# Patient Record
Sex: Female | Born: 1955 | Hispanic: No | Smoking: Former smoker
Health system: Southern US, Community
[De-identification: ages and names within clinical notes are randomized; demographics above are authoritative.]

## PROBLEM LIST (undated history)

## (undated) DIAGNOSIS — F32A Depression, unspecified: Secondary | ICD-10-CM

## (undated) DIAGNOSIS — R7303 Prediabetes: Secondary | ICD-10-CM

## (undated) DIAGNOSIS — E079 Disorder of thyroid, unspecified: Secondary | ICD-10-CM

## (undated) DIAGNOSIS — C50919 Malignant neoplasm of unspecified site of unspecified female breast: Secondary | ICD-10-CM

## (undated) DIAGNOSIS — F329 Major depressive disorder, single episode, unspecified: Secondary | ICD-10-CM

## (undated) DIAGNOSIS — K219 Gastro-esophageal reflux disease without esophagitis: Secondary | ICD-10-CM

## (undated) DIAGNOSIS — F419 Anxiety disorder, unspecified: Secondary | ICD-10-CM

## (undated) DIAGNOSIS — G709 Myoneural disorder, unspecified: Secondary | ICD-10-CM

## (undated) DIAGNOSIS — T7840XA Allergy, unspecified, initial encounter: Secondary | ICD-10-CM

## (undated) DIAGNOSIS — E7800 Pure hypercholesterolemia, unspecified: Secondary | ICD-10-CM

## (undated) HISTORY — DX: Myoneural disorder, unspecified: G70.9

## (undated) HISTORY — DX: Gastro-esophageal reflux disease without esophagitis: K21.9

## (undated) HISTORY — DX: Pure hypercholesterolemia, unspecified: E78.00

## (undated) HISTORY — DX: Allergy, unspecified, initial encounter: T78.40XA

## (undated) HISTORY — PX: LAPAROSCOPIC HYSTERECTOMY: SHX1926

## (undated) HISTORY — PX: ABDOMINAL HYSTERECTOMY: SHX81

## (undated) HISTORY — DX: Depression, unspecified: F32.A

## (undated) HISTORY — PX: HERNIA REPAIR: SHX51

## (undated) HISTORY — DX: Disorder of thyroid, unspecified: E07.9

## (undated) HISTORY — DX: Anxiety disorder, unspecified: F41.9

---
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
## 2016-10-07 LAB — HM MAMMOGRAPHY

## 2017-03-10 HISTORY — PX: LAPAROSCOPIC HYSTERECTOMY: SHX1926

## 2017-03-10 HISTORY — PX: ABDOMINAL HYSTERECTOMY: SHX81

## 2017-04-26 LAB — HM MAMMOGRAPHY

## 2017-06-08 HISTORY — PX: COLONOSCOPY: SHX174

## 2017-06-08 LAB — HM COLONOSCOPY

## 2017-10-20 ENCOUNTER — Encounter: Payer: Self-pay | Admitting: Family Medicine

## 2017-10-20 ENCOUNTER — Ambulatory Visit: Payer: BLUE CROSS/BLUE SHIELD | Admitting: Family Medicine

## 2017-10-20 VITALS — BP 110/64 | HR 74 | Temp 98.4°F | Resp 16 | Ht 65.0 in | Wt 152.0 lb

## 2017-10-20 DIAGNOSIS — Z8639 Personal history of other endocrine, nutritional and metabolic disease: Secondary | ICD-10-CM

## 2017-10-20 DIAGNOSIS — Z7689 Persons encountering health services in other specified circumstances: Secondary | ICD-10-CM | POA: Diagnosis not present

## 2017-10-20 DIAGNOSIS — R1031 Right lower quadrant pain: Secondary | ICD-10-CM | POA: Diagnosis not present

## 2017-10-20 LAB — POCT URINALYSIS DIP (PROADVANTAGE DEVICE)
Bilirubin, UA: NEGATIVE
Blood, UA: NEGATIVE
Glucose, UA: NEGATIVE mg/dL
Ketones, POC UA: NEGATIVE mg/dL
Leukocytes, UA: NEGATIVE
Nitrite, UA: NEGATIVE
Protein Ur, POC: NEGATIVE mg/dL
Specific Gravity, Urine: 1.03
Urobilinogen, Ur: NEGATIVE
pH, UA: 6 (ref 5.0–8.0)

## 2017-10-20 NOTE — Progress Notes (Signed)
Subjective:    Patient ID: Brittany SpillerStaci Ellers, female    DOB: June 20, 1955, 62 y.o.   MRN: [id number]  HPI Chief Complaint  Patient presents with  . right side    pelvic pain and radiates to the back. pain is on right jaw as well. been going on 3 weeks   She is new to the practice and here to establish care. Previous medical care: moved here 3 days ago from [place]. [facility] in [place], [place].   Had an OB/GYN in [place].  Has lived in [place] in the past.   CPE: November 2018   Complains of pain in her right lower quadrant for the past 3 weeks. Pain is constant but some days worse than others. Walking improves pain. States the pain feels like a "soreness" and occasionally radiates around to her right flank. Denies fever, chills, nausea, vomiting, diarrhea or constipation. No changes in bowel habits.  Nothing seems to aggravate her pain.  Aleve helps some.   After urinating she does not feel like she empties her bladder well.  No urinary frequency, urgency.   States her fear is that she has some type of lymph node cancer. She has been searching her symptoms on the internet.   States she has been told that she had hypoglycemia in the past, low blood sugar with fasting.   She has seen a neurologist in the past for toe numbness on left foot.   GERD- told she had this by an ENT, states she does not think she has this.  Complains of tonsil stones at times.   Taking nature thyroid 65 mg tablet daily. Prescription due to borderline low TSH.   Hysterectomy in 03/2017 for uterine polyps. Ovaries in tact.   Used to work for [employer]. Single. One daughter.   Reviewed allergies, medications, past medical, surgical, family, and social history.    Review of Systems Pertinent positives and negatives in the history of present illness.     Objective:   Physical Exam  Constitutional: She is oriented to person, place, and time. She appears well-developed and well-nourished. No  distress.  HENT:  Mouth/Throat: Oropharynx is clear and moist.  Eyes: Pupils are equal, round, and reactive to light. Conjunctivae are normal.  Cardiovascular: Normal rate, regular rhythm, normal heart sounds and intact distal pulses.  No LE edema   Pulmonary/Chest: Effort normal and breath sounds normal.  Abdominal: Soft. Bowel sounds are normal. She exhibits no distension and no mass. There is no hepatosplenomegaly. There is generalized tenderness. There is no rigidity, no rebound, no guarding, no CVA tenderness, no tenderness at McBurney's point and negative Murphy's sign.  Diffuse TTP to RUQ and bilateral lower abdomen without rebound or guarding   Negative psoas and obturator.   Lymphadenopathy:    She has no cervical adenopathy.    She has no axillary adenopathy.       Right: No inguinal and no supraclavicular adenopathy present.       Left: No inguinal and no supraclavicular adenopathy present.  Neurological: She is alert and oriented to person, place, and time. She has normal strength and normal reflexes. No cranial nerve deficit or sensory deficit. Gait normal.  Skin: Skin is warm and dry. Capillary refill takes less than 2 seconds. No rash noted. She is not diaphoretic. No pallor.  Psychiatric: She has a normal mood and affect. Her speech is normal and behavior is normal. Thought content normal.   BP 110/64   Pulse 74  Temp 98.4 F (36.9 C) (Oral)   Resp 16   Ht 5\' 5"  (1.651 m)   Wt 152 lb (68.9 kg)   SpO2 98%   BMI 25.29 kg/m       Assessment & Plan:  Right lower quadrant abdominal pain - Plan: CBC with Differential/Platelet, Comprehensive metabolic panel, POCT Urinalysis DIP (Proadvantage Device)  History of thyroid disorder  Encounter to establish care  Discussed that there is no obvious explanation for her symptoms. No sign of infection or systemic illness.  Urinalysis dipstick is negative.  Considered differentials including appendicitis, cholecystitis, UTI.    Recommend she call or return if any new or worsening symptoms. Will check labs and follow up.

## 2017-10-20 NOTE — Patient Instructions (Signed)
If your pain becomes more severe or if you develop fever, vomiting or any new symptoms then you should be seen again.  Ok to take Aleve occasionally for pain.   We will call you with your lab results.

## 2017-10-21 LAB — CBC WITH DIFFERENTIAL/PLATELET
Basophils Absolute: 0 10*3/uL (ref 0.0–0.2)
Basos: 0 %
EOS (ABSOLUTE): 0.1 10*3/uL (ref 0.0–0.4)
Eos: 1 %
Hematocrit: 40.9 % (ref 34.0–46.6)
Hemoglobin: 13.9 g/dL (ref 11.1–15.9)
Immature Grans (Abs): 0 10*3/uL (ref 0.0–0.1)
Immature Granulocytes: 0 %
Lymphocytes Absolute: 2.9 10*3/uL (ref 0.7–3.1)
Lymphs: 41 %
MCH: 32 pg (ref 26.6–33.0)
MCHC: 34 g/dL (ref 31.5–35.7)
MCV: 94 fL (ref 79–97)
Monocytes Absolute: 0.7 10*3/uL (ref 0.1–0.9)
Monocytes: 9 %
Neutrophils Absolute: 3.5 10*3/uL (ref 1.4–7.0)
Neutrophils: 49 %
Platelets: 345 10*3/uL (ref 150–450)
RBC: 4.34 x10E6/uL (ref 3.77–5.28)
RDW: 13.6 % (ref 12.3–15.4)
WBC: 7.1 10*3/uL (ref 3.4–10.8)

## 2017-10-21 LAB — COMPREHENSIVE METABOLIC PANEL
ALT: 16 IU/L (ref 0–32)
AST: 19 IU/L (ref 0–40)
Albumin/Globulin Ratio: 2 (ref 1.2–2.2)
Albumin: 4.6 g/dL (ref 3.6–4.8)
Alkaline Phosphatase: 78 IU/L (ref 39–117)
BUN/Creatinine Ratio: 14 (ref 12–28)
BUN: 13 mg/dL (ref 8–27)
Bilirubin Total: 0.6 mg/dL (ref 0.0–1.2)
CO2: 24 mmol/L (ref 20–29)
Calcium: 10 mg/dL (ref 8.7–10.3)
Chloride: 103 mmol/L (ref 96–106)
Creatinine, Ser: 0.95 mg/dL (ref 0.57–1.00)
GFR calc Af Amer: 75 mL/min/{1.73_m2} (ref >59)
GFR calc non Af Amer: 65 mL/min/{1.73_m2} (ref >59)
Globulin, Total: 2.3 g/dL (ref 1.5–4.5)
Glucose: 85 mg/dL (ref 65–99)
Potassium: 4.5 mmol/L (ref 3.5–5.2)
Sodium: 140 mmol/L (ref 134–144)
Total Protein: 6.9 g/dL (ref 6.0–8.5)

## 2017-10-26 ENCOUNTER — Telehealth: Payer: Self-pay | Admitting: Family Medicine

## 2017-10-26 NOTE — Telephone Encounter (Signed)
[facility]. This is a new pt whose first appt is 11/14/2017. Sending back for review.

## 2017-11-02 ENCOUNTER — Telehealth: Payer: Self-pay | Admitting: Family Medicine

## 2017-11-02 NOTE — Telephone Encounter (Signed)
Received requested records from [facility]. Sending back for review.

## 2017-11-14 ENCOUNTER — Encounter: Payer: Self-pay | Admitting: Family Medicine

## 2017-11-14 ENCOUNTER — Ambulatory Visit: Payer: BLUE CROSS/BLUE SHIELD | Admitting: Family Medicine

## 2017-11-14 VITALS — BP 128/80 | HR 72 | Ht 65.0 in | Wt 154.4 lb

## 2017-11-14 DIAGNOSIS — R7989 Other specified abnormal findings of blood chemistry: Secondary | ICD-10-CM

## 2017-11-14 DIAGNOSIS — R920 Mammographic microcalcification found on diagnostic imaging of breast: Secondary | ICD-10-CM | POA: Diagnosis not present

## 2017-11-14 DIAGNOSIS — E079 Disorder of thyroid, unspecified: Secondary | ICD-10-CM | POA: Diagnosis not present

## 2017-11-14 DIAGNOSIS — Z23 Encounter for immunization: Secondary | ICD-10-CM

## 2017-11-14 DIAGNOSIS — F419 Anxiety disorder, unspecified: Secondary | ICD-10-CM | POA: Diagnosis not present

## 2017-11-14 DIAGNOSIS — F329 Major depressive disorder, single episode, unspecified: Secondary | ICD-10-CM | POA: Diagnosis not present

## 2017-11-14 DIAGNOSIS — F32A Depression, unspecified: Secondary | ICD-10-CM

## 2017-11-14 HISTORY — DX: Disorder of thyroid, unspecified: E07.9

## 2017-11-14 NOTE — Patient Instructions (Signed)
You can call to schedule your appointment with the Counselor. A few offices are listed below for you to call.   Evette CristalKenneth Frazier, LPC [address] [place], [place] [address] [phone]  [facility] [address] [place], [place] [address] [phone]  [facility]  [address], [place], [place] [address]  Phone: [phone]  [facility] [address] [place], [place] [address]  Phone: [phone]  [facility]  [address], [place], [place] [address] Phone: [phone]

## 2017-11-14 NOTE — Progress Notes (Signed)
Subjective:    Patient ID: Brittany [name], female    DOB: [birth year], 62 y.o.   MRN: [id number]  HPI Chief Complaint  Patient presents with  . get established    get established.  aniexty issue    She is fairly new to the practice and here with complaints of worsening anxiety over the past year. 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 When not in the car her anxiety is manageable.  Denies history of trauma or MVA. She is not sure as to why this started happening.   Denies history of depression. Reports going through menopause, moving and losing her job all in the past few months.  In the past she took wellbutrin for what she recalls as "irritability" but this gave her hives. Denies any other psychotropic medications in the past.  Has not been to a therapist in the past. She is interested in CBT. She requests Luan MooreKenneth Frazier's information specifically.   Records received from [facility] in [place].   Low TSH in November 2018 per records. She is taking a thyroid supplement. Reports history of hypothyroidism since ?2013.    States she used to exercise more. This helps her mood usually.    She saw a neurologist in the past for toe numbness. States she had a glucose challenge and was told she was hypoglycemia. Her records actually indicate history of hyperglycemia and elevated Hgb A1c.   Hysterectomy in November 2018 for endometrial polyps and postmenopausal bleeding. Also diagnosed uterine fibriods and with cervical stenosis per records.  Periods stopped in her mid 5450s.   States she needs to have a diagnostic mammogram per breast center in [place]. She would like for me to order this.   She requests to have the shingles vaccine. Had the Zostavax vaccine years ago. States she does not need to check with her insurance company, she wants it even if it is hundreds of dollars.   Denies fever, chills, dizziness, chest pain, palpitations, shortness of  breath, abdominal pain, N/V/D, urinary symptoms, LE edema.    Depression screen [facility]9 11/14/2017 10/20/2017  Decreased Interest 3 3  Down, Depressed, Hopeless 3 3  PHQ - 2 Score 6 6  Altered sleeping 0 0  Tired, decreased energy 3 3  Change in appetite 1 0  Feeling bad or failure about yourself  1 0  Trouble concentrating 3 1  Moving slowly or fidgety/restless 1 0  Suicidal thoughts 0 0  PHQ-9 Score 15 10  Difficult doing work/chores Not difficult at all Not difficult at all   Reviewed allergies, medications, past medical, surgical, family, and social history.   Review of Systems Pertinent positives and negatives in the history of present illness.     Objective:   Physical Exam BP 128/80   Pulse 72   Ht 5\' 5"  (1.651 m)   Wt 154 lb 6.4 oz (70 kg)   BMI 25.69 kg/m   Alert and in no distress. Pharyngeal area is normal. Neck is supple without adenopathy or thyromegaly. Cardiac exam shows a regular sinus rhythm without murmurs or gallops. Lungs are clear to auscultation.      Assessment & Plan:  Anxiety and depression - Plan: TSH, T4, free  Abnormal mammogram with microcalcification - Plan: MM DIAG BREAST TOMO BILATERAL, CANCELED: MM Digital Diagnostic Bilat  Thyroid disorder - Plan: TSH, T4, free  Low TSH level - Plan: TSH, T4, free  Need for shingles  vaccine - Plan: Varicella-zoster vaccine IM  She appears to have worsening anxiety and depression.  Declines medication.  She is interested in starting therapy.  A list of names and numbers were given to her.  She does not appear to be in any danger to herself or others. History of hypothyroidism with a low TSH per lab results from [place].  She is currently taking a thyroid hormone supplement.  Check TSH and free T4 today. Counseling done on shingles vaccine.  Recommend that she call and check with her insurance company regarding her financial responsibility.  She declines to call the insurance company and states she wants  the vaccine regardless of the expense.  Shingrix was given today. She will be due for a fasting CPE in mid to late November per patient.  At that time we will check a hemoglobin A1c due to history of elevated A1c per records.

## 2017-11-15 ENCOUNTER — Encounter: Payer: Self-pay | Admitting: Family Medicine

## 2017-11-15 LAB — TSH: TSH: 1.04 u[IU]/mL (ref 0.450–4.500)

## 2017-11-15 LAB — T4, FREE: Free T4: 1.15 ng/dL (ref 0.82–1.77)

## 2017-11-21 ENCOUNTER — Encounter: Payer: Self-pay | Admitting: Family Medicine

## 2017-11-23 ENCOUNTER — Encounter: Payer: Self-pay | Admitting: Family Medicine

## 2017-12-19 DIAGNOSIS — F4322 Adjustment disorder with anxiety: Secondary | ICD-10-CM | POA: Diagnosis not present

## 2017-12-22 ENCOUNTER — Encounter: Payer: Self-pay | Admitting: Family Medicine

## 2017-12-22 ENCOUNTER — Telehealth: Payer: Self-pay | Admitting: Family Medicine

## 2017-12-22 ENCOUNTER — Ambulatory Visit: Payer: BLUE CROSS/BLUE SHIELD | Admitting: Family Medicine

## 2017-12-22 VITALS — BP 120/80 | HR 71 | Wt 156.0 lb

## 2017-12-22 DIAGNOSIS — F329 Major depressive disorder, single episode, unspecified: Secondary | ICD-10-CM

## 2017-12-22 DIAGNOSIS — F419 Anxiety disorder, unspecified: Secondary | ICD-10-CM

## 2017-12-22 DIAGNOSIS — F32A Depression, unspecified: Secondary | ICD-10-CM

## 2017-12-22 MED ORDER — FLUOXETINE HCL 20 MG PO TABS: 20.0000 mg | ORAL_TABLET | Freq: Every day | ORAL | 1 refills | Status: DC

## 2017-12-22 NOTE — Telephone Encounter (Signed)
Thanks

## 2017-12-22 NOTE — Patient Instructions (Addendum)
Start daily Prozac 10 mg x one week. Then if doing well, increase to 20 mg daily.  Return in 2 weeks for follow up.     Fluoxetine capsules or tablets (Depression/Mood Disorders) What is this medicine? FLUOXETINE (floo OX e teen) belongs to a class of drugs known as selective serotonin reuptake inhibitors (SSRIs). It helps to treat mood problems such as depression, obsessive compulsive disorder, and panic attacks. It can also treat certain eating disorders. This medicine may be used for other purposes; ask your health care provider or pharmacist if you have questions. COMMON BRAND NAME(S): Prozac What should I tell my health care provider before I take this medicine? They need to know if you have any of these conditions: -bipolar disorder or a family history of bipolar disorder -bleeding disorders -glaucoma -heart disease -liver disease -low levels of sodium in the blood -seizures -suicidal thoughts, plans, or attempt; a previous suicide attempt by you or a family member -take MAOIs like Carbex, Eldepryl, Marplan, Nardil, and Parnate -take medicines that treat or prevent blood clots -thyroid disease -an unusual or allergic reaction to fluoxetine, other medicines, foods, dyes, or preservatives -pregnant or trying to get pregnant -breast-feeding How should I use this medicine? Take this medicine by mouth with a glass of water. Follow the directions on the prescription label. You can take this medicine with or without food. Take your medicine at regular intervals. Do not take it more often than directed. Do not stop taking this medicine suddenly except upon the advice of your doctor. Stopping this medicine too quickly may cause serious side effects or your condition may worsen. A special MedGuide will be given to you by the pharmacist with each prescription and refill. Be sure to read this information carefully each time. Talk to your pediatrician regarding the use of this medicine in  children. While this drug may be prescribed for children as young as 7 years for selected conditions, precautions do apply. Overdosage: If you think you have taken too much of this medicine contact a poison control center or emergency room at once. NOTE: This medicine is only for you. Do not share this medicine with others. What if I miss a dose? If you miss a dose, skip the missed dose and go back to your regular dosing schedule. Do not take double or extra doses. What may interact with this medicine? Do not take this medicine with any of the following medications: -other medicines containing fluoxetine, like Sarafem or Symbyax -cisapride -linezolid -MAOIs like Carbex, Eldepryl, Marplan, Nardil, and Parnate -methylene blue (injected into a vein) -pimozide -thioridazine This medicine may also interact with the following medications: -alcohol -amphetamines -aspirin and aspirin-like medicines -carbamazepine -certain medicines for depression, anxiety, or psychotic disturbances -certain medicines for migraine headaches like almotriptan, eletriptan, frovatriptan, naratriptan, rizatriptan, sumatriptan, zolmitriptan -digoxin -diuretics -fentanyl -flecainide -furazolidone -isoniazid -lithium -medicines for sleep -medicines that treat or prevent blood clots like warfarin, enoxaparin, and dalteparin -NSAIDs, medicines for pain and inflammation, like ibuprofen or naproxen -phenytoin -procarbazine -propafenone -rasagiline -ritonavir -supplements like St. John's wort, kava kava, valerian -tramadol -tryptophan -vinblastine This list may not describe all possible interactions. Give your health care provider a list of all the medicines, herbs, non-prescription drugs, or dietary supplements you use. Also tell them if you smoke, drink alcohol, or use illegal drugs. Some items may interact with your medicine. What should I watch for while using this medicine? Tell your doctor if your symptoms  do not get better or if they get  worse. Visit your doctor or health care professional for regular checks on your progress. Because it may take several weeks to see the full effects of this medicine, it is important to continue your treatment as prescribed by your doctor. Patients and their families should watch out for new or worsening thoughts of suicide or depression. Also watch out for sudden changes in feelings such as feeling anxious, agitated, panicky, irritable, hostile, aggressive, impulsive, severely restless, overly excited and hyperactive, or not being able to sleep. If this happens, especially at the beginning of treatment or after a change in dose, call your health care professional. Bonita QuinYou may get drowsy or dizzy. Do not drive, use machinery, or do anything that needs mental alertness until you know how this medicine affects you. Do not stand or sit up quickly, especially if you are an older patient. This reduces the risk of dizzy or fainting spells. Alcohol may interfere with the effect of this medicine. Avoid alcoholic drinks. Your mouth may get dry. Chewing sugarless gum or sucking hard candy, and drinking plenty of water may help. Contact your doctor if the problem does not go away or is severe. This medicine may affect blood sugar levels. If you have diabetes, check with your doctor or health care professional before you change your diet or the dose of your diabetic medicine. What side effects may I notice from receiving this medicine? Side effects that you should report to your doctor or health care professional as soon as possible: -allergic reactions like skin rash, itching or hives, swelling of the face, lips, or tongue -anxious -black, tarry stools -breathing problems -changes in vision -confusion -elevated mood, decreased need for sleep, racing thoughts, impulsive behavior -eye pain -fast, irregular heartbeat -feeling faint or lightheaded, falls -feeling agitated, angry, or  irritable -hallucination, loss of contact with reality -loss of balance or coordination -loss of memory -painful or prolonged erections -restlessness, pacing, inability to keep still -seizures -stiff muscles -suicidal thoughts or other mood changes -trouble sleeping -unusual bleeding or bruising -unusually weak or tired -vomiting Side effects that usually do not require medical attention (report to your doctor or health care professional if they continue or are bothersome): -change in appetite or weight -change in sex drive or performance -diarrhea -dry mouth -headache -increased sweating -nausea -tremors This list may not describe all possible side effects. Call your doctor for medical advice about side effects. You may report side effects to FDA at 1-800-FDA-1088. Where should I keep my medicine? Keep out of the reach of children. Store at room temperature between 15 and 30 degrees C (59 and 86 degrees F). Throw away any unused medicine after the expiration date. NOTE: This sheet is a summary. It may not cover all possible information. If you have questions about this medicine, talk to your doctor, pharmacist, or health care provider.  2018 Elsevier/Gold Standard (2015-09-27 15:55:27)

## 2017-12-22 NOTE — Telephone Encounter (Signed)
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 Called her back this afternoon, she has sent them out to be mailed.  She said it would prob be middle of next week before we received them  [facility]  Called pt reached voice mail. Left details of above.  I will call patient once I have them in my hands.

## 2017-12-22 NOTE — Progress Notes (Signed)
   Subjective:    Patient ID: Brittany SpillerStaci [name], female    DOB: 27-Jul-1955, 62 y.o.   MRN: [id number]  HPI Chief Complaint  Patient presents with  . prozac    saw therapist monday and recommends getting on prozac 20mg    She is here requesting medication for anxiety and depression. She has been hesitant to start on medication for this.  She recently saw Evette CristalKenneth Frazier, therapist, for her symptoms and he recommends she start on Prozac. She is following up with him in 2 weeks.  Denies SI or HI.   Denies fever, chills, chest pain, shortness of breath, abdominal pain, N/V/D.   Reviewed allergies, medications, past medical, surgical, family, and social history.   Review of Systems Pertinent positives and negatives in the history of present illness.     Objective:   Physical Exam BP 120/80   Pulse 71   Wt 156 lb (70.8 kg)   BMI 25.96 kg/m   Alert and oriented and in no acute distress.  Not otherwise examined.      Assessment & Plan:  Anxiety and depression - Plan: FLUoxetine (PROZAC) 20 MG tablet  Discussed potential side effects and interactions with medications and supplements associated with Prozac. Advised her to avoid taking any new OTC herbs or supplements without first checking with her pharmacist. She will start on 10 mg x 1 week and if doing well increase to 20 mg. Follow up with me in 2 weeks. Continue seeing her counselor.

## 2017-12-28 ENCOUNTER — Telehealth: Payer: Self-pay | Admitting: Family Medicine

## 2017-12-28 ENCOUNTER — Encounter: Payer: Self-pay | Admitting: Family Medicine

## 2017-12-28 NOTE — Telephone Encounter (Signed)
Called pt reached voice mail lmtrc.  We have received films and reports from [facility]. Patient needs to pick up.

## 2017-12-30 ENCOUNTER — Encounter: Payer: Self-pay | Admitting: Family Medicine

## 2018-01-04 ENCOUNTER — Ambulatory Visit
Admission: RE | Admit: 2018-01-04 | Discharge: 2018-01-04 | Disposition: A | Discharge status: Home / Self Care | Payer: BLUE CROSS/BLUE SHIELD | Source: Ambulatory Visit | Attending: Family Medicine | Admitting: Family Medicine

## 2018-01-04 DIAGNOSIS — R921 Mammographic calcification found on diagnostic imaging of breast: Secondary | ICD-10-CM | POA: Diagnosis not present

## 2018-01-04 DIAGNOSIS — R920 Mammographic microcalcification found on diagnostic imaging of breast: Secondary | ICD-10-CM

## 2018-01-05 ENCOUNTER — Encounter: Payer: Self-pay | Admitting: Family Medicine

## 2018-01-05 ENCOUNTER — Ambulatory Visit: Payer: BLUE CROSS/BLUE SHIELD | Admitting: Family Medicine

## 2018-01-05 VITALS — BP 120/78 | HR 63 | Wt 155.6 lb

## 2018-01-05 DIAGNOSIS — F419 Anxiety disorder, unspecified: Secondary | ICD-10-CM

## 2018-01-05 DIAGNOSIS — F32A Depression, unspecified: Secondary | ICD-10-CM

## 2018-01-05 DIAGNOSIS — F329 Major depressive disorder, single episode, unspecified: Secondary | ICD-10-CM

## 2018-01-05 DIAGNOSIS — F4322 Adjustment disorder with anxiety: Secondary | ICD-10-CM | POA: Diagnosis not present

## 2018-01-05 NOTE — Progress Notes (Signed)
   Subjective:    Patient ID: Brittany SpillerStaci [name], female    DOB: 01-17-1956, 62 y.o.   MRN: [id number]  HPI Chief Complaint  Patient presents with  . 2 week follow-up    2 week follow-up. haven't had any problems. doing well.    She is here to follow-up on anxiety and depression and starting on new medication Prozac 2 weeks ago. Reports doing well on the medication.  She is now on the 20 mg daily dosing.  States she has noticed that her thoughts are not racing and she is able to focus more. she is seeing her counselor later today Feels as though she is benefiting from the medication and would like to stay on it.  She did have a rash last week that presented after doing hot yoga so she took antihistamines for a couple of days and the rash resolved. Denies fever, chills, headache, chest pain, abdominal pain, nausea, vomiting, diarrhea.  No insomnia.  Appetite is good.  Reviewed allergies, medications, past medical, surgical, family, and social history.   Review of Systems Pertinent positives and negatives in the history of present illness.     Objective:   Physical Exam BP 120/78   Pulse 63   Wt 155 lb 9.6 oz (70.6 kg)   BMI 25.89 kg/m   Alert and oriented and in no acute distress.  Not otherwise examined.      Assessment & Plan:  Anxiety and depression  Appears to be benefiting from Prozac and we will keep her on this at the 20 mg dose.  No obvious side effects.  She will continue seeing her counselor.

## 2018-01-16 ENCOUNTER — Other Ambulatory Visit (INDEPENDENT_AMBULATORY_CARE_PROVIDER_SITE_OTHER): Payer: BLUE CROSS/BLUE SHIELD

## 2018-01-16 DIAGNOSIS — Z23 Encounter for immunization: Secondary | ICD-10-CM | POA: Diagnosis not present

## 2018-02-06 DIAGNOSIS — F4322 Adjustment disorder with anxiety: Secondary | ICD-10-CM | POA: Diagnosis not present

## 2018-03-02 ENCOUNTER — Ambulatory Visit: Payer: BLUE CROSS/BLUE SHIELD | Admitting: Family Medicine

## 2018-03-02 ENCOUNTER — Encounter: Payer: Self-pay | Admitting: Family Medicine

## 2018-03-02 VITALS — BP 102/70 | HR 59 | Temp 98.3°F | Resp 16 | Wt 155.4 lb

## 2018-03-02 DIAGNOSIS — H1033 Unspecified acute conjunctivitis, bilateral: Secondary | ICD-10-CM | POA: Diagnosis not present

## 2018-03-02 MED ORDER — POLYMYXIN B-TRIMETHOPRIM 10000-0.1 UNIT/ML-% OP SOLN: 1.0000 [drp] | OPHTHALMIC | 0 refills | Status: DC

## 2018-03-02 NOTE — Patient Instructions (Signed)

## 2018-03-02 NOTE — Progress Notes (Signed)
Subjective:  Brittany [name] is a 62 y.o. female who presents for a 3-day history of red eyes, itching and watery discharge with some mild left upper and lower lid tenderness.  Denies eye pain, vision changes or foreign body sensation.  Denies fever, chills, headache, dizziness, rhinorrhea, nasal congestion, sore throat. She does not wear contact lenses  Treatment to date: saline eye drops.  Denies sick contacts.  No other aggravating or relieving factors.  No other c/o.  ROS as in subjective.   Objective: Vitals:   03/02/18 1331  BP: 102/70  Pulse: (!) 59  Resp: 16  Temp: 98.3 F (36.8 C)  SpO2: 98%    General appearance: Alert, WD/WN, no distress, mildly ill appearing                             Skin: warm, no rash                           Head: no sinus tenderness                            Eyes: conjunctiva red L>R, clear drainage bilaterally, mild lid edema, corneas clear, PERRLA, EOMs intact                            Ears: pearly TMs, external ear canals normal                          Nose: septum midline, normal nose, no discharge             Mouth/throat: MMM, tongue normal, pharyngeal normal                           Neck: supple, no adenopathy, no thyromegaly, nontender                                Assessment: Acute conjunctivitis of both eyes, unspecified acute conjunctivitis type - Plan: trimethoprim-polymyxin b (POLYTRIM) ophthalmic solution    Plan: She was seen for bilateral conjunctivitis and we will treat with Polytrim.  No red flag symptoms.  Discussed using cool or warm compresses and doing a lot of handwashing.  Discussed the contagious nature.  She does not wear contact lenses.  Advised her to throw away her eye make-up and buy new items.  Follow-up if symptoms are worsening or not back to baseline in 1 week.

## 2018-03-16 DIAGNOSIS — F4322 Adjustment disorder with anxiety: Secondary | ICD-10-CM | POA: Diagnosis not present

## 2018-03-22 ENCOUNTER — Encounter: Payer: Self-pay | Admitting: Family Medicine

## 2018-03-22 ENCOUNTER — Ambulatory Visit (INDEPENDENT_AMBULATORY_CARE_PROVIDER_SITE_OTHER): Payer: BLUE CROSS/BLUE SHIELD | Admitting: Family Medicine

## 2018-03-22 VITALS — BP 110/70 | HR 54 | Ht 65.0 in | Wt 153.8 lb

## 2018-03-22 DIAGNOSIS — F32A Depression, unspecified: Secondary | ICD-10-CM

## 2018-03-22 DIAGNOSIS — F329 Major depressive disorder, single episode, unspecified: Secondary | ICD-10-CM

## 2018-03-22 DIAGNOSIS — Z13220 Encounter for screening for lipoid disorders: Secondary | ICD-10-CM | POA: Diagnosis not present

## 2018-03-22 DIAGNOSIS — F419 Anxiety disorder, unspecified: Secondary | ICD-10-CM | POA: Diagnosis not present

## 2018-03-22 DIAGNOSIS — Z0000 Encounter for general adult medical examination without abnormal findings: Secondary | ICD-10-CM | POA: Diagnosis not present

## 2018-03-22 DIAGNOSIS — L989 Disorder of the skin and subcutaneous tissue, unspecified: Secondary | ICD-10-CM

## 2018-03-22 LAB — POCT URINALYSIS DIP (PROADVANTAGE DEVICE)
Bilirubin, UA: NEGATIVE
Blood, UA: NEGATIVE
Glucose, UA: NEGATIVE mg/dL
Ketones, POC UA: NEGATIVE mg/dL
Leukocytes, UA: NEGATIVE
Nitrite, UA: NEGATIVE
Protein Ur, POC: NEGATIVE mg/dL
Specific Gravity, Urine: 1.01
Urobilinogen, Ur: NEGATIVE
pH, UA: 7.5 (ref 5.0–8.0)

## 2018-03-22 LAB — LIPID PANEL
Chol/HDL Ratio: 3 ratio (ref 0.0–4.4)
Cholesterol, Total: 206 mg/dL — ABNORMAL HIGH (ref 100–199)
HDL: 69 mg/dL (ref >39)
LDL Calculated: 109 mg/dL — ABNORMAL HIGH (ref 0–99)
Triglycerides: 140 mg/dL (ref 0–149)
VLDL Cholesterol Cal: 28 mg/dL (ref 5–40)

## 2018-03-22 NOTE — Addendum Note (Signed)
Addended by: Herminio CommonsJOHNSON, SABRINA A on: 03/22/2018 10:40 AM   Modules accepted: Orders

## 2018-03-22 NOTE — Progress Notes (Signed)
Subjective:    Patient ID: Brittany [name], female    DOB: 1955/05/14, 62 y.o.   MRN: [id number]  HPI Chief Complaint  Patient presents with  . cpe    cpe, had coffee with creme this morning.    She is here for a complete physical exam. Previous medical care: moved from [place] in June 2019 Last CPE: November 2018  Right inner thigh with a red small flat area non pruritic for the past 2 months.   Taking nature thyroid 65 mg tablet daily. Prescription due to borderline low TSH.   Hysterectomy in 03/2017 for uterine polyps. Ovaries in tact.  Used to work for [employer]. Single. One daughter.   Social history: Lives alone, not working currently  Drinks alcohol once weekly. No drug use. Smoked for 5 years in her early 32s.  Diet: fairly unhealthy now  Excerise: 3-4 days per week   Immunizations: UTD   Health maintenance:  Mammogram: 12/2017 Colonoscopy: 05/2017 Last Gynecological Exam:  Last Menstrual cycle: hysterectomy  Last Dental Exam: twice annually  Last Eye Exam: overdue   Hep C and HIV both negative   Wears seatbelt always, uses sunscreen, smoke detectors in home and functioning, does not text while driving and feels safe in home environment.   Reviewed allergies, medications, past medical, surgical, family, and social history.    Review of Systems Review of Systems Constitutional: -fever, -chills, -sweats, -unexpected weight change,-fatigue ENT: -runny nose, -ear pain, -sore throat Cardiology:  -chest pain, -palpitations, -edema Respiratory: -cough, -shortness of breath, -wheezing Gastroenterology: -abdominal pain, -nausea, -vomiting, -diarrhea, -constipation  Hematology: -bleeding or bruising problems Musculoskeletal: -arthralgias, -myalgias, -joint swelling, -back pain Ophthalmology: -vision changes Urology: -dysuria, -difficulty urinating, -hematuria, -urinary frequency, -urgency Neurology: -headache, -weakness, -tingling, -numbness         Objective:   Physical Exam BP 110/70   Pulse (!) 54   Ht 5\' 5"  (1.651 m)   Wt 153 lb 12.8 oz (69.8 kg)   SpO2 97%   BMI 25.59 kg/m   General Appearance:    Alert, cooperative, no distress, appears stated age  Head:    Normocephalic, without obvious abnormality, atraumatic  Eyes:    PERRL, conjunctiva/corneas clear, EOM's intact, fundi    benign  Ears:    Normal TM's and external ear canals  Nose:   Nares normal, mucosa normal, no drainage or sinus   tenderness  Throat:   Lips, mucosa, and tongue normal; teeth and gums normal  Neck:   Supple, no lymphadenopathy;  thyroid:  no   enlargement/tenderness/nodules; no carotid   bruit or JVD  Back:    Spine nontender, no curvature, ROM normal, no CVA     tenderness  Lungs:     Clear to auscultation bilaterally without wheezes, rales or     ronchi; respirations unlabored  Chest Wall:    No tenderness or deformity   Heart:    Regular rate and rhythm, S1 and S2 normal, no murmur, rub   or gallop  Breast Exam:    Declines. Mammogram UTD  Abdomen:     Soft, non-tender, nondistended, normoactive bowel sounds,    no masses, no hepatosplenomegaly  Genitalia:   declines. Hysterectomy   Rectal:   not done   Extremities:   No clubbing, cyanosis or edema  Pulses:   2+ and symmetric all extremities  Skin:   Skin color, texture, turgor normal, no rashes. 1 cm round area of erythema, mildly raised without fluctuance   Lymph nodes:  Cervical, supraclavicular, and axillary nodes normal  Neurologic:   CNII-XII intact, normal strength, sensation and gait; reflexes 2+ and symmetric throughout          Psych:   Normal mood, affect, hygiene and grooming.     Urinalysis dipstick: negative       Assessment & Plan:  Routine general medical examination at a health care facility - Plan: Lipid panel  Anxiety and depression  Skin lesion of right leg  Screening for lipid disorders - Plan: Lipid panel  She appears to be doing well physically and  reports her mood is much improved with Prozac. She would like to continue on this.  Skin lesion is benign appearing. She may try hydrocortisone and follow up if she notices the area changing or enlarging.  Counseling done on healthy diet and exercise.  Immunizations are UTD.  Reports having a negative HIV and hepatitis C screening test at [place] previous PCP in [place]. Results not available currently.  Mammogram and colonoscopy UTD.  Follow up pending lipid panel.

## 2018-03-22 NOTE — Patient Instructions (Signed)
Here is a list of Eye Doctors that you call and schedule an appointment with.   [facility] Address: [address] [place], [place] [address] Phone: [phone]   [facility] Address: [address], [place], [place] [address] Phone: [phone]  Dr. Dione Booze Address: [address] [place], [place] [address] Phone: [phone]  [facility] [address] [place], [place]  [address] Telephone: [phone]  Preventative Care for Adults - Female      MAINTAIN REGULAR HEALTH EXAMS:  A routine yearly physical is a good way to check in with your primary care provider about your health and preventive screening. It is also an opportunity to share updates about your health and any concerns you have, and receive a thorough all-over exam.   Most health insurance companies pay for at least some preventative services.  Check with your health plan for specific coverages.  WHAT PREVENTATIVE SERVICES DO WOMEN NEED?  Adult women should have their weight and blood pressure checked regularly.   Women age 84 and older should have their cholesterol levels checked regularly.  Women should be screened for cervical cancer with a Pap smear and pelvic exam beginning at age 63.  Breast cancer screening generally begins at age 65 with a mammogram and breast exam by your primary care provider.    Beginning at age 45 and continuing to age 39, women should be screened for colorectal cancer.  Certain people may need continued testing until age 52.  Updating vaccinations is part of preventative care.  Vaccinations help protect against diseases such as the flu.  Osteoporosis is a disease in which the bones lose minerals and strength as we age. Women ages 54 and over should discuss this with their caregivers, as should women after menopause who have other risk factors.  Lab tests are generally done as part of preventative care to screen for anemia and blood disorders,  to screen for problems with the kidneys and liver, to screen for bladder problems, to check blood sugar, and to check your cholesterol level.  Preventative services generally include counseling about diet, exercise, avoiding tobacco, drugs, excessive alcohol consumption, and sexually transmitted infections.    GENERAL RECOMMENDATIONS FOR GOOD HEALTH:  Healthy diet:  Eat a variety of foods, including fruit, vegetables, animal or vegetable protein, such as meat, fish, chicken, and eggs, or beans, lentils, tofu, and grains, such as rice.  Drink plenty of water daily.  Decrease saturated fat in the diet, avoid lots of red meat, processed foods, sweets, fast foods, and fried foods.  Exercise:  Aerobic exercise helps maintain good heart health. At least 30-40 minutes of moderate-intensity exercise is recommended. For example, a brisk walk that increases your heart rate and breathing. This should be done on most days of the week.   Find a type of exercise or a variety of exercises that you enjoy so that it becomes a part of your daily life.  Examples are running, walking, swimming, water aerobics, and biking.  For motivation and support, explore group exercise such as aerobic class, spin class, Zumba, Yoga,or  martial arts, etc.    Set exercise goals for yourself, such as a certain weight goal, walk or run in a race such as a 5k walk/run.  Speak to your primary care provider about exercise goals.  Disease prevention:  If you smoke or chew tobacco, find out from your caregiver how to quit. It can literally save your  life, no matter how long you have been a tobacco user. If you do not use tobacco, never begin.   Maintain a healthy diet and normal weight. Increased weight leads to problems with blood pressure and diabetes.   The Body Mass Index or BMI is a way of measuring how much of your body is fat. Having a BMI above 27 increases the risk of heart disease, diabetes, hypertension, stroke and  other problems related to obesity. Your caregiver can help determine your BMI and based on it develop an exercise and dietary program to help you achieve or maintain this important measurement at a healthful level.  High blood pressure causes heart and blood vessel problems.  Persistent high blood pressure should be treated with medicine if weight loss and exercise do not work.   Fat and cholesterol leaves deposits in your arteries that can block them. This causes heart disease and vessel disease elsewhere in your body.  If your cholesterol is found to be high, or if you have heart disease or certain other medical conditions, then you may need to have your cholesterol monitored frequently and be treated with medication.   Ask if you should have a cardiac stress test if your history suggests this. A stress test is a test done on a treadmill that looks for heart disease. This test can find disease prior to there being a problem.  Menopause can be associated with physical symptoms and risks. Hormone replacement therapy is available to decrease these. You should talk to your caregiver about whether starting or continuing to take hormones is right for you.   Osteoporosis is a disease in which the bones lose minerals and strength as we age. This can result in serious bone fractures. Risk of osteoporosis can be identified using a bone density scan. Women ages 53 and over should discuss this with their caregivers, as should women after menopause who have other risk factors. Ask your caregiver whether you should be taking a calcium supplement and Vitamin D, to reduce the rate of osteoporosis.   Avoid drinking alcohol in excess (more than two drinks per day).  Avoid use of street drugs. Do not share needles with anyone. Ask for professional help if you need assistance or instructions on stopping the use of alcohol, cigarettes, and/or drugs.  Brush your teeth twice a day with fluoride toothpaste, and floss once a  day. Good oral hygiene prevents tooth decay and gum disease. The problems can be painful, unattractive, and can cause other health problems. Visit your dentist for a routine oral and dental check up and preventive care every 6-12 months.   Look at your skin regularly.  Use a mirror to look at your back. Notify your caregivers of changes in moles, especially if there are changes in shapes, colors, a size larger than a pencil eraser, an irregular border, or development of new moles.  Safety:  Use seatbelts 100% of the time, whether driving or as a passenger.  Use safety devices such as hearing protection if you work in environments with loud noise or significant background noise.  Use safety glasses when doing any work that could send debris in to the eyes.  Use a helmet if you ride a bike or motorcycle.  Use appropriate safety gear for contact sports.  Talk to your caregiver about gun safety.  Use sunscreen with a SPF (or skin protection factor) of 15 or greater.  Lighter skinned people are at a greater risk of skin cancer. Don't  forget to also wear sunglasses in order to protect your eyes from too much damaging sunlight. Damaging sunlight can accelerate cataract formation.   Practice safe sex. Use condoms. Condoms are used for birth control and to help reduce the spread of sexually transmitted infections (or STIs).  Some of the STIs are gonorrhea (the clap), chlamydia, syphilis, trichomonas, herpes, HPV (human papilloma virus) and HIV (human immunodeficiency virus) which causes AIDS. The herpes, HIV and HPV are viral illnesses that have no cure. These can result in disability, cancer and death.   Keep carbon monoxide and smoke detectors in your home functioning at all times. Change the batteries every 6 months or use a model that plugs into the wall.   Vaccinations:  Stay up to date with your tetanus shots and other required immunizations. You should have a booster for tetanus every 10 years. Be sure  to get your flu shot every year, since 5%-20% of the [place] population comes down with the flu. The flu vaccine changes each year, so being vaccinated once is not enough. Get your shot in the fall, before the flu season peaks.   Other vaccines to consider:  Human Papilloma Virus or HPV causes cancer of the cervix, and other infections that can be transmitted from person to person. There is a vaccine for HPV, and females should get immunized between the ages of 9011 and 6326. It requires a series of 3 shots.   Pneumococcal vaccine to protect against certain types of pneumonia.  This is normally recommended for adults age 60 or older.  However, adults younger than 60 years old with certain underlying conditions such as diabetes, heart or lung disease should also receive the vaccine.  Shingles vaccine to protect against Varicella Zoster if you are older than age 60, or younger than 60 years old with certain underlying illness.  Hepatitis A vaccine to protect against a form of infection of the liver by a virus acquired from food.  Hepatitis B vaccine to protect against a form of infection of the liver by a virus acquired from blood or body fluids, particularly if you work in health care.  If you plan to travel internationally, check with your local health department for specific vaccination recommendations.  Cancer Screening:  Breast cancer screening is essential to preventive care for women. All women age 60 and older should perform a breast self-exam every month. At age 60 and older, women should have their caregiver complete a breast exam each year. Women at ages 5440 and older should have a mammogram (x-ray film) of the breasts. Your caregiver can discuss how often you need mammograms.    Cervical cancer screening includes taking a Pap smear (sample of cells examined under a microscope) from the cervix (end of the uterus). It also includes testing for HPV (Human Papilloma Virus, which can cause cervical  cancer). Screening and a pelvic exam should begin at age 60, or 3 years after a woman becomes sexually active. Screening should occur every year, with a Pap smear but no HPV testing, up to age 930. After age 60, you should have a Pap smear every 3 years with HPV testing, if no HPV was found previously.   Most routine colon cancer screening begins at the age of 60. On a yearly basis, doctors may provide special easy to use take-home tests to check for hidden blood in the stool. Sigmoidoscopy or colonoscopy can detect the earliest forms of colon cancer and is life saving. These tests use  a small camera at the end of a tube to directly examine the colon. Speak to your caregiver about this at age 90, when routine screening begins (and is repeated every 5 years unless early forms of pre-cancerous polyps or small growths are found).

## 2018-04-03 ENCOUNTER — Other Ambulatory Visit: Payer: Self-pay

## 2018-04-03 MED ORDER — FLUOXETINE HCL 20 MG PO TABS: 20.0000 mg | ORAL_TABLET | Freq: Every day | ORAL | 1 refills | Status: DC

## 2018-04-03 NOTE — Telephone Encounter (Signed)
Patient lmom stating she needs a refill on the pending medication.

## 2018-04-03 NOTE — Telephone Encounter (Signed)
Patient called back and stated [facility] Please advise change.

## 2018-04-03 NOTE — Telephone Encounter (Signed)
Please take care of this.  

## 2018-04-05 DIAGNOSIS — H01021 Squamous blepharitis right upper eyelid: Secondary | ICD-10-CM | POA: Diagnosis not present

## 2018-04-05 DIAGNOSIS — H01022 Squamous blepharitis right lower eyelid: Secondary | ICD-10-CM | POA: Diagnosis not present

## 2018-04-05 DIAGNOSIS — H2513 Age-related nuclear cataract, bilateral: Secondary | ICD-10-CM | POA: Diagnosis not present

## 2018-04-05 DIAGNOSIS — H10413 Chronic giant papillary conjunctivitis, bilateral: Secondary | ICD-10-CM | POA: Diagnosis not present

## 2018-04-05 MED ORDER — FLUOXETINE HCL 20 MG PO TABS: 20.0000 mg | ORAL_TABLET | Freq: Every day | ORAL | 1 refills | Status: DC

## 2018-04-05 NOTE — Addendum Note (Signed)
Addended by: Victorio PalmJENKINS, Eithel Ryall on: 04/05/2018 09:55 AM   Modules accepted: Orders

## 2018-04-05 NOTE — Telephone Encounter (Signed)
[facility].

## 2018-04-24 DIAGNOSIS — F4322 Adjustment disorder with anxiety: Secondary | ICD-10-CM | POA: Diagnosis not present

## 2018-05-31 DIAGNOSIS — F4322 Adjustment disorder with anxiety: Secondary | ICD-10-CM | POA: Diagnosis not present

## 2018-06-30 DIAGNOSIS — F4322 Adjustment disorder with anxiety: Secondary | ICD-10-CM | POA: Diagnosis not present

## 2018-08-11 DIAGNOSIS — F4322 Adjustment disorder with anxiety: Secondary | ICD-10-CM | POA: Diagnosis not present

## 2018-09-15 DIAGNOSIS — F4322 Adjustment disorder with anxiety: Secondary | ICD-10-CM | POA: Diagnosis not present

## 2018-10-13 DIAGNOSIS — F4322 Adjustment disorder with anxiety: Secondary | ICD-10-CM | POA: Diagnosis not present

## 2018-11-30 ENCOUNTER — Other Ambulatory Visit: Payer: Self-pay | Admitting: Family Medicine

## 2018-12-01 ENCOUNTER — Telehealth: Payer: Self-pay | Admitting: Family Medicine

## 2018-12-01 DIAGNOSIS — R920 Mammographic microcalcification found on diagnostic imaging of breast: Secondary | ICD-10-CM

## 2018-12-01 DIAGNOSIS — Z1231 Encounter for screening mammogram for malignant neoplasm of breast: Secondary | ICD-10-CM

## 2018-12-01 NOTE — Telephone Encounter (Signed)
Pt called and requested orders be sent for mammogram to the breast center. Pt can be reached at [phone].

## 2018-12-04 NOTE — Telephone Encounter (Signed)
Order placed. lmom informing patient.

## 2019-01-10 ENCOUNTER — Other Ambulatory Visit: Payer: Self-pay

## 2019-01-10 ENCOUNTER — Ambulatory Visit
Admission: RE | Admit: 2019-01-10 | Discharge: 2019-01-10 | Disposition: A | Discharge status: Home / Self Care | Payer: BC Managed Care – PPO | Source: Ambulatory Visit | Attending: Family Medicine | Admitting: Family Medicine

## 2019-01-10 DIAGNOSIS — R922 Inconclusive mammogram: Secondary | ICD-10-CM | POA: Diagnosis not present

## 2019-01-10 DIAGNOSIS — R921 Mammographic calcification found on diagnostic imaging of breast: Secondary | ICD-10-CM | POA: Diagnosis not present

## 2019-01-29 ENCOUNTER — Telehealth: Payer: Self-pay | Admitting: Family Medicine

## 2019-01-29 MED ORDER — FLUOXETINE HCL 20 MG PO TABS: 20.0000 mg | ORAL_TABLET | Freq: Every day | ORAL | 0 refills | Status: DC

## 2019-01-29 NOTE — Telephone Encounter (Signed)
done

## 2019-01-29 NOTE — Telephone Encounter (Signed)
Pt called and stated that she has new insurance and needs a refill on Fluoxetine sent to the [facility] on [address]

## 2019-01-29 NOTE — Telephone Encounter (Signed)
Please handle. Thanks.

## 2019-03-26 ENCOUNTER — Encounter: Payer: BLUE CROSS/BLUE SHIELD | Admitting: Family Medicine

## 2019-03-27 NOTE — Progress Notes (Signed)
Subjective:    Patient ID: Brittany [name], female    DOB: [birth year], 63 y.o.   MRN: [id number]  HPI Chief Complaint  Patient presents with  . cpe    cpe,    She is here for a complete physical exam. 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 Started this job 6 weeks ago.  Denies smoking, drug use.  No increased alcohol use. Diet: fairly unhealthy  Excerise: walks some days   Immunizations: flu shot October 2nd at [facility].   Health maintenance:  Mammogram: 01/2019  Colonoscopy:  05/2017 and 10 year recall  Last Gynecological Exam: 2 years ago  Last Menstrual cycle: hyst for uterine polyps and fibroids  Last Dental Exam: June 2020  Last Eye Exam: Nov 2019  Depression screen [facility] 2/9 03/28/2019 03/22/2018 11/14/2017 10/20/2017  Decreased Interest 0 0 3 3  Down, Depressed, Hopeless 0 0 3 3  PHQ - 2 Score 0 0 6 6  Altered sleeping - - 0 0  Tired, decreased energy - - 3 3  Change in appetite - - 1 0  Feeling bad or failure about yourself  - - 1 0  Trouble concentrating - - 3 1  Moving slowly or fidgety/restless - - 1 0  Suicidal thoughts - - 0 0  PHQ-9 Score - - 15 10  Difficult doing work/chores - - Not difficult at all Not difficult at all     Wears seatbelt always, uses sunscreen, smoke detectors in home and functioning, does not text while driving and feels safe in home environment.   Reviewed allergies, medications, past medical, surgical, family, and social history.   Review of Systems Review of Systems Constitutional: -fever, -chills, -sweats, -unexpected weight change,-fatigue ENT: -runny nose, -ear pain, -sore throat Cardiology:  -chest pain, -palpitations, -edema Respiratory: -cough, -shortness of breath, -wheezing Gastroenterology: -abdominal pain, -nausea, -vomiting, -diarrhea, -constipation  Hematology: -bleeding or bruising  problems Musculoskeletal: -arthralgias, -myalgias, -joint swelling, -back pain Ophthalmology: -vision changes Urology: -dysuria, -difficulty urinating, -hematuria, -urinary frequency, -urgency Neurology: -headache, -weakness, -tingling, -numbness       Objective:   Physical Exam BP 120/70   Pulse 71   Ht 5' 4.5" (1.638 m)   Wt 162 lb 6.4 oz (73.7 kg)   BMI 27.45 kg/m   General Appearance:    Alert, cooperative, no distress, appears stated age  Head:    Normocephalic, without obvious abnormality, atraumatic  Eyes:    PERRL, conjunctiva/corneas clear, EOM's intact  Ears:    Normal TM's and external ear canals  Nose:  Mask in place  Throat:  Mask in place  Neck:   Supple, no lymphadenopathy;  thyroid:  no   enlargement/tenderness/nodules; no carotid   bruit or JVD  Back:    Spine nontender, no curvature, ROM normal, no CVA     tenderness  Lungs:     Clear to auscultation bilaterally without wheezes, rales or     ronchi; respirations unlabored  Chest Wall:    No tenderness or deformity   Heart:    Regular rate and rhythm, S1 and S2 normal, no murmur, rub   or gallop  Breast Exam:   Declines.  Mammogram today.  Abdomen:     Soft, non-tender, nondistended, normoactive bowel sounds,    no masses, no hepatosplenomegaly  Genitalia:    Normal external genitalia  without lesions.  BUS and vagina normal;  No abnormal vaginal discharge.  adnexa not enlarged, nontender, no masses.  Pap not indicated.  Chaperone present     Extremities:   No clubbing, cyanosis or edema  Pulses:   2+ and symmetric all extremities  Skin:   Skin color, texture, turgor normal, no rashes or lesions  Lymph nodes:   Cervical, supraclavicular, and axillary nodes normal  Neurologic:   CNII-XII intact, normal strength, sensation and gait; reflexes 2+ and symmetric throughout          Psych:   Normal mood, affect, hygiene and grooming.        Assessment & Plan:  Routine general medical examination at a health care  facility - Plan: CBC with Differential, Comprehensive metabolic panel, TSH, T4, Free, Lipid Panel -Here today for fasting CPE.  Preventive health care discussed.  Mammogram and colonoscopy up-to-date.  Counseling on healthy diet and exercise.  Discussed safety and health promotion.  Anxiety and depression -Reports taking Prozac daily and doing well on this medication.  Reports being in good spirits.  Elevated LDL cholesterol level - Plan: Lipid Panel -Discussed low-cholesterol diet.  Follow-up pending lipid panel.  Vitamin D deficiency - Plan: Vitamin D, 25-hydroxy -Recommend she start taking a multivitamin with vitamin D or a vitamin D supplement.

## 2019-03-27 NOTE — Patient Instructions (Addendum)
Is a pleasure seeing you today. I recommend that you start taking a vitamin D supplement or at least a multivitamin that includes 1000 IUs of vitamin D. It is recommended that you get at least 150 minutes of vigorous physical activity each week. Make sure you are eating a healthy well-balanced diet and not overdoing on sweets and carbohydrates.  Continue getting biannual dental cleanings and annual eye exams.  We will be in touch with your lab results.   Preventive Care 48-73 Years Old, Female Preventive care refers to visits with your health care provider and lifestyle choices that can promote health and wellness. This includes:  A yearly physical exam. This may also be called an annual well check.  Regular dental visits and eye exams.  Immunizations.  Screening for certain conditions.  Healthy lifestyle choices, such as eating a healthy diet, getting regular exercise, not using drugs or products that contain nicotine and tobacco, and limiting alcohol use. What can I expect for my preventive care visit? Physical exam Your health care provider will check your:  Height and weight. This may be used to calculate body mass index (BMI), which tells if you are at a healthy weight.  Heart rate and blood pressure.  Skin for abnormal spots. Counseling Your health care provider may ask you questions about your:  Alcohol, tobacco, and drug use.  Emotional well-being.  Home and relationship well-being.  Sexual activity.  Eating habits.  Work and work Statistician.  Method of birth control.  Menstrual cycle.  Pregnancy history. What immunizations do I need?  Influenza (flu) vaccine  This is recommended every year. Tetanus, diphtheria, and pertussis (Tdap) vaccine  You may need a Td booster every 10 years. Varicella (chickenpox) vaccine  You may need this if you have not been vaccinated. Zoster (shingles) vaccine  You may need this after age 58. Measles, mumps, and  rubella (MMR) vaccine  You may need at least one dose of MMR if you were born in 1957 or later. You may also need a second dose. Pneumococcal conjugate (PCV13) vaccine  You may need this if you have certain conditions and were not previously vaccinated. Pneumococcal polysaccharide (PPSV23) vaccine  You may need one or two doses if you smoke cigarettes or if you have certain conditions. Meningococcal conjugate (MenACWY) vaccine  You may need this if you have certain conditions. Hepatitis A vaccine  You may need this if you have certain conditions or if you travel or work in places where you may be exposed to hepatitis A. Hepatitis B vaccine  You may need this if you have certain conditions or if you travel or work in places where you may be exposed to hepatitis B. Haemophilus influenzae type b (Hib) vaccine  You may need this if you have certain conditions. Human papillomavirus (HPV) vaccine  If recommended by your health care provider, you may need three doses over 6 months. You may receive vaccines as individual doses or as more than one vaccine together in one shot (combination vaccines). Talk with your health care provider about the risks and benefits of combination vaccines. What tests do I need? Blood tests  Lipid and cholesterol levels. These may be checked every 5 years, or more frequently if you are over 69 years old.  Hepatitis C test.  Hepatitis B test. Screening  Lung cancer screening. You may have this screening every year starting at age 9 if you have a 30-pack-year history of smoking and currently smoke or have quit  within the past 15 years.  Colorectal cancer screening. All adults should have this screening starting at age 87 and continuing until age 42. Your health care provider may recommend screening at age 27 if you are at increased risk. You will have tests every 1-10 years, depending on your results and the type of screening test.  Diabetes screening. This  is done by checking your blood sugar (glucose) after you have not eaten for a while (fasting). You may have this done every 1-3 years.  Mammogram. This may be done every 1-2 years. Talk with your health care provider about when you should start having regular mammograms. This may depend on whether you have a family history of breast cancer.  BRCA-related cancer screening. This may be done if you have a family history of breast, ovarian, tubal, or peritoneal cancers.  Pelvic exam and Pap test. This may be done every 3 years starting at age 66. Starting at age 29, this may be done every 5 years if you have a Pap test in combination with an HPV test. Other tests  Sexually transmitted disease (STD) testing.  Bone density scan. This is done to screen for osteoporosis. You may have this scan if you are at high risk for osteoporosis. Follow these instructions at home: Eating and drinking  Eat a diet that includes fresh fruits and vegetables, whole grains, lean protein, and low-fat dairy.  Take vitamin and mineral supplements as recommended by your health care provider.  Do not drink alcohol if: ? Your health care provider tells you not to drink. ? You are pregnant, may be pregnant, or are planning to become pregnant.  If you drink alcohol: ? Limit how much you have to 0-1 drink a day. ? Be aware of how much alcohol is in your drink. In the [place] one drink equals one 12 oz bottle of beer (355 mL), one 5 oz glass of wine (148 mL), or one 1 oz glass of hard liquor (44 mL). Lifestyle  Take daily care of your teeth and gums.  Stay active. Exercise for at least 30 minutes on 5 or more days each week.  Do not use any products that contain nicotine or tobacco, such as cigarettes, e-cigarettes, and chewing tobacco. If you need help quitting, ask your health care provider.  If you are sexually active, practice safe sex. Use a condom or other form of birth control (contraception) in order to prevent  pregnancy and STIs (sexually transmitted infections).  If told by your health care provider, take low-dose aspirin daily starting at age 14. What's next?  Visit your health care provider once a year for a well check visit.  Ask your health care provider how often you should have your eyes and teeth checked.  Stay up to date on all vaccines. This information is not intended to replace advice given to you by your health care provider. Make sure you discuss any questions you have with your health care provider. Document Released: 05/23/2015 Document Revised: 01/05/2018 Document Reviewed: 01/05/2018 Elsevier Patient Education  2020 [employer].

## 2019-03-28 ENCOUNTER — Encounter: Payer: Self-pay | Admitting: Family Medicine

## 2019-03-28 ENCOUNTER — Other Ambulatory Visit: Payer: Self-pay

## 2019-03-28 ENCOUNTER — Ambulatory Visit: Payer: BC Managed Care – PPO | Admitting: Family Medicine

## 2019-03-28 VITALS — BP 120/70 | HR 71 | Ht 64.5 in | Wt 162.4 lb

## 2019-03-28 DIAGNOSIS — F419 Anxiety disorder, unspecified: Secondary | ICD-10-CM

## 2019-03-28 DIAGNOSIS — E7800 Pure hypercholesterolemia, unspecified: Secondary | ICD-10-CM

## 2019-03-28 DIAGNOSIS — Z0000 Encounter for general adult medical examination without abnormal findings: Secondary | ICD-10-CM | POA: Diagnosis not present

## 2019-03-28 DIAGNOSIS — E559 Vitamin D deficiency, unspecified: Secondary | ICD-10-CM | POA: Diagnosis not present

## 2019-03-28 DIAGNOSIS — F329 Major depressive disorder, single episode, unspecified: Secondary | ICD-10-CM

## 2019-03-28 DIAGNOSIS — F32A Depression, unspecified: Secondary | ICD-10-CM

## 2019-03-29 LAB — CBC WITH DIFFERENTIAL/PLATELET
Basophils Absolute: 0 10*3/uL (ref 0.0–0.2)
Basos: 0 %
EOS (ABSOLUTE): 0.1 10*3/uL (ref 0.0–0.4)
Eos: 1 %
Hematocrit: 39.7 % (ref 34.0–46.6)
Hemoglobin: 14 g/dL (ref 11.1–15.9)
Immature Grans (Abs): 0 10*3/uL (ref 0.0–0.1)
Immature Granulocytes: 0 %
Lymphocytes Absolute: 2.9 10*3/uL (ref 0.7–3.1)
Lymphs: 41 %
MCH: 32.9 pg (ref 26.6–33.0)
MCHC: 35.3 g/dL (ref 31.5–35.7)
MCV: 93 fL (ref 79–97)
Monocytes Absolute: 0.6 10*3/uL (ref 0.1–0.9)
Monocytes: 9 %
Neutrophils Absolute: 3.4 10*3/uL (ref 1.4–7.0)
Neutrophils: 49 %
Platelets: 325 10*3/uL (ref 150–450)
RBC: 4.26 x10E6/uL (ref 3.77–5.28)
RDW: 12.1 % (ref 11.7–15.4)
WBC: 7 10*3/uL (ref 3.4–10.8)

## 2019-03-29 LAB — COMPREHENSIVE METABOLIC PANEL
ALT: 15 IU/L (ref 0–32)
AST: 21 IU/L (ref 0–40)
Albumin/Globulin Ratio: 1.8 (ref 1.2–2.2)
Albumin: 4.4 g/dL (ref 3.8–4.8)
Alkaline Phosphatase: 104 IU/L (ref 39–117)
BUN/Creatinine Ratio: 16 (ref 12–28)
BUN: 14 mg/dL (ref 8–27)
Bilirubin Total: 0.5 mg/dL (ref 0.0–1.2)
CO2: 24 mmol/L (ref 20–29)
Calcium: 9.7 mg/dL (ref 8.7–10.3)
Chloride: 103 mmol/L (ref 96–106)
Creatinine, Ser: 0.9 mg/dL (ref 0.57–1.00)
GFR calc Af Amer: 80 mL/min/{1.73_m2} (ref >59)
GFR calc non Af Amer: 69 mL/min/{1.73_m2} (ref >59)
Globulin, Total: 2.5 g/dL (ref 1.5–4.5)
Glucose: 94 mg/dL (ref 65–99)
Potassium: 4.4 mmol/L (ref 3.5–5.2)
Sodium: 141 mmol/L (ref 134–144)
Total Protein: 6.9 g/dL (ref 6.0–8.5)

## 2019-03-29 LAB — LIPID PANEL
Chol/HDL Ratio: 3.4 ratio (ref 0.0–4.4)
Cholesterol, Total: 206 mg/dL — ABNORMAL HIGH (ref 100–199)
HDL: 60 mg/dL (ref >39)
LDL Chol Calc (NIH): 124 mg/dL — ABNORMAL HIGH (ref 0–99)
Triglycerides: 126 mg/dL (ref 0–149)
VLDL Cholesterol Cal: 22 mg/dL (ref 5–40)

## 2019-03-29 LAB — T4, FREE: Free T4: 1.14 ng/dL (ref 0.82–1.77)

## 2019-03-29 LAB — TSH: TSH: 1.46 u[IU]/mL (ref 0.450–4.500)

## 2019-03-29 LAB — VITAMIN D 25 HYDROXY (VIT D DEFICIENCY, FRACTURES): Vit D, 25-Hydroxy: 21.4 ng/mL — ABNORMAL LOW (ref 30.0–100.0)

## 2019-05-12 ENCOUNTER — Other Ambulatory Visit: Payer: Self-pay | Admitting: Family Medicine

## 2019-06-21 ENCOUNTER — Ambulatory Visit: Payer: BC Managed Care – PPO | Attending: Family

## 2019-06-21 DIAGNOSIS — Z23 Encounter for immunization: Secondary | ICD-10-CM

## 2019-06-21 NOTE — Progress Notes (Signed)
   Covid-19 Vaccination Clinic  Name:  Brittany [name]    MRN: [id number] DOB: 12/23/1955  06/21/2019  Brittany [name] was observed post Covid-19 immunization for 15 minutes without incidence. She was provided with Vaccine Information Sheet and instruction to access the V-Safe system.   Brittany [name] was instructed to call 911 with any severe reactions post vaccine: Marland Kitchen Difficulty breathing  . Swelling of your face and throat  . A fast heartbeat  . A bad rash all over your body  . Dizziness and weakness    Immunizations Administered    Name Date Dose VIS Date Route   Moderna COVID-19 Vaccine 06/21/2019  5:10 PM 0.5 mL 04/10/2019 Intramuscular   Manufacturer: Moderna   Lot: [id number]   NDC: 17793-903-00

## 2019-07-24 ENCOUNTER — Ambulatory Visit: Payer: Self-pay | Attending: Family

## 2019-07-24 DIAGNOSIS — Z23 Encounter for immunization: Secondary | ICD-10-CM

## 2019-07-24 NOTE — Progress Notes (Signed)
   Covid-19 Vaccination Clinic  Name:  Brittany [name]    MRN: [id number] DOB: 03-09-1956  07/24/2019  [name] was observed post Covid-19 immunization for 15 minutes without incident. She was provided with Vaccine Information Sheet and instruction to access the V-Safe system.   [name] was instructed to call 911 with any severe reactions post vaccine: Marland Kitchen Difficulty breathing  . Swelling of face and throat  . A fast heartbeat  . A bad rash all over body  . Dizziness and weakness   Immunizations Administered    Name Date Dose VIS Date Route   Moderna COVID-19 Vaccine 07/24/2019 11:15 AM 0.5 mL 04/10/2019 Intramuscular   Manufacturer: Moderna   Lot: [id number]   NDC: 14709-295-74

## 2019-07-31 ENCOUNTER — Other Ambulatory Visit: Payer: Self-pay | Admitting: Family Medicine

## 2019-07-31 NOTE — Telephone Encounter (Signed)
Is this okay to refill? Seen for cpe back in 11/20

## 2019-10-30 ENCOUNTER — Other Ambulatory Visit: Payer: Self-pay | Admitting: Family Medicine

## 2019-10-30 NOTE — Telephone Encounter (Signed)
Is this okay to refill? Pt is see yearly for cpe

## 2019-12-12 ENCOUNTER — Other Ambulatory Visit: Payer: Self-pay | Admitting: Family Medicine

## 2019-12-12 DIAGNOSIS — Z1231 Encounter for screening mammogram for malignant neoplasm of breast: Secondary | ICD-10-CM

## 2020-01-11 ENCOUNTER — Other Ambulatory Visit: Payer: Self-pay

## 2020-01-11 ENCOUNTER — Ambulatory Visit
Admission: RE | Admit: 2020-01-11 | Discharge: 2020-01-11 | Disposition: A | Discharge status: Home / Self Care | Payer: BC Managed Care – PPO | Source: Ambulatory Visit | Attending: Family Medicine | Admitting: Family Medicine

## 2020-01-11 DIAGNOSIS — Z1231 Encounter for screening mammogram for malignant neoplasm of breast: Secondary | ICD-10-CM

## 2020-01-28 ENCOUNTER — Other Ambulatory Visit: Payer: Self-pay | Admitting: Family Medicine

## 2020-01-29 NOTE — Telephone Encounter (Signed)
Pt has an cpe scheduled for November. Ok to refill til then

## 2020-02-05 ENCOUNTER — Ambulatory Visit: Payer: Self-pay

## 2020-02-29 ENCOUNTER — Ambulatory Visit: Payer: BC Managed Care – PPO | Attending: Internal Medicine

## 2020-02-29 DIAGNOSIS — Z23 Encounter for immunization: Secondary | ICD-10-CM

## 2020-02-29 NOTE — Progress Notes (Signed)
   Covid-19 Vaccination Clinic  Name:  Susette Seminara    MRN: [id number] DOB: [birth year]  02/29/2020  Ms. Penalver was observed post Covid-19 immunization for 15 minutes without incident. She was provided with Vaccine Information Sheet and instruction to access the V-Safe system.   Ms. Cansler was instructed to call 911 with any severe reactions post vaccine: Marland Kitchen Difficulty breathing  . Swelling of face and throat  . A fast heartbeat  . A bad rash all over body  . Dizziness and weakness

## 2020-04-01 ENCOUNTER — Encounter: Payer: Self-pay | Admitting: Family Medicine

## 2020-04-01 ENCOUNTER — Other Ambulatory Visit: Payer: Self-pay

## 2020-04-01 ENCOUNTER — Ambulatory Visit: Payer: BC Managed Care – PPO | Admitting: Family Medicine

## 2020-04-01 VITALS — BP 110/60 | HR 68 | Ht 65.25 in | Wt 164.0 lb

## 2020-04-01 DIAGNOSIS — G8929 Other chronic pain: Secondary | ICD-10-CM

## 2020-04-01 DIAGNOSIS — Z0000 Encounter for general adult medical examination without abnormal findings: Secondary | ICD-10-CM | POA: Diagnosis not present

## 2020-04-01 DIAGNOSIS — E7800 Pure hypercholesterolemia, unspecified: Secondary | ICD-10-CM

## 2020-04-01 DIAGNOSIS — Z1329 Encounter for screening for other suspected endocrine disorder: Secondary | ICD-10-CM

## 2020-04-01 DIAGNOSIS — M25561 Pain in right knee: Secondary | ICD-10-CM

## 2020-04-01 DIAGNOSIS — F419 Anxiety disorder, unspecified: Secondary | ICD-10-CM

## 2020-04-01 DIAGNOSIS — R29898 Other symptoms and signs involving the musculoskeletal system: Secondary | ICD-10-CM

## 2020-04-01 DIAGNOSIS — Z136 Encounter for screening for cardiovascular disorders: Secondary | ICD-10-CM | POA: Diagnosis not present

## 2020-04-01 DIAGNOSIS — E559 Vitamin D deficiency, unspecified: Secondary | ICD-10-CM

## 2020-04-01 DIAGNOSIS — Z01419 Encounter for gynecological examination (general) (routine) without abnormal findings: Secondary | ICD-10-CM

## 2020-04-01 DIAGNOSIS — F32A Depression, unspecified: Secondary | ICD-10-CM

## 2020-04-01 LAB — LIPID PANEL

## 2020-04-01 NOTE — Progress Notes (Signed)
Subjective:    Patient ID: Brittany [name], female    DOB: 1956/03/01, 64 y.o.   MRN: [id number]  HPI Chief Complaint  Patient presents with  . fasting cpe    fasting cpe. having some pain on right leg, would like to discuss obvgyn referral   She is here for a complete physical exam. She is also following up on chronic health conditions.   Has a dermatologist. Would like to see a gynecologist. No issues.   States she fell, sliding down her steps. Since then her right hip and right knee have been bothering her.  Thinks she is having "ACL" issues. Denies hx of surgery or known ACL injury.   Right knee pain- constant pain and popping. Did not cause her fall. Exercise actually improves her symptoms.  Random right hip and leg pain. Taking Tylenol or ibuprofen helps temporarily. No redness, swelling or giving away.  No other arthralgias or myalgias.   States her mood has been good. Taking Prozac and likes this.  Is interested in seeing a new therapist.  Getting a massage monthly   Vitamin D def- taking a supplement   Lives alone, works as a Dietitian at [employer]  Denies smoking, drug use.  No increased alcohol use Diet: fairly healthy, grades herself a B- on diet  Excerise: regular exercise since August. Body pump and yoga  Immunizations: UTD  Health maintenance:  Mammogram:01/11/2020 Colonoscopy: 05/2017 Last Gynecological Exam: 2020  Hysterectomy for uterine polyps and fibroids   Last Dental Exam: UTD every 6 months  Last Eye Exam: 1 year ago   Wears seatbelt always, uses sunscreen, smoke detectors in home and functioning, does not text while driving and feels safe in home environment.   Reviewed allergies, medications, past medical, surgical, family, and social history.   Review of Systems Review of Systems Constitutional: -fever, -chills, -sweats, -unexpected weight change,-fatigue ENT: -runny nose, -ear pain, -sore throat Cardiology:  -chest pain,  -palpitations, -edema Respiratory: -cough, -shortness of breath, -wheezing Gastroenterology: -abdominal pain, -nausea, -vomiting, -diarrhea, -constipation  Hematology: -bleeding or bruising problems Musculoskeletal: + (R knee)arthralgias, -myalgias, -joint swelling, -back pain Ophthalmology: -vision changes Urology: -dysuria, -difficulty urinating, -hematuria, -urinary frequency, -urgency Neurology: -headache, -weakness, -tingling, -numbness       Objective:   Physical Exam BP 110/60   Pulse 68   Ht 5' 5.25" (1.657 m)   Wt 164 lb (74.4 kg)   BMI 27.08 kg/m   General Appearance:    Alert, cooperative, no distress, appears stated age  Head:    Normocephalic, without obvious abnormality, atraumatic  Eyes:    PERRL, conjunctiva/corneas clear, EOM's intact  Ears:    Normal TM's and external ear canals  Nose:   Mask on   Throat:   Mask on   Neck:   Supple, no lymphadenopathy;  thyroid:  no   enlargement/tenderness/nodules; no JVD  Back:    Spine nontender, no curvature, ROM normal, no CVA     tenderness  Lungs:     Clear to auscultation bilaterally without wheezes, rales or     ronchi; respirations unlabored  Chest Wall:    No tenderness or deformity   Heart:    Regular rate and rhythm, S1 and S2 normal, no murmur, rub   or gallop  Breast Exam:    OB/GYN referral. Mamm UTD      No axillary lymphadenopathy  Abdomen:     Soft, non-tender, nondistended, normoactive bowel sounds,    no  masses, no hepatosplenomegaly  Genitalia:    OB/GYN      Extremities:   No clubbing, cyanosis or edema. Right knee without edema, non tender, no laxity. Negative Mcmurrays   Pulses:   2+ and symmetric all extremities  Skin:   Skin color, texture, turgor normal, no rashes or lesions  Lymph nodes:   Cervical, supraclavicular, and axillary nodes normal  Neurologic:   CNII-XII intact, normal strength, sensation and gait; reflexes 2+ and symmetric throughout          Psych:   Normal mood, affect, hygiene and  grooming.          Assessment & Plan:  Routine general medical examination at a health care facility - Plan: CBC with Differential/Platelet, Comprehensive metabolic panel, TSH, T4, free, Lipid panel -She is here for a fasting CPE.  Preventive health care reviewed.  She would like a referral to gynecology for pelvic exams.  Referral made to [facility] gynecology.  Counseling on healthy lifestyle including diet and exercise.  She seems to be taking good care of herself.  Immunizations reviewed and she is up-to-date.  Discussed safety and health promotion.  Anxiety and depression -Her mood appears stable.  Continue Prozac.  I will provide her with a list of therapists since she is interested in finding a new one.  Elevated LDL cholesterol level - Plan: Lipid panel -Recommend low-fat, low-cholesterol diet and getting plenty of physical activity.  Check lipid panel and follow-up  Screening for thyroid disorder - Plan: TSH, T4, free  Screening for heart disease - Plan: EKG 12-Lead -Done for screening.  EKG shows SR with rate 54. Read by Dr. Susann Givens and myself.   Visit for pelvic exam - Plan: Ambulatory referral to Gynecology -Denies any concerns.  Would like referral for screening purposes  Chronic pain of right knee - Plan: DG Knee Complete 4 Views Right, Ambulatory referral to Orthopedic Surgery -Discussed symptomatic treatment.  Offered referral to physical therapy.  She would prefer to see Ortho.  I will also send her for an x-ray  Popping sound of knee joint - Plan: DG Knee Complete 4 Views Right, Ambulatory referral to Orthopedic Surgery -X-ray ordered.  Symptomatic treatment discussed.  Referral to Ortho  Vitamin D deficiency - Plan: VITAMIN D 25 Hydroxy (Vit-D Deficiency, Fractures) -Continue vitamin D supplement and adjust dose per vitamin D results

## 2020-04-01 NOTE — Patient Instructions (Addendum)
You will hear from [facility] to schedule with Dr. Junius Roads for your knee.   You will hear from [facility] as well.    You can call to schedule your appointment with the counselor. A few offices are listed below for you to call.    [place] [address] [place], [place] [address]  Phone: Rico P.A  [address], [place], [place] [address]  Phone: [phone]  [facility] [address] [place], [place] [address] [phone] [phone] for appointments   [facility]  [address] [place], [place] [address] [place]  [place]  (across from [facility])  [place] Resident only [address], [place], [place] [address] [phone]  [facility] [address] [place], [place] [address] [phone]    Preventive Care 85-39 Years Old, Female Preventive care refers to visits with your health care provider and lifestyle choices that can promote health and wellness. This includes:  A yearly physical exam. This may also be called an annual well check.  Regular dental visits and eye exams.  Immunizations.  Screening for certain conditions.  Healthy lifestyle choices, such as eating a healthy diet, getting regular exercise, not using drugs or products that contain nicotine and tobacco, and limiting alcohol use. What can I expect for my preventive care visit? Physical exam Your health care provider will check your:  Height and weight. This may be used to calculate body mass index (BMI), which tells if you are at a healthy weight.  Heart rate and blood pressure.  Skin for abnormal spots. Counseling Your health care provider may ask you questions about your:  Alcohol, tobacco, and drug use.  Emotional  well-being.  Home and relationship well-being.  Sexual activity.  Eating habits.  Work and work Statistician.  Method of birth control.  Menstrual cycle.  Pregnancy history. What immunizations do I need?  Influenza (flu) vaccine  This is recommended every year. Tetanus, diphtheria, and pertussis (Tdap) vaccine  You may need a Td booster every 10 years. Varicella (chickenpox) vaccine  You may need this if you have not been vaccinated. Zoster (shingles) vaccine  You may need this after age 41. Measles, mumps, and rubella (MMR) vaccine  You may need at least one dose of MMR if you were born in 1957 or later. You may also need a second dose. Pneumococcal conjugate (PCV13) vaccine  You may need this if you have certain conditions and were not previously vaccinated. Pneumococcal polysaccharide (PPSV23) vaccine  You may need one or two doses if you smoke cigarettes or if you have certain conditions. Meningococcal conjugate (MenACWY) vaccine  You may need this if you have certain conditions. Hepatitis A vaccine  You may need this if you have certain conditions or if you travel or work in places where you may be exposed to hepatitis A. Hepatitis B vaccine  You may need this if you have certain conditions or if you travel or work in places where you may be exposed to hepatitis B. Haemophilus influenzae type b (Hib) vaccine  You may need this if you have certain conditions. Human papillomavirus (HPV) vaccine  If recommended by your health care provider, you may need three doses over 6 months. You may receive vaccines as individual doses or as more than one vaccine  together in one shot (combination vaccines). Talk with your health care provider about the risks and benefits of combination vaccines. What tests do I need? Blood tests  Lipid and cholesterol levels. These may be checked every 5 years, or more frequently if you are over 58 years old.  Hepatitis C  test.  Hepatitis B test. Screening  Lung cancer screening. You may have this screening every year starting at age 35 if you have a 30-pack-year history of smoking and currently smoke or have quit within the past 15 years.  Colorectal cancer screening. All adults should have this screening starting at age 44 and continuing until age 83. Your health care provider may recommend screening at age 57 if you are at increased risk. You will have tests every 1-10 years, depending on your results and the type of screening test.  Diabetes screening. This is done by checking your blood sugar (glucose) after you have not eaten for a while (fasting). You may have this done every 1-3 years.  Mammogram. This may be done every 1-2 years. Talk with your health care provider about when you should start having regular mammograms. This may depend on whether you have a family history of breast cancer.  BRCA-related cancer screening. This may be done if you have a family history of breast, ovarian, tubal, or peritoneal cancers.  Pelvic exam and Pap test. This may be done every 3 years starting at age 68. Starting at age 37, this may be done every 5 years if you have a Pap test in combination with an HPV test. Other tests  Sexually transmitted disease (STD) testing.  Bone density scan. This is done to screen for osteoporosis. You may have this scan if you are at high risk for osteoporosis. Follow these instructions at home: Eating and drinking  Eat a diet that includes fresh fruits and vegetables, whole grains, lean protein, and low-fat dairy.  Take vitamin and mineral supplements as recommended by your health care provider.  Do not drink alcohol if: ? Your health care provider tells you not to drink. ? You are pregnant, may be pregnant, or are planning to become pregnant.  If you drink alcohol: ? Limit how much you have to 0-1 drink a day. ? Be aware of how much alcohol is in your drink. In the [place] one  drink equals one 12 oz bottle of beer (355 mL), one 5 oz glass of wine (148 mL), or one 1 oz glass of hard liquor (44 mL). Lifestyle  Take daily care of your teeth and gums.  Stay active. Exercise for at least 30 minutes on 5 or more days each week.  Do not use any products that contain nicotine or tobacco, such as cigarettes, e-cigarettes, and chewing tobacco. If you need help quitting, ask your health care provider.  If you are sexually active, practice safe sex. Use a condom or other form of birth control (contraception) in order to prevent pregnancy and STIs (sexually transmitted infections).  If told by your health care provider, take low-dose aspirin daily starting at age 54. What's next?  Visit your health care provider once a year for a well check visit.  Ask your health care provider how often you should have your eyes and teeth checked.  Stay up to date on all vaccines. This information is not intended to replace advice given to you by your health care provider. Make sure you discuss any questions you have with your health care provider. Document Revised:  01/05/2018 Document Reviewed: 01/05/2018 Elsevier Patient Education  [place].

## 2020-04-02 ENCOUNTER — Ambulatory Visit: Payer: BC Managed Care – PPO | Admitting: Family Medicine

## 2020-04-02 ENCOUNTER — Ambulatory Visit: Payer: Self-pay

## 2020-04-02 DIAGNOSIS — M545 Low back pain, unspecified: Secondary | ICD-10-CM | POA: Diagnosis not present

## 2020-04-02 DIAGNOSIS — M25561 Pain in right knee: Secondary | ICD-10-CM | POA: Diagnosis not present

## 2020-04-02 DIAGNOSIS — G8929 Other chronic pain: Secondary | ICD-10-CM

## 2020-04-02 LAB — COMPREHENSIVE METABOLIC PANEL
ALT: 23 IU/L (ref 0–32)
AST: 20 IU/L (ref 0–40)
Albumin/Globulin Ratio: 1.9 (ref 1.2–2.2)
Albumin: 4.3 g/dL (ref 3.8–4.8)
Alkaline Phosphatase: 105 IU/L (ref 44–121)
BUN/Creatinine Ratio: 10 — ABNORMAL LOW (ref 12–28)
BUN: 10 mg/dL (ref 8–27)
Bilirubin Total: 0.3 mg/dL (ref 0.0–1.2)
CO2: 21 mmol/L (ref 20–29)
Calcium: 9.4 mg/dL (ref 8.7–10.3)
Chloride: 105 mmol/L (ref 96–106)
Creatinine, Ser: 0.98 mg/dL (ref 0.57–1.00)
GFR calc Af Amer: 71 mL/min/{1.73_m2} (ref >59)
GFR calc non Af Amer: 62 mL/min/{1.73_m2} (ref >59)
Globulin, Total: 2.3 g/dL (ref 1.5–4.5)
Glucose: 84 mg/dL (ref 65–99)
Potassium: 4.8 mmol/L (ref 3.5–5.2)
Sodium: 140 mmol/L (ref 134–144)
Total Protein: 6.6 g/dL (ref 6.0–8.5)

## 2020-04-02 LAB — CBC WITH DIFFERENTIAL/PLATELET
Basophils Absolute: 0 10*3/uL (ref 0.0–0.2)
Basos: 1 %
EOS (ABSOLUTE): 0.1 10*3/uL (ref 0.0–0.4)
Eos: 2 %
Hematocrit: 39 % (ref 34.0–46.6)
Hemoglobin: 13.4 g/dL (ref 11.1–15.9)
Immature Grans (Abs): 0 10*3/uL (ref 0.0–0.1)
Immature Granulocytes: 0 %
Lymphocytes Absolute: 2.8 10*3/uL (ref 0.7–3.1)
Lymphs: 44 %
MCH: 32 pg (ref 26.6–33.0)
MCHC: 34.4 g/dL (ref 31.5–35.7)
MCV: 93 fL (ref 79–97)
Monocytes Absolute: 0.6 10*3/uL (ref 0.1–0.9)
Monocytes: 9 %
Neutrophils Absolute: 2.7 10*3/uL (ref 1.4–7.0)
Neutrophils: 44 %
Platelets: 331 10*3/uL (ref 150–450)
RBC: 4.19 x10E6/uL (ref 3.77–5.28)
RDW: 12.6 % (ref 11.7–15.4)
WBC: 6.2 10*3/uL (ref 3.4–10.8)

## 2020-04-02 LAB — LIPID PANEL
Chol/HDL Ratio: 4 ratio (ref 0.0–4.4)
Cholesterol, Total: 237 mg/dL — ABNORMAL HIGH (ref 100–199)
HDL: 60 mg/dL (ref >39)
LDL Chol Calc (NIH): 156 mg/dL — ABNORMAL HIGH (ref 0–99)
Triglycerides: 116 mg/dL (ref 0–149)
VLDL Cholesterol Cal: 21 mg/dL (ref 5–40)

## 2020-04-02 LAB — VITAMIN D 25 HYDROXY (VIT D DEFICIENCY, FRACTURES): Vit D, 25-Hydroxy: 28.1 ng/mL — ABNORMAL LOW (ref 30.0–100.0)

## 2020-04-02 LAB — TSH: TSH: 1.04 u[IU]/mL (ref 0.450–4.500)

## 2020-04-02 LAB — T4, FREE: Free T4: 1.15 ng/dL (ref 0.82–1.77)

## 2020-04-02 MED ORDER — IBUPROFEN 800 MG PO TABS: 800.0000 mg | ORAL_TABLET | Freq: Three times a day (TID) | ORAL | 1 refills | Status: DC | PRN

## 2020-04-02 NOTE — Progress Notes (Signed)
Office Visit Note   Patient: Brittany [name]           Date of Birth: [birth year]           MRN: [id number] Visit Date: 04/02/2020 Requested by: Avanell Shackleton, NP-C [address] [place],  [place] [address] PCP: Avanell Shackleton, NP-C  Subjective: Chief Complaint  Patient presents with  . Right Knee - Pain    HPI: 64yo F presenting to clinic with chronic R knee pain, which was been intermittent for several years. States her pain is primarily in the posteriomedial aspect of her right knee. She has also had lower back/right hip pain, and occasionally says she feels an 'intense burning' pain in her lateral calf. Mostly, however, her pain is in her knee. She says the others will come and go, but the knee pain is constant. She did feel once episode where her knee felt 'locked,' but this isn't recurring. She does Yoga to stay active, and says that she feels very flexible. She doesn't feel as though her knee pain is worse after activity, and if anything, if feels better after she works out. She also states that she feels 'lopsided,' and is curious to know if she might have a shorter leg. She says she is otherwise healthy. No weakness, no numbness, no bowel/bladder dysfunction.               ROS:   All other systems were reviewed and are negative.  Objective: Vital Signs: There were no vitals taken for this visit.  Physical Exam:  General:  Alert and oriented, in no acute distress. Pulm:  Breathing unlabored. Psy:  Normal mood, congruent affect. Skin:  Right leg skin intact, with no bruising, rashes, or erythema.   RIGHT KNEE EXAM:  General: Normal gait Standing exam: No varus or valgus deformity of the knee. No pes planus or pes cavus.  Trendelenburg: hip stabilizer weakness when standing on right.   Seated Exam:  2+ patellar crepitus, Negative J-Sign.   Palpation: Tenderness with palpation of distal medial hamstrings insertion on right. Also with tenderness to palpation within  gluteal musculature on right, and on right SI Joint.  No tenderness to palpation over medial or lateral joint lines. No tenderness with palpation of patella or patellar tendon. No tenderness over patellar facets.   Supine exam: Trace effusion, normal patellar mobility.   Ligamentous Exam:  No pain or laxity with anterior/posterior drawer.  No obvious Sag.  No pain or laxity with varus/valgus stress across the knee.   Meniscus:  McMurray with no pain,  However does have medial joint line clicking.   Strength: Hip flexion (L1), Hip Aduction (L2), Knee Extension (L3) are 5/5 Bilaterally Foot Inversion (L4), Dorsiflexion (L5), and Eversion (S1) 5/5 Bilaterally  Sensation: Intact to light touch medial and lateral aspects of lower extremities, and lateral, dorsal, and medial aspects of foot.    Imaging: XR Knee 1-2 Views Right  Result Date: 04/02/2020 X-Rays show mild patellofemoral and tibiofemoral spurring.  Joint spaces still well-preserved.  No loose bodies, no stress fracture or neoplasm.   Assessment & Plan: 64yo F presenting to clinic with concerns of posteriomedial right knee pain. Examination and Xrays as above, concerning for soft tissue source of her pain- likely due to distal hamstring, gluteal irritation. Cannot exclude lumbar pathology, given 'burning' type pain from time to time. Additionally, McMurray's does have some clicking, though pain seems more posterior.  - Initial treatment with Physical therapy to strengthen the  areas of concern - Discussed safe NSAID use, and will provide 800mg  ibuprofen - If no improvement with physical therapy, RTC for reevaluation and consideration of advanced imaging as needed - Patient expresses understanding, and has no further questions or concerns today.      Procedures: No procedures performed  No notes on file     PMFS History: Patient Active Problem List   Diagnosis Date Noted  . Popping sound of knee joint 04/01/2020  .  Chronic pain of right knee 04/01/2020  . Vitamin D deficiency 04/01/2020  . Elevated LDL cholesterol level   . Anxiety and depression 11/14/2017  . Abnormal mammogram with microcalcification 11/14/2017  . Thyroid disorder 11/14/2017   Past Medical History:  Diagnosis Date  . Anxiety and depression   . Elevated LDL cholesterol level   . Thyroid disorder 11/14/2017    Family History  Problem Relation Age of Onset  . Breast cancer Mother   . Breast cancer Paternal Grandmother   . Cancer Father     Past Surgical History:  Procedure Laterality Date  . LAPAROSCOPIC HYSTERECTOMY     Social History   Occupational History  . Not on file  Tobacco Use  . Smoking status: Former 01/15/2018  . Smokeless tobacco: Never Used  . Tobacco comment: stopped at age 62. smoked for 4-5 years 1 ppd  Substance and Sexual Activity  . Alcohol use: Yes    Comment: socially   . Drug use: Never  . Sexual activity: Not Currently

## 2020-04-02 NOTE — Progress Notes (Signed)
I saw and examined the patient with Dr. Marga Hoots and agree with assessment and plan as outlined.    Right posteromedial knee pain along with right low back/posterior hip pain.    Exam suggests medial hamstring tendinopathy.  Question lumbar foraminal stenosis.   Will try PT, with ibuprofen as needed.  MRI scan of knee and/or lumbar spine if symptoms persist.

## 2020-04-02 NOTE — Progress Notes (Signed)
Her LDL or bad cholesterol is slightly higher than last year.  Her HDL or good cholesterol is still in a great range.  Continue limiting fatty foods and foods high in cholesterol.  Try to get at least 150 minutes of physical activity each week. Her vitamin D level is close to normal range.  I do recommend that she increase her vitamin D3 over-the-counter by 1000 IUs daily.

## 2020-04-07 ENCOUNTER — Other Ambulatory Visit: Payer: Self-pay

## 2020-04-07 ENCOUNTER — Encounter: Payer: Self-pay | Admitting: Physical Therapy

## 2020-04-07 ENCOUNTER — Ambulatory Visit: Payer: BC Managed Care – PPO | Admitting: Physical Therapy

## 2020-04-07 DIAGNOSIS — M25561 Pain in right knee: Secondary | ICD-10-CM

## 2020-04-07 DIAGNOSIS — R262 Difficulty in walking, not elsewhere classified: Secondary | ICD-10-CM

## 2020-04-07 DIAGNOSIS — G8929 Other chronic pain: Secondary | ICD-10-CM

## 2020-04-07 NOTE — Therapy (Signed)
[facility] Physical Therapy [address] [place], [place], [address] Phone: [phone]   Fax:  [phone]  Physical Therapy Evaluation  Patient Details  Name: Brittany [name] MRN: [id number] Date of Birth: Dec 10, 1955 Referring Provider (PT): Lavada Mesi MD   Encounter Date: 04/07/2020   PT End of Session - 04/07/20 1236    Visit Number 1    Number of Visits 9    Date for PT Re-Evaluation 06/02/20    PT Start Time 0850    PT Stop Time 0928    PT Time Calculation (min) 38 min    Activity Tolerance Patient tolerated treatment well    Behavior During Therapy [facility] for tasks assessed/performed           Past Medical History:  Diagnosis Date  . Anxiety and depression   . Elevated LDL cholesterol level   . Thyroid disorder 11/14/2017    Past Surgical History:  Procedure Laterality Date  . LAPAROSCOPIC HYSTERECTOMY      There were no vitals filed for this visit.    Subjective Assessment - 04/07/20 0853    Subjective Pt arriving reporting R knee pain that extends up her R thigh and into her low back. Pt reporting a burning sensation. Pt states she is able to function but knows it's there. Pt reported in the past that her knee would lock, but that hasn't happened lately.    Pertinent History anxiety, thyroid    Patient Stated Goals Stop hurting    Currently in Pain? Yes    Pain Score 6     Pain Location Knee    Pain Orientation Right    Pain Descriptors / Indicators Burning    Pain Radiating Towards down thigh and into calf    Pain Onset More than a month ago    Pain Frequency Constant    Pain Relieving Factors working out makes it better    Effect of Pain on Daily Activities I can still do things, but I know its there.              [facility] PT Assessment - 04/07/20 0001      Assessment   Medical Diagnosis R knee pain, M25.561, M54.50    Referring Provider (PT) Hilts, Michael MD    Hand Dominance Right    Prior Therapy yes, knee pain,  years ago      Precautions   Precautions None      Restrictions   Weight Bearing Restrictions No      Balance Screen   Has the patient fallen in the past 6 months Yes    How many times? 2   fell getting out of car, missed a step in her home    Has the patient had a decrease in activity level because of a fear of falling?  No    Is the patient reluctant to leave their home because of a fear of falling?  No      Home Environment   Living Environment Private residence    Living Arrangements Alone    Type of Home House    Home Access Stairs to enter    Entrance Stairs-Number of Steps 14    Entrance Stairs-Rails Right    Home Layout Two level      Prior Function   Level of Independence Independent      Cognition   Overall Cognitive Status Within Functional Limits for tasks assessed      Observation/Other Assessments   Focus  on Therapeutic Outcomes (FOTO)  35% limitation   predicted 27%     Posture/Postural Control   Posture/Postural Control Postural limitations      ROM / Strength   AROM / PROM / Strength AROM;Strength      AROM   AROM Assessment Site Knee    Right/Left Knee Right;Left    Right Knee Extension 0    Right Knee Flexion 136    Left Knee Extension 0    Left Knee Flexion 138      Strength   Strength Assessment Site Hip;Knee    Right/Left Hip Right;Left    Right Hip Flexion 5/5    Right Hip Extension 5/5    Right Hip ABduction 4/5    Right Hip ADduction 4/5    Left Hip Flexion 5/5    Left Hip Extension 5/5    Left Hip ABduction 4/5    Left Hip ADduction 4/5      Flexibility   Soft Tissue Assessment /Muscle Length yes    Hamstrings WFL    Quadriceps WFL    Quadratus Lumborum tightness noted on R side      Transfers   Five time sit to stand comments  13.2 seconds no UE support   popping noted in R knee      Ambulation/Gait   Gait Pattern Within Functional Limits                      Objective measurements completed on  examination: See above findings.               PT Education - 04/07/20 0934    Education Details PT POC, HEP    Person(s) Educated Patient    Methods Explanation;Handout;Demonstration;Verbal cues;Tactile cues    Comprehension Verbalized understanding;Returned demonstration            PT Short Term Goals - 04/07/20 1259      PT SHORT TERM GOAL #1   Title Pt will be independent in her initial HEP.    Time 3    Period Weeks    Status New    Target Date 05/02/20      PT SHORT TERM GOAL #2   Title Pt will be able to report pain </= 4/10 with ADL's.    Time 3    Status New    Target Date 05/02/20             PT Long Term Goals - 04/07/20 1301      PT LONG TERM GOAL #1   Title Pt will improve her FOTO score from 35% limitation to </= 25% limitation.    Time 6    Period Weeks    Status New    Target Date 05/23/20      PT LONG TERM GOAL #2   Title Pt will be albe to report returning to gym classes with pain </= 2/10.    Baseline pain can vary    Time 6    Period Weeks    Status New    Target Date 05/23/20      PT LONG TERM GOAL #3   Title Pt will improve her bilateral hip strength to grossly 5/5 to improve functional mobility.    Time 6    Period Weeks    Status New    Target Date 05/23/20                  Plan - 04/07/20 1254  Clinical Impression Statement Pt arriving for PT evaluation today with 6/10 pain in her R knee. Pt presenting with decreased strength in bilateral hips, tigthness in R QL and IT band. Pt with tenderness noted along lateral joint line and tibial tuberosity. Skilled PT needed to address pt's impairments with the below interventions.    Examination-Activity Limitations Sleep;Other;Stairs;Stand    Examination-Participation Restrictions Other;Community Activity    Stability/Clinical Decision Making Stable/Uncomplicated    Clinical Decision Making Low    Rehab Potential Excellent    PT Frequency 1x / week    PT Duration 8  weeks    PT Treatment/Interventions Cryotherapy;Electrical Stimulation;Iontophoresis 4mg /ml Dexamethasone;Ultrasound;Moist Heat;Gait training;Stair training;Functional mobility training;Therapeutic activities;Therapeutic exercise;Balance training;Patient/family education;Neuromuscular re-education;Manual techniques;Passive range of motion;Dry needling;Taping    PT Next Visit Plan contiue to assess low back for involvement, LE stretching, strengthening    PT Home Exercise Plan [email or web address]    Consulted and Agree with Plan of Care Patient           Patient will benefit from skilled therapeutic intervention in order to improve the following deficits and impairments:  Pain, Decreased strength, Decreased activity tolerance, Difficulty walking  Visit Diagnosis: Chronic pain of right knee  Difficulty in walking, not elsewhere classified     Problem List Patient Active Problem List   Diagnosis Date Noted  . Popping sound of knee joint 04/01/2020  . Chronic pain of right knee 04/01/2020  . Vitamin D deficiency 04/01/2020  . Elevated LDL cholesterol level   . Anxiety and depression 11/14/2017  . Abnormal mammogram with microcalcification 11/14/2017  . Thyroid disorder 11/14/2017    01/15/2018, PT,MPT 04/07/2020, 4:42 PM  [facility] Physical Therapy [address] [place], [place], [place] Phone: [phone]   Fax:  [phone]  Name: Shaundra Fullam MRN: Levonne Spiller Date of Birth: [birth year]

## 2020-04-07 NOTE — Patient Instructions (Signed)
Access Code: [email or web address] URL: [email or web address] Date: 04/07/2020 Prepared by: Narda Amber  Exercises Supine Piriformis Stretch - 2-3 x daily - 7 x weekly - 3-5 reps - 30 seocnds hold Sidelying Hip Abduction - 2-3 x daily - 7 x weekly - 2 sets - 15 reps - 3 seconds hold Straight Leg Raise with External Rotation - 2-3 x daily - 7 x weekly - 2 sets - 15 reps - 3 seconds hold Supine Lower Trunk Rotation - 2-3 x daily - 7 x weekly - 3 reps - 30 seconds hold

## 2020-04-15 ENCOUNTER — Ambulatory Visit: Payer: BC Managed Care – PPO | Admitting: Physical Therapy

## 2020-04-15 ENCOUNTER — Other Ambulatory Visit: Payer: Self-pay

## 2020-04-15 ENCOUNTER — Encounter: Payer: Self-pay | Admitting: Physical Therapy

## 2020-04-15 DIAGNOSIS — G8929 Other chronic pain: Secondary | ICD-10-CM

## 2020-04-15 DIAGNOSIS — M25561 Pain in right knee: Secondary | ICD-10-CM

## 2020-04-15 DIAGNOSIS — R262 Difficulty in walking, not elsewhere classified: Secondary | ICD-10-CM | POA: Diagnosis not present

## 2020-04-15 NOTE — Therapy (Signed)
[facility] Physical Therapy [address] [place], [place], [address] Phone: [phone]   Fax:  [phone]  Physical Therapy Treatment  Patient Details  Name: Brittany [name] MRN: [id number] Date of Birth: 01-29-1956 Referring Provider (PT): Lavada Mesi MD   Encounter Date: 04/15/2020   PT End of Session - 04/15/20 1359    Visit Number 2    Number of Visits 9    Date for PT Re-Evaluation 06/02/20    PT Start Time 1144    PT Stop Time 1225    PT Time Calculation (min) 41 min    Activity Tolerance Patient tolerated treatment well    Behavior During Therapy [facility] for tasks assessed/performed           Past Medical History:  Diagnosis Date  . Anxiety and depression   . Elevated LDL cholesterol level   . Thyroid disorder 11/14/2017    Past Surgical History:  Procedure Laterality Date  . LAPAROSCOPIC HYSTERECTOMY      There were no vitals filed for this visit.   Subjective Assessment - 04/15/20 1146    Subjective feels better, the pain is less intense and frequent.    Pertinent History anxiety, thyroid    Patient Stated Goals Stop hurting    Currently in Pain? Yes    Pain Score 3     Pain Location Knee    Pain Orientation Right    Pain Descriptors / Indicators Tightness;Burning   "knot"   Pain Type Chronic pain    Pain Radiating Towards down thigh and into calf    Pain Onset More than a month ago    Pain Frequency Constant    Aggravating Factors  unknown    Pain Relieving Factors exercise                             [facility] Adult PT Treatment/Exercise - 04/15/20 1148      Exercises   Exercises Knee/Hip      Knee/Hip Exercises: Stretches   Piriformis Stretch Right;Left;3 reps;30 seconds    Other Knee/Hip Stretches lower trunk rotation single leg 3x30 sec      Knee/Hip Exercises: Aerobic   Nustep L5 x 6 min      Knee/Hip Exercises: Machines for Strengthening   Cybex Knee Extension 15# 2x10    Cybex Leg Press 106#  2x10 bil push; SL 50# 2x10 bil      Knee/Hip Exercises: Seated   Other Seated Knee/Hip Exercises seated SLR x20 reps bil      Knee/Hip Exercises: Supine   Bridges Both;20 reps    Straight Leg Raise with External Rotation Both;2 sets;15 reps      Knee/Hip Exercises: Sidelying   Hip ABduction Both;15 reps    Hip ABduction Limitations with slight hip extension                    PT Short Term Goals - 04/07/20 1259      PT SHORT TERM GOAL #1   Title Pt will be independent in her initial HEP.    Time 3    Period Weeks    Status New    Target Date 05/02/20      PT SHORT TERM GOAL #2   Title Pt will be able to report pain </= 4/10 with ADL's.    Time 3    Status New    Target Date 05/02/20  PT Long Term Goals - 04/07/20 1301      PT LONG TERM GOAL #1   Title Pt will improve her FOTO score from 35% limitation to </= 25% limitation.    Time 6    Period Weeks    Status New    Target Date 05/23/20      PT LONG TERM GOAL #2   Title Pt will be albe to report returning to gym classes with pain </= 2/10.    Baseline pain can vary    Time 6    Period Weeks    Status New    Target Date 05/23/20      PT LONG TERM GOAL #3   Title Pt will improve her bilateral hip strength to grossly 5/5 to improve functional mobility.    Time 6    Period Weeks    Status New    Target Date 05/23/20                 Plan - 04/15/20 1359    Clinical Impression Statement Pt overall reporting decreased intensity and frequency in pain with consistent exercise and HEP compliance.  Encouraged return to body pump and yoga as able, and noted some hypermobility today so would like to focus on strengthening.  She has some point tenderness and trigger points in medial distal hamstring, and medial proximal gastroc so provided tennis ball for self mobilization.  All goals ongoing at this time, will continue to benefit from PT to maximize function.    Examination-Activity  Limitations Sleep;Other;Stairs;Stand    Examination-Participation Restrictions Other;Community Activity    Stability/Clinical Decision Making Stable/Uncomplicated    Rehab Potential Excellent    PT Frequency 1x / week    PT Duration 8 weeks    PT Treatment/Interventions Cryotherapy;Electrical Stimulation;Iontophoresis 4mg /ml Dexamethasone;Ultrasound;Moist Heat;Gait training;Stair training;Functional mobility training;Therapeutic activities;Therapeutic exercise;Balance training;Patient/family education;Neuromuscular re-education;Manual techniques;Passive range of motion;Dry needling;Taping    PT Next Visit Plan focus on LE strengthening, manual PRN for trigger points    PT Home Exercise Plan [email or web address]    Consulted and Agree with Plan of Care Patient           Patient will benefit from skilled therapeutic intervention in order to improve the following deficits and impairments:  Pain, Decreased strength, Decreased activity tolerance, Difficulty walking  Visit Diagnosis: Chronic pain of right knee  Difficulty in walking, not elsewhere classified     Problem List Patient Active Problem List   Diagnosis Date Noted  . Popping sound of knee joint 04/01/2020  . Chronic pain of right knee 04/01/2020  . Vitamin D deficiency 04/01/2020  . Elevated LDL cholesterol level   . Anxiety and depression 11/14/2017  . Abnormal mammogram with microcalcification 11/14/2017  . Thyroid disorder 11/14/2017      01/15/2018, PT, DPT 04/15/20 2:03 PM    [facility] Physical Therapy [address] [place], [place], [place] Phone: [phone]   Fax:  [phone]  Name: Brittany [name] MRN: Levonne Spiller Date of Birth: 1955-08-18

## 2020-04-22 ENCOUNTER — Other Ambulatory Visit: Payer: Self-pay

## 2020-04-22 ENCOUNTER — Ambulatory Visit: Payer: BC Managed Care – PPO | Admitting: Physical Therapy

## 2020-04-22 ENCOUNTER — Encounter: Payer: Self-pay | Admitting: Physical Therapy

## 2020-04-22 DIAGNOSIS — M25561 Pain in right knee: Secondary | ICD-10-CM

## 2020-04-22 DIAGNOSIS — R262 Difficulty in walking, not elsewhere classified: Secondary | ICD-10-CM | POA: Diagnosis not present

## 2020-04-22 DIAGNOSIS — G8929 Other chronic pain: Secondary | ICD-10-CM | POA: Diagnosis not present

## 2020-04-22 NOTE — Therapy (Signed)
[facility] Physical Therapy [address] [place], [place], [address] Phone: [phone]   Fax:  [phone]  Physical Therapy Treatment  Patient Details  Name: Brittany [name] MRN: [id number] Date of Birth: 1955/06/15 Referring Provider (PT): Lavada Mesi MD   Encounter Date: 04/22/2020   PT End of Session - 04/22/20 1304    Visit Number 3    Number of Visits 9    Date for PT Re-Evaluation 06/02/20    PT Start Time 1300    PT Stop Time 1340    PT Time Calculation (min) 40 min    Activity Tolerance Patient tolerated treatment well    Behavior During Therapy [facility] for tasks assessed/performed           Past Medical History:  Diagnosis Date   Anxiety and depression    Elevated LDL cholesterol level    Thyroid disorder 11/14/2017    Past Surgical History:  Procedure Laterality Date   LAPAROSCOPIC HYSTERECTOMY      There were no vitals filed for this visit.   Subjective Assessment - 04/22/20 1303    Subjective Pt arriving reporting intermittent pain of 3.5/10 at times.    Pertinent History anxiety, thyroid    Patient Stated Goals Stop hurting    Currently in Pain? Yes    Pain Score 4     Pain Location Knee    Pain Orientation Right    Pain Descriptors / Indicators Tightness;Burning    Pain Type Chronic pain    Pain Onset More than a month ago    Pain Frequency Intermittent                             [facility] Adult PT Treatment/Exercise - 04/22/20 0001      Knee/Hip Exercises: Stretches   Piriformis Stretch Right;Left;3 reps;30 seconds    Other Knee/Hip Stretches lower trunk rotation single leg 3x30 sec      Knee/Hip Exercises: Aerobic   Recumbent Bike L3 6 minutes      Knee/Hip Exercises: Machines for Strengthening   Cybex Knee Extension 15# 2x 15    Cybex Knee Flexion 25# 2 x 15    Cybex Leg Press 106# 2x10 bil push; SL 50# 2x10 bil      Knee/Hip Exercises: Seated   Other Seated Knee/Hip Exercises seated SLR  x20 reps bil      Knee/Hip Exercises: Supine   Bridges Both;20 reps    Straight Leg Raise with External Rotation Both;2 sets;15 reps      Knee/Hip Exercises: Sidelying   Hip ABduction Both;15 reps      Manual Therapy   Manual therapy comments IASTM to posterior hamstring tendons and R IT band                    PT Short Term Goals - 04/22/20 1305      PT SHORT TERM GOAL #1   Title Pt will be independent in her initial HEP.    Status On-going      PT SHORT TERM GOAL #2   Title Pt will be able to report pain </= 4/10 with ADL's.    Status On-going             PT Long Term Goals - 04/07/20 1301      PT LONG TERM GOAL #1   Title Pt will improve her FOTO score from 35% limitation to </= 25% limitation.  Time 6    Period Weeks    Status New    Target Date 05/23/20      PT LONG TERM GOAL #2   Title Pt will be albe to report returning to gym classes with pain </= 2/10.    Baseline pain can vary    Time 6    Period Weeks    Status New    Target Date 05/23/20      PT LONG TERM GOAL #3   Title Pt will improve her bilateral hip strength to grossly 5/5 to improve functional mobility.    Time 6    Period Weeks    Status New    Target Date 05/23/20                 Plan - 04/22/20 1306    Clinical Impression Statement Pt arriving today reporting intermittent pain up to 4/10 at times in her R knee. Pt compliant with her HEP. Pt stating she has returned to body pump/yoga doing less weight but is tolerating fine. IASTM performed to distal hamstring and IT band on R LE. Continue skilled PT.    Examination-Activity Limitations Sleep;Other;Stairs;Stand    Examination-Participation Restrictions Other;Community Activity    Stability/Clinical Decision Making Stable/Uncomplicated    Rehab Potential Excellent    PT Frequency 1x / week    PT Duration 8 weeks    PT Treatment/Interventions Cryotherapy;Electrical Stimulation;Iontophoresis 4mg /ml  Dexamethasone;Ultrasound;Moist Heat;Gait training;Stair training;Functional mobility training;Therapeutic activities;Therapeutic exercise;Balance training;Patient/family education;Neuromuscular re-education;Manual techniques;Passive range of motion;Dry needling;Taping    PT Next Visit Plan focus on LE strengthening, manual PRN for trigger points    PT Home Exercise Plan [email or web address]    Consulted and Agree with Plan of Care Patient           Patient will benefit from skilled therapeutic intervention in order to improve the following deficits and impairments:  Pain,Decreased strength,Decreased activity tolerance,Difficulty walking  Visit Diagnosis: Chronic pain of right knee  Difficulty in walking, not elsewhere classified     Problem List Patient Active Problem List   Diagnosis Date Noted   Popping sound of knee joint 04/01/2020   Chronic pain of right knee 04/01/2020   Vitamin D deficiency 04/01/2020   Elevated LDL cholesterol level    Anxiety and depression 11/14/2017   Abnormal mammogram with microcalcification 11/14/2017   Thyroid disorder 11/14/2017    01/15/2018, PT, MPT 04/22/2020, 1:51 PM  [facility] Physical Therapy [address] [place], [place], [place] Phone: [phone]   Fax:  [phone]  Name: Brittany [name] MRN: Levonne Spiller Date of Birth: 1955/10/29

## 2020-04-28 ENCOUNTER — Encounter: Payer: Self-pay | Admitting: Obstetrics & Gynecology

## 2020-04-28 ENCOUNTER — Ambulatory Visit: Payer: BC Managed Care – PPO | Admitting: Obstetrics & Gynecology

## 2020-04-28 ENCOUNTER — Other Ambulatory Visit: Payer: Self-pay

## 2020-04-28 VITALS — BP 116/72 | Ht 65.25 in | Wt 164.2 lb

## 2020-04-28 DIAGNOSIS — Z13820 Encounter for screening for osteoporosis: Secondary | ICD-10-CM | POA: Diagnosis not present

## 2020-04-28 DIAGNOSIS — Z1272 Encounter for screening for malignant neoplasm of vagina: Secondary | ICD-10-CM

## 2020-04-28 DIAGNOSIS — Z01419 Encounter for gynecological examination (general) (routine) without abnormal findings: Secondary | ICD-10-CM | POA: Diagnosis not present

## 2020-04-28 DIAGNOSIS — Z780 Asymptomatic menopausal state: Secondary | ICD-10-CM

## 2020-04-28 DIAGNOSIS — Z90710 Acquired absence of both cervix and uterus: Secondary | ICD-10-CM | POA: Diagnosis not present

## 2020-04-28 NOTE — Progress Notes (Signed)
Brittany [name] 1955-08-06 [id number]   History:    64 y.o. G1P1L1 Single.  Daughter 30 you in [place], [place].  Patient's family in [place].  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  Postmenopause, well on no HRT.  No pelvic pain.  Abstinent x about 10 yrs.  Urine/BMs normal.  Breasts normal.  BMI 27.12. Physically active. Health labs with Fam MD.  Alen Bleacher 2019.    Past medical history,surgical history, family history and social history were all reviewed and documented in the EPIC chart.  Gynecologic History No LMP recorded. Patient has had a hysterectomy.  Obstetric History OB History  Gravida Para Term Preterm AB Living  1 1       1   SAB IAB Ectopic Multiple Live Births               # Outcome Date GA Lbr Len/2nd Weight Sex Delivery Anes PTL Lv  1 Para              ROS: A ROS was performed and pertinent positives and negatives are included in the history.  GENERAL: No fevers or chills. HEENT: No change in vision, no earache, sore throat or sinus congestion. NECK: No pain or stiffness. CARDIOVASCULAR: No chest pain or pressure. No palpitations. PULMONARY: No shortness of breath, cough or wheeze. GASTROINTESTINAL: No abdominal pain, nausea, vomiting or diarrhea, melena or bright red blood per rectum. GENITOURINARY: No urinary frequency, urgency, hesitancy or dysuria. MUSCULOSKELETAL: No joint or muscle pain, no back pain, no recent trauma. DERMATOLOGIC: No rash, no itching, no lesions. ENDOCRINE: No polyuria, polydipsia, no heat or cold intolerance. No recent change in weight. HEMATOLOGICAL: No anemia or easy bruising or bleeding. NEUROLOGIC: No headache, seizures, numbness, tingling or weakness. PSYCHIATRIC: No depression, no loss of interest in normal activity or change in sleep pattern.     Exam:   BP 116/72    Ht 5' 5.25" (1.657 m)    Wt 164 lb 3.2 oz (74.5 kg)    BMI 27.12 kg/m   Body mass index  is 27.12 kg/m.  General appearance : Well developed well nourished female. No acute distress HEENT: Eyes: no retinal hemorrhage or exudates,  Neck supple, trachea midline, no carotid bruits, no thyroidmegaly Lungs: Clear to auscultation, no rhonchi or wheezes, or rib retractions  Heart: Regular rate and rhythm, no murmurs or gallops Breast:Examined in sitting and supine position were symmetrical in appearance, no palpable masses or tenderness,  no skin retraction, no nipple inversion, no nipple discharge, no skin discoloration, no axillary or supraclavicular lymphadenopathy Abdomen: no palpable masses or tenderness, no rebound or guarding Extremities: no edema or skin discoloration or tenderness  Pelvic: Vulva: Normal             Vagina: No gross lesions or discharge.  Pap reflex done.  Cervix/Uterus absent  Adnexa  Without masses or tenderness  Anus: Normal   Assessment/Plan:  64 y.o. female for annual exam   1. Encounter for Papanicolaou smear of vagina as part of routine gynecological examination Gynecologic exam s/p Total Hysterectomy.  Pap test done at the vaginal vault.  Breast exam normal.  Screening mammo negative 01/2020.  Colono 2019.  BMI 27.12.  Continue with fitness.  Lower carb/bad cholesterol diet with more veggies.  Health labs with Fam MD.  2. S/P total hysterectomy  3. Postmenopause Well on no HRT.  4. Screening for osteoporosis Will schedule  a BD here.  Vit D supplements to continue, Ca++ 1500 mg total daily, weightbearing physical activities on a regular basis. - DG Bone Density; Future  Other orders - OVER THE COUNTER MEDICATION; Holy basil -   5 times a week - Selenium 100 MCG TABS; Take by mouth.  Genia Del MD, 8:40 AM 04/28/2020

## 2020-04-29 ENCOUNTER — Encounter: Payer: Self-pay | Admitting: Physical Therapy

## 2020-04-29 ENCOUNTER — Ambulatory Visit (INDEPENDENT_AMBULATORY_CARE_PROVIDER_SITE_OTHER): Payer: BC Managed Care – PPO | Admitting: Physical Therapy

## 2020-04-29 DIAGNOSIS — R262 Difficulty in walking, not elsewhere classified: Secondary | ICD-10-CM

## 2020-04-29 DIAGNOSIS — G8929 Other chronic pain: Secondary | ICD-10-CM

## 2020-04-29 DIAGNOSIS — M25561 Pain in right knee: Secondary | ICD-10-CM | POA: Diagnosis not present

## 2020-04-29 LAB — PAP IG W/ RFLX HPV ASCU

## 2020-04-29 NOTE — Therapy (Signed)
[facility] [address] [place], [place], [address] Phone: [phone]   Fax:  [phone]  Physical Therapy Treatment  Patient Details  Name: Brittany [name] MRN: [id number] Date of Birth: Oct 29, 1955 Referring Provider (PT): Lavada Mesi MD   Encounter Date: 04/29/2020   PT End of Session - 04/29/20 1526    Visit Number 4    Number of Visits 9    Date for PT Re-Evaluation 06/02/20    PT Start Time 1518    PT Stop Time 1600    PT Time Calculation (min) 42 min    Activity Tolerance Patient tolerated treatment well    Behavior During Therapy [facility] for tasks assessed/performed           Past Medical History:  Diagnosis Date  . Anxiety and depression   . Elevated LDL cholesterol level   . Thyroid disorder 11/14/2017    Past Surgical History:  Procedure Laterality Date  . ABDOMINAL HYSTERECTOMY  03/2017   VAGINAL HYSTERECTOMY WTH OVARIAN PRESERVATION   . HERNIA REPAIR    . LAPAROSCOPIC HYSTERECTOMY      There were no vitals filed for this visit.   Subjective Assessment - 04/29/20 1524    Subjective Pt arriving today reporting this morning her pain was 4/10 and currently it's about 3/10 in back of R knee.    Pertinent History anxiety, thyroid    Patient Stated Goals Stop hurting    Currently in Pain? Yes    Pain Score 3     Pain Location Knee    Pain Orientation Right    Pain Descriptors / Indicators Sore;Aching    Pain Type Chronic pain    Pain Onset More than a month ago                             [facility] Adult PT Treatment/Exercise - 04/29/20 0001      Knee/Hip Exercises: Stretches   Piriformis Stretch Right;Left;3 reps;30 seconds    Other Knee/Hip Stretches lower trunk rotation single leg 3x30 sec      Knee/Hip Exercises: Aerobic   Recumbent Bike L3 6 minutes      Knee/Hip Exercises: Machines for Strengthening   Cybex Knee Extension 15# 2x 15    Cybex Knee Flexion 25# 2 x 15    Cybex Leg Press  112# 2x10 bil push; SL 50# 2x10 bil      Knee/Hip Exercises: Prone   Straight Leg Raises Both;2 sets;10 reps    Other Prone Exercises prone press ups x 3 holding 30 seconds      Manual Therapy   Manual therapy comments IASTM to posterior hamstring tendons and R IT band   8 minutes                   PT Short Term Goals - 04/29/20 1536      PT SHORT TERM GOAL #1   Title Pt will be independent in her initial HEP.    Status On-going      PT SHORT TERM GOAL #2   Title Pt will be able to report pain </= 4/10 with ADL's.    Status On-going             PT Long Term Goals - 04/29/20 1537      PT LONG TERM GOAL #1   Title Pt will improve her FOTO score from 35% limitation to </= 25% limitation.  Status On-going      PT LONG TERM GOAL #2   Title Pt will be albe to report returning to gym classes with pain </= 2/10.    Status On-going      PT LONG TERM GOAL #3   Title Pt will improve her bilateral hip strength to grossly 5/5 to improve functional mobility.    Status On-going                 Plan - 04/29/20 1527    Clinical Impression Statement Pt arriving to therapy reporting 3-4/10 pain throughout the day in her R posterior knee. Pt also reporting sharp pain on her inner thigh at times. Pt reports body pump and yoga are going well. Focusing on lumbar stretching. IASTM to R IT band and posterior knee in prone position. Continue skilled PT.    Examination-Activity Limitations Sleep;Other;Stairs;Stand    Examination-Participation Restrictions Other;Community Activity    Stability/Clinical Decision Making Stable/Uncomplicated    Rehab Potential Excellent    PT Frequency 1x / week    PT Duration 8 weeks    PT Treatment/Interventions Cryotherapy;Electrical Stimulation;Iontophoresis 4mg /ml Dexamethasone;Ultrasound;Moist Heat;Gait training;Stair training;Functional mobility training;Therapeutic activities;Therapeutic exercise;Balance training;Patient/family  education;Neuromuscular re-education;Manual techniques;Passive range of motion;Dry needling;Taping    PT Next Visit Plan focus on LE strengthening, manual PRN for trigger points    PT Home Exercise Plan [email or web address]    Consulted and Agree with Plan of Care Patient           Patient will benefit from skilled therapeutic intervention in order to improve the following deficits and impairments:  Pain,Decreased strength,Decreased activity tolerance,Difficulty walking  Visit Diagnosis: Chronic pain of right knee  Difficulty in walking, not elsewhere classified     Problem List Patient Active Problem List   Diagnosis Date Noted  . Popping sound of knee joint 04/01/2020  . Chronic pain of right knee 04/01/2020  . Vitamin D deficiency 04/01/2020  . Elevated LDL cholesterol level   . Anxiety and depression 11/14/2017  . Abnormal mammogram with microcalcification 11/14/2017  . Thyroid disorder 11/14/2017    01/15/2018, PT, MPT 04/29/2020, 3:57 PM  [facility] [address] [place], [place], [place] Phone: [phone]   Fax:  [phone]  Name: Sakiyah Shur MRN: Levonne Spiller Date of Birth: [birth year]

## 2020-04-30 ENCOUNTER — Other Ambulatory Visit: Payer: Self-pay | Admitting: Family Medicine

## 2020-05-12 ENCOUNTER — Encounter: Payer: BC Managed Care – PPO | Admitting: Physical Therapy

## 2020-05-14 ENCOUNTER — Encounter: Payer: Self-pay | Admitting: Internal Medicine

## 2020-05-14 LAB — HM DIABETES EYE EXAM

## 2020-05-19 ENCOUNTER — Encounter: Payer: Self-pay | Admitting: Physical Therapy

## 2020-05-19 ENCOUNTER — Other Ambulatory Visit: Payer: Self-pay

## 2020-05-19 ENCOUNTER — Ambulatory Visit: Payer: Self-pay | Admitting: Physical Therapy

## 2020-05-19 DIAGNOSIS — G8929 Other chronic pain: Secondary | ICD-10-CM

## 2020-05-19 DIAGNOSIS — R262 Difficulty in walking, not elsewhere classified: Secondary | ICD-10-CM

## 2020-05-19 DIAGNOSIS — M25561 Pain in right knee: Secondary | ICD-10-CM

## 2020-05-19 NOTE — Therapy (Addendum)
[facility] [address] [place], [place], [address] Phone: [phone]   Fax:  [phone]  Physical Therapy Treatment  Patient Details  Name: Brittany [name] MRN: [id number] Date of Birth: 07-03-1955 Referring Provider (PT): Eunice Blase MD   Encounter Date: 05/19/2020   PT End of Session - 05/19/20 1312    Visit Number 5    Number of Visits 9    Date for PT Re-Evaluation 06/02/20    PT Start Time 1300    PT Stop Time 1340    PT Time Calculation (min) 40 min    Activity Tolerance Patient tolerated treatment well    Behavior During Therapy [facility] for tasks assessed/performed           Past Medical History:  Diagnosis Date  . Anxiety and depression   . Elevated LDL cholesterol level   . Thyroid disorder 11/14/2017    Past Surgical History:  Procedure Laterality Date  . ABDOMINAL HYSTERECTOMY  03/2017   VAGINAL HYSTERECTOMY WTH OVARIAN PRESERVATION   . HERNIA REPAIR    . LAPAROSCOPIC HYSTERECTOMY      There were no vitals filed for this visit.   Subjective Assessment - 05/19/20 1310    Subjective Pt arriving today reporting no pain at rest. Pt reporting her pin point pain hasn't bothered her in the last week.    Pertinent History anxiety, thyroid    Patient Stated Goals Stop hurting    Currently in Pain? No/denies                             [facility] Adult PT Treatment/Exercise - 05/19/20 0001      Knee/Hip Exercises: Stretches   Piriformis Stretch Right;Left;3 reps;30 seconds    Other Knee/Hip Stretches lower trunk rotation single leg 3x30 sec      Knee/Hip Exercises: Aerobic   Recumbent Bike L3 6 minutes      Knee/Hip Exercises: Machines for Strengthening   Cybex Knee Extension 15# 2x 15    Cybex Knee Flexion 25# 2 x 15    Cybex Leg Press 112# 2x10 bil push; SL 50# 2x15 bil      Knee/Hip Exercises: Standing   SLS with Vectors R and L LE: 2 cone position      Knee/Hip Exercises: Prone   Straight Leg  Raises Both;2 sets;10 reps    Other Prone Exercises prone press ups x 3 holding 30 seconds      Manual Therapy   Manual therapy comments IASTM to posterior hamstring tendons and R IT band   8 minutes                   PT Short Term Goals - 05/19/20 1316      PT SHORT TERM GOAL #1   Title Pt will be independent in her initial HEP.    Status Achieved      PT SHORT TERM GOAL #2   Title Pt will be able to report pain </= 4/10 with ADL's.    Baseline </= 2/10 with ADL's.    Status Achieved             PT Long Term Goals - 04/29/20 1537      PT LONG TERM GOAL #1   Title Pt will improve her FOTO score from 35% limitation to </= 25% limitation.    Status On-going      PT LONG TERM GOAL #2   Title  Pt will be albe to report returning to gym classes with pain </= 2/10.    Status On-going      PT LONG TERM GOAL #3   Title Pt will improve her bilateral hip strength to grossly 5/5 to improve functional mobility.    Status On-going                 Plan - 05/19/20 1313    Clinical Impression Statement Pt tolerating exericses well today focusing on R eccentric knee control. Pt making progress with more SLS activities. IASTM to posterior hamstring tendons.  Continue skilled PT.    Examination-Activity Limitations Sleep;Other;Stairs;Stand    Examination-Participation Restrictions Other;Community Activity    Stability/Clinical Decision Making Stable/Uncomplicated    Rehab Potential Excellent    PT Frequency 1x / week    PT Duration 8 weeks    PT Treatment/Interventions Cryotherapy;Electrical Stimulation;Iontophoresis 93m/ml Dexamethasone;Ultrasound;Moist Heat;Gait training;Stair training;Functional mobility training;Therapeutic activities;Therapeutic exercise;Balance training;Patient/family education;Neuromuscular re-education;Manual techniques;Passive range of motion;Dry needling;Taping    PT Next Visit Plan focus on LE strengthening, manual PRN for trigger points     PT Home Exercise Plan [email or web address]    Consulted and Agree with Plan of Care Patient           Patient will benefit from skilled therapeutic intervention in order to improve the following deficits and impairments:  Pain,Decreased strength,Decreased activity tolerance,Difficulty walking  Visit Diagnosis: Chronic pain of right knee  Difficulty in walking, not elsewhere classified  PHYSICAL THERAPY DISCHARGE SUMMARY  Visits from Start of Care: 5 of 9   Current functional level related to goals / functional outcomes: See above    Remaining deficits: see above See above   Education / Equipment: See above Plan: Patient agrees to discharge.  Patient goals were not met. Patient is being discharged due to not returning since the last visit.  ?????        Problem List Patient Active Problem List   Diagnosis Date Noted  . Popping sound of knee joint 04/01/2020  . Chronic pain of right knee 04/01/2020  . Vitamin D deficiency 04/01/2020  . Elevated LDL cholesterol level   . Anxiety and depression 11/14/2017  . Abnormal mammogram with microcalcification 11/14/2017  . Thyroid disorder 11/14/2017    JOretha Caprice, PT , MPT 05/19/2020, 1:43 PM JKearney Hard PT, MPT 06/23/20 1:31 PM   [facility] [address] [address]Phone: [phone]  Fax:  [phone] Name: Brittany DuffordMRN: [id number] of Birth: 3August 28, 1957

## 2020-06-03 ENCOUNTER — Other Ambulatory Visit: Payer: Self-pay | Admitting: Family Medicine

## 2020-06-18 IMAGING — MG DIGITAL DIAGNOSTIC BILATERAL MAMMOGRAM WITH TOMO AND CAD
6 of 10 series · 6 of 26 positions shown · non-contrast
Comparison: Previous exam(s).

CLINICAL DATA: Short-term follow-up for right breast calcifications
initially evaluated and an outside institution. Initial evaluation
was performed in September 2016.Patient has no current breast complaints.

EXAM:
DIGITAL DIAGNOSTIC BILATERAL MAMMOGRAM WITH CAD AND TOMO

[R ML]
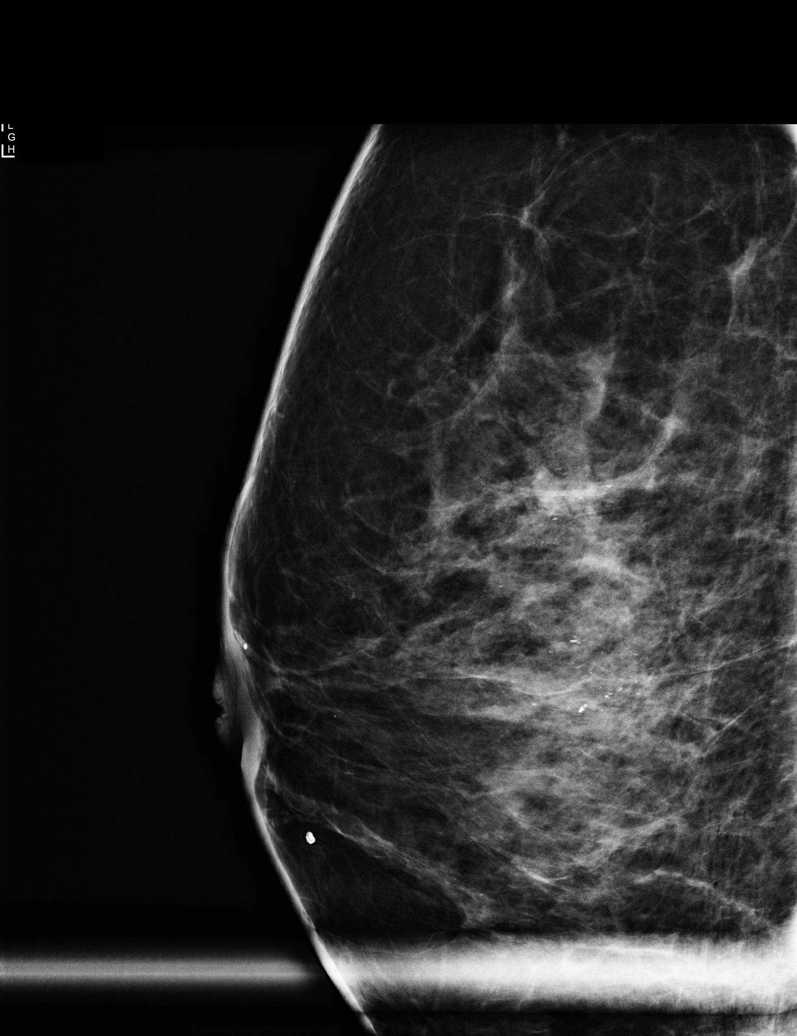

[R CC]
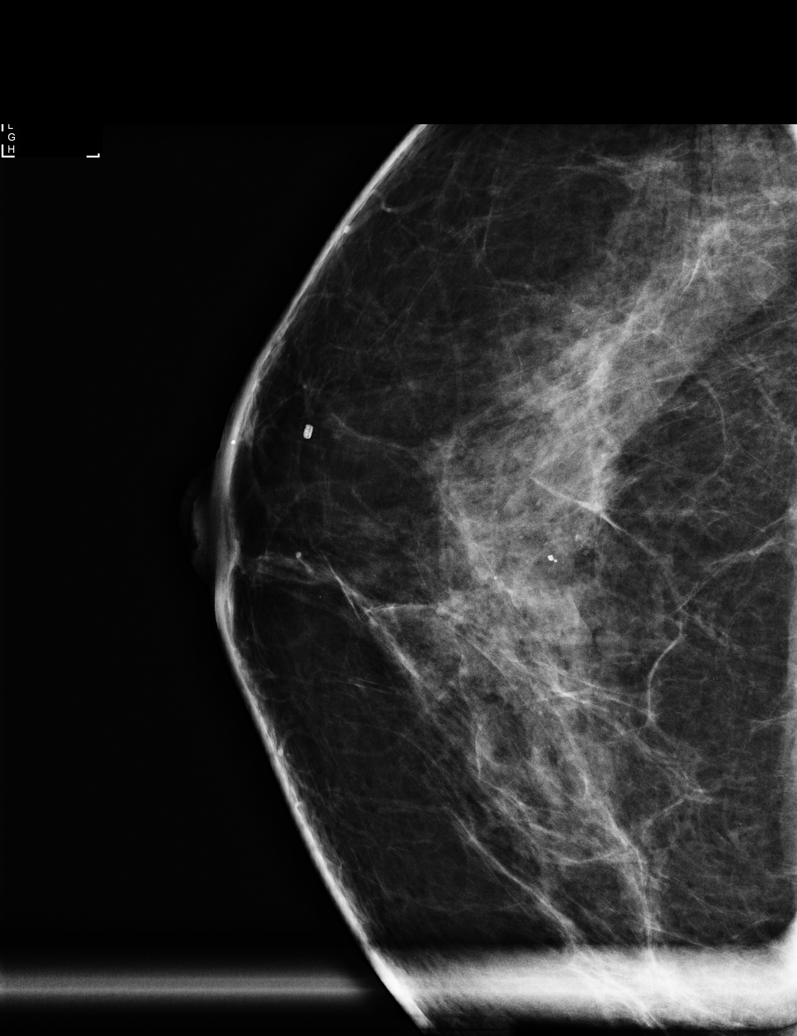

[R CC synth-2D]
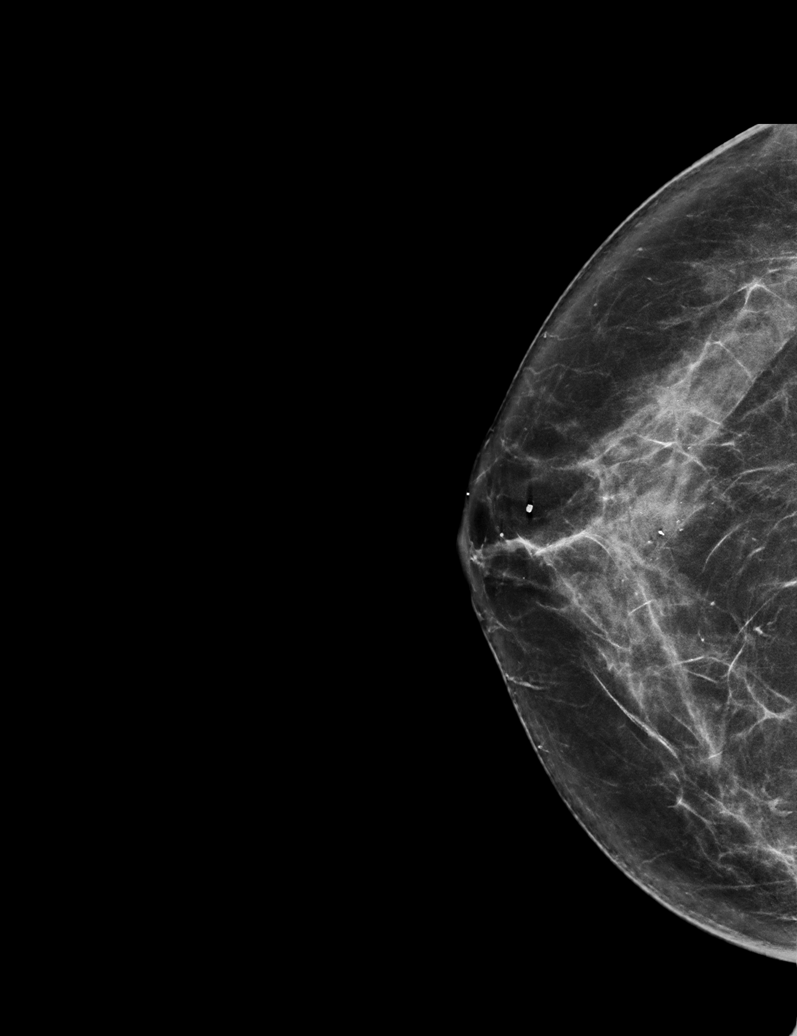

[L MLO synth-2D]
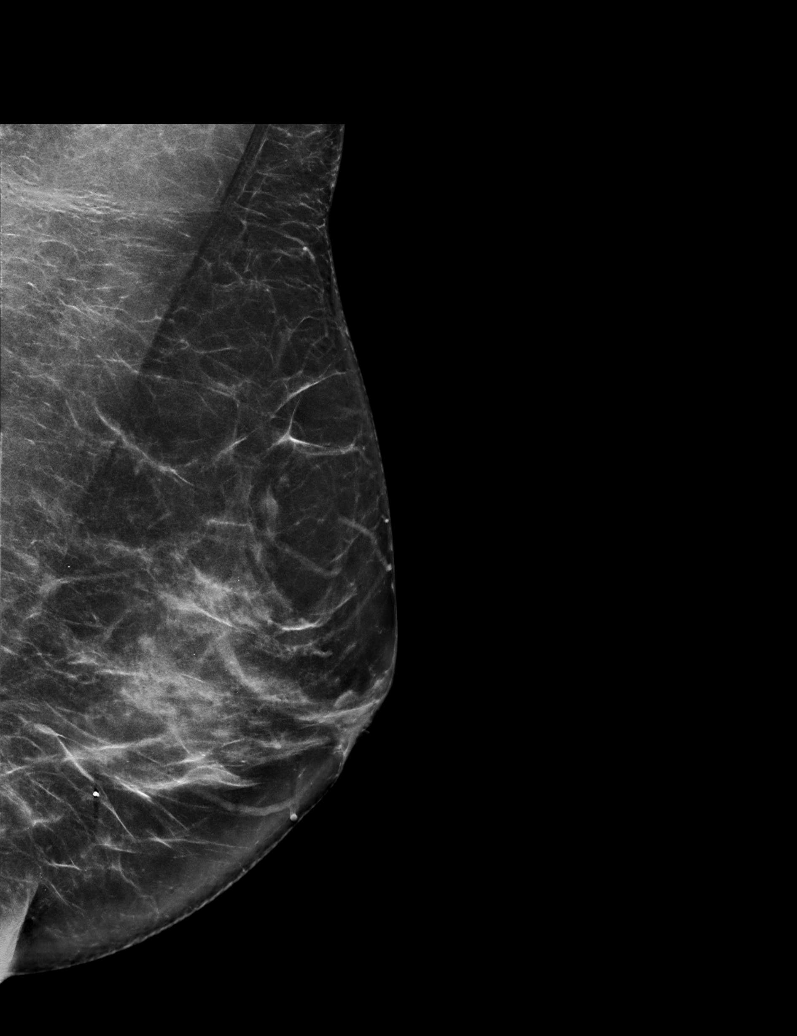

[R MLO synth-2D]
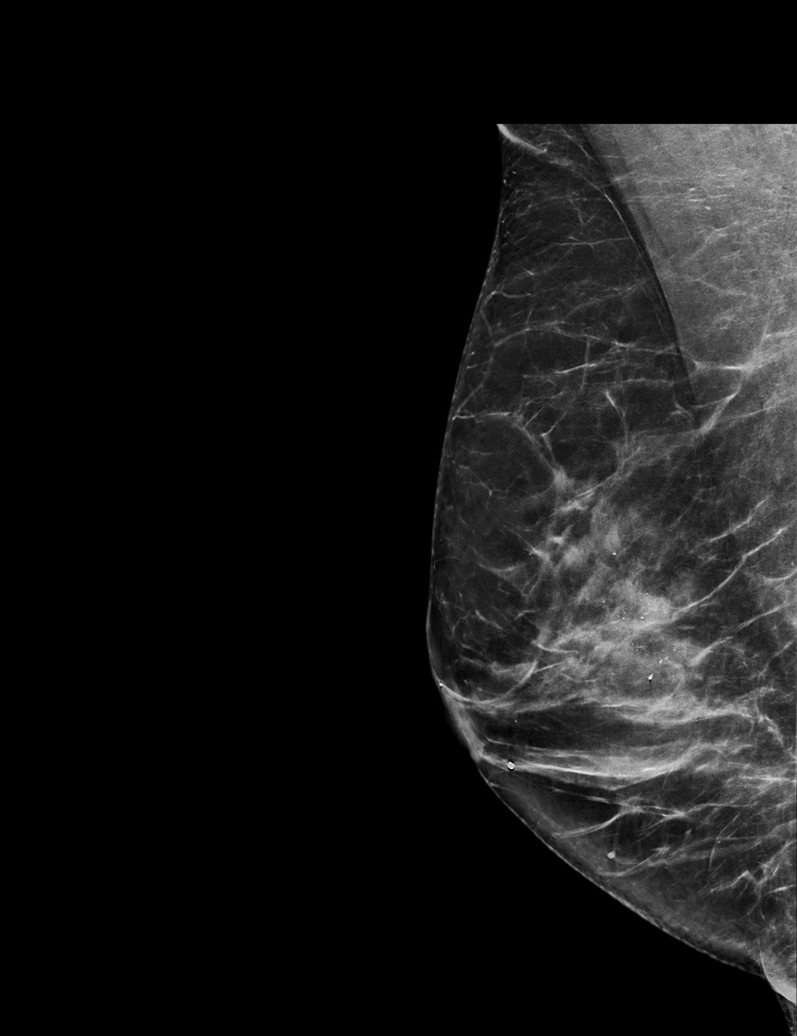

[L CC synth-2D]
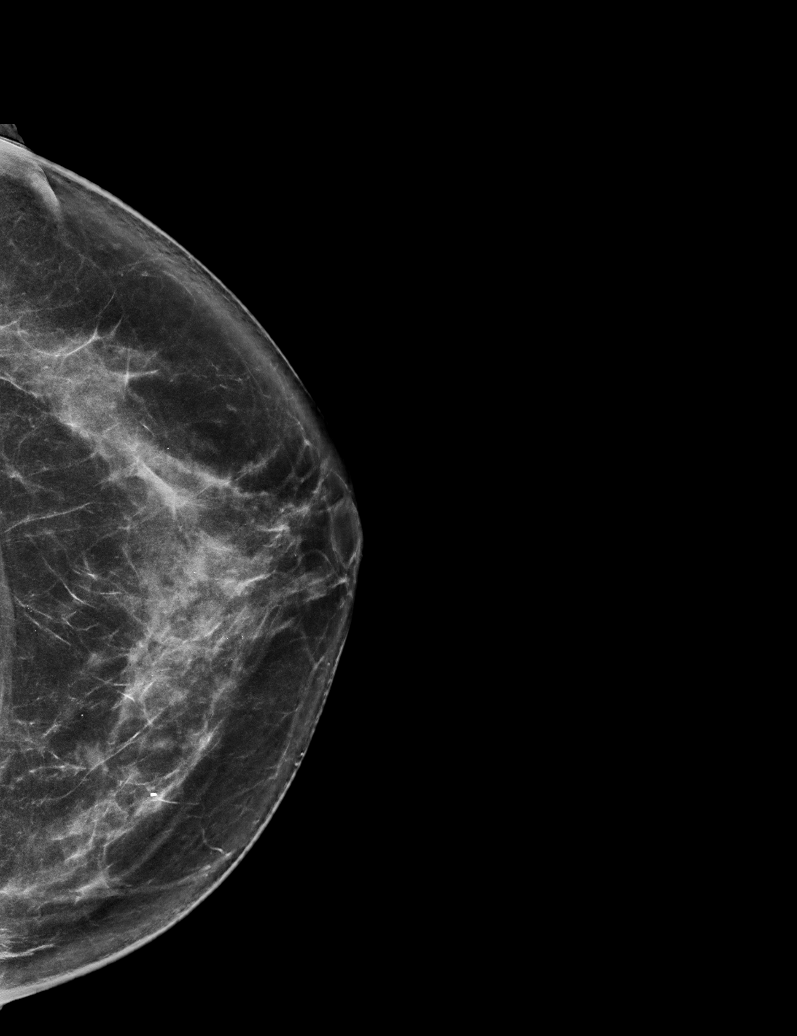

[6 of 26 positions shown; findings below may reference images not displayed]

ACR Breast Density Category c: The breast tissue is heterogeneously
dense, which may obscure small masses.
FINDINGS: Calcifications on the right are without change. Most of these layer
reflecting milk of calcium as a did on prior exams.

There are no discrete masses or areas of architectural distortion.
There are no new calcifications.

Mammographic images were processed with CAD.
IMPRESSION: 1. Probably benign right breast calcifications stable for 1 year. 1
additional follow-up diagnostic mammographic exam in 1 year is
recommended to provide 2 years of stability.
2. No new abnormalities.

RECOMMENDATION:
Diagnostic mammography in 1 year with right breast magnification
views.

I have discussed the findings and recommendations with the patient.
Results were also provided in writing at the conclusion of the
visit. If applicable, a reminder letter will be sent to the patient
regarding the next appointment.

BI-RADS CATEGORY  3: Probably benign.

## 2020-08-04 ENCOUNTER — Telehealth: Payer: BC Managed Care – PPO | Admitting: Emergency Medicine

## 2020-08-04 DIAGNOSIS — J329 Chronic sinusitis, unspecified: Secondary | ICD-10-CM | POA: Diagnosis not present

## 2020-08-04 MED ORDER — AMOXICILLIN-POT CLAVULANATE 875-125 MG PO TABS: 1.0000 | ORAL_TABLET | Freq: Two times a day (BID) | ORAL | 0 refills | Status: DC

## 2020-08-04 NOTE — Progress Notes (Signed)

## 2020-08-05 ENCOUNTER — Ambulatory Visit (INDEPENDENT_AMBULATORY_CARE_PROVIDER_SITE_OTHER): Payer: BC Managed Care – PPO

## 2020-08-05 ENCOUNTER — Other Ambulatory Visit: Payer: Self-pay

## 2020-08-05 ENCOUNTER — Other Ambulatory Visit: Payer: Self-pay | Admitting: Obstetrics & Gynecology

## 2020-08-05 DIAGNOSIS — Z780 Asymptomatic menopausal state: Secondary | ICD-10-CM

## 2020-08-05 DIAGNOSIS — Z13820 Encounter for screening for osteoporosis: Secondary | ICD-10-CM

## 2020-11-01 ENCOUNTER — Other Ambulatory Visit: Payer: Self-pay | Admitting: Family Medicine

## 2021-01-09 ENCOUNTER — Other Ambulatory Visit: Payer: Self-pay | Admitting: Family Medicine

## 2021-01-09 DIAGNOSIS — Z1231 Encounter for screening mammogram for malignant neoplasm of breast: Secondary | ICD-10-CM

## 2021-01-29 ENCOUNTER — Encounter: Payer: Self-pay | Admitting: Family Medicine

## 2021-01-29 ENCOUNTER — Other Ambulatory Visit: Payer: Self-pay

## 2021-01-29 ENCOUNTER — Ambulatory Visit: Payer: BC Managed Care – PPO | Admitting: Family Medicine

## 2021-01-29 VITALS — BP 118/78 | HR 72 | Temp 97.0°F | Wt 165.2 lb

## 2021-01-29 DIAGNOSIS — H9191 Unspecified hearing loss, right ear: Secondary | ICD-10-CM

## 2021-01-29 DIAGNOSIS — H6121 Impacted cerumen, right ear: Secondary | ICD-10-CM | POA: Diagnosis not present

## 2021-01-29 NOTE — Progress Notes (Signed)
   Subjective:    Patient ID: Brittany [name], female    DOB: December 22, 1955, 65 y.o.   MRN: [id number]  HPI Chief Complaint  Patient presents with   Ear Fullness    Bilateral ears right worse than left- started about 2 months ago.   Here with complaints of bilateral ear fullness and decreased hearing, worse on the right.  This has been ongoing for approximately 2 months. Occasional rhinorrhea and postnasal drainage, worse after eating.  No significant nasal congestion. No fever, chills, chest pain, palpitations, shortness of breath.  States she has an appointment next month with her ENT.   Review of Systems Pertinent positives and negatives in the history of present illness.     Objective:   Physical Exam BP 118/78   Pulse 72   Temp (!) 97 F (36.1 C) (Tympanic)   Wt 165 lb 3.2 oz (74.9 kg)   SpO2 97%   BMI 27.28 kg/m    Alert and in no distress. Cerumen impaction worse on right pre ear lavage. Post lavage canals and TMs normal appearing but mild erythema in right canal. Respirations unlabored.       Assessment & Plan:  Decreased hearing of right ear  Impacted cerumen of right ear  Cerumen impaction- lavage performed by CMA.  Reports improved hearing and ear fullness post lavage.  Recommend she keep her ENT appointment next month since she has chronic rhinorrhea and post nasal drainage

## 2021-02-18 ENCOUNTER — Other Ambulatory Visit: Payer: Self-pay

## 2021-02-18 ENCOUNTER — Ambulatory Visit
Admission: RE | Admit: 2021-02-18 | Discharge: 2021-02-18 | Disposition: A | Discharge status: Home / Self Care | Payer: BC Managed Care – PPO | Source: Ambulatory Visit | Attending: Family Medicine | Admitting: Family Medicine

## 2021-02-18 DIAGNOSIS — Z1231 Encounter for screening mammogram for malignant neoplasm of breast: Secondary | ICD-10-CM

## 2021-02-18 DIAGNOSIS — H6123 Impacted cerumen, bilateral: Secondary | ICD-10-CM | POA: Insufficient documentation

## 2021-04-06 ENCOUNTER — Encounter: Payer: BC Managed Care – PPO | Admitting: Family Medicine

## 2021-04-20 ENCOUNTER — Ambulatory Visit: Payer: BC Managed Care – PPO | Admitting: Obstetrics & Gynecology

## 2021-05-12 ENCOUNTER — Encounter: Payer: Self-pay | Admitting: Obstetrics & Gynecology

## 2021-05-12 ENCOUNTER — Ambulatory Visit (INDEPENDENT_AMBULATORY_CARE_PROVIDER_SITE_OTHER): Payer: BC Managed Care – PPO | Admitting: Obstetrics & Gynecology

## 2021-05-12 ENCOUNTER — Other Ambulatory Visit: Payer: Self-pay

## 2021-05-12 VITALS — BP 114/70 | HR 69 | Resp 16 | Ht 64.25 in | Wt 167.0 lb

## 2021-05-12 DIAGNOSIS — Z90710 Acquired absence of both cervix and uterus: Secondary | ICD-10-CM

## 2021-05-12 DIAGNOSIS — Z780 Asymptomatic menopausal state: Secondary | ICD-10-CM | POA: Diagnosis not present

## 2021-05-12 DIAGNOSIS — Z01419 Encounter for gynecological examination (general) (routine) without abnormal findings: Secondary | ICD-10-CM | POA: Diagnosis not present

## 2021-05-12 NOTE — Progress Notes (Signed)
Brittany [name] Aug 08, 1955 [id number]   History:    66 y.o. G1P1L1 Single.  Daughter 20 you in [place], [place].  Patient's family in [place].   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  Postmenopause, well on no HRT.  No pelvic pain.  Abstinent x more than 10 yrs.  Pap 04/2020 Neg.  Urine/BMs normal.  Breasts normal.  Mammo Neg 02/2021. BD Normal 07/2020. BMI 28.44. Physically active. Health labs with Fam MD.  Brittany [name] 05/2017.     Past medical history,surgical history, family history and social history were all reviewed and documented in the EPIC chart.  Gynecologic History No LMP recorded. Patient has had a hysterectomy.  Obstetric History OB History  Gravida Para Term Preterm AB Living  1 1       1   SAB IAB Ectopic Multiple Live Births               # Outcome Date GA Lbr Len/2nd Weight Sex Delivery Anes PTL Lv  1 Para              ROS: A ROS was performed and pertinent positives and negatives are included in the history.  GENERAL: No fevers or chills. HEENT: No change in vision, no earache, sore throat or sinus congestion. NECK: No pain or stiffness. CARDIOVASCULAR: No chest pain or pressure. No palpitations. PULMONARY: No shortness of breath, cough or wheeze. GASTROINTESTINAL: No abdominal pain, nausea, vomiting or diarrhea, melena or bright red blood per rectum. GENITOURINARY: No urinary frequency, urgency, hesitancy or dysuria. MUSCULOSKELETAL: No joint or muscle pain, no back pain, no recent trauma. DERMATOLOGIC: No rash, no itching, no lesions. ENDOCRINE: No polyuria, polydipsia, no heat or cold intolerance. No recent change in weight. HEMATOLOGICAL: No anemia or easy bruising or bleeding. NEUROLOGIC: No headache, seizures, numbness, tingling or weakness. PSYCHIATRIC: No depression, no loss of interest in normal activity or change in sleep pattern.     Exam:   BP 114/70    Pulse 69    Resp  16    Ht 5' 4.25" (1.632 m)    Wt 167 lb (75.8 kg)    BMI 28.44 kg/m   Body mass index is 28.44 kg/m.  General appearance : Well developed well nourished female. No acute distress HEENT: Eyes: no retinal hemorrhage or exudates,  Neck supple, trachea midline, no carotid bruits, no thyroidmegaly Lungs: Clear to auscultation, no rhonchi or wheezes, or rib retractions  Heart: Regular rate and rhythm, no murmurs or gallops Breast:Examined in sitting and supine position were symmetrical in appearance, no palpable masses or tenderness,  no skin retraction, no nipple inversion, no nipple discharge, no skin discoloration, no axillary or supraclavicular lymphadenopathy Abdomen: no palpable masses or tenderness, no rebound or guarding Extremities: no edema or skin discoloration or tenderness  Pelvic: Vulva: Normal             Vagina: No gross lesions or discharge  Cervix/Uterus absent  Adnexa  Without masses or tenderness  Anus: Normal   Assessment/Plan:  66 y.o. female for annual exam   1. 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  Postmenopause, well on no HRT.  No pelvic pain.  Abstinent x more than 10 yrs.  Pap 04/2020 Neg.  Urine/BMs normal.  Breasts normal.  Mammo Neg 02/2021. BD Normal 07/2020. BMI 28.44. Physically active. Health labs with Fam MD.  Colono 05/2017.    2. S/P total hysterectomy  3. Postmenopause Postmenopause, well on no HRT.  No pelvic pain.  Abstinent x more than 10 yrs.  Bone Density normal in 07/2020.  Repeat at 5 years.  Other orders - Omega-3 Fatty Acids (FISH OIL PO); Take by mouth.   Genia Del MD, 3:05 PM 05/12/2021

## 2021-05-15 ENCOUNTER — Ambulatory Visit: Payer: BC Managed Care – PPO | Admitting: Nurse Practitioner

## 2021-05-15 ENCOUNTER — Other Ambulatory Visit: Payer: Self-pay

## 2021-05-15 ENCOUNTER — Encounter: Payer: Self-pay | Admitting: Nurse Practitioner

## 2021-05-15 VITALS — BP 118/76 | HR 74 | Temp 98.1°F | Ht 64.25 in | Wt 167.0 lb

## 2021-05-15 DIAGNOSIS — R7303 Prediabetes: Secondary | ICD-10-CM

## 2021-05-15 DIAGNOSIS — E079 Disorder of thyroid, unspecified: Secondary | ICD-10-CM

## 2021-05-15 DIAGNOSIS — E7800 Pure hypercholesterolemia, unspecified: Secondary | ICD-10-CM | POA: Diagnosis not present

## 2021-05-15 NOTE — Progress Notes (Signed)
Subjective:  Patient ID: Brittany [name], female    DOB: Dec 07, 1955  Age: 66 y.o. MRN: [id number]  CC:  Chief Complaint  Patient presents with   New Patient (Initial Visit)      HPI  This patient arrives today for the above.  She is establishing care at this practice.  Her main concern is having A1c checked as she was told she was prediabetic in the past.  Otherwise she tells me she is feeling well.  She does report a history of abnormal thyroid function test but tells me she is no longer taking medication.  She would like this checked as well today.  Past Medical History:  Diagnosis Date   Anxiety and depression    Elevated LDL cholesterol level    GERD (gastroesophageal reflux disease)    Thyroid disorder 11/14/2017      Family History  Problem Relation Age of Onset   Breast cancer Mother 55   Diabetes Mother    Hypertension Mother    Breast cancer Paternal Grandmother    Cancer Father        liver?   Diabetes Brother     Social History   Social History Narrative   Not on file   Social History   Tobacco Use   Smoking status: Former   Smokeless tobacco: Never   Tobacco comments:    stopped at age 24. smoked for 4-5 years 1 ppd  Substance Use Topics   Alcohol use: Yes    Comment: WINE OCC     Current Meds  Medication Sig   ALPHA LIPOIC ACID PO Take 600 mg by mouth daily.   Ascorbic Acid (VITAMIN C PO) Take by mouth.   Biotin 10 MG TABS Take 1 tablet by mouth daily.   cycloSPORINE (RESTASIS) 0.05 % ophthalmic emulsion Place 1 drop into both eyes 2 (two) times daily.   EVENING PRIMROSE OIL PO Take by mouth.   FLUoxetine (PROZAC) 20 MG tablet TAKE ONE TABLET BY MOUTH ONE TIME DAILY   Methylsulfonylmethane (MSM) 1000 MG TABS Take 1 tablet by mouth. Takes three times a week   Milk Thistle 175 MG CAPS Take 1 capsule by mouth daily as needed.   Omega-3 Fatty Acids (FISH OIL PO) Take 1 capsule by mouth daily.   OVER THE COUNTER MEDICATION Holy basil  -   5 times a week   Probiotic Product (PROBIOTIC-10 PO) Take 1 tablet by mouth. Takes 3 times per week   vitamin B-12 (CYANOCOBALAMIN) 500 MCG tablet Take 500 mcg by mouth daily.   VITAMIN D PO Take by mouth.   [DISCONTINUED] b complex vitamins tablet Take 1 tablet by mouth daily. b12   [DISCONTINUED] ibuprofen (ADVIL) 800 MG tablet TAKE 1 TABLET BY MOUTH EVERY 8 HOURS AS NEEDED    ROS:  Review of Systems  Respiratory:  Negative for cough and shortness of breath.   Cardiovascular:  Negative for chest pain.  Gastrointestinal:  Positive for heartburn (intermittent).  Neurological:  Positive for headaches (intermittently). Negative for loss of consciousness.    Objective:   Today's Vitals: BP 118/76 (BP Location: Left Arm, Patient Position: Sitting, Cuff Size: Normal)    Pulse 74    Temp 98.1 F (36.7 C) (Oral)    Ht 5' 4.25" (1.632 m)    Wt 167 lb (75.8 kg)    SpO2 96%    BMI 28.44 kg/m  Vitals with BMI 05/15/2021 05/12/2021 01/29/2021  Height 5' 4.25" 5'  4.25" -  Weight 167 lbs 167 lbs 165 lbs 3 oz  BMI 28.44 28.44 -  Systolic 118 114 338  Diastolic 76 70 78  Pulse 74 69 72       Physical Exam Vitals reviewed.  Constitutional:      General: She is not in acute distress.    Appearance: Normal appearance.  HENT:     Head: Normocephalic and atraumatic.  Neck:     Vascular: No carotid bruit.  Cardiovascular:     Rate and Rhythm: Normal rate and regular rhythm.     Pulses: Normal pulses.     Heart sounds: Normal heart sounds.  Pulmonary:     Effort: Pulmonary effort is normal.     Breath sounds: Normal breath sounds.  Skin:    General: Skin is warm and dry.  Neurological:     General: No focal deficit present.     Mental Status: She is alert and oriented to person, place, and time.  Psychiatric:        Mood and Affect: Mood normal.        Behavior: Behavior normal.        Judgment: Judgment normal.         Assessment and Plan   1. Thyroid disorder   2.  Elevated LDL cholesterol level   3. Prediabetes      Plan: 1.-3.  We will check blood work today for further evaluation.  Patient will follow-up in approximately 1 month for annual physical exam at which point we will discuss her blood work results.   Tests ordered Orders Placed This Encounter  Procedures   TSH   Hemoglobin A1c   Lipid panel   Comprehensive metabolic panel   CBC with Differential/Platelet   T3, free   T4, free      No orders of the defined types were placed in this encounter.   Patient to follow-up in 1 to 3 months for annual physical exam, or sooner as needed.  [name], NP

## 2021-05-19 ENCOUNTER — Other Ambulatory Visit: Payer: Self-pay | Admitting: Internal Medicine

## 2021-05-26 ENCOUNTER — Other Ambulatory Visit (INDEPENDENT_AMBULATORY_CARE_PROVIDER_SITE_OTHER): Payer: BC Managed Care – PPO

## 2021-05-26 ENCOUNTER — Other Ambulatory Visit: Payer: Self-pay

## 2021-05-26 DIAGNOSIS — R7303 Prediabetes: Secondary | ICD-10-CM

## 2021-05-26 DIAGNOSIS — E7800 Pure hypercholesterolemia, unspecified: Secondary | ICD-10-CM | POA: Diagnosis not present

## 2021-05-26 DIAGNOSIS — E079 Disorder of thyroid, unspecified: Secondary | ICD-10-CM | POA: Diagnosis not present

## 2021-05-26 LAB — CBC WITH DIFFERENTIAL/PLATELET
Basophils Absolute: 0 10*3/uL (ref 0.0–0.1)
Basophils Relative: 0.5 % (ref 0.0–3.0)
Eosinophils Absolute: 0.1 10*3/uL (ref 0.0–0.7)
Eosinophils Relative: 1.7 % (ref 0.0–5.0)
HCT: 40.9 % (ref 36.0–46.0)
Hemoglobin: 13.2 g/dL (ref 12.0–15.0)
Lymphocytes Relative: 45.5 % (ref 12.0–46.0)
Lymphs Abs: 3.3 10*3/uL (ref 0.7–4.0)
MCHC: 32.4 g/dL (ref 30.0–36.0)
MCV: 95.9 fl (ref 78.0–100.0)
Monocytes Absolute: 0.7 10*3/uL (ref 0.1–1.0)
Monocytes Relative: 9.8 % (ref 3.0–12.0)
Neutro Abs: 3 10*3/uL (ref 1.4–7.7)
Neutrophils Relative %: 42.5 % — ABNORMAL LOW (ref 43.0–77.0)
Platelets: 315 10*3/uL (ref 150.0–400.0)
RBC: 4.26 Mil/uL (ref 3.87–5.11)
RDW: 13.6 % (ref 11.5–15.5)
WBC: 7.1 10*3/uL (ref 4.0–10.5)

## 2021-05-26 LAB — LIPID PANEL
Cholesterol: 254 mg/dL — ABNORMAL HIGH (ref 0–200)
HDL: 61.2 mg/dL (ref >39.00)
LDL Cholesterol: 161 mg/dL — ABNORMAL HIGH (ref 0–99)
NonHDL: 193.23
Total CHOL/HDL Ratio: 4
Triglycerides: 159 mg/dL — ABNORMAL HIGH (ref 0.0–149.0)
VLDL: 31.8 mg/dL (ref 0.0–40.0)

## 2021-05-26 LAB — COMPREHENSIVE METABOLIC PANEL
ALT: 68 U/L — ABNORMAL HIGH (ref 0–35)
AST: 37 U/L (ref 0–37)
Albumin: 4.2 g/dL (ref 3.5–5.2)
Alkaline Phosphatase: 87 U/L (ref 39–117)
BUN: 14 mg/dL (ref 6–23)
CO2: 30 mEq/L (ref 19–32)
Calcium: 9.7 mg/dL (ref 8.4–10.5)
Chloride: 104 mEq/L (ref 96–112)
Creatinine, Ser: 0.92 mg/dL (ref 0.40–1.20)
GFR: 66.12 mL/min (ref >60.00)
Glucose, Bld: 91 mg/dL (ref 70–99)
Potassium: 4.3 mEq/L (ref 3.5–5.1)
Sodium: 141 mEq/L (ref 135–145)
Total Bilirubin: 0.6 mg/dL (ref 0.2–1.2)
Total Protein: 7 g/dL (ref 6.0–8.3)

## 2021-05-26 LAB — TSH: TSH: 1.6 u[IU]/mL (ref 0.35–5.50)

## 2021-05-26 LAB — HEMOGLOBIN A1C: Hgb A1c MFr Bld: 6 % (ref 4.6–6.5)

## 2021-05-26 LAB — T3, FREE: T3, Free: 3.1 pg/mL (ref 2.3–4.2)

## 2021-05-26 LAB — T4, FREE: Free T4: 0.76 ng/dL (ref 0.60–1.60)

## 2021-07-23 ENCOUNTER — Ambulatory Visit: Payer: BC Managed Care – PPO | Admitting: Nurse Practitioner

## 2021-07-23 ENCOUNTER — Other Ambulatory Visit: Payer: Self-pay

## 2021-07-23 VITALS — BP 110/66 | HR 75 | Temp 98.2°F | Ht 64.25 in

## 2021-07-23 DIAGNOSIS — E785 Hyperlipidemia, unspecified: Secondary | ICD-10-CM

## 2021-07-23 DIAGNOSIS — Z0001 Encounter for general adult medical examination with abnormal findings: Secondary | ICD-10-CM | POA: Diagnosis not present

## 2021-07-23 DIAGNOSIS — R7303 Prediabetes: Secondary | ICD-10-CM

## 2021-07-23 DIAGNOSIS — R748 Abnormal levels of other serum enzymes: Secondary | ICD-10-CM

## 2021-07-23 DIAGNOSIS — E663 Overweight: Secondary | ICD-10-CM

## 2021-07-23 HISTORY — DX: Abnormal levels of other serum enzymes: R74.8

## 2021-07-23 LAB — COMPREHENSIVE METABOLIC PANEL
ALT: 19 U/L (ref 0–35)
AST: 20 U/L (ref 0–37)
Albumin: 4.5 g/dL (ref 3.5–5.2)
Alkaline Phosphatase: 83 U/L (ref 39–117)
BUN: 11 mg/dL (ref 6–23)
CO2: 26 mEq/L (ref 19–32)
Calcium: 9.4 mg/dL (ref 8.4–10.5)
Chloride: 104 mEq/L (ref 96–112)
Creatinine, Ser: 0.92 mg/dL (ref 0.40–1.20)
GFR: 66.05 mL/min (ref >60.00)
Glucose, Bld: 90 mg/dL (ref 70–99)
Potassium: 3.9 mEq/L (ref 3.5–5.1)
Sodium: 137 mEq/L (ref 135–145)
Total Bilirubin: 0.5 mg/dL (ref 0.2–1.2)
Total Protein: 7.2 g/dL (ref 6.0–8.3)

## 2021-07-23 NOTE — Assessment & Plan Note (Signed)
Chronic, she was encouraged to continue with lifestyle changes made regarding diet and exercise.  We will continue to monitor intermittently. ?

## 2021-07-23 NOTE — Progress Notes (Signed)
? ? ? ?Subjective:  ?Patient ID: Brittany [name], female    DOB: 1955-12-06  Age: 66 y.o. MRN: [id number] ? ?CC:  ?Chief Complaint  ?Patient presents with  ? Annual Exam  ?  No concerns  ?  ?  ? ? ?HPI  ?This patient arrives today for the above. ? ?Blood work was collected at last office visit which showed elevated ALT at 68, hyperlipidemia, and prediabetes.  Patient reports been prediabetic for a while.  She has made some lifestyle changes since that office visit and has reduced intake of processed foods and fast foods.  She also reports that she is trying to eat more low-cholesterol foods such as oatmeal. ? ?She would like to discuss weight loss options and has a goal of losing about 7 pounds. ? ?Per chart review, it appears she is up-to-date with mammogram, colonoscopy, hepatitis C screening, HIV screening, and no longer needs Pap smears. ? ?Past Medical History:  ?Diagnosis Date  ? Anxiety and depression   ? Elevated LDL cholesterol level   ? GERD (gastroesophageal reflux disease)   ? Thyroid disorder 11/14/2017  ? ? ? ? ?Family History  ?Problem Relation Age of Onset  ? Breast cancer Mother 52  ? Diabetes Mother   ? Hypertension Mother   ? Breast cancer Paternal Grandmother   ? Cancer Father   ?     liver?  ? Diabetes Brother   ? ? ?Social History  ? ?Social History Narrative  ? Not on file  ? ?Social History  ? ?Tobacco Use  ? Smoking status: Former  ? Smokeless tobacco: Never  ? Tobacco comments:  ?  stopped at age 17. smoked for 4-5 years 1 ppd  ?Substance Use Topics  ? Alcohol use: Yes  ?  Comment: WINE OCC  ? ? ? ?Current Meds  ?Medication Sig  ? ALPHA LIPOIC ACID PO Take 600 mg by mouth daily.  ? Ascorbic Acid (VITAMIN C PO) Take by mouth.  ? Biotin 10 MG TABS Take 1 tablet by mouth daily.  ? cycloSPORINE (RESTASIS) 0.05 % ophthalmic emulsion Place 1 drop into both eyes 2 (two) times daily.  ? EVENING PRIMROSE OIL PO Take by mouth.  ? FLUoxetine (PROZAC) 20 MG tablet TAKE ONE TABLET BY MOUTH ONE TIME  DAILY  ? Methylsulfonylmethane (MSM) 1000 MG TABS Take 1 tablet by mouth. Takes three times a week  ? Milk Thistle 175 MG CAPS Take 1 capsule by mouth daily as needed.  ? Omega-3 Fatty Acids (FISH OIL PO) Take 1 capsule by mouth daily.  ? OVER THE COUNTER MEDICATION Holy basil -   5 times a week  ? Probiotic Product (PROBIOTIC-10 PO) Take 1 tablet by mouth. Takes 3 times per week  ? vitamin B-12 (CYANOCOBALAMIN) 500 MCG tablet Take 500 mcg by mouth daily.  ? VITAMIN D PO Take by mouth.  ? ? ?ROS:  ?Review of Systems  ?Respiratory: Negative.    ?Cardiovascular: Negative.   ?Neurological: Negative.   ? ? ?Objective:  ? ?Today's Vitals: BP 110/66   Pulse 75   Temp 98.2 ?F (36.8 ?C) (Oral)   Ht 5' 4.25" (1.632 m)   SpO2 94%   BMI 28.44 kg/m?  ?Vitals with BMI 07/23/2021 05/15/2021 05/12/2021  ?Height 5' 4.25" 5' 4.25" 5' 4.25"  ?Weight - 167 lbs 167 lbs  ?BMI - 28.44 28.44  ?Systolic 559 741 638  ?Diastolic 66 76 70  ?Pulse 75 74 69  ?  ? ?  Physical Exam ?Vitals reviewed. Exam conducted with a chaperone present.  ?Constitutional:   ?   General: She is not in acute distress. ?   Appearance: Normal appearance.  ?HENT:  ?   Head: Normocephalic and atraumatic.  ?   Right Ear: Tympanic membrane, ear canal and external ear normal.  ?   Left Ear: Tympanic membrane, ear canal and external ear normal.  ?Eyes:  ?   General:     ?   Right eye: No discharge.     ?   Left eye: No discharge.  ?   Extraocular Movements: Extraocular movements intact.  ?   Conjunctiva/sclera: Conjunctivae normal.  ?   Pupils: Pupils are equal, round, and reactive to light.  ?Neck:  ?   Vascular: No carotid bruit.  ?Cardiovascular:  ?   Rate and Rhythm: Normal rate and regular rhythm.  ?   Pulses: Normal pulses.  ?   Heart sounds: Normal heart sounds. No murmur heard. ?Pulmonary:  ?   Effort: Pulmonary effort is normal.  ?   Breath sounds: Normal breath sounds.  ?Chest:  ?Breasts: ?   Breasts are symmetrical.  ?   Right: Inverted nipple (chronic finding)  present.  ?   Left: Inverted nipple (chronic finding) present.  ?Abdominal:  ?   General: Abdomen is flat. Bowel sounds are normal. There is no distension.  ?   Palpations: Abdomen is soft. There is no mass.  ?   Tenderness: There is no abdominal tenderness.  ?Musculoskeletal:     ?   General: No tenderness.  ?   Cervical back: Neck supple. No muscular tenderness.  ?   Right lower leg: No edema.  ?   Left lower leg: No edema.  ?Lymphadenopathy:  ?   Cervical: No cervical adenopathy.  ?   Upper Body:  ?   Right upper body: No supraclavicular adenopathy.  ?   Left upper body: No supraclavicular adenopathy.  ?Skin: ?   General: Skin is warm and dry.  ?Neurological:  ?   General: No focal deficit present.  ?   Mental Status: She is alert and oriented to person, place, and time.  ?   Motor: No weakness.  ?   Gait: Gait normal.  ?Psychiatric:     ?   Mood and Affect: Mood normal.     ?   Behavior: Behavior normal.     ?   Judgment: Judgment normal.  ? ? ? ? ? ? ? ?Assessment and Plan  ? ?1. Encounter for general adult medical examination with abnormal findings   ?2. Elevated liver enzymes   ?3. Overweight (BMI 25.0-29.9)   ?4. Hyperlipidemia, unspecified hyperlipidemia type   ?5. Prediabetes   ? ? ? ?Plan: ?See plan via problem list below.  ? ? ? ?Tests ordered ?Orders Placed This Encounter  ?Procedures  ? Comp Met (CMET)  ? ? ? ? ?No orders of the defined types were placed in this encounter. ? ? ?Patient to follow-up in 3 months to see if she is been successful in weight loss as well as to consider rechecking fasting lipid panel.  In addition to doing an annual physical exam today also performed office visit to address her concerns. ? ?Ailene Ards, NP ? ?

## 2021-07-23 NOTE — Assessment & Plan Note (Signed)
We will recheck metabolic panel today, if elevation is still present may consider liver ultrasound.  Further recommendations may be made based upon these results. ?

## 2021-07-23 NOTE — Assessment & Plan Note (Signed)
I encouraged her to continue with the changes she is made regarding her lifestyle specifically regarding her diet and physical activity.  She plans on doing so.  May consider rechecking in a few months to see if lipid panel improves. ?

## 2021-07-23 NOTE — Assessment & Plan Note (Signed)
Appears to be up-to-date with routine screenings recommended for female of her age.  She will be due for annual exam again in 1 year. ?

## 2021-07-23 NOTE — Assessment & Plan Note (Signed)
She was encouraged to continue with lifestyle changes she is made regarding her diet and exercise routine.  We may consider medication in the future to further assist her with weight loss if needed.  She will follow-up in about 3 months to see if she has been able to lose weight. ?

## 2021-10-23 ENCOUNTER — Ambulatory Visit: Payer: BC Managed Care – PPO | Admitting: Nurse Practitioner

## 2021-10-23 ENCOUNTER — Encounter: Payer: Self-pay | Admitting: Nurse Practitioner

## 2021-10-23 VITALS — BP 118/76 | HR 72 | Temp 98.5°F | Ht 64.25 in | Wt 170.0 lb

## 2021-10-23 DIAGNOSIS — R7303 Prediabetes: Secondary | ICD-10-CM | POA: Diagnosis not present

## 2021-10-23 DIAGNOSIS — E663 Overweight: Secondary | ICD-10-CM

## 2021-10-23 DIAGNOSIS — E785 Hyperlipidemia, unspecified: Secondary | ICD-10-CM

## 2021-10-23 MED ORDER — WEGOVY 0.25 MG/0.5ML ~~LOC~~ SOAJ: 0.2500 mg | SUBCUTANEOUS | 1 refills | Status: DC

## 2021-10-23 NOTE — Assessment & Plan Note (Signed)
We will add semaglutide injection weekly to treatment plan.  Continue to focus on lifestyle changes aimed at increasing exercise and following a healthy diet.  Discussed side effects and black box warning, patient provided with handout about [facility] today as well.

## 2021-10-23 NOTE — Assessment & Plan Note (Signed)
Chronic, current ASCVD risk score less than 10%.  Shared decision making discussion completed today, patient would like to focus on lifestyle changes and avoid pharmacological therapy at this point.  She is encouraged to follow a healthy diet and participate in exercise on a regular basis.

## 2021-10-23 NOTE — Assessment & Plan Note (Addendum)
Chronic, patient states that lifestyle changes have been challenging and would like to try [facility] weight loss injections. Will order 0.25 mg/ 0.29ml to start. Educated on administration, side effects, risks and benefits, and proper disposal of sharps. Patient to follow-up in 5 weeks.

## 2021-10-23 NOTE — Progress Notes (Signed)
Established Patient Office Visit  Subjective   Patient ID: STEPHANIA MACFARLANE, female    DOB: [birth year]  Age: 66 y.o. MRN: [id number]  Chief Complaint  Patient presents with   Follow-up   Weight Loss    Prediabetes actually/overweight/hyperlipidemia: ASCVD risk approximately 4%.  Last A1c 6.0.  Patient's BMI 28.  Patient has been reporting desire to make lifestyle changes aimed at weight loss and improvement in her A1c.  Unfortunately, she has been unable to make significant changes.  She is trying to motivate herself to get up early in the morning and exercise (currently exercising/yoga 1 to 2 days a week, goal of exercising 3 days a week).  She reports that she eats most during the evening which she feels is related to stress from her job.  Patient denies personal/family history of thyroid cancer or pancreatitis.       Review of Systems  Respiratory:  Negative for shortness of breath.   Cardiovascular:  Negative for chest pain.  Neurological:  Negative for dizziness and headaches.      Objective:     BP 118/76 (BP Location: Left Arm, Patient Position: Sitting, Cuff Size: Normal)   Pulse 72   Temp 98.5 F (36.9 C) (Oral)   Ht 5' 4.25" (1.632 m)   Wt 170 lb (77.1 kg)   SpO2 94%   BMI 28.95 kg/m  BP Readings from Last 3 Encounters:  10/23/21 118/76  07/23/21 110/66  05/15/21 118/76   Wt Readings from Last 3 Encounters:  10/23/21 170 lb (77.1 kg)  05/15/21 167 lb (75.8 kg)  05/12/21 167 lb (75.8 kg)      Physical Exam Vitals reviewed.  Constitutional:      General: She is not in acute distress.    Appearance: Normal appearance.  HENT:     Head: Normocephalic and atraumatic.  Neck:     Vascular: No carotid bruit.  Cardiovascular:     Rate and Rhythm: Normal rate and regular rhythm.     Pulses: Normal pulses.     Heart sounds: Normal heart sounds.  Pulmonary:     Effort: Pulmonary effort is normal.     Breath sounds: Normal breath sounds.  Skin:     General: Skin is warm and dry.  Neurological:     General: No focal deficit present.     Mental Status: She is alert and oriented to person, place, and time.  Psychiatric:        Mood and Affect: Mood normal.        Behavior: Behavior normal.        Judgment: Judgment normal.      No results found for any visits on 10/23/21.    The 10-year ASCVD risk score (Arnett DK, et al., 2019) is: 4.7%    Assessment & Plan:   Problem List Items Addressed This Visit       Other   Overweight (BMI 25.0-29.9) - Primary   Relevant Medications   Semaglutide-Weight Management (WEGOVY) 0.25 MG/0.5ML SOAJ   Hyperlipidemia    Chronic, current ASCVD risk score less than 10%.  Shared decision making discussion completed today, patient would like to focus on lifestyle changes and avoid pharmacological therapy at this point.  She is encouraged to follow a healthy diet and participate in exercise on a regular basis.      Relevant Medications   Semaglutide-Weight Management (WEGOVY) 0.25 MG/0.5ML SOAJ   Prediabetes    Patient states that lifestyle changes have been  challenging and would like to try [facility] weight loss injections. Will order 0.25 mg/ 0.52ml to start. Educated on administration, side effects, risks and benefits, and proper disposal of sharps. Patient to follow-up in 5 weeks.      Relevant Medications   Semaglutide-Weight Management (WEGOVY) 0.25 MG/0.5ML SOAJ    Return in about 5 weeks (around 11/27/2021) for follow-up with Zo Loudon.    [name], NP

## 2021-10-24 ENCOUNTER — Encounter: Payer: Self-pay | Admitting: Nurse Practitioner

## 2021-11-01 ENCOUNTER — Other Ambulatory Visit: Payer: Self-pay | Admitting: Nurse Practitioner

## 2021-11-03 ENCOUNTER — Telehealth: Payer: Self-pay | Admitting: Nurse Practitioner

## 2021-11-03 ENCOUNTER — Telehealth: Payer: Self-pay | Admitting: *Deleted

## 2021-11-04 NOTE — Telephone Encounter (Signed)
Rec'd determination med was APPROVED. Effective 11/03/21 - 06/05/22. Faxing approval to pharmacy on file,,,/lmb

## 2021-11-11 ENCOUNTER — Telehealth: Payer: Self-pay

## 2021-11-11 NOTE — Telephone Encounter (Signed)
PA started   Key: [id number]   Message from plan:Please advise the dispensing pharmacy to contact the Pharmacy Help Line at [phone] for assistance.

## 2021-11-17 NOTE — Telephone Encounter (Signed)
Look at phone note 11/03/21 PA for wegovy was done and approved. You can let her know the status.Marland KitchenRaechel [name]

## 2021-12-03 ENCOUNTER — Telehealth: Payer: Self-pay | Admitting: Nurse Practitioner

## 2021-12-03 ENCOUNTER — Other Ambulatory Visit: Payer: Self-pay | Admitting: Nurse Practitioner

## 2021-12-03 DIAGNOSIS — E663 Overweight: Secondary | ICD-10-CM

## 2021-12-03 DIAGNOSIS — R7303 Prediabetes: Secondary | ICD-10-CM

## 2021-12-03 DIAGNOSIS — F419 Anxiety disorder, unspecified: Secondary | ICD-10-CM

## 2021-12-03 DIAGNOSIS — E785 Hyperlipidemia, unspecified: Secondary | ICD-10-CM

## 2021-12-03 MED ORDER — FLUOXETINE HCL 20 MG PO TABS: 20.0000 mg | ORAL_TABLET | Freq: Every day | ORAL | 1 refills | Status: DC

## 2021-12-03 MED ORDER — WEGOVY 0.25 MG/0.5ML ~~LOC~~ SOAJ: 0.2500 mg | SUBCUTANEOUS | 1 refills | Status: DC

## 2021-12-03 NOTE — Telephone Encounter (Signed)
Caller & Relationship to patient: Aalaiyah Yassin  Call back number: [phone]  Date of last office visit: 10/23/21  Date of next office visit: 12/25/21  Medication(s) to be refilled: FLUoxetine (PROZAC) 20 MG tablet  Semaglutide-Weight Management (WEGOVY) 0.25 MG/0.5ML [facility]  Preferred Pharmacy:  [facility] # [address], [place] - [address] Phone:  [phone]  Fax:  [phone]

## 2021-12-14 ENCOUNTER — Telehealth: Payer: Self-pay | Admitting: Nurse Practitioner

## 2021-12-14 NOTE — Telephone Encounter (Signed)
Pt called and stated Wegovy was sent to [facility] in [place]. When she called on 12/03/21 the pharmacy told her they were backed up getting orders ready. When she called today they advised her they are out of stock. Pt would like to know if there is another medication she can take until she is able to find a pharmacy that has wegovy? Pt she states she is due for her next dose on 12/15/21.   Please advise  CB: [phone]

## 2021-12-15 NOTE — Telephone Encounter (Signed)
Is there an alternative since [facility] is out of stock? Please advise.

## 2021-12-17 NOTE — Telephone Encounter (Signed)
Spoke with patient and let her know about about ozempic and possibly not being able to get it due to insurance. She agrees to wait until her upcoming visit to discuss it with you.

## 2021-12-25 ENCOUNTER — Encounter: Payer: Self-pay | Admitting: Nurse Practitioner

## 2021-12-25 ENCOUNTER — Telehealth (INDEPENDENT_AMBULATORY_CARE_PROVIDER_SITE_OTHER): Payer: BC Managed Care – PPO | Admitting: Nurse Practitioner

## 2021-12-25 ENCOUNTER — Ambulatory Visit: Payer: BC Managed Care – PPO | Admitting: Nurse Practitioner

## 2021-12-25 VITALS — Ht 65.0 in | Wt 169.0 lb

## 2021-12-25 DIAGNOSIS — R748 Abnormal levels of other serum enzymes: Secondary | ICD-10-CM

## 2021-12-25 DIAGNOSIS — R944 Abnormal results of kidney function studies: Secondary | ICD-10-CM

## 2021-12-25 DIAGNOSIS — E663 Overweight: Secondary | ICD-10-CM | POA: Diagnosis not present

## 2021-12-25 NOTE — Assessment & Plan Note (Signed)
Currently resolved, no further work-up recommended today.

## 2021-12-25 NOTE — Progress Notes (Signed)
   Established Patient Office Visit  An audio/visual tele-health visit was completed today for this patient. I connected with  Brittany [name] on 12/25/21 utilizing audio/visual technology and verified that I am speaking with the correct person using two identifiers. The patient was located at their home, and I was located at the office of [facility] during the encounter. I discussed the limitations of evaluation and management by telemedicine. The patient expressed understanding and agreed to proceed.     Subjective   Patient ID: Brittany [name], female    DOB: 05/28/1955  Age: 66 y.o. MRN: [id number]  Chief Complaint  Patient presents with   Overweight    Patient arrives for virtual visit for the above.  She had previously been prescribed [facility] for assistance with weight loss.  BMI is in the overweight range in addition she does have prediabetes as well as elevated cholesterol.  She tells me that before the [facility] shortage she was able to take 4 doses and tolerated it well.  She did have reduction in number of bowel movements but denies constipation.  She also denies any significant side effects but she did have mild headache and nausea.  She would like to go back on the [facility] once it comes back in stock for short period to help with weight loss.  Since stopping Wegovy she does feel that her food cravings have returned but she has been able to sustain lifestyle changes that she made while taking the medication.  She reports a weight loss of about 3 to 4 pounds.  Additionally, she did mention kidney health.  Last metabolic panel shows a GFR of approximately 66.  She has history of mildly elevated ALT at 68 however upon recheck a couple of months later and normalized back to 19.    ROS: See HPI    Objective:     Ht 5\' 5"  (1.651 m)   Wt 169 lb (76.7 kg)   BMI 28.12 kg/m     Physical Exam Comprehensive physical exam not completed today as office visit was  conducted remotely.  Patient appears well on video..  Patient was alert and oriented, and appeared to have appropriate judgment.   No results found for any visits on 12/25/21.     The 10-year ASCVD risk score (Arnett DK, et al., 2019) is: 7%    Assessment & Plan:   Problem List Items Addressed This Visit       Other   Overweight (BMI 25.0-29.9)    Patient will refill Wegovy once it is back in stock and will continue taking for short period time.  Patient encouraged to let me know if she has any concerns between now and next appointment.  She reports understanding.      Decreased GFR    GFR slightly below normal, however at acceptable level based on patient's age.  May consider checking albuminuria at next office visit.  For now patient encouraged to follow healthy lifestyle measures aimed at supporting kidney health such as drinking water throughout the day, avoiding excessive caffeine, avoiding nephrotoxic agents such as ibuprofen.  She reports her understanding.      RESOLVED: Elevated liver enzymes - Primary    Currently resolved, no further work-up recommended today.       Return for F/U with Okema Rollinson in 4 weeks after restarting wegovy.    03-12-2001, NP

## 2021-12-25 NOTE — Assessment & Plan Note (Signed)
Patient will refill Wegovy once it is back in stock and will continue taking for short period time.  Patient encouraged to let me know if she has any concerns between now and next appointment.  She reports understanding.

## 2021-12-25 NOTE — Assessment & Plan Note (Signed)
GFR slightly below normal, however at acceptable level based on patient's age.  May consider checking albuminuria at next office visit.  For now patient encouraged to follow healthy lifestyle measures aimed at supporting kidney health such as drinking water throughout the day, avoiding excessive caffeine, avoiding nephrotoxic agents such as ibuprofen.  She reports her understanding.

## 2022-01-18 ENCOUNTER — Other Ambulatory Visit: Payer: Self-pay | Admitting: Obstetrics & Gynecology

## 2022-01-18 DIAGNOSIS — Z1231 Encounter for screening mammogram for malignant neoplasm of breast: Secondary | ICD-10-CM

## 2022-02-16 ENCOUNTER — Ambulatory Visit: Payer: BC Managed Care – PPO | Admitting: Nurse Practitioner

## 2022-02-16 ENCOUNTER — Encounter: Payer: Self-pay | Admitting: Nurse Practitioner

## 2022-02-16 VITALS — BP 102/64 | HR 81

## 2022-02-16 DIAGNOSIS — R300 Dysuria: Secondary | ICD-10-CM | POA: Diagnosis not present

## 2022-02-16 DIAGNOSIS — N3001 Acute cystitis with hematuria: Secondary | ICD-10-CM

## 2022-02-16 MED ORDER — SULFAMETHOXAZOLE-TRIMETHOPRIM 800-160 MG PO TABS: 1.0000 | ORAL_TABLET | Freq: Two times a day (BID) | ORAL | 0 refills | Status: DC

## 2022-02-16 NOTE — Progress Notes (Signed)
   Acute Office Visit  Subjective:    Patient ID: Brittany [name], female    DOB: 07-11-1955, 66 y.o.   MRN: [id number]   HPI 66 y.o. presents today for urinary urgency, frequency, and dysuria x 2 weeks. Denies vaginal symptoms.    Review of Systems  Constitutional: Negative.   Genitourinary:  Positive for dysuria, frequency and urgency. Negative for difficulty urinating and hematuria.       Objective:    Physical Exam Constitutional:      Appearance: Normal appearance.   GU: not indicated  BP 102/64   Pulse 81   SpO2 98%  Wt Readings from Last 3 Encounters:  12/25/21 169 lb (76.7 kg)  10/23/21 170 lb (77.1 kg)  05/15/21 167 lb (75.8 kg)       UA: 2+ leukocytes, nitrite positive, 1+ protein, 2+ blood, dark yellow/cloudy. Microscopic: wbc 40-60, rbc 10-20, many bacteria  Assessment & Plan:   Problem List Items Addressed This Visit   None Visit Diagnoses     Acute cystitis with hematuria    -  Primary   Relevant Medications   sulfamethoxazole-trimethoprim (BACTRIM DS) 800-160 MG tablet   Dysuria       Relevant Orders   Urinalysis,Complete w/RFL Culture      Plan: Acute cystitis - Bactrim 800-160 mg BID x 3 days. Culture pending. Declines pyridium.      [place], 8:54 AM 02/16/2022

## 2022-02-19 ENCOUNTER — Ambulatory Visit
Admission: RE | Admit: 2022-02-19 | Discharge: 2022-02-19 | Disposition: A | Discharge status: Home / Self Care | Payer: BC Managed Care – PPO | Source: Ambulatory Visit | Attending: Obstetrics & Gynecology | Admitting: Obstetrics & Gynecology

## 2022-02-19 DIAGNOSIS — Z1231 Encounter for screening mammogram for malignant neoplasm of breast: Secondary | ICD-10-CM

## 2022-02-22 ENCOUNTER — Other Ambulatory Visit: Payer: Self-pay | Admitting: Nurse Practitioner

## 2022-02-22 DIAGNOSIS — N3001 Acute cystitis with hematuria: Secondary | ICD-10-CM

## 2022-02-22 MED ORDER — NITROFURANTOIN MONOHYD MACRO 100 MG PO CAPS: 100.0000 mg | ORAL_CAPSULE | Freq: Two times a day (BID) | ORAL | 0 refills | Status: DC

## 2022-03-04 LAB — URINALYSIS, COMPLETE W/RFL CULTURE
Bilirubin Urine: NEGATIVE
Glucose, UA: NEGATIVE
Hyaline Cast: NONE SEEN /LPF
Ketones, ur: NEGATIVE
Nitrites, Initial: POSITIVE — AB
Specific Gravity, Urine: 1.015 (ref 1.001–1.035)
pH: 6 (ref 5.0–8.0)

## 2022-03-04 LAB — URINE CULTURE
MICRO NUMBER:: 14030806
SPECIMEN QUALITY:: ADEQUATE

## 2022-03-04 LAB — CULTURE INDICATED

## 2022-03-08 ENCOUNTER — Encounter: Payer: Self-pay | Admitting: Nurse Practitioner

## 2022-03-08 ENCOUNTER — Other Ambulatory Visit: Payer: Self-pay | Admitting: Nurse Practitioner

## 2022-03-08 ENCOUNTER — Ambulatory Visit: Payer: BC Managed Care – PPO | Admitting: Nurse Practitioner

## 2022-03-08 VITALS — BP 110/78 | HR 74

## 2022-03-08 DIAGNOSIS — R109 Unspecified abdominal pain: Secondary | ICD-10-CM

## 2022-03-08 DIAGNOSIS — N3001 Acute cystitis with hematuria: Secondary | ICD-10-CM

## 2022-03-08 DIAGNOSIS — N3000 Acute cystitis without hematuria: Secondary | ICD-10-CM

## 2022-03-08 DIAGNOSIS — R350 Frequency of micturition: Secondary | ICD-10-CM | POA: Diagnosis not present

## 2022-03-08 MED ORDER — AMOXICILLIN-POT CLAVULANATE 500-125 MG PO TABS: 1.0000 | ORAL_TABLET | Freq: Two times a day (BID) | ORAL | 0 refills | Status: AC

## 2022-03-08 NOTE — Progress Notes (Signed)
   Acute Office Visit  Subjective:    Patient ID: Brittany [name], female    DOB: Jul 16, 1955, 66 y.o.   MRN: [id number]   HPI 66 y.o. presents today for urinary frequency, flank discomfort, and feeling like she is not fully emptying bladder. Treated for UTI 02/22/22 with Macrobid. Finished full course, symptoms improved but did not fully go away.    Review of Systems  Constitutional: Negative.   Genitourinary:  Positive for flank pain and frequency. Negative for dysuria, hematuria and urgency.       Objective:    Physical Exam Constitutional:      Appearance: Normal appearance.  Abdominal:     Tenderness: There is no right CVA tenderness or left CVA tenderness.     BP 110/78   Pulse 74   SpO2 94%  Wt Readings from Last 3 Encounters:  12/25/21 169 lb (76.7 kg)  10/23/21 170 lb (77.1 kg)  05/15/21 167 lb (75.8 kg)        UA: Leukocytosis 1+, negative nitrites, trace blood, yellow/cloudy. Microscopic: wbc 20-40, rbc 0-2, moderate bacteria  Assessment & Plan:   Problem List Items Addressed This Visit   None Visit Diagnoses     Acute cystitis without hematuria    -  Primary   Relevant Medications   amoxicillin-clavulanate (AUGMENTIN) 500-125 MG tablet   Urinary frequency       Relevant Orders   Urinalysis,Complete w/RFL Culture (Completed)   Flank pain       Relevant Orders   Urinalysis,Complete w/RFL Culture (Completed)      Plan: Acute cystitis - Augmentin 500-125 mg BID x 7 days. Culture pending. Increase water intake. Declines pyridium.      Tamela Gammon DNP, 3:28 PM 03/08/2022

## 2022-03-08 NOTE — Telephone Encounter (Signed)
Please schedule visit with TW for UTI.

## 2022-03-09 NOTE — Telephone Encounter (Signed)
Pt had OV on 03/08/2022 w/ UA and Ucx sent.  TW provided augmentin at time of visit.   Still awaiting results on culture. Will refuse this med request.

## 2022-03-10 ENCOUNTER — Other Ambulatory Visit: Payer: Self-pay | Admitting: *Deleted

## 2022-03-10 LAB — URINALYSIS, COMPLETE W/RFL CULTURE
Bilirubin Urine: NEGATIVE
Glucose, UA: NEGATIVE
Hyaline Cast: NONE SEEN /LPF
Ketones, ur: NEGATIVE
Nitrites, Initial: NEGATIVE
Protein, ur: NEGATIVE
Specific Gravity, Urine: 1.003 (ref 1.001–1.035)
pH: 6 (ref 5.0–8.0)

## 2022-03-10 LAB — URINE CULTURE
MICRO NUMBER:: 14117876
SPECIMEN QUALITY:: ADEQUATE

## 2022-03-10 LAB — CULTURE INDICATED

## 2022-03-10 MED ORDER — FLUCONAZOLE 150 MG PO TABS: ORAL_TABLET | ORAL | 0 refills | Status: DC

## 2022-05-13 ENCOUNTER — Ambulatory Visit (INDEPENDENT_AMBULATORY_CARE_PROVIDER_SITE_OTHER): Payer: BC Managed Care – PPO | Admitting: Obstetrics & Gynecology

## 2022-05-13 ENCOUNTER — Encounter: Payer: Self-pay | Admitting: Obstetrics & Gynecology

## 2022-05-13 VITALS — BP 114/74 | HR 66 | Ht 64.25 in | Wt 167.0 lb

## 2022-05-13 DIAGNOSIS — Z01419 Encounter for gynecological examination (general) (routine) without abnormal findings: Secondary | ICD-10-CM

## 2022-05-13 DIAGNOSIS — Z780 Asymptomatic menopausal state: Secondary | ICD-10-CM

## 2022-05-13 DIAGNOSIS — Z90710 Acquired absence of both cervix and uterus: Secondary | ICD-10-CM | POA: Diagnosis not present

## 2022-05-13 NOTE — Progress Notes (Signed)
Brittany [name] 06-18-1955 [id number]   History:    67 y.o. G1P1L1 Single.  Planning to retire this year. Daughter 3 you in [place], [place].  Patient's family in [place].   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  Postmenopause, well on no HRT.  No pelvic pain.  Abstinent x more than 10 yrs.  Pap 04/2020 Neg.  Urine/BMs normal.  Breasts normal, except occasional left breast aching.   Mammo Neg 02/2022. BD Normal 07/2020. BMI 28.44. Physically active. Health labs with Fam MD.  Harriet Masson 05/2017. Flu vaccine done at pharmacy.   Past medical history,surgical history, family history and social history were all reviewed and documented in the EPIC chart.  Gynecologic History No LMP recorded. Patient has had a hysterectomy.  Obstetric History OB History  Gravida Para Term Preterm AB Living  1 1 1     1   SAB IAB Ectopic Multiple Live Births               # Outcome Date GA Lbr Len/2nd Weight Sex Delivery Anes PTL Lv  1 Term              ROS: A ROS was performed and pertinent positives and negatives are included in the history. GENERAL: No fevers or chills. HEENT: No change in vision, no earache, sore throat or sinus congestion. NECK: No pain or stiffness. CARDIOVASCULAR: No chest pain or pressure. No palpitations. PULMONARY: No shortness of breath, cough or wheeze. GASTROINTESTINAL: No abdominal pain, nausea, vomiting or diarrhea, melena or bright red blood per rectum. GENITOURINARY: No urinary frequency, urgency, hesitancy or dysuria. MUSCULOSKELETAL: No joint or muscle pain, no back pain, no recent trauma. DERMATOLOGIC: No rash, no itching, no lesions. ENDOCRINE: No polyuria, polydipsia, no heat or cold intolerance. No recent change in weight. HEMATOLOGICAL: No anemia or easy bruising or bleeding. NEUROLOGIC: No headache, seizures, numbness, tingling or weakness. PSYCHIATRIC: No depression, no loss of interest  in normal activity or change in sleep pattern.     Exam:   BP 114/74   Pulse 66   Ht 5' 4.25" (1.632 m)   Wt 167 lb (75.8 kg)   SpO2 97%   BMI 28.44 kg/m   Body mass index is 28.44 kg/m.  General appearance : Well developed well nourished female. No acute distress HEENT: Eyes: no retinal hemorrhage or exudates,  Neck supple, trachea midline, no carotid bruits, no thyroidmegaly Lungs: Clear to auscultation, no rhonchi or wheezes, or rib retractions  Heart: Regular rate and rhythm, no murmurs or gallops Breast:Examined in sitting and supine position were symmetrical in appearance, no palpable masses or tenderness,  no skin retraction, no nipple inversion, no nipple discharge, no skin discoloration, no axillary or supraclavicular lymphadenopathy Abdomen: no palpable masses or tenderness, no rebound or guarding Extremities: no edema or skin discoloration or tenderness  Pelvic: Vulva: Normal             Vagina: No gross lesions or discharge  Cervix/Uterus absent  Adnexa  Without masses or tenderness  Anus: Normal   Assessment/Plan:  67 y.o. female for annual exam   1. 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  Postmenopause, well on no HRT.  No pelvic pain.  Abstinent x more than 10 yrs.  Pap 04/2020 Neg.  Urine/BMs normal.  Breasts normal, except occasional left breast aching.   Mammo Neg 02/2022.  BD Normal 07/2020. BMI 28.44. Physically active. Health labs with Fam MD.  Harriet Masson 05/2017. Flu vaccine done at pharmacy.   2. S/P total hysterectomy  3. 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  Postmenopause, well on no HRT.  No pelvic pain.  Abstinent x more than 10 yrs.  BD normal 07/2020.  Other orders - Flaxseed, Linseed, (FLAX SEED OIL PO); Take by mouth as needed. - MAGNESIUM CITRATE PO; Take by mouth. - ASHWAGANDHA PO; Take by mouth.   Princess Bruins MD, 1:37 PM

## 2022-05-28 ENCOUNTER — Other Ambulatory Visit: Payer: Self-pay | Admitting: Nurse Practitioner

## 2022-05-28 DIAGNOSIS — F32A Depression, unspecified: Secondary | ICD-10-CM

## 2022-10-15 ENCOUNTER — Encounter: Payer: Self-pay | Admitting: Internal Medicine

## 2022-10-15 ENCOUNTER — Ambulatory Visit (INDEPENDENT_AMBULATORY_CARE_PROVIDER_SITE_OTHER): Payer: BC Managed Care – PPO | Admitting: Internal Medicine

## 2022-10-15 ENCOUNTER — Other Ambulatory Visit: Payer: Self-pay

## 2022-10-15 VITALS — BP 110/74 | HR 83 | Temp 98.0°F | Resp 16 | Ht 64.0 in | Wt 170.3 lb

## 2022-10-15 DIAGNOSIS — L309 Dermatitis, unspecified: Secondary | ICD-10-CM

## 2022-10-15 DIAGNOSIS — J343 Hypertrophy of nasal turbinates: Secondary | ICD-10-CM | POA: Diagnosis not present

## 2022-10-15 DIAGNOSIS — J3081 Allergic rhinitis due to animal (cat) (dog) hair and dander: Secondary | ICD-10-CM

## 2022-10-15 DIAGNOSIS — K219 Gastro-esophageal reflux disease without esophagitis: Secondary | ICD-10-CM

## 2022-10-15 DIAGNOSIS — J3089 Other allergic rhinitis: Secondary | ICD-10-CM

## 2022-10-15 DIAGNOSIS — J301 Allergic rhinitis due to pollen: Secondary | ICD-10-CM | POA: Diagnosis not present

## 2022-10-15 MED ORDER — FLUTICASONE PROPIONATE 50 MCG/ACT NA SUSP: 2.0000 | Freq: Every day | NASAL | 5 refills | Status: DC

## 2022-10-15 MED ORDER — FAMOTIDINE 20 MG PO TABS: 20.0000 mg | ORAL_TABLET | Freq: Two times a day (BID) | ORAL | 5 refills | Status: DC | PRN

## 2022-10-15 MED ORDER — CETIRIZINE HCL 10 MG PO TABS: 10.0000 mg | ORAL_TABLET | Freq: Every day | ORAL | 5 refills | Status: DC | PRN

## 2022-10-15 MED ORDER — AZELASTINE HCL 0.1 % NA SOLN: 1.0000 | Freq: Two times a day (BID) | NASAL | 5 refills | Status: DC | PRN

## 2022-10-15 NOTE — Patient Instructions (Addendum)
Allergic Rhinitis: - Positive skin test 10/2022: trees, grasses, weeds, molds, cats, horses  - Avoidance measures discussed. - Use nasal saline rinses before nose sprays such as with Neilmed Sinus Rinse.  Use distilled water.   - Use Flonase 2 sprays each nostril daily. Aim upward and outward. - Use Azelastine 1-2 sprays each nostril twice daily as needed for runny nose, drainage, sneezing, congestion. Aim upward and outward. - Use Zyrtec 10 mg daily as needed for runny nose, sneezing, itchy watery eyes.  - Consider allergy shots as long term control of your symptoms by teaching your immune system to be more tolerant of your allergy triggers  GERD - Use Pepcid 20mg  twice daily as needed.  -Avoid lying down for at least two hours after a meal or after drinking acidic beverages, like soda, or other caffeinated beverages. This can help to prevent stomach contents from flowing back into the esophagus. -Keep your head elevated while you sleep. Using an extra pillow or two can also help to prevent reflux. -Eat smaller and more frequent meals each day instead of a few large meals. This promotes digestion and can aid in preventing heartburn. -Wear loose-fitting clothes to ease pressure on the stomach, which can worsen heartburn and reflux. -Reduce excess weight around the midsection. This can ease pressure on the stomach. Such pressure can force some stomach contents back up the esophagus.    Hand Dermatitis - Do a daily soaking tub bath in warm water for 10-15 minutes.  - Use a gentle, unscented cleanser at the end of the bath (such as Dove unscented bar or baby wash, or Aveeno sensitive body wash). Then rinse, pat half-way dry, and apply a gentle, unscented moisturizer cream or ointment (Cerave, Cetaphil, Eucerin, Aveeno)  all over while still damp. Dry skin makes the itching and rash worse. The skin should be moisturized with a gentle, unscented moisturizer at least twice daily.  - Use only unscented  liquid laundry detergent.   ALLERGEN AVOIDANCE MEASURES Molds - Indoor avoidance Use air conditioning to reduce indoor humidity.  Do not use a humidifier. Keep indoor humidity at 30 - 40%.  Use a dehumidifier if needed. In the bathroom use an exhaust fan or open a window after showering.  Wipe down damp surfaces after showering.  Clean bathrooms with a mold-killing solution (diluted bleach, or products like Tilex, etc) at least once a month. In the kitchen use an exhaust fan to remove steam from cooking.  Throw away spoiled foods immediately, and empty garbage daily.  Empty water pans below self-defrosting refrigerators frequently. Vent the clothes dryer to the outside. Limit indoor houseplants; mold grows in the dirt.  No houseplants in the bedroom. Remove carpet from the bedroom. Encase the mattress and box springs with a zippered encasing.  Molds - Outdoor avoidance Avoid being outside when the grass is being mowed, or the ground is tilled. Avoid playing in leaves, pine straw, hay, etc.  Dead plant materials contain mold. Avoid going into barns or grain storage areas. Remove leaves, clippings and compost from around the home. Pollen Avoidance Pollen levels are highest during the mid-day and afternoon.  Consider this when planning outdoor activities. Avoid being outside when the grass is being mowed, or wear a mask if the pollen-allergic person must be the one to mow the grass. Keep the windows closed to keep pollen outside of the home. Use an air conditioner to filter the air. Take a shower, wash hair, and change clothing after working or  playing outdoors during pollen season. Pet Dander Keep the pet out of your bedroom and restrict it to only a few rooms. Be advised that keeping the pet in only one room will not limit the allergens to that room. Don't pet, hug or kiss the pet; if you do, wash your hands with soap and water. High-efficiency particulate air (HEPA) cleaners run  continuously in a bedroom or living room can reduce allergen levels over time. Regular use of a high-efficiency vacuum cleaner or a central vacuum can reduce allergen levels. Giving your pet a bath at least once a week can reduce airborne allergen.

## 2022-10-15 NOTE — Progress Notes (Signed)
NEW PATIENT  Date of Service/Encounter:  10/15/22  Consult requested by: [name], NP   Subjective:   Brittany [name] (DOB: August 04, 1955) is a 67 y.o. female who presents to the clinic on 10/15/2022 with a chief complaint of Allergy Testing (Environmental: ALL/Food: No Hx) and Eczema .    History obtained from: chart review and patient.    Rhinitis:  Started around age 22-60s.   Symptoms include: nasal congestion, rhinorrhea, and post nasal drainage sinus pressure, headaches, throat clearing Does have intermittent dry cough, no history of asthma.  Denies any SOB/wheezing. Never required albuterol inhaler.   Occurs year-round Potential triggers: not sure   Treatments tried:  No nose sprays recently but previously tried Flonase; not sure why she stopped it Zyrtec PRN; last use was few weeks ago.    Previous allergy testing: no History of sinus surgery: no Nonallergic triggers: possibly eating/drinking especially with red wine    GERD: Does have trouble with heartburn/reflux, doing okay now.  Previously on Zantac and now on Gas X or Tagamet PRN.   Atopic Dermatitis:  Diagnosed since childhood.   Areas that flare commonly are hands. It is not severe enough to required topical steroids.  Current regimen: primrose, Nivea lotion/Neutrogena oil, [place] cream  Reports some use of fragrance/dye free products Identified triggers of flares include not sure  Sleep is not affected  Past Medical History: Past Medical History:  Diagnosis Date   Anxiety and depression    Elevated LDL cholesterol level    Elevated liver enzymes 07/23/2021   GERD (gastroesophageal reflux disease)    Thyroid disorder 11/14/2017    Past Surgical History: Past Surgical History:  Procedure Laterality Date   ABDOMINAL HYSTERECTOMY  03/2017   VAGINAL HYSTERECTOMY WTH OVARIAN PRESERVATION    HERNIA REPAIR     LAPAROSCOPIC HYSTERECTOMY      Family History: Family History  Problem  Relation Age of Onset   Breast cancer Mother 25   Diabetes Mother    Hypertension Mother    Breast cancer Paternal Grandmother    Cancer Father        liver?   Diabetes Brother     Social History:  Lives in a 25 year house Flooring in bedroom: carpet Pets: cat Tobacco use/exposure: 1978-1988, 1 pack per day Job: retired  Medication List:  Allergies as of 10/15/2022       Reactions   Wellbutrin [bupropion] Hives        Medication List        Accurate as of October 15, 2022 11:53 AM. If you have any questions, ask your nurse or doctor.          STOP taking these medications    FLUoxetine 20 MG tablet Commonly known as: PROZAC Stopped by: Birder Robson, MD       TAKE these medications    ALPHA LIPOIC ACID PO Take 600 mg by mouth daily.   ASHWAGANDHA PO Take by mouth.   azelastine 0.1 % nasal spray Commonly known as: ASTELIN Place 1 spray into both nostrils 2 (two) times daily as needed. Use in each nostril as directed Started by: Birder Robson, MD   Biotin 10 MG Tabs Take 1 tablet by mouth daily.   cetirizine 10 MG tablet Commonly known as: ZyrTEC Allergy Take 1 tablet (10 mg total) by mouth daily as needed for allergies. Started by: Birder Robson, MD   cycloSPORINE 0.05 % ophthalmic emulsion Commonly known as:  RESTASIS Place 1 drop into both eyes 2 (two) times daily.   EVENING PRIMROSE OIL PO Take by mouth.   famotidine 20 MG tablet Commonly known as: Pepcid Take 1 tablet (20 mg total) by mouth 2 (two) times daily as needed for heartburn. Started by: Birder Robson, MD   FISH OIL PO Take 1 capsule by mouth as needed.   FLAX SEED OIL PO Take by mouth as needed.   fluticasone 50 MCG/ACT nasal spray Commonly known as: FLONASE Place 2 sprays into both nostrils daily. Started by: Birder Robson, MD   MAGNESIUM CITRATE PO Take by mouth.   MSM 1000 MG Tabs Take 1 tablet by mouth. Takes three times a week   OVER THE COUNTER MEDICATION Holy  basil -   5 times a week   PROBIOTIC-10 PO Take 1 tablet by mouth. Takes 3 times per week   vitamin B-12 500 MCG tablet Commonly known as: CYANOCOBALAMIN Take 500 mcg by mouth daily.   VITAMIN D PO Take by mouth.         REVIEW OF SYSTEMS: Pertinent positives and negatives discussed in HPI.   Objective:   Physical Exam: BP 110/74 (BP Location: Right Arm, Patient Position: Sitting, Cuff Size: Normal)   Pulse 83   Temp 98 F (36.7 C) (Temporal)   Resp 16   Ht 5\' 4"  (1.626 m)   Wt 170 lb 4.8 oz (77.2 kg)   SpO2 99%   BMI 29.23 kg/m  Body mass index is 29.23 kg/m. GEN: alert, well developed HEENT: clear conjunctiva, TM grey and translucent, nose with + inferior turbinate hypertrophy, pink nasal mucosa, no rhinorrhea, + cobblestoning HEART: regular rate and rhythm, no murmur LUNGS: clear to auscultation bilaterally, no coughing, unlabored respiration ABDOMEN: soft, non distended  SKIN: no rashes or lesions, some dryness of hands   Reviewed:  08/18/2022: saw Dr. Marene Lenz ENT for cough and facial pressure.  Has hx of reflux on Zantac.  Also has nasal drainage with eating and drinking with frequent throat clearing.  Scope with mild L septal deviation, clear rhinorrhea, mild-moderate interarytenoid edema and erythema. CT sinus was completely clear, with normal aerated paranasal sinuses and patent sinus drainage pathways. There is no evidence of mucosal thickening or sinonasal disease.  05/13/2022: followed by Gyn for recurrent endometrial polyps s/p total hysterectomy. Doing well overall.   10/23/2021: saw PCP for preDM, HLD, obesity. Tried lifestyle measurements without improement. Discussed [id number].   Skin Testing:  Skin prick testing was placed, which includes aeroallergens/foods, histamine control, and saline control.  Verbal consent was obtained prior to placing test.  Patient tolerated procedure well.  Allergy testing results were read and interpreted by myself, documented  by clinical staff. Adequate positive and negative control.  Results discussed with patient/family.  Airborne Adult Perc - 10/15/22 1026     Time Antigen Placed 1026    Allergen Manufacturer Greer    Location Back    Number of Test 55    Panel 1 Select    2. Control-Histamine 3+    3. Bahia 3+    4. French Southern [place] 3+    5. Johnson 3+    6. Kentucky Blue 3+    7. Meadow Fescue Negative    8. Perennial Rye 2+    9. Timothy 3+    10. Ragweed Mix 3+    11. Cocklebur 2+    12. Plantain,  English Negative    13. Baccharis Negative    14. Dog  Fennel Negative    15. Russian Thistle 3+    16. Lamb's Quarters 3+    17. Sheep Sorrell 2+    18. Rough Pigweed 3+    19. Marsh Elder, Rough 2+    21. Box, Elder 3+    22. Cedar, red Negative    23. Sweet Gum 3+    24. Pecan Pollen 3+    25. Pine Mix Negative    27. Red Mulberry Negative    28. Ash Mix Negative    29. Birch Mix 3+    30. Beech American Negative    31. Cottonwood, Guinea-Bissau Negative    32. Hickory, White 3+    33. Maple Mix Negative    34. Oak, Guinea-[place]iss[place] Mix 3+    35. Sycamore Eastern 3+    36. Alternaria Alternata Negative    37. Cladosporium Herbarum Negative    38. Aspergillus Mix Negative    39. Penicillium Mix Negative    40. Bipolaris Sorokiniana (Helminthosporium) Negative    41. Drechslera Spicifera (Curvularia) Negative    42. Mucor Plumbeus Negative    43. Fusarium Moniliforme Negative    44. Aureobasidium Pullulans (pullulara) Negative    45. Rhizopus Oryzae Negative    46. Botrytis Cinera Negative    47. Epicoccum Nigrum Negative    48. Phoma Betae Negative    49. Dust Mite Mix Negative    50. Cat Hair 10,000 BAU/ml 3+    51.  Dog Epithelia Negative    52. Mixed Feathers Negative    53. Horse Epithelia 3+    54. Cockroach, German Negative    55. Tobacco Leaf Negative             Intradermal - 10/15/22 1107     Time Antigen Placed 1107    Allergen Manufacturer Waynette Buttery    Location Arm    Number of  Test 8    Intradermal Select    Control Negative    Mold 1 2+    Mold 2 3+    Mold 3 Negative    Mold 4 2+    Mite Mix Negative    Dog Negative    Cockroach Negative               Assessment:   1. Nasal turbinate hypertrophy   2. Gastroesophageal reflux disease, unspecified whether esophagitis present   3. Hand dermatitis   4. 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 Non-seasonal allergic rhinitis due to pollen   6. Allergic rhinitis caused by mold     Plan/Recommendations:  Allergic Rhinitis: - Due to turbinate hypertrophy and unresponsive to OTC meds, performed skin testing to identify aeroallergen triggers.   - Positive skin test 10/2022: trees, grasses, weeds, molds, cats, horses  - Avoidance measures discussed. - Use nasal saline rinses before nose sprays such as with Neilmed Sinus Rinse.  Use distilled water.   - Use Flonase 2 sprays each nostril daily. Aim upward and outward. - Use Azelastine 1-2 sprays each nostril twice daily as needed for runny nose, drainage, sneezing, congestion. Aim upward and outward. - Use Zyrtec 10 mg daily as needed for runny nose, sneezing, itchy watery eyes.  - Consider allergy shots as long term control of your symptoms by teaching your immune system to be more tolerant of your allergy triggers  GERD - Use Pepcid 20mg  twice daily as needed.  -Avoid lying down for at least two hours after a meal or  after drinking acidic beverages, like soda, or other caffeinated beverages. This can help to prevent stomach contents from flowing back into the esophagus. -Keep your head elevated while you sleep. Using an extra pillow or two can also help to prevent reflux. -Eat smaller and more frequent meals each day instead of a few large meals. This promotes digestion and can aid in preventing heartburn. -Wear loose-fitting clothes to ease pressure on the stomach, which can worsen heartburn and reflux. -Reduce excess weight around the  midsection. This can ease pressure on the stomach. Such pressure can force some stomach contents back up the esophagus.    Hand Dermatitis - Do a daily soaking tub bath in warm water for 10-15 minutes.  - Use a gentle, unscented cleanser at the end of the bath (such as Dove unscented bar or baby wash, or Aveeno sensitive body wash). Then rinse, pat half-way dry, and apply a gentle, unscented moisturizer cream or ointment (Cerave, Cetaphil, Eucerin, Aveeno)  all over while still damp. Dry skin makes the itching and rash worse. The skin should be moisturized with a gentle, unscented moisturizer at least twice daily.  - Use only unscented liquid laundry detergent.       Return in about 6 weeks (around 11/26/2022).  Alesia Morin, MD [facility]

## 2022-10-20 LAB — HM DIABETES EYE EXAM

## 2022-11-24 ENCOUNTER — Ambulatory Visit: Payer: BC Managed Care – PPO | Admitting: Internal Medicine

## 2022-12-23 ENCOUNTER — Encounter (INDEPENDENT_AMBULATORY_CARE_PROVIDER_SITE_OTHER): Payer: Self-pay

## 2023-01-04 ENCOUNTER — Encounter: Payer: BC Managed Care – PPO | Admitting: Internal Medicine

## 2023-01-21 ENCOUNTER — Other Ambulatory Visit: Payer: Self-pay | Admitting: Obstetrics & Gynecology

## 2023-01-21 DIAGNOSIS — Z1231 Encounter for screening mammogram for malignant neoplasm of breast: Secondary | ICD-10-CM

## 2023-01-28 ENCOUNTER — Ambulatory Visit (INDEPENDENT_AMBULATORY_CARE_PROVIDER_SITE_OTHER): Payer: Medicare Other | Admitting: Nurse Practitioner

## 2023-01-28 ENCOUNTER — Telehealth: Payer: Self-pay | Admitting: Nurse Practitioner

## 2023-01-28 VITALS — BP 114/86 | HR 77 | Temp 97.9°F | Ht 64.0 in | Wt 170.1 lb

## 2023-01-28 DIAGNOSIS — E785 Hyperlipidemia, unspecified: Secondary | ICD-10-CM | POA: Diagnosis not present

## 2023-01-28 DIAGNOSIS — M25561 Pain in right knee: Secondary | ICD-10-CM | POA: Diagnosis not present

## 2023-01-28 DIAGNOSIS — E559 Vitamin D deficiency, unspecified: Secondary | ICD-10-CM

## 2023-01-28 DIAGNOSIS — R7303 Prediabetes: Secondary | ICD-10-CM | POA: Diagnosis not present

## 2023-01-28 DIAGNOSIS — Z23 Encounter for immunization: Secondary | ICD-10-CM | POA: Diagnosis not present

## 2023-01-28 DIAGNOSIS — E663 Overweight: Secondary | ICD-10-CM

## 2023-01-28 LAB — LIPID PANEL
Cholesterol: 195 mg/dL (ref 0–200)
HDL: 62.4 mg/dL (ref >39.00)
LDL Cholesterol: 108 mg/dL — ABNORMAL HIGH (ref 0–99)
NonHDL: 132.24
Total CHOL/HDL Ratio: 3
Triglycerides: 119 mg/dL (ref 0.0–149.0)
VLDL: 23.8 mg/dL (ref 0.0–40.0)

## 2023-01-28 LAB — URIC ACID: Uric Acid, Serum: 4.8 mg/dL (ref 2.4–7.0)

## 2023-01-28 LAB — CBC
HCT: 42.8 % (ref 36.0–46.0)
Hemoglobin: 14 g/dL (ref 12.0–15.0)
MCHC: 32.7 g/dL (ref 30.0–36.0)
MCV: 95.6 fl (ref 78.0–100.0)
Platelets: 359 10*3/uL (ref 150.0–400.0)
RBC: 4.48 Mil/uL (ref 3.87–5.11)
RDW: 13.1 % (ref 11.5–15.5)
WBC: 7.1 10*3/uL (ref 4.0–10.5)

## 2023-01-28 LAB — COMPREHENSIVE METABOLIC PANEL
ALT: 25 U/L (ref 0–35)
AST: 22 U/L (ref 0–37)
Albumin: 4.4 g/dL (ref 3.5–5.2)
Alkaline Phosphatase: 77 U/L (ref 39–117)
BUN: 18 mg/dL (ref 6–23)
CO2: 25 mEq/L (ref 19–32)
Calcium: 9.5 mg/dL (ref 8.4–10.5)
Chloride: 104 mEq/L (ref 96–112)
Creatinine, Ser: 0.95 mg/dL (ref 0.40–1.20)
GFR: 62.88 mL/min (ref >60.00)
Glucose, Bld: 94 mg/dL (ref 70–99)
Potassium: 4.4 mEq/L (ref 3.5–5.1)
Sodium: 138 mEq/L (ref 135–145)
Total Bilirubin: 0.6 mg/dL (ref 0.2–1.2)
Total Protein: 7.1 g/dL (ref 6.0–8.3)

## 2023-01-28 LAB — VITAMIN D 25 HYDROXY (VIT D DEFICIENCY, FRACTURES): VITD: 42.45 ng/mL (ref 30.00–100.00)

## 2023-01-28 LAB — HEMOGLOBIN A1C: Hgb A1c MFr Bld: 5.7 % (ref 4.6–6.5)

## 2023-01-28 LAB — MICROALBUMIN / CREATININE URINE RATIO
Creatinine,U: 44.1 mg/dL
Microalb Creat Ratio: 1.6 mg/g (ref 0.0–30.0)
Microalb, Ur: <0.7 mg/dL (ref 0.0–1.9)

## 2023-01-28 LAB — TSH: TSH: 1.51 u[IU]/mL (ref 0.35–5.50)

## 2023-01-28 NOTE — Assessment & Plan Note (Signed)
Chronic Check lipid panel today, further recommendations may be made based upon these results

## 2023-01-28 NOTE — Assessment & Plan Note (Signed)
Chronic Labs ordered, further recommendations may be made based upon these results

## 2023-01-28 NOTE — Assessment & Plan Note (Signed)
Chronic, intermittent Check uric acid level to rule and rule out gout as potential etiology for intermittent pain

## 2023-01-28 NOTE — Assessment & Plan Note (Addendum)
Will administer Prevnar 20, VIS provided  Patient has elected to hold off on flu vaccine administration and will have this administered at her pharmacy.

## 2023-01-28 NOTE — Telephone Encounter (Signed)
Please start prior authorization for wegovy

## 2023-01-28 NOTE — Assessment & Plan Note (Signed)
Chronic Labs ordered, further recommendations may be made based upon these results.

## 2023-01-28 NOTE — Assessment & Plan Note (Signed)
Chronic Continue focusing on lifestyle modifications such as calorie deficit diet and increasing physical activity levels aimed at weight loss. Will request prior authorization to restart [facility], prescription will be sent pending prior authorization response

## 2023-01-28 NOTE — Progress Notes (Signed)
Established Patient Office Visit  Subjective   Patient ID: Brittany [name], female    DOB: Sep 13, 1955  Age: 67 y.o. MRN: [id number]  Chief Complaint  Patient presents with   Knee Pain    Patient arrives today for follow-up.  She was last seen about 18 months ago.  She would like to discuss her right knee pain as well as weight loss.  Significant past medical history includes hyperlipidemia, prediabetes, anxiety and depression, decreased GFR in the 60s.  Right knee pain: Has been present for at least 3 years.  Pain is intermittent.  Reports its flared up within the last week.  Reports she had workup completed a few years ago which did not identify any serious etiology.  Per chart review I do see an x-ray collected in 2021 which identified spurring in her knee, otherwise within normal limits.  She treats with as needed over-the-counter medication currently.  She is concerned she may have gout.  Overweight: Current BMI 29.20.  Did take [facility] for about 1 month but then was unable to get this approved by insurance for chronic use.  She has been increasing her activity level by walking around 1.5 miles twice a week and doing yoga at least once a week.  She is also reduced her alcohol intake as part of her goal to reduce overall calorie intake.  She does have comorbid prediabetes and hyperlipidemia.    ROS: see HPI    Objective:     BP 114/86   Pulse 77   Temp 97.9 F (36.6 C) (Temporal)   Ht 5\' 4"  (1.626 m)   Wt 170 lb 2 oz (77.2 kg)   SpO2 93%   BMI 29.20 kg/m  BP Readings from Last 3 Encounters:  01/28/23 114/86  10/15/22 110/74  05/13/22 114/74   Wt Readings from Last 3 Encounters:  01/28/23 170 lb 2 oz (77.2 kg)  10/15/22 170 lb 4.8 oz (77.2 kg)  05/13/22 167 lb (75.8 kg)        01/28/2023    9:58 AM 10/23/2021    8:02 AM 07/23/2021    3:53 PM  PHQ9 SCORE ONLY  PHQ-9 Total Score 7 10 1      Physical Exam Vitals reviewed.  Constitutional:      General: She is  not in acute distress.    Appearance: Normal appearance.  HENT:     Head: Normocephalic and atraumatic.  Neck:     Vascular: No carotid bruit.  Cardiovascular:     Rate and Rhythm: Normal rate and regular rhythm.     Pulses: Normal pulses.     Heart sounds: Normal heart sounds.  Pulmonary:     Effort: Pulmonary effort is normal.     Breath sounds: Normal breath sounds.  Musculoskeletal:     Right knee: Crepitus present.  Skin:    General: Skin is warm and dry.  Neurological:     General: No focal deficit present.     Mental Status: She is alert and oriented to person, place, and time.     Sensory: Sensation is intact.     Motor: Motor function is intact.  Psychiatric:        Mood and Affect: Mood normal.        Behavior: Behavior normal.        Judgment: Judgment normal.      No results found for any visits on 01/28/23.    The 10-year ASCVD risk score (Arnett DK, et al.,  2019) is: 4.8%    Assessment & Plan:   Problem List Items Addressed This Visit       Other   Vitamin D deficiency    Chronic Labs ordered, further recommendations may be made based upon these results       Relevant Orders   VITAMIN D 25 Hydroxy (Vit-D Deficiency, Fractures)   Overweight (BMI 25.0-29.9)    Chronic Continue focusing on lifestyle modifications such as calorie deficit diet and increasing physical activity levels aimed at weight loss. Will request prior authorization to restart [facility], prescription will be sent pending prior authorization response      Hyperlipidemia - Primary    Chronic Check lipid panel today, further recommendations may be made based upon these results      Relevant Orders   CBC   Comprehensive metabolic panel   Hemoglobin A1c   Lipid panel   TSH   Uric acid   Microalbumin / creatinine urine ratio   Prediabetes    Chronic Labs ordered, further recommendations may be made based upon these results       Relevant Orders   CBC   Comprehensive  metabolic panel   Hemoglobin A1c   Lipid panel   TSH   Uric acid   Microalbumin / creatinine urine ratio   Arthralgia of right knee    Chronic, intermittent Check uric acid level to rule and rule out gout as potential etiology for intermittent pain      Relevant Orders   CBC   Comprehensive metabolic panel   Hemoglobin A1c   Lipid panel   TSH   Uric acid   Microalbumin / creatinine urine ratio   Pneumococcal vaccination administered at current visit    Will administer Prevnar 20, VIS provided  Patient has elected to hold off on flu vaccine administration and will have this administered at her pharmacy.       Return in about 6 months (around 07/28/2023) for F/U with Maralyn Sago.    [name], NP

## 2023-01-28 NOTE — Addendum Note (Signed)
Addended by: Cathleen Fears, Makell Drohan P on: 01/28/2023 03:15 PM   Modules accepted: Orders

## 2023-02-01 ENCOUNTER — Telehealth: Payer: Self-pay

## 2023-02-01 ENCOUNTER — Other Ambulatory Visit (HOSPITAL_COMMUNITY): Payer: Self-pay

## 2023-02-01 NOTE — Telephone Encounter (Signed)
Pharmacy Patient Advocate Encounter   Received notification from Pt Calls Messages that prior authorization for Wegovy 0.25mg /0.31ml is required/requested.   Insurance verification completed.   The patient is insured through [facility] .   Per test claim: PA required; PA submitted to [facility] via CoverMyMeds Key/confirmation #/[id number] [facility] Status is pending

## 2023-02-02 NOTE — Telephone Encounter (Signed)
My chart message send to pt in regards wegovy denial

## 2023-02-02 NOTE — Telephone Encounter (Signed)
Pharmacy Patient Advocate Encounter  Received notification from [facility] that Prior Authorization for [facility] 0.25mg /0.45ml has been DENIED.  Full denial letter will be uploaded to the media tab. See denial reason below.

## 2023-02-16 ENCOUNTER — Other Ambulatory Visit: Payer: Self-pay | Admitting: Travel Medicine

## 2023-02-16 DIAGNOSIS — Z1231 Encounter for screening mammogram for malignant neoplasm of breast: Secondary | ICD-10-CM

## 2023-02-22 ENCOUNTER — Ambulatory Visit
Admission: RE | Admit: 2023-02-22 | Discharge: 2023-02-22 | Disposition: A | Discharge status: Home / Self Care | Payer: Medicare Other | Source: Ambulatory Visit | Attending: Surgery | Admitting: Surgery

## 2023-02-22 DIAGNOSIS — Z1231 Encounter for screening mammogram for malignant neoplasm of breast: Secondary | ICD-10-CM

## 2023-02-25 ENCOUNTER — Other Ambulatory Visit: Payer: Self-pay | Admitting: Nurse Practitioner

## 2023-02-25 DIAGNOSIS — R928 Other abnormal and inconclusive findings on diagnostic imaging of breast: Secondary | ICD-10-CM

## 2023-03-08 ENCOUNTER — Other Ambulatory Visit: Payer: Self-pay | Admitting: Nurse Practitioner

## 2023-03-08 ENCOUNTER — Ambulatory Visit
Admission: RE | Admit: 2023-03-08 | Discharge: 2023-03-08 | Disposition: A | Discharge status: Home / Self Care | Payer: Medicare Other | Source: Ambulatory Visit | Attending: Nurse Practitioner | Admitting: Nurse Practitioner

## 2023-03-08 DIAGNOSIS — N6310 Unspecified lump in the right breast, unspecified quadrant: Secondary | ICD-10-CM

## 2023-03-08 DIAGNOSIS — R928 Other abnormal and inconclusive findings on diagnostic imaging of breast: Secondary | ICD-10-CM

## 2023-03-09 ENCOUNTER — Ambulatory Visit
Admission: RE | Admit: 2023-03-09 | Discharge: 2023-03-09 | Disposition: A | Discharge status: Home / Self Care | Payer: Medicare Other | Source: Ambulatory Visit | Attending: Nurse Practitioner | Admitting: Nurse Practitioner

## 2023-03-09 DIAGNOSIS — N6310 Unspecified lump in the right breast, unspecified quadrant: Secondary | ICD-10-CM

## 2023-03-09 HISTORY — PX: BREAST BIOPSY: SHX20

## 2023-03-10 ENCOUNTER — Telehealth: Payer: Self-pay | Admitting: *Deleted

## 2023-03-10 LAB — SURGICAL PATHOLOGY

## 2023-03-10 NOTE — Telephone Encounter (Signed)
Error

## 2023-03-10 NOTE — Telephone Encounter (Signed)
Spoke with patient to confirm [facility] appt for 03/16/23 at 8:15am.  Instructions given. Emailed intake form and gave contact information.

## 2023-03-14 ENCOUNTER — Encounter: Payer: Self-pay | Admitting: *Deleted

## 2023-03-14 DIAGNOSIS — C50411 Malignant neoplasm of upper-outer quadrant of right female breast: Secondary | ICD-10-CM

## 2023-03-14 DIAGNOSIS — Z170 Estrogen receptor positive status [ER+]: Secondary | ICD-10-CM | POA: Insufficient documentation

## 2023-03-16 ENCOUNTER — Encounter: Payer: Self-pay | Admitting: Physical Therapy

## 2023-03-16 ENCOUNTER — Ambulatory Visit: Payer: Medicare Other | Attending: General Surgery | Admitting: Physical Therapy

## 2023-03-16 ENCOUNTER — Ambulatory Visit
Admission: RE | Admit: 2023-03-16 | Discharge: 2023-03-16 | Disposition: A | Discharge status: Home / Self Care | Payer: Medicare Other | Source: Ambulatory Visit | Attending: Radiation Oncology | Admitting: Radiation Oncology

## 2023-03-16 ENCOUNTER — Other Ambulatory Visit: Payer: Self-pay | Admitting: General Surgery

## 2023-03-16 ENCOUNTER — Inpatient Hospital Stay: Payer: Medicare Other | Attending: Hematology and Oncology | Admitting: Hematology and Oncology

## 2023-03-16 ENCOUNTER — Encounter: Payer: Self-pay | Admitting: *Deleted

## 2023-03-16 ENCOUNTER — Other Ambulatory Visit: Payer: Self-pay

## 2023-03-16 ENCOUNTER — Inpatient Hospital Stay: Payer: Medicare Other | Admitting: Genetic Counselor

## 2023-03-16 ENCOUNTER — Inpatient Hospital Stay: Payer: Medicare Other | Admitting: Licensed Clinical Social Worker

## 2023-03-16 ENCOUNTER — Encounter: Payer: Self-pay | Admitting: Genetic Counselor

## 2023-03-16 ENCOUNTER — Encounter: Payer: Self-pay | Admitting: Radiation Oncology

## 2023-03-16 ENCOUNTER — Other Ambulatory Visit: Payer: Self-pay | Admitting: *Deleted

## 2023-03-16 ENCOUNTER — Inpatient Hospital Stay: Payer: Medicare Other

## 2023-03-16 VITALS — BP 113/58 | HR 72 | Temp 97.5°F | Resp 16 | Wt 171.1 lb

## 2023-03-16 DIAGNOSIS — Z170 Estrogen receptor positive status [ER+]: Secondary | ICD-10-CM

## 2023-03-16 DIAGNOSIS — Z87891 Personal history of nicotine dependence: Secondary | ICD-10-CM

## 2023-03-16 DIAGNOSIS — Z803 Family history of malignant neoplasm of breast: Secondary | ICD-10-CM | POA: Diagnosis not present

## 2023-03-16 DIAGNOSIS — R293 Abnormal posture: Secondary | ICD-10-CM | POA: Diagnosis not present

## 2023-03-16 DIAGNOSIS — C50411 Malignant neoplasm of upper-outer quadrant of right female breast: Secondary | ICD-10-CM

## 2023-03-16 LAB — CBC WITH DIFFERENTIAL (CANCER CENTER ONLY)
Abs Immature Granulocytes: 0.02 10*3/uL (ref 0.00–0.07)
Basophils Absolute: 0 10*3/uL (ref 0.0–0.1)
Basophils Relative: 1 %
Eosinophils Absolute: 0.1 10*3/uL (ref 0.0–0.5)
Eosinophils Relative: 1 %
HCT: 40.3 % (ref 36.0–46.0)
Hemoglobin: 13.6 g/dL (ref 12.0–15.0)
Immature Granulocytes: 0 %
Lymphocytes Relative: 42 %
Lymphs Abs: 3 10*3/uL (ref 0.7–4.0)
MCH: 31.8 pg (ref 26.0–34.0)
MCHC: 33.7 g/dL (ref 30.0–36.0)
MCV: 94.2 fL (ref 80.0–100.0)
Monocytes Absolute: 0.6 10*3/uL (ref 0.1–1.0)
Monocytes Relative: 9 %
Neutro Abs: 3.3 10*3/uL (ref 1.7–7.7)
Neutrophils Relative %: 47 %
Platelet Count: 313 10*3/uL (ref 150–400)
RBC: 4.28 MIL/uL (ref 3.87–5.11)
RDW: 12.8 % (ref 11.5–15.5)
WBC Count: 7 10*3/uL (ref 4.0–10.5)
nRBC: 0 % (ref 0.0–0.2)

## 2023-03-16 LAB — CMP (CANCER CENTER ONLY)
ALT: 32 U/L (ref 0–44)
AST: 22 U/L (ref 15–41)
Albumin: 4.2 g/dL (ref 3.5–5.0)
Alkaline Phosphatase: 79 U/L (ref 38–126)
Anion gap: 6 (ref 5–15)
BUN: 19 mg/dL (ref 8–23)
CO2: 26 mmol/L (ref 22–32)
Calcium: 9.3 mg/dL (ref 8.9–10.3)
Chloride: 108 mmol/L (ref 98–111)
Creatinine: 0.91 mg/dL (ref 0.44–1.00)
GFR, Estimated: >60 mL/min (ref >60)
Glucose, Bld: 101 mg/dL — ABNORMAL HIGH (ref 70–99)
Potassium: 4.2 mmol/L (ref 3.5–5.1)
Sodium: 140 mmol/L (ref 135–145)
Total Bilirubin: 0.5 mg/dL (ref <1.2)
Total Protein: 7 g/dL (ref 6.5–8.1)

## 2023-03-16 LAB — GENETIC SCREENING ORDER

## 2023-03-16 NOTE — Assessment & Plan Note (Signed)
This is a very pleasant 67 year old postmenopausal female patient with newly diagnosed right breast invasive lobular carcinoma, ER/PR positive, Ki-67 of 5% HER2 2+ FISH negative referred to breast [facility] for additional recommendations.  Given small tumor, strong ER/PR positivity, lobular histology and slow growth rate, she will proceed with upfront surgery followed by Oncotype DX testing.  We have discussed the following details about Oncotype DX.  We have discussed about Oncotype Dx score which is a well validated prognostic scoring system which can predict outcome with endocrine therapy alone and whether chemotherapy reduces recurrence.  Typically in patients with ER positive cancers that are node negative if the RS score is high typically greater than or equal to 26, chemotherapy is recommended.  In women with intermediate recurrence score younger than 50, there can still be some role for chemotherapy in addition to endocrine therapy especially if the recurrence score is between 21-25. If chemotherapy is needed, this will precede radiation and then after radiation she will continue on antiestrogen therapy.  We have also discussed about antiestrogen therapy. We have discussed options for antiestrogen therapy today.  We have focused our discussion on aromatase inhibitors today. With regards to aromatase inhibitors, we discussed mechanism of action, adverse effects including but not limited to post menopausal symptoms, arthralgias, myalgias, increased risk of cardiovascular events and bone loss.  She is willing to proceed with anastrozole.  She will return to clinic after surgery to review Oncotype DX results and to discuss any additional recommendations.

## 2023-03-16 NOTE — Progress Notes (Signed)
REFERRING PROVIDER: Rachel Moulds, MD  PRIMARY PROVIDER:  [name], NP  PRIMARY REASON FOR VISIT:  1. Malignant neoplasm of upper-outer quadrant of right breast in female, estrogen receptor positive (HCC)   2. Family history of breast cancer     HISTORY OF PRESENT ILLNESS:   Brittany [name], a 67 y.o. female, was seen for a [place] cancer genetics consultation during the breast multidisciplinary clinic at the request of Dr. Al Pimple due to a personal and family history of cancer.  Brittany [name] presents to clinic today to discuss the possibility of a hereditary predisposition to cancer, to discuss genetic testing, and to further clarify her future cancer risks, as well as potential cancer risks for family members.   In November 2024, at the age of 59, Brittany [name] was diagnosed with invasive mammary carcinoma of the right breast (ER/PR positive, HER2 negative).   CANCER HISTORY:  Oncology History  Malignant neoplasm of upper-outer quadrant of right breast in female, estrogen receptor positive (HCC)  02/22/2023 Mammogram   Screening mammogram on February 22, 2023 showed possible mass in the right breast with calcifications warranting further evaluation.  No findings suspicious for malignancy in the left breast.  Diagnostic mammogram done confirmed a persistent irregular mass within the central right breast, adjacent to the mass are slightly heterogeneous calcifications.  Ultrasound showed 1.1 x 1.2 x 1.3 cm irregular hypoechoic mass at 10 o'clock position of the right breast 1 cm from the nipple, no abnormal right axillary lymph nodes are noted.   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  Prognostic showed ER 100% positive strong staining PR 95% positive strong staining Ki-67 of 5% and HER2 2+   03/14/2023 Initial Diagnosis   Malignant  neoplasm of upper-outer quadrant of right breast in female, estrogen receptor positive (HCC)   03/16/2023 Cancer Staging   Staging form: Breast, AJCC 8th Edition - Clinical stage from 03/16/2023: Stage IA (cT1c, cN0, cM0, G2, ER+, PR+, HER2-) - Signed by Ronny Bacon, PA-C on 03/16/2023 Stage prefix: Initial diagnosis Histologic grading system: 3 grade system      RISK FACTORS:  Menarche was at age 79.  First live birth at age 13.  OCP use for approximately on and off for 3-4 years.  Ovaries intact: yes.  Uterus intact: no.  Menopausal status: postmenopausal.  HRT use: 2 years. Colonoscopy: yes; normal. Mammogram within the last year: yes. Any excessive radiation exposure in the past: no  Past Medical History:  Diagnosis Date   Anxiety and depression    Elevated LDL cholesterol level    Elevated liver enzymes 07/23/2021   GERD (gastroesophageal reflux disease)    Thyroid disorder 11/14/2017    Past Surgical History:  Procedure Laterality Date   BREAST BIOPSY Right 03/09/2023   [place] RT BREAST BX W LOC DEV 1ST LESION IMG BX SPEC US GUIDE 03/09/2023 [facility] MAMMOGRAPHY   HERNIA REPAIR     LAPAROSCOPIC HYSTERECTOMY  03/2017   Partial, pt still has both adnexa    Social History   Socioeconomic History   Marital status: Divorced    Spouse name: Not on file   Number of children: Not on file   Years of education: Not on file   Highest education level: Master's degree (e.g., MA, MS, MEng, MEd, MSW, MBA)  Occupational History   Not on file  Tobacco  Use   Smoking status: Former   Smokeless tobacco: Never   Tobacco comments:    stopped at age 62. smoked for 4-5 years 1 ppd  Vaping Use   Vaping status: Never Used  Substance and Sexual Activity   Alcohol use: Yes    Comment: WINE OCC   Drug use: Never   Sexual activity: Not Currently    Partners: Male    Birth control/protection: Surgical    Comment: hysterectomy  Other Topics Concern   Not on file  Social  History Narrative   Not on file   Social Determinants of Health   Financial Resource Strain: Low Risk  (01/24/2023)   Overall Financial Resource Strain (CARDIA)    Difficulty of Paying Living Expenses: Not very hard  Food Insecurity: No Food Insecurity (03/16/2023)   Hunger Vital Sign    Worried About Running Out of Food in the Last Year: Never true    Ran Out of Food in the Last Year: Never true  Transportation Needs: No Transportation Needs (03/16/2023)   PRAPARE - Administrator, Civil Service (Medical): No    Lack of Transportation (Non-Medical): No  Physical Activity: Sufficiently Active (01/24/2023)   Exercise Vital Sign    Days of Exercise per Week: 3 days    Minutes of Exercise per Session: 120 min  Stress: Stress Concern Present (01/24/2023)   [employer] of Occupational Health - Occupational Stress Questionnaire    Feeling of Stress : To some extent  Social Connections: Unknown (01/24/2023)   Social Connection and Isolation Panel [NHANES]    Frequency of Communication with Friends and Family: More than three times a week    Frequency of Social Gatherings with Friends and Family: Twice a week    Attends Religious Services: Patient declined    Database administrator or Organizations: No    Attends Engineer, structural: Not on file    Marital Status: Divorced     FAMILY HISTORY:  We obtained a detailed, 4-generation family history.  Significant diagnoses are listed below: Family History  Problem Relation Age of Onset   Breast cancer Mother 51   Diabetes Mother    Hypertension Mother    Cancer Father 70 - 26       liver?   Diabetes Brother    Cancer Paternal Uncle    Breast cancer Paternal Grandmother 45     Brittany [name]'s mother was diagnosed with breast cancer at age 44, she died at age 42. Her father was diagnosed with cancer, possibly liver cancer, in his 88s, he died at age 43. Her paternal uncle was diagnosed with cancer (unknown type)  at an unknown age, he is deceased. Brittany [name] paternal grandmother was diagnosed with breast cancer at age 37, she is deceased. Ms. Bassette is unaware of previous family history of genetic testing for hereditary cancer risks. There is maternal Ashkenazi Jewish ancestry.   GENETIC COUNSELING ASSESSMENT: Brittany [name] is a 67 y.o. female with a personal and family history of cancer which is somewhat suggestive of a hereditary cancer syndrome and predisposition to cancer. We, therefore, discussed and recommended the following at today's visit.   DISCUSSION: We discussed that 5 - 10% of cancer is hereditary, with most cases of hereditary breast cancer associated with mutations in BRCA1/2.  There are other genes that can be associated with hereditary breast cancer syndromes. Type of cancer risk and level of risk are gene-specific. We discussed that testing is beneficial  for several reasons including knowing how to follow individuals after completing their treatment, identifying whether potential treatment options would be beneficial, and understanding if other family members could be at risk for cancer and allowing them to undergo genetic testing.   We reviewed the characteristics, features and inheritance patterns of hereditary cancer syndromes. We also discussed genetic testing, including the appropriate family members to test, the process of testing, insurance coverage and turn-around-time for results. We discussed the implications of a negative, positive and/or variant of uncertain significant result. In order to get genetic test results in a timely manner so that Brittany [name] can use these genetic test results for surgical decisions, we recommended Brittany [name] pursue genetic testing for the [employer]. Once complete, we recommend Brittany [name] pursue reflex genetic testing to a more comprehensive gene panel.   Brittany [name] elected to have [facility] CancerNext-Expanded Panel. The  CancerNext-Expanded gene panel offered by [facility] and includes sequencing, rearrangement, and RNA analysis for the following 71 genes: AIP, ALK, APC, ATM, AXIN2, BAP1, BARD1, BMPR1A, BRCA1, BRCA2, BRIP1, CDC73, CDH1, CDK4, CDKN1B, CDKN2A, CHEK2, CTNNA1, DICER1, FH, FLCN, KIF1B, LZTR1, MAX, MEN1, MET, MLH1, MSH2, MSH3, MSH6, MUTYH, NF1, NF2, NTHL1, PALB2, PHOX2B, PMS2, POT1, PRKAR1A, PTCH1, PTEN, RAD51C, RAD51D, RB1, RET, SDHA, SDHAF2, SDHB, SDHC, SDHD, SMAD4, SMARCA4, SMARCB1, SMARCE1, STK11, SUFU, TMEM127, TP53, TSC1, TSC2, and VHL (sequencing and deletion/duplication); EGFR, EGLN1, HOXB13, KIT, MITF, PDGFRA, POLD1, and POLE (sequencing only); EPCAM and GREM1 (deletion/duplication only).   Based on Brittany [name]'s personal and family history of cancer, she meets medical criteria for genetic testing. Despite that she meets criteria, she may still have an out of pocket cost. We discussed that if her out of pocket cost for testing is over $100, the laboratory should contact them to discuss self-pay prices, patient pay assistance programs, if applicable, and other billing options.   PLAN: After considering the risks, benefits, and limitations, Brittany [name] provided informed consent to pursue genetic testing and the blood sample was sent to [facility] for analysis of the CancerNext-Expanded Panel. Results should be available within approximately 1-2 weeks' time, at which point they will be disclosed by telephone to Brittany [name], as will any additional recommendations warranted by these results. Brittany [name] will receive a summary of her genetic counseling visit and a copy of her results once available. This information will also be available in Epic.   Brittany [name] questions were answered to her satisfaction today. Our contact information was provided should additional questions or concerns arise. Thank you for the referral and allowing [place] to share in the care of your patient.   [name], MS, [facility] Genetic Counselor [email or web address] [email or web address] (P) [phone]  The patient was seen for a total of 20 minutes in face-to-face genetic counseling.  The patient was seen alone.  Drs. Pamelia Hoit and/or Mosetta Putt were available to discuss this case as needed.  _______________________________________________________________________ For Office Staff:  Number of people involved in session: 1 Was an Intern/ student involved with case: no

## 2023-03-16 NOTE — Therapy (Signed)
OUTPATIENT PHYSICAL THERAPY BREAST CANCER BASELINE EVALUATION   Patient Name: Brittany [name] MRN: [id number] DOB:07-04-1955, 67 y.o., female Today's Date: 03/16/2023  END OF SESSION:  PT End of Session - 03/16/23 1158     Visit Number 1    Number of Visits 2    Date for PT Re-Evaluation 05/11/23    PT Start Time 1052    PT Stop Time 1116    PT Time Calculation (min) 24 min    Activity Tolerance Patient tolerated treatment well    Behavior During Therapy [facility] for tasks assessed/performed             Past Medical History:  Diagnosis Date   Anxiety and depression    Elevated LDL cholesterol level    Elevated liver enzymes 07/23/2021   GERD (gastroesophageal reflux disease)    Thyroid disorder 11/14/2017   Past Surgical History:  Procedure Laterality Date   BREAST BIOPSY Right 03/09/2023   [place] RT BREAST BX W LOC DEV 1ST LESION IMG BX SPEC US GUIDE 03/09/2023 [facility]MO[facility]   HERNIA REPAIR     LAPAROSCOPIC HYSTERECTOMY  03/2017   Partial, pt still has both adnexa   Patient Active Problem List   Diagnosis Date Noted   Malignant neoplasm of upper-outer quadrant of right breast in female, estrogen receptor positive (HCC) 03/14/2023   Arthralgia of right knee 01/28/2023   Pneumococcal vaccination administered at current visit 01/28/2023   Decreased GFR 12/25/2021   Encounter for general adult medical examination with abnormal findings 07/23/2021   Overweight (BMI 25.0-29.9) 07/23/2021   Hyperlipidemia 07/23/2021   Prediabetes 07/23/2021   Ceruminosis, bilateral 02/18/2021   Vitamin D deficiency 04/01/2020   Elevated LDL cholesterol level    Anxiety and depression 11/14/2017   Abnormal mammogram with microcalcification 11/14/2017   Thyroid disorder 11/14/2017    REFERRING PROVIDER: Dr. Almond Lint  REFERRING DIAG: Right breast cancer  THERAPY DIAG:  Malignant neoplasm of upper-outer quadrant of right breast in female, estrogen receptor positive  (HCC)  Abnormal posture  Rationale for Evaluation and Treatment: Rehabilitation  ONSET DATE: 02/22/2023  SUBJECTIVE:                                                                                                                                                                                           SUBJECTIVE STATEMENT: Patient reports she is here today to be seen by her medical team for her newly diagnosed right breast cancer.   PERTINENT HISTORY:  Patient was diagnosed on 02/22/2023 with right grade 2 invasive ductal carcinoma breast cancer. It measures 1.3 cm and is located in the upper outer  quadrant. It is ER/PR positive and HER2 negative with a Ki67 of 5%.   PATIENT GOALS:   reduce lymphedema risk and learn post op HEP.   PAIN:  Are you having pain? No  PRECAUTIONS: Active CA   RED FLAGS: None   HAND DOMINANCE: right  WEIGHT BEARING RESTRICTIONS: No  FALLS:  Has patient fallen in last 6 months? No  LIVING ENVIRONMENT: Patient lives with: alone Lives in: House/apartment Has following equipment at home: None  OCCUPATION: Retired Emergency planning/management officer  LEISURE: She walks 2x/week for 45 min and does yoga once a week  PRIOR LEVEL OF FUNCTION: Independent   OBJECTIVE: Note: Objective measures were completed at Evaluation unless otherwise noted.  COGNITION: Overall cognitive status: Within functional limits for tasks assessed    POSTURE:  Forward head and rounded shoulders posture  UPPER EXTREMITY AROM/PROM:  A/PROM RIGHT   eval   Shoulder extension 44  Shoulder flexion 168  Shoulder abduction 166  Shoulder internal rotation 72  Shoulder external rotation 74    (Blank rows = not tested)  A/PROM LEFT   eval  Shoulder extension 52  Shoulder flexion 164  Shoulder abduction 172  Shoulder internal rotation 72  Shoulder external rotation 82    (Blank rows = not tested)  CERVICAL AROM: All within normal limits  UPPER EXTREMITY STRENGTH:  WNL  LYMPHEDEMA ASSESSMENTS (in cm):   LANDMARK RIGHT   eval  10 cm proximal to olecranon process 30.3  Olecranon process 24.5  10 cm proximal to ulnar styloid process 23.2  Just proximal to ulnar styloid process 15.7  Across hand at thumb web space 19.2  At base of 2nd digit 6.4  (Blank rows = not tested)  LANDMARK LEFT   eval  10 cm proximal to olecranon process 31.4  Olecranon process 24.4  10 cm proximal to ulnar styloid process 22.3  Just proximal to ulnar styloid process 15.3  Across hand at thumb web space 18.2  At base of 2nd digit 6.1  (Blank rows = not tested)  L-DEX LYMPHEDEMA SCREENING:  The patient was assessed using the L-Dex machine today to produce a lymphedema index baseline score. The patient will be reassessed on a regular basis (typically every 3 months) to obtain new L-Dex scores. If the score is > 6.5 points away from his/her baseline score indicating onset of subclinical lymphedema, it will be recommended to wear a compression garment for 4 weeks, 12 hours per day and then be reassessed. If the score continues to be > 6.5 points from baseline at reassessment, we will initiate lymphedema treatment. Assessing in this manner has a 95% rate of preventing clinically significant lymphedema.   L-DEX FLOWSHEETS - 03/16/23 1100       L-DEX LYMPHEDEMA SCREENING   Measurement Type Unilateral    L-DEX MEASUREMENT EXTREMITY Upper Extremity    POSITION  Standing    DOMINANT SIDE Right    At Risk Side Right    BASELINE SCORE (UNILATERAL) 2.1             QUICK DASH SURVEY:  Neldon Mc - 03/16/23 0001     Open a tight or new jar Moderate difficulty    Do heavy household chores (wash walls, wash floors) Mild difficulty    Carry a shopping bag or briefcase No difficulty    Wash your back No difficulty    Use a knife to cut food No difficulty    Recreational activities in which you take some force or  impact through your arm, shoulder, or hand (golf, hammering,  tennis) No difficulty    During the past week, to what extent has your arm, shoulder or hand problem interfered with your normal social activities with family, friends, neighbors, or groups? Not at all    During the past week, to what extent has your arm, shoulder or hand problem limited your work or other regular daily activities Not at all    Arm, shoulder, or hand pain. None    Tingling (pins and needles) in your arm, shoulder, or hand None    Difficulty Sleeping No difficulty    DASH Score 6.82 %              PATIENT EDUCATION:  Education details: Lymphedema risk reduction and post op shoulder/posture HEP Person educated: Patient Education method: Explanation, Demonstration, Handout Education comprehension: Patient verbalized understanding and returned demonstration  HOME EXERCISE PROGRAM: Patient was instructed today in a home exercise program today for post op shoulder range of motion. These included active assist shoulder flexion in sitting, scapular retraction, wall walking with shoulder abduction, and hands behind head external rotation.  She was encouraged to do these twice a day, holding 3 seconds and repeating 5 times when permitted by her physician.   ASSESSMENT:  CLINICAL IMPRESSION: Patient was diagnosed on 02/22/2023 with right grade 2 invasive ductal carcinoma breast cancer. It measures 1.3 cm and is located in the upper outer quadrant. It is ER/PR positive and HER2 negative with a Ki67 of 5%. Her multidisciplinary medical team met prior to her assessments to determine a recommended treatment plan. She is planning to have a right lumpectomy and sentinel node biopsy followed by Oncotype testing, radiation, and anti-estrogen therapy. She will benefit from a post op PT reassessment to determine needs and from L-Dex screens every 3 months for 2 years to detect subclinical lymphedema.  Pt will benefit from skilled therapeutic intervention to improve on the following  deficits: Decreased knowledge of precautions, impaired UE functional use, pain, decreased ROM, postural dysfunction.   PT treatment/interventions: ADL/self-care home management, pt/family education, therapeutic exercise  REHAB POTENTIAL: Excellent  CLINICAL DECISION MAKING: Stable/uncomplicated  EVALUATION COMPLEXITY: Low   GOALS: Goals reviewed with patient? YES  LONG TERM GOALS: (STG=LTG)    Name Target Date Goal status  1 Pt will be able to verbalize understanding of pertinent lymphedema risk reduction practices relevant to her dx specifically related to skin care.  Baseline:  No knowledge 03/16/2023 Achieved at eval  2 Pt will be able to return demo and/or verbalize understanding of the post op HEP related to regaining shoulder ROM. Baseline:  No knowledge 03/16/2023 Achieved at eval  3 Pt will be able to verbalize understanding of the importance of attending the post op After Breast CA Class for further lymphedema risk reduction education and therapeutic exercise.  Baseline:  No knowledge 03/16/2023 Achieved at eval  4 Pt will demo she has regained full shoulder ROM and function post operatively compared to baselines.  Baseline: See objective measurements taken today. 05/11/2023     PLAN:  PT FREQUENCY/DURATION: EVAL and 1 follow up appointment.   PLAN FOR NEXT SESSION: will reassess 3-4 weeks post op to determine needs.   Patient will follow up at outpatient cancer rehab 3-4 weeks following surgery.  If the patient requires physical therapy at that time, a specific plan will be dictated and sent to the referring physician for approval. The patient was educated today on appropriate basic range of motion  exercises to begin post operatively and the importance of attending the After Breast Cancer class following surgery.  Patient was educated today on lymphedema risk reduction practices as it pertains to recommendations that will benefit the patient immediately following surgery.  She  verbalized good understanding.    Physical Therapy Information for After Breast Cancer Surgery/Treatment:  Lymphedema is a swelling condition that you may be at risk for in your arm if you have lymph nodes removed from the armpit area.  After a sentinel node biopsy, the risk is approximately 5-9% and is higher after an axillary node dissection.  There is treatment available for this condition and it is not life-threatening.  Contact your physician or physical therapist with concerns. You may begin the 4 shoulder/posture exercises (see additional sheet) when permitted by your physician (typically a week after surgery).  If you have drains, you may need to wait until those are removed before beginning range of motion exercises.  A general recommendation is to not lift your arms above shoulder height until drains are removed.  These exercises should be done to your tolerance and gently.  This is not a "no pain/no gain" type of recovery so listen to your body and stretch into the range of motion that you can tolerate, stopping if you have pain.  If you are having immediate reconstruction, ask your plastic surgeon about doing exercises as he or she may want you to wait. We encourage you to attend the free one time ABC (After Breast Cancer) class offered by [facility].  You will learn information related to lymphedema risk, prevention and treatment and additional exercises to regain mobility following surgery.  You can call [phone] for more information.  This is offered the 1st and 3rd Monday of each month.  You only attend the class one time. While undergoing any medical procedure or treatment, try to avoid blood pressure being taken or needle sticks from occurring on the arm on the side of cancer.   This recommendation begins after surgery and continues for the rest of your life.  This may help reduce your risk of getting lymphedema (swelling in your arm). An excellent resource for  those seeking information on lymphedema is the National Lymphedema Network's web site. It can be accessed at www.lymphnet.org If you notice swelling in your hand, arm or breast at any time following surgery (even if it is many years from now), please contact your doctor or physical therapist to discuss this.  Lymphedema can be treated at any time but it is easier for you if it is treated early on.  If you feel like your shoulder motion is not returning to normal in a reasonable amount of time, please contact your surgeon or physical therapist.  [facility] [phone]. [address], [place] [address]  ABC CLASS After Breast Cancer Class  After Breast Cancer Class is a specially designed exercise class to assist you in a safe recover after having breast cancer surgery.  In this class you will learn how to get back to full function whether your drains were just removed or if you had surgery a month ago.  This one-time class is held the 1st and 3rd Monday of every month from 11:00 a.m. until 12:00 noon virtually.  This class is FREE and space is limited. For more information or to register for the next available class, call [phone].  Class Goals  Understand specific stretches to  improve the flexibility of you chest and shoulder. Learn ways to safely strengthen your upper body and improve your posture. Understand the warning signs of infection and why you may be at risk for an arm infection. Learn about Lymphedema and prevention.  ** You do not attend this class until after surgery.  Drains must be removed to participate  Patient was instructed today in a home exercise program today for post op shoulder range of motion. These included active assist shoulder flexion in sitting, scapular retraction, wall walking with shoulder abduction, and hands behind head external rotation.  She was encouraged to do these twice a day, holding 3 seconds and  repeating 5 times when permitted by her physician.  Bethann Punches, [place] 03/16/23 12:06 PM

## 2023-03-16 NOTE — Progress Notes (Signed)
[facility] Clinical Social Work  Initial Assessment   Brittany [name] is a 67 y.o. year old female presenting alone. Clinical Social Work was referred by  [facility]  for assessment of psychosocial needs.   SDOH (Social Determinants of Health) assessments performed: Yes SDOH Interventions    Flowsheet Row Clinical Support from 03/16/2023 in [facility] [place]  SDOH Interventions   Food Insecurity Interventions Intervention Not Indicated  Housing Interventions Intervention Not Indicated  Transportation Interventions Intervention Not Indicated  Utilities Interventions Intervention Not Indicated       SDOH Screenings   Food Insecurity: No Food Insecurity (03/16/2023)  Housing: Low Risk  (03/16/2023)  Transportation Needs: No Transportation Needs (03/16/2023)  Utilities: Not At Risk (03/16/2023)  Alcohol Screen: Low Risk  (01/24/2023)  Depression (PHQ2-9): Medium Risk (01/28/2023)  Financial Resource Strain: Low Risk  (01/24/2023)  Physical Activity: Sufficiently Active (01/24/2023)  Social Connections: Unknown (01/24/2023)  Stress: Stress Concern Present (01/24/2023)  Tobacco Use: Medium Risk (03/16/2023)     Distress Screen completed: No     No data to display            Family/Social Information:  Housing Arrangement: patient lives alone. Adult daughter lives in [place] Family members/support persons in your life? Family, Friends, and Passenger transport manager concerns: no  Employment: Retired in July 2024.  Income source: Actor concerns: No Type of concern: None Food access concerns: no Religious or spiritual practice: Not known Services Currently in place:  Medicare + supplement  Coping/ Adjustment to diagnosis: Patient understands treatment plan and what happens next? yes, she has had a small increase in anxiety and depression (history of depression managed with Prozac and counseling in the past) but  feels it has been manageable, especially after learning more today. She has good insight into how her body feels when anxiety/depression are increasing and what she can do that can help Patient reported stressors: Depression, Anxiety/ nervousness, and Adjusting to my illness Patient enjoys  yoga, cooking, [place] dramas Current coping skills/ strengths: Ability for insight , Capable of independent living , Manufacturing systems engineer , Motivation for treatment/growth , and Supportive family/friends     SUMMARY: Current SDOH Barriers:  No major barriers noted today  Clinical Social Work Clinical Goal(s):  No clinical social work goals at this time  Interventions: Discussed common feeling and emotions when being diagnosed with cancer, and the importance of support during treatment Informed patient of the support team roles and support services at [facility] Provided CSW contact information and encouraged patient to call with any questions or concerns   Follow Up Plan: Patient will contact CSW with any support or resource needs Patient verbalizes understanding of plan: Yes    Shareese Macha E Manuel [place]head, LCSW Clinical Social Worker [facility]

## 2023-03-16 NOTE — Progress Notes (Signed)
Radiation Oncology         [phone] ________________________________  Name: Brittany [name]        MRN: [id number]  Date of Service: 03/16/2023 DOB: 1955-08-10  YQ:[id number], [place] Scrape, NP  Almond Lint, MD     REFERRING PHYSICIAN: Almond Lint, MD   DIAGNOSIS: The encounter diagnosis was Malignant neoplasm of upper-outer quadrant of right breast in female, estrogen receptor positive (HCC).   HISTORY OF PRESENT ILLNESS: Brittany [name] is a 67 y.o. female seen in the multidisciplinary breast clinic for a new diagnosis of right breast cancer. The patient was noted to have screening detected mass and calcifications in the right breast. She underwent diagnostic imaging on 03/08/23 including an ultrasound that showed a 1.3 cm mass in the 10:00 position. Her axilla was negative for adenopathy. She underwent a biopsy on 03/09/23 that showed a grade 2 invasive lobular carcinoma that was ER/PR positive, HER2 negative with a Ki 67 of 5%. She's seen today to discuss treatment of her cancer.     PREVIOUS RADIATION THERAPY: No   PAST MEDICAL HISTORY:  Past Medical History:  Diagnosis Date   Anxiety and depression    Elevated LDL cholesterol level    Elevated liver enzymes 07/23/2021   GERD (gastroesophageal reflux disease)    Thyroid disorder 11/14/2017       PAST SURGICAL HISTORY: Past Surgical History:  Procedure Laterality Date   BREAST BIOPSY Right 03/09/2023   [place] RT BREAST BX W LOC DEV 1ST LESION IMG BX SPEC US GUIDE 03/09/2023 [facility]   HERNIA REPAIR     LAPAROSCOPIC HYSTERECTOMY  03/2017   Partial, pt still has both adnexa     FAMILY HISTORY:  Family History  Problem Relation Age of Onset   Breast cancer Mother 26   Diabetes Mother    Hypertension Mother    Cancer Father        liver?   Diabetes Brother    Breast cancer Paternal Grandmother      SOCIAL HISTORY:  reports that she has quit smoking. She has never used smokeless tobacco. She reports  current alcohol use. She reports that she does not use drugs. The patient is divorced and lives in [place]. 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   ALLERGIES: Wellbutrin [bupropion]   MEDICATIONS:  Current Outpatient Medications  Medication Sig Dispense Refill   ALPHA LIPOIC ACID PO Take 600 mg by mouth daily.     ASHWAGANDHA PO Take by mouth.     azelastine (ASTELIN) 0.1 % nasal spray Place 1 spray into both nostrils 2 (two) times daily as needed. Use in each nostril as directed 30 mL 5   Biotin 10 MG TABS Take 1 tablet by mouth daily.     cetirizine (ZYRTEC ALLERGY) 10 MG tablet Take 1 tablet (10 mg total) by mouth daily as needed for allergies. 30 tablet 5   cycloSPORINE (RESTASIS) 0.05 % ophthalmic emulsion Place 1 drop into both eyes 2 (two) times daily.     EVENING PRIMROSE OIL PO Take by mouth.     famotidine (PEPCID) 20 MG tablet Take 1 tablet (20 mg total) by mouth 2 (two) times daily as needed for heartburn. 60 tablet 5   Flaxseed, Linseed, (FLAX SEED OIL PO) Take by mouth as needed.     MAGNESIUM CITRATE PO Take by mouth.     Methylsulfonylmethane (MSM) 1000 MG TABS Take 1 tablet by mouth. Takes three  times a week     Omega-3 Fatty Acids (FISH OIL PO) Take 1 capsule by mouth as needed.     OVER THE COUNTER MEDICATION Holy basil -   5 times a week     Probiotic Product (PROBIOTIC-10 PO) Take 1 tablet by mouth. Takes 3 times per week     vitamin B-12 (CYANOCOBALAMIN) 500 MCG tablet Take 500 mcg by mouth daily.     VITAMIN D PO Take by mouth.     No current facility-administered medications for this encounter.     REVIEW OF SYSTEMS: On review of systems, the patient reports that she is doing well overall in navigating the diagnosis. She has noted some bruising of the right breast since her biopsy. No other complaints are verbalized.      PHYSICAL EXAM:  Wt Readings from Last 3 Encounters:  03/16/23 171 lb 1.6 oz (77.6  kg)  01/28/23 170 lb 2 oz (77.2 kg)  10/15/22 170 lb 4.8 oz (77.2 kg)   Temp Readings from Last 3 Encounters:  03/16/23 (!) 97.5 F (36.4 C) (Temporal)  01/28/23 97.9 F (36.6 C) (Temporal)  10/15/22 98 F (36.7 C) (Temporal)   BP Readings from Last 3 Encounters:  03/16/23 (!) 113/58  01/28/23 114/86  10/15/22 110/74   Pulse Readings from Last 3 Encounters:  03/16/23 72  01/28/23 77  10/15/22 83    In general this is a well appearing female in no acute distress. She's alert and oriented x4 and appropriate throughout the examination. Cardiopulmonary assessment is negative for acute distress and she exhibits normal effort. Bilateral breast exam is deferred.    ECOG = 1  0 - Asymptomatic (Fully active, able to carry on all predisease activities without restriction)  1 - Symptomatic but completely ambulatory (Restricted in physically strenuous activity but ambulatory and able to carry out work of a light or sedentary nature. For example, light housework, office work)  2 - Symptomatic, <50% in bed during the day (Ambulatory and capable of all self care but unable to carry out any work activities. Up and about more than 50% of waking hours)  3 - Symptomatic, >50% in bed, but not bedbound (Capable of only limited self-care, confined to bed or chair 50% or more of waking hours)  4 - Bedbound (Completely disabled. Cannot carry on any self-care. Totally confined to bed or chair)  5 - Death   Santiago Glad MM, Creech RH, Tormey DC, et al. [id number]). "Toxicity and response criteria of the [facility]". Am. Evlyn Clines. Oncol. 5 (6): 649-55    LABORATORY DATA:  Lab Results  Component Value Date   WBC 7.0 03/16/2023   HGB 13.6 03/16/2023   HCT 40.3 03/16/2023   MCV 94.2 03/16/2023   PLT 313 03/16/2023   Lab Results  Component Value Date   NA 140 03/16/2023   K 4.2 03/16/2023   CL 108 03/16/2023   CO2 26 03/16/2023   Lab Results  Component Value Date   ALT 32  03/16/2023   AST 22 03/16/2023   ALKPHOS 79 03/16/2023   BILITOT 0.5 03/16/2023      RADIOGRAPHY: [place] RT BREAST BX W LOC DEV 1ST LESION IMG BX SPEC US GUIDE  Addendum Date: 03/11/2023   ADDENDUM REPORT: 03/11/2023 08:24 ADDENDUM: Pathology revealed GRADE II INVASIVE MAMMARY CARCINOMA of the RIGHT breast, upper outer quadrant, 10 o'clock, 1cmfn, (coil clip). This was found to be concordant by Dr. Baird Lyons. Pathology results were discussed with  the patient by telephone. The patient reported doing well after the biopsy with tenderness at the site. Post biopsy instructions and care were reviewed and questions were answered. The patient was encouraged to call [facility] for any additional concerns. My direct phone number was provided. The patient was referred to [facility] on March 16, 2023. Pathology results reported by Rene Kocher, RN on 03/10/2023. Electronically Signed   By: Baird Lyons M.D.   On: 03/11/2023 08:24   Result Date: 03/11/2023 CLINICAL DATA:  Right breast mass. EXAM: ULTRASOUND GUIDED RIGHT BREAST CORE NEEDLE BIOPSY COMPARISON:  Previous exam(s). PROCEDURE: I met with the patient and we discussed the procedure of ultrasound-guided biopsy, including benefits and alternatives. We discussed the high likelihood of a successful procedure. We discussed the risks of the procedure, including infection, bleeding, tissue injury, clip migration, and inadequate sampling. Informed written consent was given. The usual time-out protocol was performed immediately prior to the procedure. Lesion quadrant: Upper-outer quadrant Using sterile technique and 1% Lidocaine as local anesthetic, under direct ultrasound visualization, a 14 gauge spring-loaded device was used to perform biopsy of a mass in the 10 o'clock region of the right breast using a lateral to medial approach. At the conclusion of the  procedure coil shaped tissue marker clip was deployed into the biopsy cavity. Follow up 2 view mammogram was performed and dictated separately. IMPRESSION: Ultrasound guided biopsy of the right breast. No apparent complications. Electronically Signed: By: Baird Lyons M.D. On: 03/09/2023 11:07   MM CLIP PLACEMENT RIGHT  Result Date: 03/09/2023 CLINICAL DATA:  Status post ultrasound-guided core biopsy of a right breast mass. EXAM: 3D DIAGNOSTIC 2 3 MAMMOGRAM POST ULTRASOUND BIOPSY COMPARISON:  Previous exam(s). FINDINGS: 3D Mammographic images were obtained following ultrasound guided biopsy of a mass in the 10 o'clock region of the right breast. The biopsy marking clip is in expected location in the upper-outer quadrant of the right breast. IMPRESSION: Appropriate positioning of the coil shaped biopsy marking clip at the site of biopsy in the upper-outer quadrant of the right breast. Final Assessment: Post Procedure Mammograms for Marker Placement Electronically Signed   By: Baird Lyons M.D.   On: 03/09/2023 11:27   MM 3D DIAGNOSTIC MAMMOGRAM UNILATERAL RIGHT BREAST  Result Date: 03/08/2023 CLINICAL DATA:  67 year old female presents for further evaluation of possible RIGHT breast mass and calcifications identified on screening mammogram. EXAM: DIGITAL DIAGNOSTIC UNILATERAL RIGHT MAMMOGRAM WITH TOMOSYNTHESIS AND CAD; ULTRASOUND RIGHT BREAST LIMITED TECHNIQUE: Right digital diagnostic mammography and breast tomosynthesis was performed. The images were evaluated with computer-aided detection. ; Targeted ultrasound examination of the right breast was performed COMPARISON:  Previous exam(s). ACR Breast Density Category c: The breasts are heterogeneously dense, which may obscure small masses. FINDINGS: Full field, spot compression and magnification views of the RIGHT breast are performed. A persistent irregular mass within the central RIGHT breast is identified. Adjacent to this mass are slightly heterogeneous  calcifications, some which layer on the LATERAL view. Targeted ultrasound is performed, showing a 1.1 x 1.2 x 1.3 cm irregular hypoechoic mass at the 10 o'clock position of the RIGHT breast 1 cm from the nipple. No abnormal RIGHT axillary lymph nodes are noted. IMPRESSION: 1.3 cm highly suspicious mass within the UPPER OUTER/central RIGHT breast. Tissue sampling is recommended. No abnormal appearing RIGHT axillary lymph nodes. RECOMMENDATION: Ultrasound-guided RIGHT breast biopsy, which will be scheduled. I have discussed the findings and recommendations  with the patient. If applicable, a reminder letter will be sent to the patient regarding the next appointment. BI-RADS CATEGORY  5: Highly suggestive of malignancy. Electronically Signed   By: Harmon Pier M.D.   On: 03/08/2023 15:07   [place] LIMITED ULTRASOUND INCLUDING AXILLA RIGHT BREAST  Result Date: 03/08/2023 CLINICAL DATA:  67 year old female presents for further evaluation of possible RIGHT breast mass and calcifications identified on screening mammogram. EXAM: DIGITAL DIAGNOSTIC UNILATERAL RIGHT MAMMOGRAM WITH TOMOSYNTHESIS AND CAD; ULTRASOUND RIGHT BREAST LIMITED TECHNIQUE: Right digital diagnostic mammography and breast tomosynthesis was performed. The images were evaluated with computer-aided detection. ; Targeted ultrasound examination of the right breast was performed COMPARISON:  Previous exam(s). ACR Breast Density Category c: The breasts are heterogeneously dense, which may obscure small masses. FINDINGS: Full field, spot compression and magnification views of the RIGHT breast are performed. A persistent irregular mass within the central RIGHT breast is identified. Adjacent to this mass are slightly heterogeneous calcifications, some which layer on the LATERAL view. Targeted ultrasound is performed, showing a 1.1 x 1.2 x 1.3 cm irregular hypoechoic mass at the 10 o'clock position of the RIGHT breast 1 cm from the nipple. No abnormal RIGHT axillary  lymph nodes are noted. IMPRESSION: 1.3 cm highly suspicious mass within the UPPER OUTER/central RIGHT breast. Tissue sampling is recommended. No abnormal appearing RIGHT axillary lymph nodes. RECOMMENDATION: Ultrasound-guided RIGHT breast biopsy, which will be scheduled. I have discussed the findings and recommendations with the patient. If applicable, a reminder letter will be sent to the patient regarding the next appointment. BI-RADS CATEGORY  5: Highly suggestive of malignancy. Electronically Signed   By: Harmon Pier M.D.   On: 03/08/2023 15:07   MM 3D SCREENING MAMMOGRAM BILATERAL BREAST  Result Date: 02/24/2023 CLINICAL DATA:  Screening. EXAM: DIGITAL SCREENING BILATERAL MAMMOGRAM WITH TOMOSYNTHESIS AND CAD TECHNIQUE: Bilateral screening digital craniocaudal and mediolateral oblique mammograms were obtained. Bilateral screening digital breast tomosynthesis was performed. The images were evaluated with computer-aided detection. COMPARISON:  Previous exam(s). ACR Breast Density Category c: The breasts are heterogeneously dense, which may obscure small masses. FINDINGS: In the right breast, a possible mass with calcifications warrants further evaluation. In the left breast, no findings suspicious for malignancy. IMPRESSION: Further evaluation is suggested for a possible mass calcifications in the right breast. RECOMMENDATION: Diagnostic mammogram and possibly ultrasound of the right breast. (Code:[id number]) The patient will be contacted regarding the findings, and additional imaging will be scheduled. BI-RADS CATEGORY  0: Incomplete: Need additional imaging evaluation. Electronically Signed   By: Frederico Hamman M.D.   On: 02/24/2023 10:51       IMPRESSION/PLAN: 1. Stage IA, [id number], grade 2 ER/PR positive invasive lobular carcinoma of the right breast. Dr. Mitzi Hansen discusses the pathology findings and reviews the nature of early stage right breast disease. The consensus from the breast conference  includes MRI for extent of disease. Dr. Donell Beers would offer breast conservation with lumpectomy with  sentinel node biopsy. Dr. Al Pimple recommends Oncotype Dx score to determine a role for systemic therapy. Provided that chemotherapy is not indicated, the patient's course would then be followed by external radiotherapy to the breast  to reduce risks of local recurrence followed by antiestrogen therapy. We discussed the risks, benefits, short, and long term effects of radiotherapy, as well as the curative intent, and the patient is interested in proceeding. Dr. Mitzi Hansen discusses the delivery and logistics of radiotherapy and anticipates a course of 4 or up to 6 1/2  weeks of radiotherapy to  the right breast. We will see her back a few weeks after surgery to discuss the simulation process and anticipate we starting radiotherapy about 4-6 weeks after surgery.  2. Possible genetic predisposition to malignancy. The patient is a candidate for genetic testing given her personal and family history. She will meet with our geneticist today in clinic.   In a visit lasting 60 minutes, greater than 50% of the time was spent face to face reviewing her case, as well as in preparation of, discussing, and coordinating the patient's care.  The above documentation reflects my direct findings during this shared patient visit. Please see the separate note by Dr. Mitzi Hansen on this date for the remainder of the patient's plan of care.    Osker Mason, [facility]    **Disclaimer: This note was dictated with voice recognition software. Similar sounding words can inadvertently be transcribed and this note may contain transcription errors which may not have been corrected upon publication of note.**

## 2023-03-16 NOTE — Progress Notes (Signed)
[facility] CONSULT NOTE  Patient Care Team: [name], NP as PCP - General (Nurse Practitioner) Donnelly Angelica, RN as Oncology Nurse Navigator Pershing Proud, RN as Oncology Nurse Navigator Almond Lint, MD as Consulting Physician (General Surgery) Rachel Moulds, MD as Consulting Physician (Hematology and Oncology) Dorothy Puffer, MD as Consulting Physician (Radiation Oncology)  CHIEF COMPLAINTS/PURPOSE OF CONSULTATION:  Newly diagnosed breast cancer  HISTORY OF PRESENTING ILLNESS:  Brittany [name] 67 y.o. female is here because of recent diagnosis of right breast cancer  I reviewed her records extensively and collaborated the history with the patient.  SUMMARY OF ONCOLOGIC HISTORY: Oncology History  Malignant neoplasm of upper-outer quadrant of right breast in female, estrogen receptor positive (HCC)  02/22/2023 Mammogram   Screening mammogram on February 22, 2023 showed possible mass in the right breast with calcifications warranting further evaluation.  No findings suspicious for malignancy in the left breast.  Diagnostic mammogram done confirmed a persistent irregular mass within the central right breast, adjacent to the mass are slightly heterogeneous calcifications.  Ultrasound showed 1.1 x 1.2 x 1.3 cm irregular hypoechoic mass at 10 o'clock position of the right breast 1 cm from the nipple, no abnormal right axillary lymph nodes are noted.   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  Prognostic showed ER 100% positive strong staining PR 95% positive strong staining Ki-67 of 5% and HER2 2+   03/14/2023 Initial Diagnosis   Malignant neoplasm of upper-outer quadrant of right breast in female, estrogen receptor positive (HCC)   03/16/2023 Cancer Staging   Staging form: Breast, AJCC 8th Edition - Clinical stage  from 03/16/2023: Stage IA (cT1c, cN0, cM0, G2, ER+, PR+, HER2-) - Signed by Ronny Bacon, PA-C on 03/16/2023 Stage prefix: Initial diagnosis Histologic grading system: 3 grade system    Ms Ortlieb is here for initial visit by herself.  At baseline she is very healthy, takes several nutritional supplements, is a retired Emergency planning/management officer, had hysterectomy in 2018 otherwise no major medical issues or surgical issues.  Her mother had breast cancer at the age of 33.  She has 1 daughter and her daughter is also currently working in [place].  Rest of the pertinent 10 point ROS reviewed and negative  MEDICAL HISTORY:  Past Medical History:  Diagnosis Date   Anxiety and depression    Elevated LDL cholesterol level    Elevated liver enzymes 07/23/2021   GERD (gastroesophageal reflux disease)    Thyroid disorder 11/14/2017    SURGICAL HISTORY: Past Surgical History:  Procedure Laterality Date   BREAST BIOPSY Right 03/09/2023   [place] RT BREAST BX W LOC DEV 1ST LESION IMG BX SPEC US GUIDE 03/09/2023 [facility]MOGR[facility]   HERNIA REPAIR     LAPAROSCOPIC HYSTERECTOMY  03/2017   Partial, pt still has both adnexa    SOCIAL HISTORY: Social History   Socioeconomic History   Marital status: Divorced    Spouse name: Not on file   Number of children: Not on file   Years of education: Not on file   Highest education level: Master's degree (e.g., MA, MS, MEng, MEd, MSW, MBA)  Occupational History   Not on file  Tobacco Use   Smoking status: Former   Smokeless tobacco: Never   Tobacco comments:    stopped at age 30. smoked for 4-5 years 1  ppd  Vaping Use   Vaping status: Never Used  Substance and Sexual Activity   Alcohol use: Yes    Comment: WINE OCC   Drug use: Never   Sexual activity: Not Currently    Partners: Male    Birth control/protection: Surgical    Comment: hysterectomy  Other Topics Concern   Not on file  Social History Narrative   Not on file   Social Determinants of  Health   Financial Resource Strain: Low Risk  (01/24/2023)   Overall Financial Resource Strain (CARDIA)    Difficulty of Paying Living Expenses: Not very hard  Food Insecurity: No Food Insecurity (01/24/2023)   Hunger Vital Sign    Worried About Running Out of Food in the Last Year: Never true    Ran Out of Food in the Last Year: Never true  Transportation Needs: No Transportation Needs (01/24/2023)   PRAPARE - Administrator, Civil Service (Medical): No    Lack of Transportation (Non-Medical): No  Physical Activity: Sufficiently Active (01/24/2023)   Exercise Vital Sign    Days of Exercise per Week: 3 days    Minutes of Exercise per Session: 120 min  Stress: Stress Concern Present (01/24/2023)   [employer] of Occupational Health - Occupational Stress Questionnaire    Feeling of Stress : To some extent  Social Connections: Unknown (01/24/2023)   Social Connection and Isolation Panel [NHANES]    Frequency of Communication with Friends and Family: More than three times a week    Frequency of Social Gatherings with Friends and Family: Twice a week    Attends Religious Services: Patient declined    Database administrator or Organizations: No    Attends Engineer, structural: Not on file    Marital Status: Divorced  Intimate Partner Violence: Not At Risk (04/22/2022)   [facility]    Threatened: Not on file    Insulted: Not on file    Physically Hurt : Not on file    Scream: Not on file    FAMILY HISTORY: Family History  Problem Relation Age of Onset   Breast cancer Mother 38   Diabetes Mother    Hypertension Mother    Cancer Father        liver?   Diabetes Brother    Breast cancer Paternal Grandmother     ALLERGIES:  is allergic to wellbutrin [bupropion].  MEDICATIONS:  Current Outpatient Medications  Medication Sig Dispense Refill   ALPHA LIPOIC ACID PO Take 300 mg by mouth daily.     Biotin 5000 MCG  CAPS Take 1 capsule by mouth daily.     EVENING PRIMROSE OIL PO Take by mouth.     Flaxseed, Linseed, (FLAX SEED OIL PO) Take by mouth as needed.     glucosamine-chondroitin 500-400 MG tablet Take 1 tablet by mouth 3 (three) times daily.     MAGNESIUM GLYCINATE PO Take 350 mg by mouth daily.     Methylsulfonylmethane (MSM) 1000 MG TABS Take 1 tablet by mouth. Takes three times a week     pyridoxine (B-6) 100 MG tablet Take 100 mg by mouth daily.     vitamin B-12 (CYANOCOBALAMIN) 500 MCG tablet Take 500 mcg by mouth daily.     vitamin C (ASCORBIC ACID) 250 MG tablet Take 250 mg by mouth daily.     VITAMIN D PO Take by mouth.     ASHWAGANDHA PO Take by mouth.  azelastine (ASTELIN) 0.1 % nasal spray Place 1 spray into both nostrils 2 (two) times daily as needed. Use in each nostril as directed 30 mL 5   cetirizine (ZYRTEC ALLERGY) 10 MG tablet Take 1 tablet (10 mg total) by mouth daily as needed for allergies. 30 tablet 5   cycloSPORINE (RESTASIS) 0.05 % ophthalmic emulsion Place 1 drop into both eyes 2 (two) times daily.     famotidine (PEPCID) 20 MG tablet Take 1 tablet (20 mg total) by mouth 2 (two) times daily as needed for heartburn. 60 tablet 5   MAGNESIUM CITRATE PO Take by mouth.     Omega-3 Fatty Acids (FISH OIL PO) Take 1 capsule by mouth as needed.     OVER THE COUNTER MEDICATION Holy basil -   5 times a week     Probiotic Product (PROBIOTIC-10 PO) Take 1 tablet by mouth. Takes 3 times per week     No current facility-administered medications for this visit.    REVIEW OF SYSTEMS:   Constitutional: Denies fevers, chills or abnormal night sweats Eyes: Denies blurriness of vision, double vision or watery eyes Ears, nose, mouth, throat, and face: Denies mucositis or sore throat Respiratory: Denies cough, dyspnea or wheezes Cardiovascular: Denies palpitation, chest discomfort or lower extremity swelling Gastrointestinal:  Denies nausea, heartburn or change in bowel habits Skin:  Denies abnormal skin rashes Lymphatics: Denies new lymphadenopathy or easy bruising Neurological:Denies numbness, tingling or new weaknesses Behavioral/Psych: Mood is stable, no new changes  Breast: Denies any palpable lumps or discharge All other systems were reviewed with the patient and are negative.  PHYSICAL EXAMINATION: ECOG PERFORMANCE STATUS: 0 - Asymptomatic  Vitals:   03/16/23 0916  BP: (!) 113/58  Pulse: 72  Resp: 16  Temp: (!) 97.5 F (36.4 C)  SpO2: 99%   Filed Weights   03/16/23 0916  Weight: 171 lb 1.6 oz (77.6 kg)    GENERAL:alert, no distress and comfortable Bilateral breasts inspected.  No palpable masses.  No regional adenopathy.  LABORATORY DATA:  I have reviewed the data as listed Lab Results  Component Value Date   WBC 7.0 03/16/2023   HGB 13.6 03/16/2023   HCT 40.3 03/16/2023   MCV 94.2 03/16/2023   PLT 313 03/16/2023   Lab Results  Component Value Date   NA 140 03/16/2023   K 4.2 03/16/2023   CL 108 03/16/2023   CO2 26 03/16/2023    RADIOGRAPHIC STUDIES: I have personally reviewed the radiological reports and agreed with the findings in the report.  ASSESSMENT AND PLAN:  Malignant neoplasm of upper-outer quadrant of right breast in female, estrogen receptor positive (HCC) This is a very pleasant 67 year old postmenopausal female patient with newly diagnosed right breast invasive lobular carcinoma, ER/PR positive, Ki-67 of 5% HER2 2+ FISH negative referred to breast [facility] for additional recommendations.  Given small tumor, strong ER/PR positivity, lobular histology and slow growth rate, she will proceed with upfront surgery followed by Oncotype DX testing.  We have discussed the following details about Oncotype DX.  We have discussed about Oncotype Dx score which is a well validated prognostic scoring system which can predict outcome with endocrine therapy alone and whether chemotherapy reduces recurrence.  Typically in patients with ER  positive cancers that are node negative if the RS score is high typically greater than or equal to 26, chemotherapy is recommended.  In women with intermediate recurrence score younger than 50, there can still be some role for chemotherapy in addition to  endocrine therapy especially if the recurrence score is between 21-25. If chemotherapy is needed, this will precede radiation and then after radiation she will continue on antiestrogen therapy.  We have also discussed about antiestrogen therapy. We have discussed options for antiestrogen therapy today.  We have focused our discussion on aromatase inhibitors today. With regards to aromatase inhibitors, we discussed mechanism of action, adverse effects including but not limited to post menopausal symptoms, arthralgias, myalgias, increased risk of cardiovascular events and bone loss.  She is willing to proceed with anastrozole.  She will return to clinic after surgery to review Oncotype DX results and to discuss any additional recommendations.      All questions were answered. The patient knows to call the clinic with any problems, questions or concerns.    Rachel Moulds, MD 03/16/23

## 2023-03-17 ENCOUNTER — Encounter: Payer: Self-pay | Admitting: *Deleted

## 2023-03-17 ENCOUNTER — Telehealth: Payer: Self-pay | Admitting: *Deleted

## 2023-03-17 NOTE — Telephone Encounter (Signed)
Spoke with patient to follow up from [facility] and assess navigation needs and questions she had about her CEM. She was confused between MRI and CEM and so I explained the procedure and patient verbalized understanding.  Encouraged her to call with any other questions.

## 2023-03-21 ENCOUNTER — Encounter: Payer: Self-pay | Admitting: *Deleted

## 2023-03-21 ENCOUNTER — Ambulatory Visit
Admission: RE | Admit: 2023-03-21 | Discharge: 2023-03-21 | Disposition: A | Discharge status: Home / Self Care | Payer: Medicare Other | Source: Ambulatory Visit | Attending: General Surgery | Admitting: General Surgery

## 2023-03-21 ENCOUNTER — Other Ambulatory Visit: Payer: Self-pay | Admitting: General Surgery

## 2023-03-21 DIAGNOSIS — C50411 Malignant neoplasm of upper-outer quadrant of right female breast: Secondary | ICD-10-CM

## 2023-03-21 MED ORDER — IOPAMIDOL (ISOVUE-370) INJECTION 76%
100.0000 mL | Freq: Once | INTRAVENOUS | Status: AC | PRN
  Administered 2023-03-21: 100 mL via INTRAVENOUS

## 2023-03-22 ENCOUNTER — Telehealth: Payer: Self-pay | Admitting: Hematology and Oncology

## 2023-03-22 ENCOUNTER — Encounter: Payer: Self-pay | Admitting: *Deleted

## 2023-03-22 NOTE — Telephone Encounter (Signed)
 Left patient a vm regarding upcoming appointment

## 2023-03-28 ENCOUNTER — Encounter: Payer: Self-pay | Admitting: Nurse Practitioner

## 2023-03-28 ENCOUNTER — Telehealth: Payer: Self-pay | Admitting: Genetic Counselor

## 2023-03-28 ENCOUNTER — Encounter: Payer: Self-pay | Admitting: Genetic Counselor

## 2023-03-28 DIAGNOSIS — Z1379 Encounter for other screening for genetic and chromosomal anomalies: Secondary | ICD-10-CM | POA: Insufficient documentation

## 2023-03-28 NOTE — Telephone Encounter (Signed)
I contacted Brittany [name] to discuss her genetic testing results. No pathogenic variants were identified in the 71 genes analyzed. Detailed clinic note to follow.  The test report has been scanned into EPIC and is located under the Molecular Pathology section of the Results Review tab.  A portion of the result report is included below for reference.   [name], MS, [facility] Genetic Counselor [name] (P) [phone]

## 2023-03-29 ENCOUNTER — Inpatient Hospital Stay: Admission: RE | Admit: 2023-03-29 | Payer: Medicare Other | Source: Ambulatory Visit

## 2023-03-30 ENCOUNTER — Other Ambulatory Visit: Payer: Self-pay | Admitting: Family

## 2023-03-30 MED ORDER — PAROXETINE HCL 10 MG PO TABS: 10.0000 mg | ORAL_TABLET | Freq: Every day | ORAL | 1 refills | Status: DC

## 2023-04-01 ENCOUNTER — Encounter (HOSPITAL_BASED_OUTPATIENT_CLINIC_OR_DEPARTMENT_OTHER): Payer: Self-pay | Admitting: General Surgery

## 2023-04-01 ENCOUNTER — Other Ambulatory Visit: Payer: Self-pay

## 2023-04-08 ENCOUNTER — Encounter: Payer: Self-pay | Admitting: Genetic Counselor

## 2023-04-08 ENCOUNTER — Ambulatory Visit: Payer: Self-pay | Admitting: Genetic Counselor

## 2023-04-08 DIAGNOSIS — Z1379 Encounter for other screening for genetic and chromosomal anomalies: Secondary | ICD-10-CM

## 2023-04-08 NOTE — Progress Notes (Signed)
HPI:   Brittany [name] was previously seen in the [facility] due to a personal and family history of cancer and concerns regarding a hereditary predisposition to cancer. Please refer to our prior cancer genetics clinic note for more information regarding our discussion, assessment and recommendations, at the time. Brittany [name] recent genetic test results were disclosed to her, as were recommendations warranted by these results. These results and recommendations are discussed in more detail below.  CANCER HISTORY:  Oncology History  Malignant neoplasm of upper-outer quadrant of right breast in female, estrogen receptor positive (HCC)  02/22/2023 Mammogram   Screening mammogram on February 22, 2023 showed possible mass in the right breast with calcifications warranting further evaluation.  No findings suspicious for malignancy in the left breast.  Diagnostic mammogram done confirmed a persistent irregular mass within the central right breast, adjacent to the mass are slightly heterogeneous calcifications.  Ultrasound showed 1.1 x 1.2 x 1.3 cm irregular hypoechoic mass at 10 o'clock position of the right breast 1 cm from the nipple, no abnormal right axillary lymph nodes are noted.   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  Prognostic showed ER 100% positive strong staining PR 95% positive strong staining Ki-67 of 5% and HER2 2+   03/14/2023 Initial Diagnosis   Malignant neoplasm of upper-outer quadrant of right breast in female, estrogen receptor positive (HCC)   03/16/2023 Cancer Staging   Staging form: Breast, AJCC 8th Edition - Clinical stage from 03/16/2023: Stage IA (cT1c, cN0, cM0, G2, ER+, PR+, HER2-) - Signed by Ronny Bacon, PA-C on 03/16/2023 Stage prefix: Initial diagnosis Histologic grading system: 3 grade  system    Genetic Testing   [facility] CancerNext-Expanded Panel+RNA was Negative. Report date is 03/27/2023.   The CancerNext-Expanded gene panel offered by [facility] and includes sequencing, rearrangement, and RNA analysis for the following 71 genes: AIP, ALK, APC, ATM, AXIN2, BAP1, BARD1, BMPR1A, BRCA1, BRCA2, BRIP1, CDC73, CDH1, CDK4, CDKN1B, CDKN2A, CHEK2, CTNNA1, DICER1, FH, FLCN, KIF1B, LZTR1, MAX, MEN1, MET, MLH1, MSH2, MSH3, MSH6, MUTYH, NF1, NF2, NTHL1, PALB2, PHOX2B, PMS2, POT1, PRKAR1A, PTCH1, PTEN, RAD51C, RAD51D, RB1, RET, SDHA, SDHAF2, SDHB, SDHC, SDHD, SMAD4, SMARCA4, SMARCB1, SMARCE1, STK11, SUFU, TMEM127, TP53, TSC1, TSC2, and VHL (sequencing and deletion/duplication); EGFR, EGLN1, HOXB13, KIT, MITF, PDGFRA, POLD1, and POLE (sequencing only); EPCAM and GREM1 (deletion/duplication only).      FAMILY HISTORY:  We obtained a detailed, 4-generation family history.  Significant diagnoses are listed below:      Family History  Problem Relation Age of Onset   Breast cancer Mother 47   Diabetes Mother     Hypertension Mother     Cancer Father 31 - 9        liver?   Diabetes Brother     Cancer Paternal Uncle     Breast cancer Paternal Grandmother 92           Brittany [name]'s mother was diagnosed with breast cancer at age 73, she died at age 64. Her father was diagnosed with cancer, possibly liver cancer, in his 72s, he died at age 34. Her paternal uncle was diagnosed with cancer (unknown type) at an unknown age, he is deceased. Brittany [name] paternal grandmother was diagnosed with breast cancer at age 18, she is deceased. Brittany [name] is unaware of previous family history of genetic testing for  hereditary cancer risks. There is maternal Ashkenazi Jewish ancestry.    GENETIC TEST RESULTS:  The [facility] CancerNext-Expanded Panel found no pathogenic mutations. 4  The CancerNext-Expanded gene panel offered by [facility] and includes sequencing, rearrangement, and RNA  analysis for the following 71 genes: AIP, ALK, APC, ATM, AXIN2, BAP1, BARD1, BMPR1A, BRCA1, BRCA2, BRIP1, CDC73, CDH1, CDK4, CDKN1B, CDKN2A, CHEK2, CTNNA1, DICER1, FH, FLCN, KIF1B, LZTR1, MAX, MEN1, MET, MLH1, MSH2, MSH3, MSH6, MUTYH, NF1, NF2, NTHL1, PALB2, PHOX2B, PMS2, POT1, PRKAR1A, PTCH1, PTEN, RAD51C, RAD51D, RB1, RET, SDHA, SDHAF2, SDHB, SDHC, SDHD, SMAD4, SMARCA4, SMARCB1, SMARCE1, STK11, SUFU, TMEM127, TP53, TSC1, TSC2, and VHL (sequencing and deletion/duplication); EGFR, EGLN1, HOXB13, KIT, MITF, PDGFRA, POLD1, and POLE (sequencing only); EPCAM and GREM1 (deletion/duplication only).   The test report has been scanned into EPIC and is located under the Molecular Pathology section of the Results Review tab.  A portion of the result report is included below for reference. Genetic testing reported out on 03/27/2023.       Even though a pathogenic variant was not identified, possible explanations for the cancer in the family may include: There may be no hereditary risk for cancer in the family. The cancers in Brittany [name] and/or her family may be due to other genetic or environmental factors. There may be a gene mutation in one of these genes that current testing methods cannot detect, but that chance is small. There could be another gene that has not yet been discovered, or that we have not yet tested, that is responsible for the cancer diagnoses in the family.  It is also possible there is a hereditary cause for the cancer in the family that Brittany [name] did not inherit.  Therefore, it is important to remain in touch with cancer genetics in the future so that we can continue to offer Brittany [name] the most up to date genetic testing.   ADDITIONAL GENETIC TESTING:  We discussed with Brittany [name] that her genetic testing was fairly extensive.  If there are genes identified to increase cancer risk that can be analyzed in the future, we would be happy to discuss and coordinate this testing at  that time.     CANCER SCREENING RECOMMENDATIONS:  Brittany [name] test result is considered negative (normal).  This means that we have not identified a hereditary cause for her personal and family history of cancer at this time.   An individual's cancer risk and medical management are not determined by genetic test results alone. Overall cancer risk assessment incorporates additional factors, including personal medical history, family history, and any available genetic information that may result in a personalized plan for cancer prevention and surveillance. Therefore, it is recommended she continue to follow the cancer management and screening guidelines provided by her oncology and primary healthcare provider.  RECOMMENDATIONS FOR FAMILY MEMBERS:   Since she did not inherit a mutation in a cancer predisposition gene included on this panel, her daughter could not have inherited a mutation from her in one of these genes. Individuals in this family might be at some increased risk of developing cancer, over the general population risk, due to the family history of cancer. We recommend women in this family have a yearly mammogram beginning at age 36, or 58 years younger than the earliest onset of cancer, an annual clinical breast exam, and perform monthly breast self-exams.  FOLLOW-UP:  Cancer genetics is a rapidly advancing field and it is possible that new genetic tests will be appropriate for her  and/or her family members in the future. We encouraged her to remain in contact with cancer genetics on an annual basis so we can update her personal and family histories and let her know of advances in cancer genetics that may benefit this family.   Our contact number was provided. Brittany [name] questions were answered to her satisfaction, and she knows she is welcome to call us at anytime with additional questions or concerns.   [name], MS, [facility] Genetic  Counselor [email or web address] (P) [phone]

## 2023-04-10 HISTORY — PX: BREAST LUMPECTOMY: SHX2

## 2023-04-11 NOTE — H&P (Signed)
REFERRING:  Jiles Prows, NP   PROVIDER:  Matthias Hughs, MD   Care Team: Patient Care Team: [name], NP as PCP - General Donell Beers, Daphene Calamity, MD as Consulting Provider (Surgical Oncology) Iruku, Shawn Stall, MD (Hematology and Oncology) Jonna Coup, MD (Radiation Oncology)    MRN: [id number] DOB: 09-24-1955 DATE OF ENCOUNTER: 03/16/2023   Subjective    Chief Complaint: Breast Cancer       History of Present Illness: Brittany [name] is a 67 y.o. female who is seen today as an office consultation at the request of Dr. Al Pimple for evaluation of Breast Cancer .     Patient has a new diagnosis of right breast cancer October 2024.  She presented with a screening detected mass and calcifications.  Diagnostic imaging was performed.  This showed a 1.3 cm mass at 10:00 on the right breast.  A core needle biopsy was performed.  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  This was hormone receptor positive, HER2 negative, and Ki-67 was 5%.   The patient does have a history of breast cancer in her mother and paternal grandmother.  Breast density is C.   Menarche 14 Menopause 45 Parity G1   Work retired   Psychologist, prison and probation services mammogram/ultrasound :[facility] 03/08/2023  ACR Breast Density Category c: The breasts are heterogeneously dense, which may obscure small masses.   FINDINGS: Full field, spot compression and magnification views of the RIGHT breast are performed.   A persistent irregular mass within the central RIGHT breast is identified.   Adjacent to this mass are slightly heterogeneous calcifications, some which layer on the LATERAL view.   Targeted ultrasound is performed, showing a 1.1 x 1.2 x 1.3 cm irregular hypoechoic mass at the 10 o'clock position of the RIGHT breast 1 cm from the nipple.   No abnormal RIGHT axillary lymph nodes are noted.   IMPRESSION: 1.3 cm highly suspicious mass within the UPPER OUTER/central  RIGHT breast. Tissue sampling is recommended. No abnormal appearing RIGHT axillary lymph nodes.   RECOMMENDATION: Ultrasound-guided RIGHT breast biopsy, which will be scheduled.   I have discussed the findings and recommendations with the patient. If applicable, a reminder letter will be sent to the patient regarding the next appointment.   BI-RADS CATEGORY  5: Highly suggestive of malignancy.       Pathology core needle biopsy: 03/09/2023 1. Breast, right, needle core biopsy, UOQ, 10 o'clock, 1cmfn :       INVASIVE MAMMARY CARCINOMA, SEE NOTE       TUBULE FORMATION: SCORE 3       NUCLEAR PLEOMORPHISM: SCORE 2       MITOTIC COUNT: SCORE 1       TOTAL SCORE: 6       OVERALL GRADE: 2       LYMPHOVASCULAR INVASION: NOT IDENTIFIED       CANCER LENGTH: 1.1 CM       CALCIFICATIONS: NOT IDENTIFIED       OTHER FINDINGS: NONE  Immunohistochemistry for E-cadherin is performed to better characterize the invasive carcinoma. The E-cadherin stain is negative consistent with lobular carcinoma.   Receptors: The tumor cells are EQUIVOCAL for Her2 (2+). Her2 by FISH will be performed and the results reported separately.  GROUP 5:   HER2 **NEGATIVE**  Estrogen Receptor:  100%, POSITIVE, STRONG STAINING INTENSITY  Progesterone Receptor:  95%, POSITIVE, STRONG STAINING INTENSITY  Proliferation Marker Ki67:  5%      Review  of Systems: A complete review of systems was obtained from the patient.  I have reviewed this information and discussed as appropriate with the patient.  See HPI as well for other ROS. ROS -positive for occasional floaters in the vision, numbness in her toes, and some anxiety.       Medical History: Past Medical History      Past Medical History:  Diagnosis Date   Anxiety     GERD (gastroesophageal reflux disease)     History of cancer          Problem List     Patient Active Problem List  Diagnosis   Malignant neoplasm of upper-outer quadrant of right breast  in female, estrogen receptor positive (CMS/HHS-HCC)        Past Surgical History       Past Surgical History:  Procedure Laterality Date   HYSTERECTOMY N/A      03/2017        Allergies      Allergies  Allergen Reactions   Bupropion Hives        Medications Ordered Prior to Encounter  No current outpatient medications on file prior to visit.    No current facility-administered medications on file prior to visit.        Family History       Family History  Problem Relation Age of Onset   High blood pressure (Hypertension) Mother     Diabetes Mother     Breast cancer Mother     Skin cancer Father     High blood pressure (Hypertension) Father          Tobacco Use History  Social History        Tobacco Use  Smoking Status Former   Types: Cigarettes  Smokeless Tobacco Never  Tobacco Comments    "45 years ago"        Social History  Social History         Socioeconomic History   Marital status: Single  Tobacco Use   Smoking status: Former      Types: Cigarettes   Smokeless tobacco: Never   Tobacco comments:      "45 years ago"  Vaping Use   Vaping status: Never Used  Substance and Sexual Activity   Alcohol use: Yes      Comment: " 2 x week"   Drug use: Never    Social Drivers of Acupuncturist Strain: Low Risk  (01/24/2023)    Received from [facility]    Overall Financial Resource Strain (CARDIA)     Difficulty of Paying Living Expenses: Not very hard  Food Insecurity: No Food Insecurity (01/24/2023)    Received from [facility]    Hunger Vital Sign     Worried About Running Out of Food in the Last Year: Never true     Ran Out of Food in the Last Year: Never true  Transportation Needs: No Transportation Needs (01/24/2023)    Received from [facility] - Transportation     Lack of Transportation (Medical): No     Lack of Transportation (Non-Medical): No  Physical Activity: Sufficiently Active (01/24/2023)     Received from [facility]    Exercise Vital Sign     Days of Exercise per Week: 3 days     Minutes of Exercise per Session: 120 min  Stress: Stress Concern Present (01/24/2023)    Received from  [place]    [employer] of Occupational [facility] - Occupational Stress Questionnaire     Feeling of Stress : To some extent  Social Connections: Unknown (01/24/2023)    Received from [facility]    Social Connection and Isolation Panel [NHANES]     Frequency of Communication with Friends and Family: More than three times a week     Frequency of Social Gatherings with Friends and Family: Twice a week     Attends Religious Services: Patient declined     Database administrator or Organizations: No     Marital Status: Divorced    Received from [employer] [facility]        Objective:         Vitals:    03/16/23 1128  BP: 113/58  Pulse: 72  Resp: 16  Temp: 36.4 C (97.5 F)  Weight: 77.6 kg (171 lb 1.6 oz)  Height: 162.6 cm (5\' 4" )    Body mass index is 29.37 kg/m.   Gen:  No acute distress.  Well nourished and well groomed.   Neurological: Alert and oriented to person, place, and time. Coordination normal.  Head: Normocephalic and atraumatic.  Eyes: Conjunctivae are normal. Pupils are equal, round, and reactive to light. No scleral icterus.  Neck: Normal range of motion. Neck supple. No tracheal deviation or thyromegaly present.  Cardiovascular: Normal rate, regular rhythm, normal heart sounds and intact distal pulses.  Exam reveals no gallop and no friction rub.  No murmur heard. Breast: Patient has a patient has relatively symmetric breasts.  Mild ptosis.  Faint tenderness upper outer quadrant right breast.  No palpable masses.  No nipple retraction or nipple discharge, no skin dimpling, no contour abnormalities, no lymphadenopathy.  Left breast benign. Respiratory: Effort normal.  No respiratory distress. No chest wall tenderness. Breath sounds normal.  No wheezes, rales or  rhonchi.  GI: Soft. Bowel sounds are normal. The abdomen is soft and nontender.  There is no rebound and no guarding.  Musculoskeletal: Normal range of motion. Extremities are nontender.  Lymphadenopathy: No cervical, preauricular, postauricular or axillary adenopathy is present Skin: Skin is warm and dry. No rash noted. No diaphoresis. No erythema. No pallor. No clubbing, cyanosis, or edema.   Psychiatric: Normal mood and affect. Behavior is normal. Judgment and thought content normal.      Labs 03/16/2023 CBC and c-Met essentially normal   Assessment and Plan:        ICD-10-CM    1. Malignant neoplasm of upper-outer quadrant of right breast in female, estrogen receptor positive (CMS/HHS-HCC)  C50.411      Z17.0         Patient has a new diagnosis of cT1cN0 right breast cancer, hormone positive.  In the lobular nature of the tumor, we are going to get additional imaging.  We will do MRI or contrast-enhanced mammogram, which ever is available first. The patient appears to be a candidate based on current imaging for seed localized lumpectomy and sentinel node biopsy.  Patient is offered genetic testing.  Assuming there are no surprises with genetics or the additional imaging, we will plan to do a seed targeted lumpectomy with sentinel node biopsy.  This will be followed by Oncotype and plus minus chemotherapy, radiation, and antihormonal treatment.   The surgical procedure was described to the patient.  I discussed the incision type and location and that we  will need radiology involved with a seed marker 1 to 2  days preoperatively   We discussed the risks bleeding, infection, damage to other structures, need for further procedures/surgeries.  We discussed the risk of seroma.  The patient was advised if the breast has cancer, we may need to go back to surgery for additional tissue to obtain negative margins or for a lymph node biopsy. The patient was advised that these are the most common  complications, but that others can occur as well. I discussed the risk of alteration in breast contour or size.  I discussed risk of chronic pain.  There are rare instances of heart/lung issues post op as well as blood clots.      They were advised against taking aspirin or other anti-inflammatory agents/blood thinners the week before surgery.     The risks and benefits of the procedure were described to the patient and she wishes to proceed.

## 2023-04-12 ENCOUNTER — Other Ambulatory Visit: Payer: Self-pay | Admitting: General Surgery

## 2023-04-12 ENCOUNTER — Ambulatory Visit
Admission: RE | Admit: 2023-04-12 | Discharge: 2023-04-12 | Disposition: A | Discharge status: Home / Self Care | Payer: Medicare Other | Source: Ambulatory Visit | Attending: General Surgery | Admitting: General Surgery

## 2023-04-12 DIAGNOSIS — Z170 Estrogen receptor positive status [ER+]: Secondary | ICD-10-CM

## 2023-04-12 HISTORY — PX: BREAST BIOPSY: SHX20

## 2023-04-12 NOTE — Progress Notes (Signed)
Gave patient CHG soap with instructions, patient verbalized understanding. Pt given Ensure and instructed to finish drink by 5am on the morning of surgery. Pt verbalized understanding

## 2023-04-13 ENCOUNTER — Other Ambulatory Visit: Payer: Self-pay

## 2023-04-13 ENCOUNTER — Ambulatory Visit (HOSPITAL_BASED_OUTPATIENT_CLINIC_OR_DEPARTMENT_OTHER)
Admission: RE | Admit: 2023-04-13 | Discharge: 2023-04-13 | Disposition: A | Discharge status: Home / Self Care | Payer: Medicare Other | Attending: General Surgery | Admitting: General Surgery

## 2023-04-13 ENCOUNTER — Ambulatory Visit (HOSPITAL_BASED_OUTPATIENT_CLINIC_OR_DEPARTMENT_OTHER): Payer: Medicare Other | Admitting: Anesthesiology

## 2023-04-13 ENCOUNTER — Encounter (HOSPITAL_BASED_OUTPATIENT_CLINIC_OR_DEPARTMENT_OTHER): Payer: Self-pay | Admitting: General Surgery

## 2023-04-13 ENCOUNTER — Encounter (HOSPITAL_BASED_OUTPATIENT_CLINIC_OR_DEPARTMENT_OTHER)
Admission: RE | Disposition: A | Discharge status: Home / Self Care | Payer: Self-pay | Source: Home / Self Care | Attending: General Surgery

## 2023-04-13 ENCOUNTER — Ambulatory Visit
Admission: RE | Admit: 2023-04-13 | Discharge: 2023-04-13 | Disposition: A | Discharge status: Home / Self Care | Payer: Medicare Other | Source: Ambulatory Visit | Attending: General Surgery | Admitting: General Surgery

## 2023-04-13 DIAGNOSIS — Z87891 Personal history of nicotine dependence: Secondary | ICD-10-CM | POA: Insufficient documentation

## 2023-04-13 DIAGNOSIS — C50911 Malignant neoplasm of unspecified site of right female breast: Secondary | ICD-10-CM

## 2023-04-13 DIAGNOSIS — C50411 Malignant neoplasm of upper-outer quadrant of right female breast: Secondary | ICD-10-CM

## 2023-04-13 DIAGNOSIS — Z170 Estrogen receptor positive status [ER+]: Secondary | ICD-10-CM | POA: Insufficient documentation

## 2023-04-13 DIAGNOSIS — Z1721 Progesterone receptor positive status: Secondary | ICD-10-CM | POA: Diagnosis not present

## 2023-04-13 DIAGNOSIS — C773 Secondary and unspecified malignant neoplasm of axilla and upper limb lymph nodes: Secondary | ICD-10-CM | POA: Insufficient documentation

## 2023-04-13 HISTORY — DX: Malignant neoplasm of unspecified site of unspecified female breast: C50.919

## 2023-04-13 HISTORY — PX: BREAST LUMPECTOMY WITH RADIOACTIVE SEED AND SENTINEL LYMPH NODE BIOPSY: SHX6550

## 2023-04-13 SURGERY — BREAST LUMPECTOMY WITH RADIOACTIVE SEED AND SENTINEL LYMPH NODE BIOPSY
Anesthesia: General | Site: Breast | Laterality: Right

## 2023-04-13 MED ORDER — EPHEDRINE SULFATE (PRESSORS) 50 MG/ML IJ SOLN
INTRAMUSCULAR | Status: DC | PRN
  Administered 2023-04-13 (×2): 10 mg via INTRAVENOUS
  Administered 2023-04-13: 5 mg via INTRAVENOUS

## 2023-04-13 MED ORDER — PHENYLEPHRINE HCL-NACL 20-0.9 MG/250ML-% IV SOLN
INTRAVENOUS | Status: DC | PRN
  Administered 2023-04-13: 40 ug via INTRAVENOUS
  Administered 2023-04-13: 80 ug via INTRAVENOUS
  Administered 2023-04-13: 40 ug via INTRAVENOUS
  Administered 2023-04-13: 80 ug via INTRAVENOUS
  Administered 2023-04-13: 40 ug via INTRAVENOUS
  Administered 2023-04-13 (×3): 80 ug via INTRAVENOUS

## 2023-04-13 MED ORDER — ONDANSETRON HCL 4 MG PO TABS: 4.0000 mg | ORAL_TABLET | Freq: Three times a day (TID) | ORAL | 0 refills | Status: DC | PRN

## 2023-04-13 MED ORDER — ONDANSETRON HCL 4 MG/2ML IJ SOLN
INTRAMUSCULAR | Status: DC | PRN
  Administered 2023-04-13: 4 mg via INTRAVENOUS

## 2023-04-13 MED ORDER — ROCURONIUM BROMIDE 10 MG/ML (PF) SYRINGE
PREFILLED_SYRINGE | INTRAVENOUS | Status: AC
  Filled 2023-04-13: qty 10

## 2023-04-13 MED ORDER — MIDAZOLAM HCL 5 MG/5ML IJ SOLN
INTRAMUSCULAR | Status: DC | PRN
  Administered 2023-04-13: 2 mg via INTRAVENOUS

## 2023-04-13 MED ORDER — OXYCODONE HCL 5 MG PO TABS: 5.0000 mg | ORAL_TABLET | Freq: Four times a day (QID) | ORAL | 0 refills | Status: DC | PRN

## 2023-04-13 MED ORDER — SCOPOLAMINE 1 MG/3DAYS TD PT72
MEDICATED_PATCH | TRANSDERMAL | Status: AC
  Filled 2023-04-13: qty 1

## 2023-04-13 MED ORDER — 0.9 % SODIUM CHLORIDE (POUR BTL) OPTIME
TOPICAL | Status: DC | PRN
Start: 2023-04-13 — End: 2023-04-13
  Administered 2023-04-13: 1000 mL

## 2023-04-13 MED ORDER — MAGTRACE LYMPHATIC TRACER
INTRAMUSCULAR | Status: DC | PRN
  Administered 2023-04-13: 2 mL via INTRAMUSCULAR

## 2023-04-13 MED ORDER — ACETAMINOPHEN 500 MG PO TABS
1000.0000 mg | ORAL_TABLET | ORAL | Status: AC
  Administered 2023-04-13: 1000 mg via ORAL

## 2023-04-13 MED ORDER — BUPIVACAINE-EPINEPHRINE (PF) 0.5% -1:200000 IJ SOLN
INTRAMUSCULAR | Status: DC | PRN
  Administered 2023-04-13: 30 mL

## 2023-04-13 MED ORDER — LIDOCAINE 2% (20 MG/ML) 5 ML SYRINGE
INTRAMUSCULAR | Status: AC
Start: 2023-04-13 — End: ?
  Filled 2023-04-13: qty 5

## 2023-04-13 MED ORDER — CHLORHEXIDINE GLUCONATE CLOTH 2 % EX PADS: 6.0000 | MEDICATED_PAD | Freq: Once | CUTANEOUS | Status: DC

## 2023-04-13 MED ORDER — FENTANYL CITRATE (PF) 100 MCG/2ML IJ SOLN
INTRAMUSCULAR | Status: AC
  Filled 2023-04-13: qty 2

## 2023-04-13 MED ORDER — FENTANYL CITRATE (PF) 250 MCG/5ML IJ SOLN
INTRAMUSCULAR | Status: DC | PRN
  Administered 2023-04-13: 25 ug via INTRAVENOUS
  Administered 2023-04-13: 50 ug via INTRAVENOUS

## 2023-04-13 MED ORDER — FENTANYL CITRATE (PF) 100 MCG/2ML IJ SOLN
50.0000 ug | Freq: Once | INTRAMUSCULAR | Status: AC
  Administered 2023-04-13: 50 ug via INTRAVENOUS

## 2023-04-13 MED ORDER — SCOPOLAMINE 1 MG/3DAYS TD PT72
1.0000 | MEDICATED_PATCH | TRANSDERMAL | Status: DC
  Administered 2023-04-13: 1.5 mg via TRANSDERMAL

## 2023-04-13 MED ORDER — LIDOCAINE-EPINEPHRINE (PF) 1 %-1:200000 IJ SOLN
INTRAMUSCULAR | Status: DC | PRN
  Administered 2023-04-13: 20 mL

## 2023-04-13 MED ORDER — DEXAMETHASONE SODIUM PHOSPHATE 10 MG/ML IJ SOLN
INTRAMUSCULAR | Status: DC | PRN
  Administered 2023-04-13: 10 mg via INTRAVENOUS

## 2023-04-13 MED ORDER — DEXAMETHASONE SODIUM PHOSPHATE 10 MG/ML IJ SOLN
INTRAMUSCULAR | Status: AC
  Filled 2023-04-13: qty 1

## 2023-04-13 MED ORDER — MIDAZOLAM HCL 2 MG/2ML IJ SOLN
2.0000 mg | Freq: Once | INTRAMUSCULAR | Status: AC
  Administered 2023-04-13: 2 mg via INTRAVENOUS

## 2023-04-13 MED ORDER — ACETAMINOPHEN 500 MG PO TABS
ORAL_TABLET | ORAL | Status: AC
  Filled 2023-04-13: qty 2

## 2023-04-13 MED ORDER — LACTATED RINGERS IV SOLN: INTRAVENOUS | Status: DC

## 2023-04-13 MED ORDER — EPHEDRINE 5 MG/ML INJ
INTRAVENOUS | Status: AC
  Filled 2023-04-13: qty 5

## 2023-04-13 MED ORDER — MIDAZOLAM HCL 2 MG/2ML IJ SOLN
INTRAMUSCULAR | Status: AC
  Filled 2023-04-13: qty 2

## 2023-04-13 MED ORDER — PROPOFOL 10 MG/ML IV BOLUS
INTRAVENOUS | Status: DC | PRN
  Administered 2023-04-13: 160 mg via INTRAVENOUS
  Administered 2023-04-13: 40 mg via INTRAVENOUS

## 2023-04-13 MED ORDER — CEFAZOLIN SODIUM-DEXTROSE 2-4 GM/100ML-% IV SOLN
2.0000 g | INTRAVENOUS | Status: AC
  Administered 2023-04-13: 2 g via INTRAVENOUS

## 2023-04-13 MED ORDER — PHENYLEPHRINE 80 MCG/ML (10ML) SYRINGE FOR IV PUSH (FOR BLOOD PRESSURE SUPPORT)
PREFILLED_SYRINGE | INTRAVENOUS | Status: AC
  Filled 2023-04-13: qty 10

## 2023-04-13 MED ORDER — LIDOCAINE 2% (20 MG/ML) 5 ML SYRINGE
INTRAMUSCULAR | Status: DC | PRN
  Administered 2023-04-13: 100 mg via INTRAVENOUS

## 2023-04-13 MED ORDER — CEFAZOLIN SODIUM-DEXTROSE 2-4 GM/100ML-% IV SOLN
INTRAVENOUS | Status: AC
  Filled 2023-04-13: qty 100

## 2023-04-13 MED ORDER — ONDANSETRON HCL 4 MG/2ML IJ SOLN
INTRAMUSCULAR | Status: AC
  Filled 2023-04-13: qty 2

## 2023-04-13 SURGICAL SUPPLY — 57 items
BINDER BREAST LRG (GAUZE/BANDAGES/DRESSINGS) IMPLANT
BINDER BREAST MEDIUM (GAUZE/BANDAGES/DRESSINGS) IMPLANT
BINDER BREAST XLRG (GAUZE/BANDAGES/DRESSINGS) IMPLANT
BINDER BREAST XXLRG (GAUZE/BANDAGES/DRESSINGS) IMPLANT
BLADE SURG 10 STRL SS (BLADE) ×1 IMPLANT
BLADE SURG 15 STRL LF DISP TIS (BLADE) ×1 IMPLANT
BNDG COHESIVE 4X5 TAN STRL LF (GAUZE/BANDAGES/DRESSINGS) ×1 IMPLANT
CANISTER SUC SOCK COL 7IN (MISCELLANEOUS) IMPLANT
CANISTER SUCT 1200ML W/VALVE (MISCELLANEOUS) ×1 IMPLANT
CHLORAPREP W/TINT 26 (MISCELLANEOUS) ×1 IMPLANT
CLIP TI LARGE 6 (CLIP) ×1 IMPLANT
CLIP TI MEDIUM 6 (CLIP) ×2 IMPLANT
CLIP TI WIDE RED SMALL 6 (CLIP) IMPLANT
COVER MAYO STAND STRL (DRAPES) ×2 IMPLANT
COVER PROBE CYLINDRICAL 5X96 (MISCELLANEOUS) ×1 IMPLANT
DERMABOND ADVANCED .7 DNX12 (GAUZE/BANDAGES/DRESSINGS) ×1 IMPLANT
DRAPE UTILITY XL STRL (DRAPES) ×1 IMPLANT
ELECT COATED BLADE 2.86 ST (ELECTRODE) ×1 IMPLANT
ELECT REM PT RETURN 9FT ADLT (ELECTROSURGICAL) ×1 IMPLANT
ELECTRODE REM PT RTRN 9FT ADLT (ELECTROSURGICAL) ×1 IMPLANT
GAUZE PAD ABD 8X10 STRL (GAUZE/BANDAGES/DRESSINGS) ×1 IMPLANT
GAUZE SPONGE 4X4 12PLY STRL LF (GAUZE/BANDAGES/DRESSINGS) ×1 IMPLANT
GLOVE BIO SURGEON STRL SZ 6 (GLOVE) ×1 IMPLANT
GLOVE BIOGEL PI IND STRL 6.5 (GLOVE) ×1 IMPLANT
GLOVE BIOGEL PI IND STRL 7.0 (GLOVE) IMPLANT
GLOVE ECLIPSE 6.5 STRL STRAW (GLOVE) IMPLANT
GOWN STRL REUS W/ TWL LRG LVL3 (GOWN DISPOSABLE) ×1 IMPLANT
GOWN STRL REUS W/ TWL XL LVL3 (GOWN DISPOSABLE) ×1 IMPLANT
KIT MARKER MARGIN INK (KITS) ×1 IMPLANT
LIGHT WAVEGUIDE WIDE FLAT (MISCELLANEOUS) IMPLANT
NDL HYPO 25X1 1.5 SAFETY (NEEDLE) ×1 IMPLANT
NDL SAFETY ECLIPSE 18X1.5 (NEEDLE) ×1 IMPLANT
NEEDLE HYPO 25X1 1.5 SAFETY (NEEDLE) ×2 IMPLANT
NS IRRIG 1000ML POUR BTL (IV SOLUTION) ×1 IMPLANT
PACK BASIN DAY SURGERY FS (CUSTOM PROCEDURE TRAY) ×1 IMPLANT
PACK UNIVERSAL I (CUSTOM PROCEDURE TRAY) ×1 IMPLANT
PENCIL SMOKE EVACUATOR (MISCELLANEOUS) ×1 IMPLANT
SLEEVE SCD COMPRESS KNEE MED (STOCKING) ×1 IMPLANT
SPIKE FLUID TRANSFER (MISCELLANEOUS) IMPLANT
SPONGE T-LAP 18X18 ~~LOC~~+RFID (SPONGE) ×2 IMPLANT
STAPLER SKIN PROX WIDE 3.9 (STAPLE) IMPLANT
STOCKINETTE IMPERVIOUS LG (DRAPES) ×1 IMPLANT
STRIP CLOSURE SKIN 1/2X4 (GAUZE/BANDAGES/DRESSINGS) ×1 IMPLANT
SUT ETHILON 2 0 FS 18 (SUTURE) IMPLANT
SUT MNCRL AB 4-0 PS2 18 (SUTURE) ×1 IMPLANT
SUT MON AB 5-0 PS2 18 (SUTURE) IMPLANT
SUT SILK 2 0 SH (SUTURE) IMPLANT
SUT VIC AB 2-0 SH 27XBRD (SUTURE) ×1 IMPLANT
SUT VIC AB 3-0 SH 27X BRD (SUTURE) ×1 IMPLANT
SUT VICRYL 3-0 CR8 SH (SUTURE) ×1 IMPLANT
SYR BULB EAR ULCER 3OZ GRN STR (SYRINGE) ×1 IMPLANT
SYR CONTROL 10ML LL (SYRINGE) ×1 IMPLANT
TOWEL GREEN STERILE FF (TOWEL DISPOSABLE) ×1 IMPLANT
TRACER MAGTRACE VIAL (MISCELLANEOUS) IMPLANT
TRAY FAXITRON CT DISP (TRAY / TRAY PROCEDURE) ×1 IMPLANT
TUBE CONNECTING 20X1/4 (TUBING) ×1 IMPLANT
YANKAUER SUCT BULB TIP NO VENT (SUCTIONS) ×1 IMPLANT

## 2023-04-13 NOTE — Transfer of Care (Signed)
Immediate Anesthesia Transfer of Care Note  Patient: Brittany [name]  Procedure(s) Performed: RIGHT BREAST SEED LOCALIZED LUMPECTOMY WITH SENTINEL NODE BIOPSY (Right: Breast)  Patient Location: PACU  Anesthesia Type:General  Level of Consciousness: drowsy  Airway & Oxygen Therapy: Patient Spontanous Breathing and Patient connected to nasal cannula oxygen  Post-op Assessment: Report given to RN and Post -op Vital signs reviewed and stable  Post vital signs: Reviewed and stable  Last Vitals:  Vitals Value Taken Time  BP    Temp    Pulse    Resp    SpO2      Last Pain:  Vitals:   04/13/23 0743  TempSrc: Temporal  PainSc: 0-No pain      Patients Stated Pain Goal: 3 (04/13/23 0743)  Complications: No notable events documented.

## 2023-04-13 NOTE — Progress Notes (Signed)
Assisted Dr. Hollis with right, pectoralis, ultrasound guided block. Side rails up, monitors on throughout procedure. See vital signs in flow sheet. Tolerated Procedure well. 

## 2023-04-13 NOTE — Discharge Instructions (Addendum)
[facility] Office Phone Number [phone]  BREAST BIOPSY/ PARTIAL MASTECTOMY: POST OP INSTRUCTIONS  Always review your discharge instruction sheet given to you by the facility where your surgery was performed.  IF YOU HAVE DISABILITY OR FAMILY LEAVE FORMS, YOU MUST BRING THEM TO THE OFFICE FOR PROCESSING.  DO NOT GIVE THEM TO YOUR DOCTOR.  Take 2 tylenol (acetominophen) three times a day for 3 days.  If you still have pain, add ibuprofen with food in between if able to take this (if you have kidney issues or stomach issues, do not take ibuprofen).  If both of those are not enough, add the narcotic pain pill.  If you find you are needing a lot of this overnight after surgery, call the next morning for a refill.    Prescriptions will not be filled after 5pm or on week-ends. Take your usually prescribed medications unless otherwise directed You should eat very light the first 24 hours after surgery, such as soup, crackers, pudding, etc.  Resume your normal diet the day after surgery. Most patients will experience some swelling and bruising in the breast.  Ice packs and a good support bra will help.  Swelling and bruising can take several days to resolve.  It is common to experience some constipation if taking pain medication after surgery.  Increasing fluid intake and taking a stool softener will usually help or prevent this problem from occurring.  A mild laxative (Milk of Magnesia or Miralax) should be taken according to package directions if there are no bowel movements after 48 hours. Unless discharge instructions indicate otherwise, you may remove your bandages 48 hours after surgery, and you may shower at that time.  You may have steri-strips (small skin tapes) in place directly over the incision.  These strips should be left on the skin at least for for 7-10 days.    ACTIVITIES:  You may resume regular daily activities (gradually increasing) beginning the next day.  Wearing a  good support bra or sports bra (or the breast binder) minimizes pain and swelling.  You may have sexual intercourse when it is comfortable. No heavy lifting for 1-2 weeks (not over around 10 pounds).  You may drive when you no longer are taking prescription pain medication, you can comfortably wear a seatbelt, and you can safely maneuver your car and apply brakes. RETURN TO WORK:  __________3-14 days depending on job. _______________ Bonita Quin should see your doctor in the office for a follow-up appointment approximately two weeks after your surgery.  Your doctor's nurse will typically make your follow-up appointment when she calls you with your pathology report.  Expect your pathology report 3-4 business days after your surgery.  You may call to check if you do not hear from [place] after three days.   WHEN TO CALL YOUR DOCTOR: Fever over 101.0 Nausea and/or vomiting. Extreme swelling or bruising. Continued bleeding from incision. Increased pain, redness, or drainage from the incision.  The clinic staff is available to answer your questions during regular business hours.  Please don't hesitate to call and ask to speak to one of the nurses for clinical concerns.  If you have a medical emergency, go to the nearest emergency room or call 911.  A surgeon from [facility] is always on call at the hospital.  For further questions, please visit [email or web address]     You may have Tylenol again after 1:50pm today, if needed.   Post Anesthesia Home Care Instructions  Activity: Get plenty  of rest for the remainder of the day. A responsible individual must stay with you for 24 hours following the procedure.  For the next 24 hours, DO NOT: -Drive a car -Advertising copywriter -Drink alcoholic beverages -Take any medication unless instructed by your physician -Make any legal decisions or sign important papers.  Meals: Start with liquid foods such as gelatin or soup. Progress to regular  foods as tolerated. Avoid greasy, spicy, heavy foods. If nausea and/or vomiting occur, drink only clear liquids until the nausea and/or vomiting subsides. Call your physician if vomiting continues.  Special Instructions/Symptoms: Your throat may feel dry or sore from the anesthesia or the breathing tube placed in your throat during surgery. If this causes discomfort, gargle with warm salt water. The discomfort should disappear within 24 hours.  If you had a scopolamine patch placed behind your ear for the management of post- operative nausea and/or vomiting:  1. The medication in the patch is effective for 72 hours, after which it should be removed.  Wrap patch in a tissue and discard in the trash. Wash hands thoroughly with soap and water. 2. You may remove the patch earlier than 72 hours if you experience unpleasant side effects which may include dry mouth, dizziness or visual disturbances. 3. Avoid touching the patch. Wash your hands with soap and water after contact with the patch.

## 2023-04-13 NOTE — Op Note (Signed)
Right Breast Radioactive seed localized lumpectomy and sentinel lymph node biopsy  Indications: This patient presents with history of right breast cancer, cT1cN0 grade 2 invasive mammary carcinoma (lobular phenotype), upper outer quadrant, ER+/PR+/Her2-, Ki 67 5%  Pre-operative Diagnosis: right breast cancer  Post-operative Diagnosis: same  Surgeon: Almond Lint   Anesthesia: General endotracheal anesthesia  ASA Class: 2  Procedure Details  The patient was seen in the Holding Room. The risks, benefits, complications, treatment options, and expected outcomes were discussed with the patient. The possibilities of bleeding, infection, the need for additional procedures, failure to diagnose a condition, and creating a complication requiring transfusion or operation were discussed with the patient. The patient concurred with the proposed plan, giving informed consent.  The site of surgery properly noted/marked. The patient was taken to Operating Room # 8, identified, and the procedure verified as Right Breast Seed localized Lumpectomy with sentinel lymph node biopsy. The right arm, breast, and chest were prepped and draped in standard fashion. A Time Out was held and the above information confirmed. The MagTrace was injected into the subareolar position.  The lumpectomy was performed by creating a lateral circumareolar incision near the previously placed radioactive seed.  Dissection was carried down to around the point of maximum signal intensity. The cautery was used to perform the dissection.  Hemostasis was achieved with cautery.  The specimen was inked with the margin marker paint kit.    Specimen radiography confirmed inclusion of the mammographic lesion, the clip, and the seed.  The background signal in the breast was zero.  Additional margins were taken at the medial, superior, lateral, inferior, and the posterior margin(s). The wound was irrigated and reinspected for hemostasis.  A deeper 2-0 was  used to help bridge the defect in the posterior tissue. The skin was then closed with 3-0 vicryl in layers and 4-0 monocryl subcuticular suture.    Using a Sentimag probe, right axillary sentinel nodes were identified transcutaneously.  An oblique incision was created below the axillary hairline.  Dissection was carried through the clavipectoral fascia.  Three deep level w axillary sentinel nodes were removed.  Counts per second were 2500 at the highest.    The background count was 200 cps.  The wound was irrigated.  Hemostasis was achieved with cautery.  The axillary incision was closed with a 3-0 vicryl deep dermal interrupted sutures and a 4-0 monocryl subcuticular closure.    Sterile dressings were applied. At the end of the operation, all sponge, instrument, and needle counts were correct.  Findings: grossly clear surgical margins and no adenopathy, anterior margin is skin, posterior margin is pectoralis.  Estimated Blood Loss:  min         Specimens: right breast tissue with seed, additional medial, superior, lateral, inferior, and posterior margins, and three right axillary sentinel lymph nodes.             Complications:  None; patient tolerated the procedure well.         Disposition: PACU - hemodynamically stable.         Condition: stable

## 2023-04-13 NOTE — Interval H&P Note (Signed)
History and Physical Interval Note:  04/13/2023 9:29 AM  Brittany [name]  has presented today for surgery, with the diagnosis of RIGHT BREAST CANCER.  The various methods of treatment have been discussed with the patient and family. After consideration of risks, benefits and other options for treatment, the patient has consented to  Procedure(s) with comments: RIGHT BREAST SEED LOCALIZED LUMPECTOMY WITH SENTINEL NODE BIOPSY (Right) - 90 MINUTES GENERAL COMBINED WITH REGIONAL as a surgical intervention.  The patient's history has been reviewed, patient examined, no change in status, stable for surgery.  I have reviewed the patient's chart and labs.  Questions were answered to the patient's satisfaction.     Almond Lint

## 2023-04-13 NOTE — Anesthesia Preprocedure Evaluation (Addendum)
Anesthesia Evaluation  Patient identified by MRN, date of birth, ID band Patient awake    Reviewed: Allergy & Precautions, NPO status , Patient's Chart, lab work & pertinent test results  Airway Mallampati: I  TM Distance: >3 FB Neck ROM: Full    Dental  (+) Teeth Intact   Pulmonary former smoker   breath sounds clear to auscultation       Cardiovascular negative cardio ROS  Rhythm:Regular Rate:Normal     Neuro/Psych  PSYCHIATRIC DISORDERS Anxiety Depression    negative neurological ROS     GI/Hepatic Neg liver ROS,GERD  Medicated,,  Endo/Other  negative endocrine ROS    Renal/GU negative Renal ROS     Musculoskeletal negative musculoskeletal ROS (+)    Abdominal   Peds  Hematology negative hematology ROS (+)   Anesthesia Other Findings   Reproductive/Obstetrics                             Anesthesia Physical Anesthesia Plan  ASA: 2  Anesthesia Plan: General   Post-op Pain Management: Regional block*   Induction: Intravenous  PONV Risk Score and Plan: 4 or greater and Ondansetron, Dexamethasone, Midazolam and Scopolamine patch - Pre-op  Airway Management Planned: LMA  Additional Equipment: None  Intra-op Plan:   Post-operative Plan: Extubation in OR  Informed Consent: I have reviewed the patients History and Physical, chart, labs and discussed the procedure including the risks, benefits and alternatives for the proposed anesthesia with the patient or authorized representative who has indicated his/her understanding and acceptance.     Dental advisory given  Plan Discussed with: CRNA  Anesthesia Plan Comments:        Anesthesia Quick Evaluation

## 2023-04-13 NOTE — Anesthesia Procedure Notes (Signed)
Anesthesia Regional Block: Pectoralis block   Pre-Anesthetic Checklist: , timeout performed,  Correct Patient, Correct Site, Correct Laterality,  Correct Procedure, Correct Position, site marked,  Risks and benefits discussed,  Surgical consent,  Pre-op evaluation,  At surgeon's request and post-op pain management  Laterality: Right  Prep: chloraprep       Needles:  Injection technique: Single-shot  Needle Type: Echogenic Stimulator Needle     Needle Length: 9cm  Needle Gauge: 21     Additional Needles:   Procedures:,,,, ultrasound used (permanent image in chart),,    Narrative:  Start time: 04/13/2023 9:35 AM End time: 04/13/2023 9:40 AM Injection made incrementally with aspirations every 5 mL.  Performed by: Personally  Anesthesiologist: Shelton Silvas, MD  Additional Notes: Discussed risks and benefits of the nerve block in detail, including but not limited vascular injury, permanent nerve damage and infection.   Patient tolerated the procedure well. Local anesthetic introduced in an incremental fashion under minimal resistance after negative aspirations. No paresthesias were elicited. After completion of the procedure, no acute issues were identified and patient continued to be monitored by RN.

## 2023-04-14 ENCOUNTER — Encounter (HOSPITAL_BASED_OUTPATIENT_CLINIC_OR_DEPARTMENT_OTHER): Payer: Self-pay | Admitting: General Surgery

## 2023-04-14 NOTE — Anesthesia Postprocedure Evaluation (Signed)
Anesthesia Post Note  Patient: Brittany [name]  Procedure(s) Performed: RIGHT BREAST SEED LOCALIZED LUMPECTOMY WITH SENTINEL NODE BIOPSY (Right: Breast)     Patient location during evaluation: PACU Anesthesia Type: General Level of consciousness: awake and alert Pain management: pain level controlled Vital Signs Assessment: post-procedure vital signs reviewed and stable Respiratory status: spontaneous breathing, nonlabored ventilation, respiratory function stable and patient connected to nasal cannula oxygen Cardiovascular status: blood pressure returned to baseline and stable Postop Assessment: no apparent nausea or vomiting Anesthetic complications: no   No notable events documented.  Last Vitals:  Vitals:   04/13/23 1200 04/13/23 1216  BP: 106/70 125/78  Pulse: 94 100  Resp: 10 16  Temp:  (!) 36.1 C  SpO2: 96% 96%                Shelton Silvas

## 2023-04-15 LAB — SURGICAL PATHOLOGY

## 2023-04-16 ENCOUNTER — Telehealth: Payer: Self-pay | Admitting: Hematology and Oncology

## 2023-04-16 NOTE — Telephone Encounter (Signed)
Spoke with patient confirming upcoming appointment  

## 2023-04-18 ENCOUNTER — Encounter: Payer: Self-pay | Admitting: *Deleted

## 2023-04-18 ENCOUNTER — Telehealth: Payer: Self-pay | Admitting: General Surgery

## 2023-04-18 ENCOUNTER — Other Ambulatory Visit: Payer: Self-pay | Admitting: General Surgery

## 2023-04-18 NOTE — Telephone Encounter (Signed)
Discussed path with patient. Will plan ALND. Patient has appt with oncology tomorrow to discuss path as well.  If chemo planned I will place port at the same time.

## 2023-04-19 ENCOUNTER — Encounter: Payer: Self-pay | Admitting: Hematology and Oncology

## 2023-04-19 ENCOUNTER — Encounter: Payer: Self-pay | Admitting: *Deleted

## 2023-04-19 ENCOUNTER — Inpatient Hospital Stay: Payer: Medicare Other | Attending: Hematology and Oncology | Admitting: Hematology and Oncology

## 2023-04-19 VITALS — BP 133/80 | HR 70 | Temp 98.0°F | Resp 16

## 2023-04-19 DIAGNOSIS — Z170 Estrogen receptor positive status [ER+]: Secondary | ICD-10-CM | POA: Diagnosis not present

## 2023-04-19 DIAGNOSIS — C50411 Malignant neoplasm of upper-outer quadrant of right female breast: Secondary | ICD-10-CM | POA: Diagnosis not present

## 2023-04-19 DIAGNOSIS — G629 Polyneuropathy, unspecified: Secondary | ICD-10-CM | POA: Diagnosis not present

## 2023-04-19 NOTE — Progress Notes (Signed)
START ON PATHWAY REGIMEN - Breast     Cycles 1 through 4: A cycle is every 14 days:     Doxorubicin      Cyclophosphamide      Pegfilgrastim-xxxx    Cycles 5 through 16: A cycle is every 7 days:     Paclitaxel   **Always confirm dose/schedule in your pharmacy ordering system**  Patient Characteristics: Postoperative without Neoadjuvant Therapy, M0 (Pathologic Staging), Invasive Disease, Adjuvant Therapy, HER2 Negative, ER Positive, Node Positive, Node Positive (4+) Therapeutic Status: Postoperative without Neoadjuvant Therapy, M0 (Pathologic Staging) AJCC Grade: G2 AJCC N Category: pN2a AJCC M Category: cM0 ER Status: Positive (+) AJCC 8 Stage Grouping: IB HER2 Status: Negative (-) Oncotype Dx Recurrence Score: Not Appropriate AJCC T Category: pT1c PR Status: Positive (+) Intent of Therapy: Curative Intent, Discussed with Patient

## 2023-04-19 NOTE — Progress Notes (Signed)
Brittany [name] CONSULT NOTE  Patient Care Team: Brittany Paddy, NP as PCP - General (Nurse Practitioner) Brittany Angelica, RN as Oncology Nurse Navigator Brittany Proud, RN as Oncology Nurse Navigator Brittany Lint, MD as Consulting Physician (General Surgery) Brittany Moulds, MD as Consulting Physician (Hematology and Oncology) Brittany Puffer, MD as Consulting Physician (Radiation Oncology)  CHIEF COMPLAINTS/PURPOSE OF CONSULTATION:  Newly diagnosed breast cancer  HISTORY OF PRESENTING ILLNESS:  Brittany [name] 67 y.o. female is here because of recent diagnosis of right breast cancer  I reviewed her records extensively and collaborated the history with the patient.  SUMMARY OF ONCOLOGIC HISTORY: Oncology History  Malignant neoplasm of upper-outer quadrant of right breast in female, estrogen receptor positive (HCC)  02/22/2023 Mammogram   Screening mammogram on February 22, 2023 showed possible mass in the right breast with calcifications warranting further evaluation.  No findings suspicious for malignancy in the left breast.  Diagnostic mammogram done confirmed a persistent irregular mass within the central right breast, adjacent to the mass are slightly heterogeneous calcifications.  Ultrasound showed 1.1 x 1.2 x 1.3 cm irregular hypoechoic mass at 10 o'clock position of the right breast 1 cm from the nipple, no abnormal right axillary lymph nodes are noted.   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  Prognostic showed ER 100% positive strong staining PR 95% positive strong staining Ki-67 of 5% and HER2 2+   03/14/2023 Initial Diagnosis   Malignant neoplasm of upper-outer quadrant of right breast in female, estrogen receptor positive (HCC)   03/16/2023 Cancer Staging   Staging form: Breast, AJCC 8th Edition - Clinical stage  from 03/16/2023: Stage IA (cT1c, cN0, cM0, G2, ER+, PR+, HER2-) - Signed by Ronny Bacon, PA-C on 03/16/2023 Stage prefix: Initial diagnosis Histologic grading system: 3 grade system    Genetic Testing   [facility] CancerNext-Expanded Panel+RNA was Negative. Report date is 03/27/2023.   The CancerNext-Expanded gene panel offered by [name] and includes sequencing, rearrangement, and RNA analysis for the following 71 genes: AIP, ALK, APC, ATM, AXIN2, BAP1, BARD1, BMPR1A, BRCA1, BRCA2, BRIP1, CDC73, CDH1, CDK4, CDKN1B, CDKN2A, CHEK2, CTNNA1, DICER1, FH, FLCN, KIF1B, LZTR1, MAX, MEN1, MET, MLH1, MSH2, MSH3, MSH6, MUTYH, NF1, NF2, NTHL1, PALB2, PHOX2B, PMS2, POT1, PRKAR1A, PTCH1, PTEN, RAD51C, RAD51D, RB1, RET, SDHA, SDHAF2, SDHB, SDHC, SDHD, SMAD4, SMARCA4, SMARCB1, SMARCE1, STK11, SUFU, TMEM127, TP53, TSC1, TSC2, and VHL (sequencing and deletion/duplication); EGFR, EGLN1, HOXB13, KIT, MITF, PDGFRA, POLD1, and POLE (sequencing only); EPCAM and GREM1 (deletion/duplication only).     Brittany [name] is here for initial visit by herself.  At baseline she is very healthy, takes several nutritional supplements, is a retired Emergency planning/management officer, had hysterectomy in 2018 otherwise no major medical issues or surgical issues.  Her mother had breast cancer at the age of 65.  She has 1 daughter and her daughter is also currently working in [place].    Discussed the use of AI scribe software for clinical note transcription with the patient, who gave verbal consent to proceed.  History of Present Illness    The patient, with a history of breast cancer, presents for a follow-up after recent surgery. She expresses dissatisfaction upon learning that she requires another surgery to clean out remaining cancerous lymph nodes. She reports that four out of five lymph nodes tested positive for cancer, indicating a significant disease  burden. 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 The patient is also concerned about the potential side effects of chemotherapy, including hair loss and constipation. Rest of the pertinent 10 point ROS reviewed and negative  MEDICAL HISTORY:  Past Medical History:  Diagnosis Date   Anxiety and depression    Elevated LDL cholesterol level    Elevated liver enzymes 07/23/2021   GERD (gastroesophageal reflux disease)    Malignant neoplasm of breast (female) (HCC)    Thyroid disorder 11/14/2017    SURGICAL HISTORY: Past Surgical History:  Procedure Laterality Date   BREAST BIOPSY Right 03/09/2023   [place] RT BREAST BX W LOC DEV 1ST LESION IMG BX SPEC US GUIDE 03/09/2023 [facility] MAMMOGRAPHY   BREAST BIOPSY  04/12/2023   [place] RT RADIOACTIVE SEED LOC 04/12/2023 [facility] ULTRASOUND   BREAST LUMPECTOMY WITH RADIOACTIVE SEED AND SENTINEL LYMPH NODE BIOPSY Right 04/13/2023   Procedure: RIGHT BREAST SEED LOCALIZED LUMPECTOMY WITH SENTINEL NODE BIOPSY;  Surgeon: Brittany Lint, MD;  Location: [name];  Service: General;  Laterality: Right;   HERNIA REPAIR     LAPAROSCOPIC HYSTERECTOMY  03/2017   Partial, pt still has both adnexa    SOCIAL HISTORY: Social History   Socioeconomic History   Marital status: Divorced    Spouse name: Not on file   Number of children: Not on file   Years of education: Not on file   Highest education level: Master's degree (e.g., MA, Brittany, MEng, MEd, MSW, MBA)  Occupational History   Not on file  Tobacco Use   Smoking status: Former   Smokeless tobacco: Never   Tobacco comments:    stopped at age 41. smoked for 4-5 years 1 ppd  Vaping Use   Vaping status: Never Used  Substance and Sexual Activity   Alcohol use: Yes    Comment: WINE OCC   Drug use: Never   Sexual activity: Not Currently    Partners: Male    Birth control/protection: Surgical    Comment: hysterectomy  Other Topics Concern   Not on file  Social History Narrative   Not on  file   Social Determinants of Health   Financial Resource Strain: Low Risk  (01/24/2023)   Overall Financial Resource Strain (CARDIA)    Difficulty of Paying Living Expenses: Not very hard  Food Insecurity: No Food Insecurity (03/16/2023)   Hunger Vital Sign    Worried About Running Out of Food in the Last Year: Never true    Ran Out of Food in the Last Year: Never true  Transportation Needs: No Transportation Needs (03/16/2023)   PRAPARE - Administrator, Civil Service (Medical): No    Lack of Transportation (Non-Medical): No  Physical Activity: Sufficiently Active (01/24/2023)   Exercise Vital Sign    Days of Exercise per Week: 3 days    Minutes of Exercise per Session: 120 min  Stress: Stress Concern Present (01/24/2023)   [employer] of Occupational Health - Occupational Stress Questionnaire    Feeling of Stress : To some extent  Social Connections: Unknown (01/24/2023)   Social Connection and Isolation Panel [NHANES]    Frequency of Communication with Friends and Family: More than three times a week    Frequency of Social Gatherings with Friends and Family: Twice a week    Attends Religious Services: Patient declined    Database administrator or Organizations: No  Attends Banker Meetings: Not on file    Marital Status: Divorced  Intimate Partner Violence: Not At Risk (03/16/2023)   Humiliation, Afraid, Rape, and Kick questionnaire    Fear of Current or Ex-Partner: No    Emotionally Abused: No    Physically Abused: No    Sexually Abused: No    FAMILY HISTORY: Family History  Problem Relation Age of Onset   Breast cancer Mother 68   Diabetes Mother    Hypertension Mother    Cancer Father 90 - 75       liver?   Diabetes Brother    Cancer Paternal Uncle    Breast cancer Paternal Grandmother 66    ALLERGIES:  is allergic to wellbutrin [bupropion].  MEDICATIONS:  Current Outpatient Medications  Medication Sig Dispense Refill   ALPHA  LIPOIC ACID PO Take 300 mg by mouth daily.     ASHWAGANDHA PO Take by mouth.     Biotin 5000 MCG CAPS Take 1 capsule by mouth daily.     cetirizine (ZYRTEC ALLERGY) 10 MG tablet Take 1 tablet (10 mg total) by mouth daily as needed for allergies. 30 tablet 5   cycloSPORINE (RESTASIS) 0.05 % ophthalmic emulsion Place 1 drop into both eyes 2 (two) times daily.     EVENING PRIMROSE OIL PO Take by mouth.     famotidine (PEPCID) 20 MG tablet Take 1 tablet (20 mg total) by mouth 2 (two) times daily as needed for heartburn. 60 tablet 5   Flaxseed, Linseed, (FLAX SEED OIL PO) Take by mouth as needed.     glucosamine-chondroitin 500-400 MG tablet Take 1 tablet by mouth 3 (three) times daily.     MAGNESIUM CITRATE PO Take by mouth.     MAGNESIUM GLYCINATE PO Take 350 mg by mouth daily.     Methylsulfonylmethane (MSM) 1000 MG TABS Take 1 tablet by mouth. Takes three times a week     Omega-3 Fatty Acids (FISH OIL PO) Take 1 capsule by mouth as needed.     ondansetron (ZOFRAN) 4 MG tablet Take 1 tablet (4 mg total) by mouth every 8 (eight) hours as needed for nausea or vomiting. 20 tablet 0   OVER THE COUNTER MEDICATION Holy basil -   5 times a week     oxyCODONE (OXY IR/ROXICODONE) 5 MG immediate release tablet Take 1 tablet (5 mg total) by mouth every 6 (six) hours as needed for severe pain (pain score 7-10). 8 tablet 0   PARoxetine (PAXIL) 10 MG tablet Take 1 tablet (10 mg total) by mouth daily. 30 tablet 1   Probiotic Product (PROBIOTIC-10 PO) Take 1 tablet by mouth. Takes 3 times per week     pyridoxine (B-6) 100 MG tablet Take 100 mg by mouth daily.     vitamin B-12 (CYANOCOBALAMIN) 500 MCG tablet Take 500 mcg by mouth daily.     vitamin C (ASCORBIC ACID) 250 MG tablet Take 250 mg by mouth daily.     VITAMIN D PO Take by mouth.     No current facility-administered medications for this visit.    REVIEW OF SYSTEMS:   Constitutional: Denies fevers, chills or abnormal night sweats Eyes: Denies  blurriness of vision, double vision or watery eyes Ears, nose, mouth, throat, and face: Denies mucositis or sore throat Respiratory: Denies cough, dyspnea or wheezes Cardiovascular: Denies palpitation, chest discomfort or lower extremity swelling Gastrointestinal:  Denies nausea, heartburn or change in bowel habits Skin: Denies abnormal skin rashes Lymphatics:  Denies new lymphadenopathy or easy bruising Neurological:Denies numbness, tingling or new weaknesses Behavioral/Psych: Mood is stable, no new changes  Breast: Denies any palpable lumps or discharge All other systems were reviewed with the patient and are negative.  PHYSICAL EXAMINATION: ECOG PERFORMANCE STATUS: 0 - Asymptomatic  Vitals:   04/19/23 1201  BP: 133/80  Pulse: 70  Resp: 16  Temp: 98 F (36.7 C)  SpO2: 93%   There were no vitals filed for this visit.   GENERAL:alert, no distress and comfortable   LABORATORY DATA:  I have reviewed the data as listed Lab Results  Component Value Date   WBC 7.0 03/16/2023   HGB 13.6 03/16/2023   HCT 40.3 03/16/2023   MCV 94.2 03/16/2023   PLT 313 03/16/2023   Lab Results  Component Value Date   NA 140 03/16/2023   K 4.2 03/16/2023   CL 108 03/16/2023   CO2 26 03/16/2023    RADIOGRAPHIC STUDIES: I have personally reviewed the radiological reports and agreed with the findings in the report.  ASSESSMENT AND PLAN:  Malignant neoplasm of upper-outer quadrant of right breast in female, estrogen receptor positive (HCC) This is a very pleasant 67 year old postmenopausal female patient with newly diagnosed right breast invasive lobular carcinoma, ER/PR positive, Ki-67 of 5% HER2 2+ FISH negative referred to breast [facility] for additional recommendations.  She is here now post surgery  Breast Cancer with Lymph Node Involvement Four out of five lymph nodes positive for macrometastasis. Discussed the pathology report in great detail and the significance of the number of positive  lymph nodes. Oncotype testing not applicable due to more than three positive lymph nodes. -Plan for chemotherapy (ACT regimen) due to high risk of developing breast cancer outside the breast. -Insertion of port for chemotherapy administration during upcoming surgery. -Start chemotherapy two weeks post-surgery, provided healing is progressing well. -Discussed the regimen, adverse effects including but not limited to fatigue, nausea, constipation, immunosuppression, cardiac toxicity, neuropathy etc.  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 -Monitor for worsening of neuropathy during chemotherapy, particularly during second half of treatment.  Surgery Upcoming surgery on January 8th for removal of lymph nodes and port insertion. -Post-operative appointment on January 15th to assess healing.  General Health Maintenance -Perform echocardiogram before starting chemotherapy to assess for any underlying heart disease. -Consider use of cold cap during chemotherapy to potentially reduce hair loss. -Expect potential side effects of chemotherapy including fatigue, nausea, constipation, and potential for infection. -Provide growth factor injection during first half of chemotherapy to prevent significant drop in white blood cell count. -Expect hair loss due to chemotherapy. -Expect potential for bone pain due to growth factor injection. -Expect potential for neuropathy due to chemotherapy. -Plan for follow-up appointment on January 15th.      All questions were answered. The patient knows to call the clinic with any problems, questions or concerns.    Brittany Moulds, MD 04/19/23

## 2023-04-19 NOTE — Assessment & Plan Note (Signed)
This is a very pleasant 67 year old postmenopausal female patient with newly diagnosed right breast invasive lobular carcinoma, ER/PR positive, Ki-67 of 5% HER2 2+ FISH negative referred to breast [facility] for additional recommendations.  She is here now post surgery  Breast Cancer with Lymph Node Involvement Four out of five lymph nodes positive for macrometastasis. Discussed the pathology report in great detail and the significance of the number of positive lymph nodes. Oncotype testing not applicable due to more than three positive lymph nodes. -Plan for chemotherapy (ACT regimen) due to high risk of developing breast cancer outside the breast. -Insertion of port for chemotherapy administration during upcoming surgery. -Start chemotherapy two weeks post-surgery, provided healing is progressing well. -Discussed the regimen, adverse effects including but not limited to fatigue, nausea, constipation, immunosuppression, cardiac toxicity, neuropathy etc.  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 -Monitor for worsening of neuropathy during chemotherapy, particularly during second half of treatment.  Surgery Upcoming surgery on January 8th for removal of lymph nodes and port insertion. -Post-operative appointment on January 15th to assess healing.  General Health Maintenance -Perform echocardiogram before starting chemotherapy to assess for any underlying heart disease. -Consider use of cold cap during chemotherapy to potentially reduce hair loss. -Expect potential side effects of chemotherapy including fatigue, nausea, constipation, and potential for infection. -Provide growth factor injection during first half of chemotherapy to prevent significant drop in white blood cell count. -Expect hair loss due to chemotherapy. -Expect potential for bone pain due to growth factor injection. -Expect potential for neuropathy due to chemotherapy. -Plan for follow-up appointment on January 15th.

## 2023-04-20 ENCOUNTER — Other Ambulatory Visit: Payer: Self-pay

## 2023-04-21 ENCOUNTER — Other Ambulatory Visit: Payer: Self-pay | Admitting: *Deleted

## 2023-04-21 DIAGNOSIS — C50411 Malignant neoplasm of upper-outer quadrant of right female breast: Secondary | ICD-10-CM

## 2023-04-26 ENCOUNTER — Ambulatory Visit (HOSPITAL_COMMUNITY): Payer: Medicare Other | Attending: Cardiology

## 2023-04-26 DIAGNOSIS — I361 Nonrheumatic tricuspid (valve) insufficiency: Secondary | ICD-10-CM

## 2023-04-26 DIAGNOSIS — Z170 Estrogen receptor positive status [ER+]: Secondary | ICD-10-CM | POA: Diagnosis not present

## 2023-04-26 DIAGNOSIS — C50411 Malignant neoplasm of upper-outer quadrant of right female breast: Secondary | ICD-10-CM | POA: Insufficient documentation

## 2023-04-27 LAB — ECHOCARDIOGRAM COMPLETE
Area-P 1/2: 3.91 cm2
S' Lateral: 2.5 cm

## 2023-05-08 ENCOUNTER — Other Ambulatory Visit: Payer: Self-pay

## 2023-05-09 NOTE — Therapy (Signed)
 OUTPATIENT PHYSICAL THERAPY BREAST CANCER POST OP FOLLOW UP   Patient Name: Brittany [name] MRN: [id number] DOB:[birth year], 67 y.o., female Today's Date: 05/10/2023  END OF SESSION:  PT End of Session - 05/10/23 0957     Visit Number 2    Number of Visits 3    Date for PT Re-Evaluation 07/05/23    PT Start Time 1000    PT Stop Time 1040    PT Time Calculation (min) 40 min    Activity Tolerance Patient tolerated treatment well    Behavior During Therapy [facility] for tasks assessed/performed             Past Medical History:  Diagnosis Date   Anxiety and depression    Elevated LDL cholesterol level    Elevated liver enzymes 07/23/2021   GERD (gastroesophageal reflux disease)    Malignant neoplasm of breast (female) (HCC)    Thyroid  disorder 11/14/2017   Past Surgical History:  Procedure Laterality Date   BREAST BIOPSY Right 03/09/2023   US  RT BREAST BX W LOC DEV 1ST LESION IMG BX SPEC US  GUIDE 03/09/2023 [facility] MAMMOGRAPHY   BREAST BIOPSY  04/12/2023   US  RT RADIOACTIVE SEED LOC 04/12/2023 [facility] ULTRASOUND   BREAST LUMPECTOMY WITH RADIOACTIVE SEED AND SENTINEL LYMPH NODE BIOPSY Right 04/13/2023   Procedure: RIGHT BREAST SEED LOCALIZED LUMPECTOMY WITH SENTINEL NODE BIOPSY;  Surgeon: Aron Shoulders, MD;  Location: [facility];  Service: General;  Laterality: Right;   HERNIA REPAIR     LAPAROSCOPIC HYSTERECTOMY  03/2017   Partial, pt still has both adnexa   Patient Active Problem List   Diagnosis Date Noted   Genetic testing 03/28/2023   Family history of breast cancer 03/16/2023   Malignant neoplasm of upper-outer quadrant of right breast in female, estrogen receptor positive (HCC) 03/14/2023   Arthralgia of right knee 01/28/2023   Pneumococcal vaccination administered at current visit 01/28/2023   Decreased GFR 12/25/2021   Encounter for general adult medical examination with abnormal findings 07/23/2021   Overweight (BMI 25.0-29.9) 07/23/2021    Hyperlipidemia 07/23/2021   Prediabetes 07/23/2021   Ceruminosis, bilateral 02/18/2021   Vitamin D  deficiency 04/01/2020   Elevated LDL cholesterol level    Anxiety and depression 11/14/2017   Abnormal mammogram with microcalcification 11/14/2017   Thyroid  disorder 11/14/2017     REFERRING PROVIDER: Dr. Shoulders Aron  REFERRING DIAG: Right Breast Cancer  THERAPY DIAG:  Malignant neoplasm of upper-outer quadrant of right breast in female, estrogen receptor positive (HCC)  Abnormal posture  Rationale for Evaluation and Treatment: Rehabilitation  ONSET DATE: 02/22/2023  SUBJECTIVE:  SUBJECTIVE STATEMENT: Incisions are healing pretty well but they area a little itchy. I had a little rash but it is pretty well cleared up. Swelling has gone down, but I am still a little bruised. She started me on gabapentin for sensitivity at medial arm and it is improving. Pt is due to have all LN's removed on 05/17/2022 and will start chemo 2 weeks later. She will have a drain and will return for follow up here in 4 weeks.  PERTINENT HISTORY:  Patient was diagnosed on 02/22/2023 with right grade 2 invasive ductal carcinoma breast cancer. It measures 1.3 cm and is located in the upper outer quadrant. It is ER/PR positive and HER2 negative with a Ki67 of 5% . She is now s/p Right lumpectomy with SLNB and 4+/5 LN's on 04/13/2023. She is scheduled for an ALND on 05/17/2022, followed by chemotherapy  PATIENT GOALS:  Reassess how my recovery is going related to arm function, pain, and swelling.  PAIN:  Are you having pain? Yes: NPRS scale: .5 Pain location: upper medial arm Pain description: sharp at times, achy Aggravating factors: certain movements Relieving factors: gabapentin  PRECAUTIONS: Recent Surgery, right UE  Lymphedema risk,   RED FLAGS: None   ACTIVITY LEVEL / LEISURE: cooking, cleaning, walking   OBJECTIVE:   PATIENT SURVEYS:  QUICK DASH: 9.09  OBSERVATIONS: Visible cording right axilla right axilla to wrist with arm abducted not limiting pt motion, incisions healed; thickening/scar tissue noted under each incision Mild swelling right axillary region. Foam pad in TG soft made to place in sports bra which comes higher than her compression bra  POSTURE:  Forward head, rounded shoulders  LYMPHEDEMA ASSESSMENT:  UPPER EXTREMITY AROM/PROM:   A/PROM RIGHT   eval   RIGHT 05/10/2023  Shoulder extension 44 60  Shoulder flexion 168 174  Shoulder abduction 166 173  Shoulder internal rotation 72 70  Shoulder external rotation 74 98                          (Blank rows = not tested)   A/PROM LEFT   eval  Shoulder extension 52  Shoulder flexion 164  Shoulder abduction 172  Shoulder internal rotation 72  Shoulder external rotation 82                          (Blank rows = not tested)   CERVICAL AROM: All within normal limits   UPPER EXTREMITY STRENGTH: WNL   LYMPHEDEMA ASSESSMENTS (in cm):    LANDMARK RIGHT   eval RIGHT  10 cm proximal to olecranon process 30.3 29.7  Olecranon process 24.5 24.6  10 cm proximal to ulnar styloid process 23.2 23.4  Just proximal to ulnar styloid process 15.7 15.7  Across hand at thumb web space 19.2 20.3  At base of 2nd digit 6.4 6.4  (Blank rows = not tested)   LANDMARK LEFT   eval  10 cm proximal to olecranon process 31.4  Olecranon process 24.4  10 cm proximal to ulnar styloid process 22.3  Just proximal to ulnar styloid process 15.3  Across hand at thumb web space 18.2  At base of 2nd digit 6.1  (Blank rows = not tested)    Surgery type/Date: 04/13/2023 Right Lumpectomy and SLNB, scheduled for ALND on 05/17/2022 Number of lymph nodes removed: 4+/5 Current/past treatment (chemo, radiation, hormone therapy): Chemotherapy to start  2 weeks post surgery Other symptoms:  Heaviness/tightness No  Pain Yes, mild Pitting edema No Infections No Decreased scar mobility Yes Stemmer sign No  PATIENT EDUCATION:  Education details: Start exercises after drain removed from next surgery,ABC class, Axillary Web syndrome, SOZO screens, ABC class (set up after next surgery Person educated: Patient Education method: Explanation and Handouts Education comprehension: verbalized understanding  HOME EXERCISE PROGRAM: Reviewed previously given post op HEP. Showed NTS for cording  ASSESSMENT:  CLINICAL IMPRESSION: Pt is s/p Right lumpectomy on 04/13/2023 with SLNB and 4+/5 LN. She is scheduled for an ALND on 05/18/2023 followed by chemotherapy. She is making excellent progress despite cording which is visible from axilla to wrist. It is not really painful and it is not limiting her excellent ROM. Incisions are healing and there is just mild swelling in the axillary region for which she is wearing a sports bra because it comes up higher. A foam pad was made to place in her bra to help with comfort/swelling. She is due to have an ALND on 05/17/2022, and will have a drain. She knows not to raise arms above shoulder height until the drain is removed, and then she may begin post op exercises. She was educated in scar massage to incisions. She will benefit from another skilled therapy visit to be reassessed after her next surgery.  Pt will benefit from skilled therapeutic intervention to improve on the following deficits: Decreased knowledge of precautions, impaired UE functional use, pain, decreased ROM, postural dysfunction.   PT treatment/interventions: ADL/Self care home management, [phone]- PT Re-evaluation, 97110-Therapeutic exercises, 97530- Therapeutic activity, [id number]- Neuromuscular re-education, 97535- Self Care, and 02859- Manual therapy   GOALS: Goals reviewed with patient? Yes  LONG TERM GOALS:  (STG=LTG)  GOALS Name Target Date  Goal  status  1 Pt will demonstrate she has regained full shoulder ROM and function post operatively compared to baselines.  Baseline: 05/10/2023 MET for first surgery 05/10/2023 Carry over for next surgery on 05/18/2023 07/05/2023  2   INITIAL  3   INITIAL  4   INITIAL     PLAN:  PT FREQUENCY/DURATION: 1 additional visit to reassess after next surgery  PLAN FOR NEXT SESSION: Reassess ROM, circumference, Incisions, cording etc. Schedule SOZO, ABC class,    [facility]  [address], [address]  [place] [address]  [phone]  After Breast Cancer Class It is recommended you attend the ABC class to be educated on lymphedema risk reduction. This class is free of charge and lasts for 1 hour. It is a 1-time class. You will need to download the TEAMS app either on your phone or computer. We will send you a link the night before or the morning of the class. You should be able to click on that link to join the class. This is not a confidential class. You don't have to turn your camera on, but other participants may be able to see your email address.  Scar massage You can begin gentle scar massage to you incision sites. Gently place one hand on the incision and move the skin (without sliding on the skin) in various directions. Do this for a few minutes and then you can gently massage either coconut oil or vitamin E cream into the scars.  Compression garment You should continue wearing your compression bra until you feel like you no longer have swelling.  Home exercise Program Continue doing the exercises you were given until you feel like you can do them without feeling any tightness at the end.  Walking Program Studies show that 30 minutes of walking per day (fast enough to elevate your heart rate) can significantly reduce the risk of a cancer recurrence. If you can't walk due to other medical reasons, we encourage you to find another activity you could do (like a  stationary bike or water exercise).  Posture After breast cancer surgery, people frequently sit with rounded shoulders posture because it puts their incisions on slack and feels better. If you sit like this and scar tissue forms in that position, you can become very tight and have pain sitting or standing with good posture. Try to be aware of your posture and sit and stand up tall to heal properly.  Follow up PT: It is recommended you return every 3 months for the first 3 years following surgery to be assessed on the SOZO machine for an L-Dex score. This helps prevent clinically significant lymphedema in 95% of patients. These follow up screens are 10 minute appointments that you are not billed for.  Grayce JINNY Sheldon, PT 05/10/2023, 10:41 AM

## 2023-05-10 ENCOUNTER — Ambulatory Visit: Payer: Medicare Other

## 2023-05-10 ENCOUNTER — Ambulatory Visit: Payer: Medicare Other | Attending: General Surgery

## 2023-05-10 DIAGNOSIS — C50411 Malignant neoplasm of upper-outer quadrant of right female breast: Secondary | ICD-10-CM | POA: Diagnosis present

## 2023-05-10 DIAGNOSIS — R293 Abnormal posture: Secondary | ICD-10-CM | POA: Diagnosis present

## 2023-05-10 DIAGNOSIS — Z170 Estrogen receptor positive status [ER+]: Secondary | ICD-10-CM

## 2023-05-10 NOTE — Patient Instructions (Signed)
   [facility]  [address], [address]  [place] [address]  [phone] Axillary web syndrome (also called cording) can happen after having breast cancer surgery when lymph nodes in the armpit are removed. It presents as if you have a thin cord in your arm and can run from the armpit all the way down into the forearm. If you've had a sentinel node biopsy, the risk is 1-20% and if you've had an axillary lymph node dissection (more than 7 nodes removed), the risk is 36-72%. The ranges vary depending on the research study.  It most often happens 3-4 weeks post-op but can happen sooner or later. There are several possibilities for what cording actually is. Although no one knows for sure as of yet, it may be related to lymphatics, veins, or other tissue. Sometimes cording resolves on its own but other times it requires physical therapy with a therapist who specializes in lymphedema and/or cancer rehab. Treatment typically involves stretching, manual techniques, and exercise. Sometimes cords get "released" while stretching or during manual treatment and the patient may experience the sensation of a "pop." This may feel strange but it is not dangerous and is a sign that the cord has released; range of motion may be improved in the process.  After Breast Cancer Class It is recommended you attend the ABC class to be educated on lymphedema risk reduction. This class is free of charge and lasts for 1 hour. It is a 1-time class. You will need to download the Webex app either on your phone or computer. We will send you a link the night before or the morning of the class. You should be able to click on that link to join the class. This is not a confidential class. You don't have to turn your camera on, but other participants may be able to see your email address.  Scar massage You can begin gentle scar massage to you incision sites. Gently place one hand on the incision and move the skin  (without sliding on the skin) in various directions. Do this for a few minutes and then you can gently massage either coconut oil or vitamin E cream into the scars.  Compression garment You should continue wearing your compression bra until you feel like you no longer have swelling.  Home exercise Program Continue doing the exercises you were given until you feel like you can do them without feeling any tightness at the end.   Walking Program Studies show that 30 minutes of walking per day (fast enough to elevate your heart rate) can significantly reduce the risk of a cancer recurrence. If you can't walk due to other medical reasons, we encourage you to find another activity you could do (like a stationary bike or water exercise).  Posture After breast cancer surgery, people frequently sit with rounded shoulders posture because it puts their incisions on slack and feels better. If you sit like this and scar tissue forms in that position, you can become very tight and have pain sitting or standing with good posture. Try to be aware of your posture and sit and stand up tall to heal properly.  Follow up PT: It is recommended you return every 3 months for the first 2 years following surgery to be assessed on the SOZO machine for an L-Dex score. This helps prevent clinically significant lymphedema in 95% of patients. These follow up screens are 10 minute appointments that you are not billed for.  

## 2023-05-11 ENCOUNTER — Other Ambulatory Visit: Payer: Self-pay

## 2023-05-12 ENCOUNTER — Ambulatory Visit: Payer: Medicare Other | Admitting: Hematology and Oncology

## 2023-05-12 NOTE — Progress Notes (Signed)
 Surgical Instructions   Your procedure is scheduled on Wednesday, January 8th, 2025. Report to [facility] Main Entrance A at 11:00 A.M., then check in with the Admitting office. Any questions or running late day of surgery: call [phone]  Questions prior to your surgery date: call [phone], Monday-Friday, 8am-4pm. If you experience any cold or flu symptoms such as cough, fever, chills, shortness of breath, etc. between now and your scheduled surgery, please notify us  at the above number.     Remember:  Do not eat after midnight the night before your surgery   You may drink clear liquids until 10:00 the morning of your surgery.   Clear liquids allowed are: Water, Non-Citrus Juices (without pulp), Carbonated Beverages, Clear Tea (no milk, honey, etc.), Black Coffee Only (NO MILK, CREAM OR POWDERED CREAMER of any kind), and Gatorade.    Take these medicines the morning of surgery with A SIP OF WATER: Paroxetine  (Paxil ) Cyclosporine (Restasis) eye drop   May take these medicines IF NEEDED: None.     One week prior to surgery, STOP taking any Aspirin (unless otherwise instructed by your surgeon) Aleve, Naproxen, Ibuprofen , Motrin , Advil , Goody's, BC's, all herbal medications, fish oil, and non-prescription vitamins.                     Do NOT Smoke (Tobacco/Vaping) for 24 hours prior to your procedure.  If you use a CPAP at night, you may bring your mask/headgear for your overnight stay.   You will be asked to remove any contacts, glasses, piercing's, hearing aid's, dentures/partials prior to surgery. Please bring cases for these items if needed.    Patients discharged the day of surgery will not be allowed to drive home, and someone needs to stay with them for 24 hours.  SURGICAL WAITING ROOM VISITATION Patients may have no more than 2 support people in the waiting area - these visitors may rotate.   Pre-op nurse will coordinate an appropriate time for 1 ADULT support  person, who may not rotate, to accompany patient in pre-op.  Children under the age of 82 must have an adult with them who is not the patient and must remain in the main waiting area with an adult.  If the patient needs to stay at the hospital during part of their recovery, the visitor guidelines for inpatient rooms apply.  Please refer to the [facility] website for the visitor guidelines for any additional information.   If you received a COVID test during your pre-op visit  it is requested that you wear a mask when out in public, stay away from anyone that may not be feeling well and notify your surgeon if you develop symptoms. If you have been in contact with anyone that has tested positive in the last 10 days please notify you surgeon.      Pre-operative CHG Bathing Instructions   You can play a key role in reducing the risk of infection after surgery. Your skin needs to be as free of germs as possible. You can reduce the number of germs on your skin by washing with CHG (chlorhexidine  gluconate) soap before surgery. CHG is an antiseptic soap that kills germs and continues to kill germs even after washing.   DO NOT use if you have an allergy  to chlorhexidine /CHG or antibacterial soaps. If your skin becomes reddened or irritated, stop using the CHG and notify one of our RNs at [phone].  TAKE A SHOWER THE NIGHT BEFORE SURGERY AND THE DAY OF SURGERY    Please keep in mind the following:  DO NOT shave, including legs and underarms, 48 hours prior to surgery.   You may shave your face before/day of surgery.  Place clean sheets on your bed the night before surgery Use a clean washcloth (not used since being washed) for each shower. DO NOT sleep with pet's night before surgery.  CHG Shower Instructions:  Wash your face and private area with normal soap. If you choose to wash your hair, wash first with your normal shampoo.  After you use shampoo/soap, rinse your hair and  body thoroughly to remove shampoo/soap residue.  Turn the water OFF and apply half the bottle of CHG soap to a CLEAN washcloth.  Apply CHG soap ONLY FROM YOUR NECK DOWN TO YOUR TOES (washing for 3-5 minutes)  DO NOT use CHG soap on face, private areas, open wounds, or sores.  Pay special attention to the area where your surgery is being performed.  If you are having back surgery, having someone wash your back for you may be helpful. Wait 2 minutes after CHG soap is applied, then you may rinse off the CHG soap.  Pat dry with a clean towel  Put on clean pajamas    Additional instructions for the day of surgery: DO NOT APPLY any lotions, deodorants, cologne, or perfumes.   Do not wear jewelry or makeup Do not wear nail polish, gel polish, artificial nails, or any other type of covering on natural nails (fingers and toes) Do not bring valuables to the hospital. [facility] is not responsible for valuables/personal belongings. Put on clean/comfortable clothes.  Please brush your teeth.  Ask your nurse before applying any prescription medications to the skin.

## 2023-05-13 ENCOUNTER — Encounter (HOSPITAL_COMMUNITY): Payer: Self-pay

## 2023-05-13 ENCOUNTER — Other Ambulatory Visit: Payer: Self-pay

## 2023-05-13 ENCOUNTER — Encounter (HOSPITAL_COMMUNITY)
Admission: RE | Admit: 2023-05-13 | Discharge: 2023-05-13 | Disposition: A | Discharge status: Home / Self Care | Payer: Medicare Other | Source: Ambulatory Visit | Attending: General Surgery | Admitting: General Surgery

## 2023-05-13 VITALS — BP 125/86 | HR 67 | Temp 97.7°F | Resp 17 | Ht 64.0 in | Wt 171.1 lb

## 2023-05-13 DIAGNOSIS — Z01812 Encounter for preprocedural laboratory examination: Secondary | ICD-10-CM | POA: Diagnosis not present

## 2023-05-13 DIAGNOSIS — Z01818 Encounter for other preprocedural examination: Secondary | ICD-10-CM | POA: Diagnosis present

## 2023-05-13 LAB — CBC
HCT: 39.7 % (ref 36.0–46.0)
Hemoglobin: 13.1 g/dL (ref 12.0–15.0)
MCH: 31.6 pg (ref 26.0–34.0)
MCHC: 33 g/dL (ref 30.0–36.0)
MCV: 95.7 fL (ref 80.0–100.0)
Platelets: 314 10*3/uL (ref 150–400)
RBC: 4.15 MIL/uL (ref 3.87–5.11)
RDW: 13.2 % (ref 11.5–15.5)
WBC: 7.5 10*3/uL (ref 4.0–10.5)
nRBC: 0 % (ref 0.0–0.2)

## 2023-05-13 NOTE — Progress Notes (Signed)
 PCP - Dr. Lauraine Pereyra NP Cardiologist - Denies  PPM/ICD - Denies Device Orders - N/A Rep Notified - N/A  Chest x-ray - N/A EKG - N/A Stress Test - Denies ECHO - Denies Cardiac Cath -Denies   Sleep Study - Denies CPAP - N/A  Fasting Blood Sugar - Denies Checks Blood Sugar _____ times a day: N/A  Last dose of GLP1 agonist-  Denies GLP1 instructions: N/A  Blood Thinner Instructions: Aspirin Instructions:  ERAS Protcol - Y  PRE-SURGERY Ensure or G2- no drink  COVID TEST- N   Anesthesia review: n  Patient denies shortness of breath, fever, cough and chest pain at PAT appointment. Patient denies any respiratory issues at this time.    All instructions explained to the patient, with a verbal understanding of the material. Patient agrees to go over the instructions while at home for a better understanding. Patient also instructed to self quarantine after being tested for COVID-19. The opportunity to ask questions was provided.

## 2023-05-18 ENCOUNTER — Ambulatory Visit (HOSPITAL_COMMUNITY): Payer: Medicare Other

## 2023-05-18 ENCOUNTER — Other Ambulatory Visit: Payer: Self-pay

## 2023-05-18 ENCOUNTER — Ambulatory Visit (HOSPITAL_COMMUNITY)
Admission: RE | Admit: 2023-05-18 | Discharge: 2023-05-18 | Disposition: A | Discharge status: Home / Self Care | Payer: Medicare Other | Attending: General Surgery | Admitting: General Surgery

## 2023-05-18 ENCOUNTER — Ambulatory Visit (HOSPITAL_COMMUNITY): Payer: Medicare Other | Admitting: Anesthesiology

## 2023-05-18 ENCOUNTER — Encounter (HOSPITAL_COMMUNITY)
Admission: RE | Disposition: A | Discharge status: Home / Self Care | Payer: Self-pay | Source: Home / Self Care | Attending: General Surgery

## 2023-05-18 ENCOUNTER — Encounter (HOSPITAL_COMMUNITY): Payer: Self-pay | Admitting: General Surgery

## 2023-05-18 DIAGNOSIS — C773 Secondary and unspecified malignant neoplasm of axilla and upper limb lymph nodes: Secondary | ICD-10-CM | POA: Insufficient documentation

## 2023-05-18 DIAGNOSIS — Z87891 Personal history of nicotine dependence: Secondary | ICD-10-CM | POA: Diagnosis not present

## 2023-05-18 DIAGNOSIS — C50411 Malignant neoplasm of upper-outer quadrant of right female breast: Secondary | ICD-10-CM | POA: Diagnosis not present

## 2023-05-18 DIAGNOSIS — Z853 Personal history of malignant neoplasm of breast: Secondary | ICD-10-CM | POA: Diagnosis not present

## 2023-05-18 DIAGNOSIS — K219 Gastro-esophageal reflux disease without esophagitis: Secondary | ICD-10-CM | POA: Insufficient documentation

## 2023-05-18 HISTORY — PX: PORTACATH PLACEMENT: SHX2246

## 2023-05-18 HISTORY — PX: AXILLARY LYMPH NODE DISSECTION: SHX5229

## 2023-05-18 SURGERY — LYMPHADENECTOMY, AXILLARY
Anesthesia: General | Laterality: Right

## 2023-05-18 MED ORDER — ACETAMINOPHEN 500 MG PO TABS
1000.0000 mg | ORAL_TABLET | ORAL | Status: AC
  Administered 2023-05-18: 1000 mg via ORAL
  Filled 2023-05-18: qty 2

## 2023-05-18 MED ORDER — HEPARIN SOD (PORK) LOCK FLUSH 100 UNIT/ML IV SOLN
INTRAVENOUS | Status: AC
  Filled 2023-05-18: qty 5

## 2023-05-18 MED ORDER — FENTANYL CITRATE (PF) 250 MCG/5ML IJ SOLN
INTRAMUSCULAR | Status: AC
  Filled 2023-05-18: qty 5

## 2023-05-18 MED ORDER — 0.9 % SODIUM CHLORIDE (POUR BTL) OPTIME
TOPICAL | Status: DC | PRN
  Administered 2023-05-18: 1000 mL

## 2023-05-18 MED ORDER — FENTANYL CITRATE (PF) 100 MCG/2ML IJ SOLN: 100.0000 ug | Freq: Once | INTRAMUSCULAR | Status: AC

## 2023-05-18 MED ORDER — MIDAZOLAM HCL 2 MG/2ML IJ SOLN
2.0000 mg | Freq: Once | INTRAMUSCULAR | Status: AC
Start: 2023-05-18 — End: 2023-05-18

## 2023-05-18 MED ORDER — FENTANYL CITRATE (PF) 100 MCG/2ML IJ SOLN
INTRAMUSCULAR | Status: AC
  Administered 2023-05-18: 100 ug via INTRAVENOUS
  Filled 2023-05-18: qty 2

## 2023-05-18 MED ORDER — LACTATED RINGERS IV SOLN: INTRAVENOUS | Status: DC

## 2023-05-18 MED ORDER — EPHEDRINE SULFATE-NACL 50-0.9 MG/10ML-% IV SOSY
PREFILLED_SYRINGE | INTRAVENOUS | Status: DC | PRN
  Administered 2023-05-18: 10 mg via INTRAVENOUS

## 2023-05-18 MED ORDER — MIDAZOLAM HCL 2 MG/2ML IJ SOLN
INTRAMUSCULAR | Status: AC
  Filled 2023-05-18: qty 2

## 2023-05-18 MED ORDER — LIDOCAINE 2% (20 MG/ML) 5 ML SYRINGE
INTRAMUSCULAR | Status: DC | PRN
  Administered 2023-05-18: 60 mg via INTRAVENOUS

## 2023-05-18 MED ORDER — PHENYLEPHRINE HCL-NACL 20-0.9 MG/250ML-% IV SOLN
INTRAVENOUS | Status: DC | PRN
  Administered 2023-05-18: 50 ug/min via INTRAVENOUS

## 2023-05-18 MED ORDER — GLYCOPYRROLATE PF 0.2 MG/ML IJ SOSY
PREFILLED_SYRINGE | INTRAMUSCULAR | Status: DC | PRN
  Administered 2023-05-18: .2 mg via INTRAVENOUS

## 2023-05-18 MED ORDER — FENTANYL CITRATE (PF) 250 MCG/5ML IJ SOLN
INTRAMUSCULAR | Status: DC | PRN
  Administered 2023-05-18: 50 ug via INTRAVENOUS

## 2023-05-18 MED ORDER — PHENYLEPHRINE 80 MCG/ML (10ML) SYRINGE FOR IV PUSH (FOR BLOOD PRESSURE SUPPORT)
PREFILLED_SYRINGE | INTRAVENOUS | Status: DC | PRN
  Administered 2023-05-18 (×3): 80 ug via INTRAVENOUS

## 2023-05-18 MED ORDER — DEXAMETHASONE SODIUM PHOSPHATE 10 MG/ML IJ SOLN
INTRAMUSCULAR | Status: DC | PRN
  Administered 2023-05-18: 10 mg via INTRAVENOUS

## 2023-05-18 MED ORDER — PROPOFOL 10 MG/ML IV BOLUS
INTRAVENOUS | Status: AC
  Filled 2023-05-18: qty 20

## 2023-05-18 MED ORDER — CHLORHEXIDINE GLUCONATE CLOTH 2 % EX PADS: 6.0000 | MEDICATED_PAD | Freq: Once | CUTANEOUS | Status: DC

## 2023-05-18 MED ORDER — BUPIVACAINE-EPINEPHRINE (PF) 0.25% -1:200000 IJ SOLN
INTRAMUSCULAR | Status: AC
  Filled 2023-05-18: qty 30

## 2023-05-18 MED ORDER — ONDANSETRON HCL 4 MG/2ML IJ SOLN
INTRAMUSCULAR | Status: DC | PRN
  Administered 2023-05-18: 4 mg via INTRAVENOUS

## 2023-05-18 MED ORDER — SODIUM CHLORIDE 0.9 % IV SOLN: INTRAVENOUS | Status: DC | PRN

## 2023-05-18 MED ORDER — PROPOFOL 10 MG/ML IV BOLUS
INTRAVENOUS | Status: DC | PRN
  Administered 2023-05-18: 150 mg via INTRAVENOUS

## 2023-05-18 MED ORDER — CHLORHEXIDINE GLUCONATE 0.12 % MT SOLN
15.0000 mL | Freq: Once | OROMUCOSAL | Status: AC
  Administered 2023-05-18: 15 mL via OROMUCOSAL
  Filled 2023-05-18: qty 15

## 2023-05-18 MED ORDER — LIDOCAINE HCL 1 % IJ SOLN
INTRAMUSCULAR | Status: DC | PRN
  Administered 2023-05-18: 15 mL via INTRAMUSCULAR

## 2023-05-18 MED ORDER — METHOCARBAMOL 500 MG PO TABS: 500.0000 mg | ORAL_TABLET | Freq: Three times a day (TID) | ORAL | 2 refills | Status: DC | PRN

## 2023-05-18 MED ORDER — BUPIVACAINE-EPINEPHRINE (PF) 0.5% -1:200000 IJ SOLN
INTRAMUSCULAR | Status: DC | PRN
  Administered 2023-05-18: 20 mL via PERINEURAL

## 2023-05-18 MED ORDER — ORAL CARE MOUTH RINSE: 15.0000 mL | Freq: Once | OROMUCOSAL | Status: AC

## 2023-05-18 MED ORDER — LIDOCAINE HCL 1 % IJ SOLN
INTRAMUSCULAR | Status: AC
  Filled 2023-05-18: qty 20

## 2023-05-18 MED ORDER — HYDROMORPHONE HCL 1 MG/ML IJ SOLN: 0.2500 mg | INTRAMUSCULAR | Status: DC | PRN

## 2023-05-18 MED ORDER — DROPERIDOL 2.5 MG/ML IJ SOLN
INTRAMUSCULAR | Status: AC
  Filled 2023-05-18: qty 2

## 2023-05-18 MED ORDER — DROPERIDOL 2.5 MG/ML IJ SOLN
0.6250 mg | Freq: Once | INTRAMUSCULAR | Status: AC
  Administered 2023-05-18: 0.625 mg via INTRAVENOUS

## 2023-05-18 MED ORDER — HEPARIN SOD (PORK) LOCK FLUSH 100 UNIT/ML IV SOLN
INTRAVENOUS | Status: DC | PRN
  Administered 2023-05-18: 500 [IU] via INTRAVENOUS

## 2023-05-18 MED ORDER — HEPARIN 6000 UNIT IRRIGATION SOLUTION
Status: DC | PRN
  Administered 2023-05-18: 1

## 2023-05-18 MED ORDER — MIDAZOLAM HCL 2 MG/2ML IJ SOLN
INTRAMUSCULAR | Status: AC
  Administered 2023-05-18: 2 mg via INTRAVENOUS
  Filled 2023-05-18: qty 2

## 2023-05-18 MED ORDER — HEPARIN 6000 UNIT IRRIGATION SOLUTION
Status: AC
  Filled 2023-05-18: qty 500

## 2023-05-18 MED ORDER — OXYCODONE HCL 5 MG PO TABS: 5.0000 mg | ORAL_TABLET | Freq: Four times a day (QID) | ORAL | 0 refills | Status: DC | PRN

## 2023-05-18 MED ORDER — BUPIVACAINE LIPOSOME 1.3 % IJ SUSP
INTRAMUSCULAR | Status: DC | PRN
  Administered 2023-05-18: 10 mL via PERINEURAL

## 2023-05-18 MED ORDER — CEFAZOLIN SODIUM-DEXTROSE 2-4 GM/100ML-% IV SOLN
2.0000 g | INTRAVENOUS | Status: AC
  Administered 2023-05-18: 2 g via INTRAVENOUS
  Filled 2023-05-18: qty 100

## 2023-05-18 SURGICAL SUPPLY — 64 items
BAG COUNTER SPONGE SURGICOUNT (BAG) ×2 IMPLANT
BAG DECANTER FOR FLEXI CONT (MISCELLANEOUS) ×2 IMPLANT
BINDER BREAST LRG (GAUZE/BANDAGES/DRESSINGS) IMPLANT
BNDG COHESIVE 6X5 TAN ST LF (GAUZE/BANDAGES/DRESSINGS) ×2 IMPLANT
CANISTER SUCT 3000ML PPV (MISCELLANEOUS) IMPLANT
CHLORAPREP W/TINT 26 (MISCELLANEOUS) ×2 IMPLANT
CLIP TI MEDIUM 24 (CLIP) IMPLANT
CLIP TI MEDIUM 6 (CLIP) ×2 IMPLANT
CLIP TI WIDE RED SMALL 24 (CLIP) IMPLANT
CNTNR URN SCR LID CUP LEK RST (MISCELLANEOUS) IMPLANT
COVER MAYO STAND STRL (DRAPES) ×2 IMPLANT
COVER PROBE W GEL 5X96 (DRAPES) IMPLANT
COVER SURGICAL LIGHT HANDLE (MISCELLANEOUS) ×2 IMPLANT
COVER TRANSDUCER ULTRASND GEL (DISPOSABLE) IMPLANT
DERMABOND ADVANCED .7 DNX12 (GAUZE/BANDAGES/DRESSINGS) ×2 IMPLANT
DRAIN CHANNEL 19F RND (DRAIN) IMPLANT
DRAPE C-ARM 42X120 X-RAY (DRAPES) ×2 IMPLANT
DRAPE CHEST BREAST 15X10 FENES (DRAPES) ×2 IMPLANT
DRAPE WARM FLUID 44X44 (DRAPES) IMPLANT
DRSG TEGADERM 4X4.75 (GAUZE/BANDAGES/DRESSINGS) IMPLANT
ELECT COATED BLADE 2.86 ST (ELECTRODE) ×2 IMPLANT
ELECT REM PT RETURN 9FT ADLT (ELECTROSURGICAL) ×2 IMPLANT
ELECTRODE REM PT RTRN 9FT ADLT (ELECTROSURGICAL) ×2 IMPLANT
EVACUATOR SILICONE 100CC (DRAIN) IMPLANT
GAUZE 4X4 16PLY ~~LOC~~+RFID DBL (SPONGE) ×2 IMPLANT
GAUZE SPONGE 4X4 12PLY STRL (GAUZE/BANDAGES/DRESSINGS) ×2 IMPLANT
GEL ULTRASOUND 20GR AQUASONIC (MISCELLANEOUS) IMPLANT
GLOVE BIO SURGEON STRL SZ 6 (GLOVE) ×2 IMPLANT
GLOVE INDICATOR 6.5 STRL GRN (GLOVE) ×2 IMPLANT
GOWN STRL REUS W/ TWL LRG LVL3 (GOWN DISPOSABLE) ×2 IMPLANT
GOWN STRL REUS W/ TWL XL LVL3 (GOWN DISPOSABLE) ×4 IMPLANT
KIT BASIN OR (CUSTOM PROCEDURE TRAY) ×2 IMPLANT
KIT PORT POWER 8FR ISP CVUE (Port) IMPLANT
KIT TURNOVER KIT B (KITS) ×2 IMPLANT
LIGHT WAVEGUIDE WIDE FLAT (MISCELLANEOUS) IMPLANT
NDL 22X1.5 STRL (OR ONLY) (MISCELLANEOUS) ×2 IMPLANT
NDL FILTER BLUNT 18X1 1/2 (NEEDLE) IMPLANT
NDL HYPO 25GX1X1/2 BEV (NEEDLE) IMPLANT
NEEDLE 22X1.5 STRL (OR ONLY) (MISCELLANEOUS) ×2 IMPLANT
NEEDLE FILTER BLUNT 18X1 1/2 (NEEDLE) IMPLANT
NEEDLE HYPO 25GX1X1/2 BEV (NEEDLE) IMPLANT
NS IRRIG 1000ML POUR BTL (IV SOLUTION) ×2 IMPLANT
PACK GENERAL/GYN (CUSTOM PROCEDURE TRAY) ×2 IMPLANT
PACK UNIVERSAL I (CUSTOM PROCEDURE TRAY) ×2 IMPLANT
PAD ARMBOARD 7.5X6 YLW CONV (MISCELLANEOUS) ×4 IMPLANT
PENCIL BUTTON HOLSTER BLD 10FT (ELECTRODE) ×2 IMPLANT
PENCIL SMOKE EVACUATOR (MISCELLANEOUS) ×2 IMPLANT
POSITIONER HEAD DONUT 9IN (MISCELLANEOUS) ×2 IMPLANT
SPIKE FLUID TRANSFER (MISCELLANEOUS) ×4 IMPLANT
STOCKINETTE IMPERVIOUS 9X36 MD (GAUZE/BANDAGES/DRESSINGS) IMPLANT
STRIP CLOSURE SKIN 1/2X4 (GAUZE/BANDAGES/DRESSINGS) ×2 IMPLANT
SUT ETHILON 2 0 FS 18 (SUTURE) IMPLANT
SUT MNCRL AB 4-0 PS2 18 (SUTURE) IMPLANT
SUT MON AB 4-0 PC3 18 (SUTURE) ×2 IMPLANT
SUT PROLENE 2 0 SH DA (SUTURE) ×4 IMPLANT
SUT SILK 2 0 PERMA HAND 18 BK (SUTURE) ×2 IMPLANT
SUT VIC AB 3-0 SH 27X BRD (SUTURE) ×2 IMPLANT
SYR 5ML LUER SLIP (SYRINGE) ×2 IMPLANT
SYR CONTROL 10ML LL (SYRINGE) IMPLANT
TOWEL GREEN STERILE (TOWEL DISPOSABLE) ×2 IMPLANT
TOWEL GREEN STERILE FF (TOWEL DISPOSABLE) ×2 IMPLANT
TRAY LAPAROSCOPIC MC (CUSTOM PROCEDURE TRAY) ×2 IMPLANT
TUBE CONNECTING 12X1/4 (SUCTIONS) IMPLANT
YANKAUER SUCT BULB TIP NO VENT (SUCTIONS) IMPLANT

## 2023-05-18 NOTE — Anesthesia Preprocedure Evaluation (Addendum)
 Anesthesia Evaluation  Patient identified by MRN, date of birth, ID band Patient awake    Reviewed: Allergy  & Precautions, H&P , NPO status , Patient's Chart, lab work & pertinent test results  Airway Mallampati: I  TM Distance: >3 FB Neck ROM: Full    Dental no notable dental hx. (+) Teeth Intact, Dental Advisory Given   Pulmonary former smoker   Pulmonary exam normal breath sounds clear to auscultation       Cardiovascular negative cardio ROS  Rhythm:Regular Rate:Normal     Neuro/Psych   Anxiety Depression    negative neurological ROS     GI/Hepatic Neg liver ROS,GERD  Medicated,,  Endo/Other  negative endocrine ROS    Renal/GU negative Renal ROS  negative genitourinary   Musculoskeletal   Abdominal   Peds  Hematology negative hematology ROS (+)   Anesthesia Other Findings   Reproductive/Obstetrics negative OB ROS                             Anesthesia Physical Anesthesia Plan  ASA: 2  Anesthesia Plan: General   Post-op Pain Management: Regional block* and Tylenol  PO (pre-op)*   Induction: Intravenous  PONV Risk Score and Plan: 4 or greater and Ondansetron , Dexamethasone  and Midazolam   Airway Management Planned: LMA  Additional Equipment:   Intra-op Plan:   Post-operative Plan: Extubation in OR  Informed Consent: I have reviewed the patients History and Physical, chart, labs and discussed the procedure including the risks, benefits and alternatives for the proposed anesthesia with the patient or authorized representative who has indicated his/her understanding and acceptance.     Dental advisory given  Plan Discussed with: CRNA  Anesthesia Plan Comments:        Anesthesia Quick Evaluation

## 2023-05-18 NOTE — Anesthesia Procedure Notes (Signed)
 Procedure Name: LMA Insertion Date/Time: 05/18/2023 1:09 PM  Performed by: Lynnetta Darrel Lebron Mickey., CRNAPre-anesthesia Checklist: Patient identified, Emergency Drugs available, Suction available and Patient being monitored Patient Re-evaluated:Patient Re-evaluated prior to induction Oxygen Delivery Method: Circle System Utilized Preoxygenation: Pre-oxygenation with 100% oxygen Induction Type: IV induction Ventilation: Mask ventilation without difficulty LMA: LMA inserted LMA Size: 4.0 Number of attempts: 1 Placement Confirmation: positive ETCO2 Tube secured with: Tape Dental Injury: Teeth and Oropharynx as per pre-operative assessment

## 2023-05-18 NOTE — Discharge Instructions (Addendum)
 [facility] [place] [phone]   POST OP INSTRUCTIONS  Always review your discharge instruction sheet given to you by the facility where your surgery was performed. IF YOU HAVE DISABILITY OR FAMILY LEAVE FORMS, YOU MUST BRING THEM TO THE OFFICE FOR PROCESSING.   DO NOT GIVE THEM TO YOUR DOCTOR. Take 2 tylenol  (acetominophen) three times a day for 3 days.  If you still have pain, add ibuprofen  with food in between if able to take this (if you have kidney issues or stomach issues, do not take ibuprofen ).  There is also a muscle relaxant for muscle spasms.    Use the prior script for gabapentin if needed.  This is helpful for burning type pain and is useful for several weeks to take twice daily.   If both of those are not enough, add the narcotic pain pill.    If you find you are needing a lot of this overnight after surgery, call the next morning for a refill.    Take your usually prescribed medications unless otherwise directed. If you need a refill on your pain medication, please contact your pharmacy.  They will contact our office to request authorization.  Prescriptions will not be filled after 5pm or on week-ends. You should follow a light diet the first few days after arrival home, such as soup and crackers, etc.  Resume your normal diet the day after surgery. Most patients will experience some swelling and bruising on the chest and underarm.  Ice packs will help.  Swelling and bruising can take several days to resolve.  It is common to experience some constipation if taking pain medication after surgery.  Increasing fluid intake and taking a stool softener (such as Colace) will usually help or prevent this problem from occurring.  A mild laxative (Milk of Magnesia or Miralax) should be taken according to package instructions if there are no bowel movements after 48 hours. You may remove the gauze from under the binder and replace as needed.  Keep the drains pinned under the  binder unless you are emptying them.  SHORT showers only are permitted after post op day 3.  DRAINS:  If you have drains in place, it is important to keep a list of the amount of drainage produced each day in your drains.  Before leaving the hospital, you should be instructed on drain care.  Call our office if you have any questions about your drains. ACTIVITIES:  You may resume regular (light) daily activities beginning the next day--such as daily self-care, walking, climbing stairs--gradually increasing activities as tolerated.  You may have sexual intercourse when it is comfortable.  Refrain from any heavy lifting or straining until approved by your doctor. You may drive when you are no longer taking prescription pain medication, you can comfortably wear a seatbelt, and you can safely maneuver your car and apply brakes. RETURN TO WORK:  __________________________________________________________ Brittany [name] should see your doctor in the office for a follow-up appointment approximately 3-5 days after your surgery.  Your doctor's nurse will typically make your follow-up appointment when she calls you with your pathology report.  Expect your pathology report 2-3 business days after your surgery.  You may call to check if you do not hear from us  after three days.   OTHER INSTRUCTIONS: ______________________________________________________________________________________________ ____________________________________________________________________________________________ WHEN TO CALL YOUR DOCTOR: Fever over 101.0 Nausea and/or vomiting Extreme swelling or bruising Continued bleeding from incision. Increased pain, redness, or drainage from the incision. The clinic staff is available to  answer your questions during regular business hours.  Please don't hesitate to call and ask to speak to one of the nurses for clinical concerns.  If you have a medical emergency, go to the nearest emergency room or call 911.  A  surgeon from [facility] is always on call at the hospital. [address], [place], [place]  [address] ? [address] [address], [place], [place]   [address] [phone] ? [phone] ? FAX [phone] Web site: [email or web address]

## 2023-05-18 NOTE — H&P (Signed)
 PROVIDER:  JINA CLAIR NEPHEW, MD Patient Care Team: Elnor Lauraine BRAVO, NP as PCP - General Nephew Jina Clair, MD as Consulting Provider (Surgical Oncology) Iruku, Linnette Lane Dice, MD (Hematology and Oncology) Dewey Norleen Hamilton, MD (Radiation Oncology)   MRN: [id number] DOB: 09-29-1955 DATE OF ENCOUNTER: 04/29/2023 Initial History:   Patient has a new diagnosis of right breast cancer October 2024.  She presented with a screening detected mass and calcifications.  Diagnostic imaging was performed.  This showed a 1.3 cm mass at 10:00 on the right breast.  A core needle biopsy was performed.  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  This was hormone receptor positive, HER2 negative, and Ki-67 was 5%.   The patient does have a history of breast cancer in her mother and paternal grandmother.  Breast density is C.  Menarche 14 Menopause 60 Parity G1   Interval History:    Plan Patient underwent right breast seed localized lumpectomy and sentinel lymph node biopsy April 13, 2023.  Final path was pT1c N2a with 4 of 5 positive lymph nodes.  Margins were negative.      Patient is not having a lot of incisional pain, but is having burning pain on the back of her arm.  She is also having some tightness in her axilla.   Pathology 04/13/23 A. BREAST, RIGHT, LUMPECTOMY: Invasive lobular carcinoma, 1.9 x 1.1 x 1.0 cm, grade II/III Ductal carcinoma in situ: Not identified (lobular carcinoma in situ present) Margins, invasive: <1 mm anterior     Closest, invasive: Anterior; 2 mm anterior (refer to part B) Margins, DCIS: No DCIS present LCIS negative     Closest, DCIS: Anterior/less than 1 mm; medial 8 mm (see part D) Lymphovascular invasion: Not identified Prognostic markers:  ER positive, PR positive, Her2 negative, Ki-67 5% Other: N/A See oncology table  B. BREAST, RIGHT MEDIAL MARGIN, EXCISION: -  Invasive lobular carcinoma (2 mm from the new  medial margin).  C. BREAST, RIGHT SUPERIOR MARGIN, EXCISION: -  Benign breast tissue, negative for malignancy (new superior margin 9 mm)  D. BREAST, RIGHT LATERAL MARGIN, EXCISION: -  Focal atypical lobular hyperplasia (8 mm from the new medial margin). -  Negative for invasive lobular carcinoma.  E. BREAST, RIGHT INFERIOR MARGIN, EXCISION: -  Benign breast tissue, negative for malignancy (new margin 1.0 cm).  F. BREAST, RIGHT POSTERIOR MARGIN, EXCISION: -  Benign breast tissue, negative for malignancy (new margin 7 mm).  G. LYMPH NODE, RIGHT AXILLARY #1, SENTINEL, EXCISION: -  1 lymph node positive for malignancy (11 mm in greatest dimension), negative for extracapsular invasion (1/1).  H. LYMPH NODE, RIGHT AXILLARY #2, SENTINEL, EXCISION: -  1 lymph node positive for malignancy (patchy positivity with largest focus 4 mm), negative for extracapsular invasion (1/1).  I. LYMPH NODE, RIGHT AXILLARY, SENTINEL, EXCISION: -  1 lymph node positive for malignancy (4 mm in greatest dimension) with focal minimal extracapsular extension (1/1).  J. LYMPH NODE, RIGHT AXILLARY, SENTINEL, EXCISION: -  1 lymph node positive for malignancy (patchy positivity overall 2 mm in greatest dimension), negative for extracapsular invasion (1/1).  K. LYMPH NODE, RIGHT AXILLARY #3, SENTINEL, EXCISION: -  1 lymph node morphologically negative for malignancy (0/1), multiple levels examined.    Physical Examination:    Right breast:  small axillary seroma.  No erythema.  No drainage.  There is some axillary cording   Assessment and Plan:    Assessment Diagnoses and all orders for this  visit:   Malignant neoplasm of upper-outer quadrant of right breast in female, estrogen receptor positive (CMS/HHS-HCC) -     Ambulatory Referral to Physical Therapy -     gabapentin (NEURONTIN) 100 MG capsule; Take 1 capsule (100 mg total) by mouth at bedtime   Breast cancer metastasized to axillary lymph node,  right (CMS/HHS-HCC) -     Ambulatory Referral to Physical Therapy     Axillary lymph node dissection and port placement is planned.  I reviewed the port model with the patient as well as risks of surgery.  I discussed risk of pneumothorax and port malfunction.   I also discussed axillary lymph node dissection with risk of lymphedema, nerve damage including muscle deficits, and then the general anesthesia complications.  I reviewed that there would be a drain in place and that that would be in place for usually about 1-1/2 to 2-1/2 weeks.  I discussed that we pull that based on output.   Mammogram will be due eventually 6 months post XRT.      I am going to try to get her into see physical therapy before we do the axillary lymph node dissection so they can go ahead and teach her about the cording.  Also she should get measured for a compression sleeve.  I have messaged them.

## 2023-05-18 NOTE — Anesthesia Postprocedure Evaluation (Signed)
 Anesthesia Post Note  Patient: Brittany [name]  Procedure(s) Performed: RIGHT AXILLARY LYMPH NODE DISSECTION (Right) PORT PLACEMENT WITH ULTRASOUND GUIDANCE     Patient location during evaluation: PACU Anesthesia Type: General and Regional Level of consciousness: awake and alert Pain management: pain level controlled Vital Signs Assessment: post-procedure vital signs reviewed and stable Respiratory status: spontaneous breathing, nonlabored ventilation and respiratory function stable Cardiovascular status: blood pressure returned to baseline and stable Postop Assessment: no apparent nausea or vomiting Anesthetic complications: no  No notable events documented.  Last Vitals:  Vitals:   05/18/23 1645 05/18/23 1700  BP: 124/89 133/78  Pulse: 67 66  Resp: 15 12  Temp:  36.6 C  SpO2: 91% 94%    Last Pain:  Vitals:   05/18/23 1700  PainSc: 0-No pain                 Aqib Lough,W. EDMOND

## 2023-05-18 NOTE — Interval H&P Note (Signed)
 History and Physical Interval Note:  05/18/2023 12:44 PM  Brittany [name]  has presented today for surgery, with the diagnosis of RIGHT BREAST CANCER.  The various methods of treatment have been discussed with the patient and family. After consideration of risks, benefits and other options for treatment, the patient has consented to  Procedure(s) with comments: RIGHT AXILLARY LYMPH NODE DISSECTION (Right) - PEC BLOCK PORT PLACEMENT WITH ULTRASOUND GUIDANCE (N/A) as a surgical intervention.  The patient's history has been reviewed, patient examined, no change in status, stable for surgery.  I have reviewed the patient's chart and labs.  Questions were answered to the patient's satisfaction.     [name]

## 2023-05-18 NOTE — Transfer of Care (Signed)
 Immediate Anesthesia Transfer of Care Note  Patient: Brittany [name]  Procedure(s) Performed: RIGHT AXILLARY LYMPH NODE DISSECTION (Right) PORT PLACEMENT WITH ULTRASOUND GUIDANCE  Patient Location: PACU  Anesthesia Type:GA combined with regional for post-op pain  Level of Consciousness: awake, alert , and oriented  Airway & Oxygen Therapy: Patient Spontanous Breathing  Post-op Assessment: Report given to RN and Post -op Vital signs reviewed and stable  Post vital signs: Reviewed and stable  Last Vitals:  Vitals Value Taken Time  BP 142/91   Temp    Pulse 93 05/18/23 1528  Resp 11   SpO2 94 % 05/18/23 1528  Vitals shown include unfiled device data.  Last Pain:  Vitals:   05/18/23 1139  PainSc: 0-No pain         Complications: No notable events documented.

## 2023-05-18 NOTE — Anesthesia Procedure Notes (Signed)
 Anesthesia Regional Block: Pectoralis block   Pre-Anesthetic Checklist: , timeout performed,  Correct Patient, Correct Site, Correct Laterality,  Correct Procedure, Correct Position, site marked,  Risks and benefits discussed,  Pre-op evaluation,  At surgeon's request and post-op pain management  Laterality: Right  Prep: Maximum Sterile Barrier Precautions used, chloraprep       Needles:  Injection technique: Single-shot  Needle Type: Echogenic Stimulator Needle     Needle Length: 9cm  Needle Gauge: 21     Additional Needles:   Procedures:,,,, ultrasound used (permanent image in chart),,    Narrative:  Start time: 05/18/2023 12:04 PM End time: 05/18/2023 12:14 PM Injection made incrementally with aspirations every 5 mL.  Performed by: Personally  Anesthesiologist: Epifanio Fallow, MD

## 2023-05-18 NOTE — Op Note (Signed)
 PRE-OPERATIVE DIAGNOSIS: Right breast cancer upper outer quadrant, pT1cpN2a, invasive lobular carcinoma, grade 3, +/+/-, Ki 67 5%  POST-OPERATIVE DIAGNOSIS:  Same  PROCEDURE:  Procedure(s): Right axillary lymph node dissection, left subclavian ClearVue port placement  SURGEON:  Surgeon(s): [name], MD  ANESTHESIA:   local and regional  DRAINS: (19 Fr ) Blake drain(s) in the right lateral chest wall    LOCAL MEDICATIONS USED:  BUPIVICAINE  and LIDOCAINE    SPECIMEN:  Source of Specimen:  right axillary contents  DISPOSITION OF SPECIMEN:  PATHOLOGY  COUNTS:  YES  DICTATION: .Dragon Dictation  PLAN OF CARE: Discharge to home after PACU  PATIENT DISPOSITION:  PACU - hemodynamically stable.  FINDINGS:  adhesions in the axilla consistent with prior SLN bx.  Thoracodorsal nerve and long thoracic nerve found, traced out, and avoided. Port tip cut at 25 cm and was in the SVC on fluoro.   EBL: min  PROCEDURE:   Pt was identified in the holding area and taken to   the operating room, where patient was placed supine on the operating room   table.  General anesthesia was induced.  Patient's left arm was tucked.  The right arm, chest, breast as well as the left upper chest and neck were prepped and draped in sterile fashion.  Time-out was   performed according to the surgical safety check list.  When all was   correct, we continued.    An curvalinear incision was made in the axilla extending the previous sentinel node incision. An axillary dissection was performed with removal of the associated lymph nodes and surrounding adipose tissue. This included levels I and II. This was accomplished by exposing the axillary vein anteriorly and inferiorly to the level of the pectoralis minor and laterally over the latissimus dorsi muscle. Posteriorly, the dissection continued to the subscapularis.  Small venous tributaries, lymphatics, and vessels were clipped and ligated or cauterized and divided.  The subscapularis muscle was skeletonized. The long thoracic and thoracodorsal neurovascular bundles were identified and preserved.  A 19 Fr blake drain was placed in the lateral right chest wall.  This was secured with a 2-0 nylon.  The wound was irrigated and closed with a 3-0 Vicryl deep dermal interrupted and a 4-0 vicryl subcuticular closure in layers.   Local anesthetic was administered just under the angle of the left clavicle.  The vein was accessed with 1 pass(es) of the needle. There was good venous return, but the wire would not pass.  A different angle was taken and the wire did pass partially.  Downward traction was placed on the arm and fluoro was used to visualize the wire at the junction of the left subclavian and the innominate vein.  Traction and some twisting was applied to the wire and the wire passed easily with no ectopy into the proper location.  Fluoroscopy was used to confirm that the wire was in the vena cava.      The patient was placed back level and the area for the pocket was anethetized   with local anesthetic.  A 3-cm transverse incision was made with a #15   blade.  Cautery was used to divide the subcutaneous tissues down to the   pectoralis muscle.  An [employer] retractor was used to elevate the skin   while a pocket was created on top of the pectoralis fascia.  The port   was placed into the pocket to confirm that it was of adequate size.  The  catheter was preattached to the port.  The port was then secured to the   pectoralis fascia with four 2-0 Prolene sutures.  These were clamped and   not tied down yet.    The catheter was tunneled through to the wire exit   site.  The catheter was placed along the wire to determine what length it should be to be in the SVC.  The catheter was cut at 25 cm.  The tunneler sheath and dilator were passed over the wire and the dilator and wire were removed.  The catheter was advanced through the tunneler sheath and the tunneler  sheath was pulled away.  Care was taken to keep the catheter in the tunneler sheath as this occurred. This was advanced and the tunneler sheath was removed.  There was good venous   return and easy flush of the catheter.  The Prolene sutures were tied   down to the pectoral fascia.  The skin was reapproximated using 3-0   Vicryl interrupted deep dermal sutures.    Fluoroscopy was used to re-confirm good position of the catheter.  The skin   was then closed using 4-0 Monocryl in a subcuticular fashion.  The port was flushed with concentrated heparin  flush as well.  The wounds were then cleaned, dried, and dressed with Dermabond.  The patient was awakened from anesthesia and taken to the PACU in stable condition.  Needle, sponge, and instrument counts were correct.

## 2023-05-19 ENCOUNTER — Encounter (HOSPITAL_COMMUNITY): Payer: Self-pay | Admitting: General Surgery

## 2023-05-20 LAB — SURGICAL PATHOLOGY

## 2023-05-23 ENCOUNTER — Telehealth: Payer: Self-pay | Admitting: General Surgery

## 2023-05-23 ENCOUNTER — Other Ambulatory Visit: Payer: Self-pay | Admitting: Family

## 2023-05-23 NOTE — Telephone Encounter (Signed)
 Left message regarding path from [facility]

## 2023-05-24 ENCOUNTER — Encounter: Payer: Self-pay | Admitting: *Deleted

## 2023-05-25 ENCOUNTER — Ambulatory Visit: Payer: Medicare Other | Admitting: Hematology and Oncology

## 2023-05-26 ENCOUNTER — Inpatient Hospital Stay: Payer: Medicare Other | Admitting: Pharmacist

## 2023-05-26 ENCOUNTER — Inpatient Hospital Stay: Payer: Medicare Other | Attending: Hematology and Oncology

## 2023-05-26 DIAGNOSIS — Z79630 Long term (current) use of alkylating agent: Secondary | ICD-10-CM | POA: Diagnosis not present

## 2023-05-26 DIAGNOSIS — Z170 Estrogen receptor positive status [ER+]: Secondary | ICD-10-CM

## 2023-05-26 DIAGNOSIS — Z79899 Other long term (current) drug therapy: Secondary | ICD-10-CM | POA: Insufficient documentation

## 2023-05-26 DIAGNOSIS — Z5111 Encounter for antineoplastic chemotherapy: Secondary | ICD-10-CM | POA: Diagnosis present

## 2023-05-26 DIAGNOSIS — Z79632 Long term (current) use of antitumor antibiotic: Secondary | ICD-10-CM | POA: Diagnosis not present

## 2023-05-26 DIAGNOSIS — Z1721 Progesterone receptor positive status: Secondary | ICD-10-CM | POA: Insufficient documentation

## 2023-05-26 DIAGNOSIS — Z1732 Human epidermal growth factor receptor 2 negative status: Secondary | ICD-10-CM | POA: Insufficient documentation

## 2023-05-26 DIAGNOSIS — C50411 Malignant neoplasm of upper-outer quadrant of right female breast: Secondary | ICD-10-CM | POA: Insufficient documentation

## 2023-05-26 MED ORDER — PROCHLORPERAZINE MALEATE 10 MG PO TABS: 10.0000 mg | ORAL_TABLET | Freq: Four times a day (QID) | ORAL | 1 refills | Status: DC | PRN

## 2023-05-26 MED ORDER — LIDOCAINE-PRILOCAINE 2.5-2.5 % EX CREA: TOPICAL_CREAM | CUTANEOUS | 3 refills | Status: DC

## 2023-05-26 MED ORDER — DEXAMETHASONE 4 MG PO TABS: ORAL_TABLET | ORAL | 1 refills | Status: DC

## 2023-05-26 MED ORDER — ONDANSETRON HCL 8 MG PO TABS: ORAL_TABLET | ORAL | 1 refills | Status: DC

## 2023-05-26 NOTE — Progress Notes (Signed)
[name]       Telephone: [phone]?Fax: [phone]   Oncology Clinical Pharmacist Practitioner Initial Assessment  Brittany [name] is a 68 y.o. female with a diagnosis of breast cancer. They were contacted today via in-person visit.  Indication/Regimen Doxorubicin (Adriamycin) and cyclophosphamide (Cytoxan) followed by paclitaxel (Taxol) are being used appropriately for treatment of breast cancer by Dr. Rachel Moulds.      Wt Readings from Last 1 Encounters:  05/18/23 170 lb (77.1 kg)    Estimated body surface area is 1.87 meters squared as calculated from the following:   Height as of 05/18/23: 5\' 4"  (1.626 m).   Weight as of 05/18/23: 170 lb (77.1 kg).  The dosing regimen is every 14 days for 4 cycles  Doxorubicin (60 mg/m2) on Day 1 Cyclophosphamide (600 mg/m2) on Day 1 Pegfilgrastim (6 mg) on Day 3  Followed by a dosing regimen that is every 7 days for 12 cycles  Paclitaxel (80 mg/m2) on Day 1  It is planned to continue until treatment plan completion or unacceptable toxicity. The tentative start date is: 05/31/23  Dose Modifications None at this time   Allergies Allergies  Allergen Reactions   Wellbutrin [Bupropion] Hives    Vitals: No vitals or labs were done today for this chemotherapy education visit     05/18/2023    5:00 PM 05/18/2023    4:45 PM 05/18/2023    4:30 PM  Oncology Vitals  Temp 97.8 F (36.6 C)    Pulse Rate 66 67 76  BP 133/78 124/89 125/79  Resp 12 15 10   SpO2 94 % 91 % 93 %     Laboratory Data    Latest Ref Rng & Units 05/13/2023   10:40 AM 03/16/2023    8:39 AM 01/28/2023   10:33 AM  CBC EXTENDED  WBC 4.0 - 10.5 K/uL 7.5  7.0  7.1   RBC 3.87 - 5.11 MIL/uL 4.15  4.28  4.48   Hemoglobin 12.0 - 15.0 g/dL 64.3  32.9  51.8   HCT 36.0 - 46.0 % 39.7  40.3  42.8   Platelets 150 - 400 K/uL 314  313  359.0   NEUT# 1.7 - 7.7 K/uL  3.3    Lymph# 0.7 - 4.0 K/uL  3.0         Latest Ref Rng & Units 03/16/2023    8:39  AM 01/28/2023   10:33 AM 07/23/2021    4:28 PM  CMP  Glucose 70 - 99 mg/dL 841  94  90   BUN 8 - 23 mg/dL 19  18  11    Creatinine 0.44 - 1.00 mg/dL 6.60  6.30  1.60   Sodium 135 - 145 mmol/L 140  138  137   Potassium 3.5 - 5.1 mmol/L 4.2  4.4  3.9   Chloride 98 - 111 mmol/L 108  104  104   CO2 22 - 32 mmol/L 26  25  26    Calcium 8.9 - 10.3 mg/dL 9.3  9.5  9.4   Total Protein 6.5 - 8.1 g/dL 7.0  7.1  7.2   Total Bilirubin <1.2 mg/dL 0.5  0.6  0.5   Alkaline Phos 38 - 126 U/L 79  77  83   AST 15 - 41 U/L 22  22  20    ALT 0 - 44 U/L 32  25  19    Contraindications Contraindications were reviewed? Yes Contraindications to therapy were identified? No  Safety Precautions The following safety precautions were reviewed:  Fever: reviewed the importance of having a thermometer and the Centers for Disease Control and Prevention (CDC) definition of fever which is 100.51F (38C) or higher. Patient should call 24/7 triage at [phone] if experiencing a fever or any other symptoms Decreased white blood cells (WBCs) and increased risk for infection Decreased platelet count and increased risk of bleeding Decreased hemoglobin, part of the red blood cells that carry iron and oxygen Change in the color of urine Fatigue Hair loss Nausea or vomiting Mouth irritation or sores Doxorubicin (vesicant) Cardiotoxicity Hemorrhagic cystitis Secondary cancers Muscle or joint pain or weakness Peripheral neuropathy Hypersensitivity reactions Avoid grapefruit products Intimacy, sexual activity, contraception, fertility Handling body fluids and waste  Medication Reconciliation Current Outpatient Medications  Medication Sig Dispense Refill   ALPHA LIPOIC ACID PO Take 300 mg by mouth daily.     cycloSPORINE (RESTASIS) 0.05 % ophthalmic emulsion Place 1 drop into both eyes 2 (two) times daily.     EVENING PRIMROSE OIL PO Take 1 tablet by mouth daily.     Flaxseed, Linseed, (FLAX SEED OIL PO) Take 1  capsule by mouth daily.     gabapentin (NEURONTIN) 100 MG capsule Take 100 mg by mouth at bedtime as needed.     glucosamine-chondroitin 500-400 MG tablet Take 1 tablet by mouth daily.     MAGNESIUM GLYCINATE PO Take 350 mg by mouth daily.     Methylsulfonylmethane (MSM) 1000 MG TABS Take 1 tablet by mouth 3 (three) times a week.     OVER THE COUNTER MEDICATION Holy basil -   5 times a week     VITAMIN D PO Take 1 tablet by mouth daily.     ASHWAGANDHA PO Take 1 tablet by mouth 2 (two) times a week. (Patient not taking: Reported on 05/26/2023)     Biotin 5000 MCG CAPS Take 1 capsule by mouth daily.     dexamethasone (DECADRON) 4 MG tablet Take 2 tablets (8 mg total) by mouth daily for 3 days. Start the day after doxorubicin/cyclophosphamide chemotherapy. Take with food. (Patient not taking: Reported on 05/26/2023) 30 tablet 1   lidocaine-prilocaine (EMLA) cream Apply to affected area once (Patient not taking: Reported on 05/26/2023) 30 g 3   methocarbamol (ROBAXIN) 500 MG tablet Take 1 tablet (500 mg total) by mouth every 8 (eight) hours as needed (use for muscle cramps/pain). (Patient not taking: Reported on 05/26/2023) 20 tablet 2   ondansetron (ZOFRAN) 8 MG tablet Take 1 tab (8 mg) by mouth every 8 hrs as needed for nausea/vomiting. Start third day after doxorubicin/cyclophosphamide chemotherapy. (Patient not taking: Reported on 05/26/2023) 30 tablet 1   PARoxetine (PAXIL) 10 MG tablet Take 1 tablet (10 mg total) by mouth daily. 30 tablet 1   prochlorperazine (COMPAZINE) 10 MG tablet Take 1 tablet (10 mg total) by mouth every 6 (six) hours as needed for nausea or vomiting. (Patient not taking: Reported on 05/26/2023) 30 tablet 1   pyridoxine (B-6) 100 MG tablet Take 100 mg by mouth daily. (Patient not taking: Reported on 05/26/2023)     vitamin B-12 (CYANOCOBALAMIN) 500 MCG tablet Take 500 mcg by mouth daily. (Patient not taking: Reported on 05/26/2023)     vitamin C (ASCORBIC ACID) 250 MG tablet Take  250 mg by mouth daily. (Patient not taking: Reported on 05/26/2023)     No current facility-administered medications for this visit.   Medication reconciliation is based on the patient's most recent  medication list in the electronic medical record (EMR) including herbal products and OTC medications.   The patient's medication list was reviewed today with the patient? Yes   Drug-drug interactions (DDIs) DDIs were evaluated? Yes Significant DDIs identified? No   Drug-Food Interactions Drug-food interactions were evaluated? Yes Drug-food interactions identified?  Avoid grapefruit products  Follow-up Plan  Treatment start date: 05/31/23 Port placement date: 05/18/23 ECHO date: 04/26/23 We reviewed the prescriptions, premedications, and treatment regimen with the patient. Possible side effects of the treatment regimen were reviewed and management strategies were discussed.  Can use loperamide as needed for diarrhea, loratadine as needed for G-CSF bone pain, and Senna-S as needed for constipation.  Prescriptions released to her local pharmacy of choice She is not interested in [employer] Providing antioxidant article as requested -- American Cancer Society article: https://www.cancer.org/cancer/latest-news/study-finds-antioxidants-risky-during-breast-cancer-chemotherapy.html  Clinical pharmacy will assist Dr. Rachel Moulds and Tomie China on an as needed basis going forward  Tomie China participated in the discussion, expressed understanding, and voiced agreement with the above plan. All questions were answered to her satisfaction. The patient was advised to contact the clinic at [phone] with any questions or concerns prior to her return visit.   I spent 60 minutes assessing the patient.  Rishikesh Khachatryan A. Odetta Pink, PharmD, BCOP, CPP  Anselm Lis, RPH-CPP, 05/26/2023 12:00 PM  **Disclaimer: This note was dictated with voice recognition software. Similar sounding words can  inadvertently be transcribed and this note may contain transcription errors which may not have been corrected upon publication of note.**

## 2023-05-30 MED FILL — Fosaprepitant Dimeglumine For IV Infusion 150 MG (Base Eq): INTRAVENOUS | Qty: 5 | Status: AC

## 2023-05-31 ENCOUNTER — Inpatient Hospital Stay: Payer: Medicare Other

## 2023-05-31 ENCOUNTER — Telehealth: Payer: Self-pay | Admitting: Hematology and Oncology

## 2023-05-31 ENCOUNTER — Inpatient Hospital Stay (HOSPITAL_BASED_OUTPATIENT_CLINIC_OR_DEPARTMENT_OTHER): Payer: Medicare Other | Admitting: Hematology and Oncology

## 2023-05-31 VITALS — BP 141/78 | HR 73 | Temp 97.7°F | Resp 18 | Wt 170.3 lb

## 2023-05-31 DIAGNOSIS — Z170 Estrogen receptor positive status [ER+]: Secondary | ICD-10-CM | POA: Diagnosis not present

## 2023-05-31 DIAGNOSIS — C50411 Malignant neoplasm of upper-outer quadrant of right female breast: Secondary | ICD-10-CM | POA: Diagnosis not present

## 2023-05-31 DIAGNOSIS — Z5111 Encounter for antineoplastic chemotherapy: Secondary | ICD-10-CM | POA: Diagnosis not present

## 2023-05-31 DIAGNOSIS — Z95828 Presence of other vascular implants and grafts: Secondary | ICD-10-CM | POA: Insufficient documentation

## 2023-05-31 HISTORY — DX: Presence of other vascular implants and grafts: Z95.828

## 2023-05-31 LAB — CBC WITH DIFFERENTIAL (CANCER CENTER ONLY)
Abs Immature Granulocytes: 0.01 10*3/uL (ref 0.00–0.07)
Basophils Absolute: 0 10*3/uL (ref 0.0–0.1)
Basophils Relative: 0 %
Eosinophils Absolute: 0.3 10*3/uL (ref 0.0–0.5)
Eosinophils Relative: 4 %
HCT: 38.4 % (ref 36.0–46.0)
Hemoglobin: 12.9 g/dL (ref 12.0–15.0)
Immature Granulocytes: 0 %
Lymphocytes Relative: 39 %
Lymphs Abs: 2.9 10*3/uL (ref 0.7–4.0)
MCH: 32 pg (ref 26.0–34.0)
MCHC: 33.6 g/dL (ref 30.0–36.0)
MCV: 95.3 fL (ref 80.0–100.0)
Monocytes Absolute: 0.8 10*3/uL (ref 0.1–1.0)
Monocytes Relative: 10 %
Neutro Abs: 3.5 10*3/uL (ref 1.7–7.7)
Neutrophils Relative %: 47 %
Platelet Count: 316 10*3/uL (ref 150–400)
RBC: 4.03 MIL/uL (ref 3.87–5.11)
RDW: 13 % (ref 11.5–15.5)
WBC Count: 7.5 10*3/uL (ref 4.0–10.5)
nRBC: 0 % (ref 0.0–0.2)

## 2023-05-31 LAB — CMP (CANCER CENTER ONLY)
ALT: 52 U/L — ABNORMAL HIGH (ref 0–44)
AST: 40 U/L (ref 15–41)
Albumin: 4.1 g/dL (ref 3.5–5.0)
Alkaline Phosphatase: 74 U/L (ref 38–126)
Anion gap: 6 (ref 5–15)
BUN: 16 mg/dL (ref 8–23)
CO2: 27 mmol/L (ref 22–32)
Calcium: 9.2 mg/dL (ref 8.9–10.3)
Chloride: 106 mmol/L (ref 98–111)
Creatinine: 0.9 mg/dL (ref 0.44–1.00)
GFR, Estimated: >60 mL/min (ref >60)
Glucose, Bld: 96 mg/dL (ref 70–99)
Potassium: 4 mmol/L (ref 3.5–5.1)
Sodium: 139 mmol/L (ref 135–145)
Total Bilirubin: 0.7 mg/dL (ref 0.0–1.2)
Total Protein: 6.6 g/dL (ref 6.5–8.1)

## 2023-05-31 MED ORDER — SODIUM CHLORIDE 0.9% FLUSH
10.0000 mL | Freq: Once | INTRAVENOUS | Status: AC
  Administered 2023-05-31: 10 mL

## 2023-05-31 NOTE — Progress Notes (Signed)
[facility] CONSULT NOTE  Patient Care Team: [name], NP as PCP - General (Nurse Practitioner) Donnelly Angelica, RN as Oncology Nurse Navigator Pershing Proud, RN as Oncology Nurse Navigator Almond Lint, MD as Consulting Physician (General Surgery) Rachel Moulds, MD as Consulting Physician (Hematology and Oncology) Dorothy Puffer, MD as Consulting Physician (Radiation Oncology)  CHIEF COMPLAINTS/PURPOSE OF CONSULTATION:  Newly diagnosed breast cancer  HISTORY OF PRESENTING ILLNESS:  Brittany [name] 68 y.o. female is here because of recent diagnosis of right breast cancer  I reviewed her records extensively and collaborated the history with the patient.  SUMMARY OF ONCOLOGIC HISTORY: Oncology History  Malignant neoplasm of upper-outer quadrant of right breast in female, estrogen receptor positive (HCC)  02/22/2023 Mammogram   Screening mammogram on February 22, 2023 showed possible mass in the right breast with calcifications warranting further evaluation.  No findings suspicious for malignancy in the left breast.  Diagnostic mammogram done confirmed a persistent irregular mass within the central right breast, adjacent to the mass are slightly heterogeneous calcifications.  Ultrasound showed 1.1 x 1.2 x 1.3 cm irregular hypoechoic mass at 10 o'clock position of the right breast 1 cm from the nipple, no abnormal right axillary lymph nodes are noted.   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  Prognostic showed ER 100% positive strong staining PR 95% positive strong staining Ki-67 of 5% and HER2 2+   03/14/2023 Initial Diagnosis   Malignant neoplasm of upper-outer quadrant of right breast in female, estrogen receptor positive (HCC)    Genetic Testing   [facility] CancerNext-Expanded Panel+RNA was Negative. Report date is  03/27/2023.   The CancerNext-Expanded gene panel offered by [facility] and includes sequencing, rearrangement, and RNA analysis for the following 71 genes: AIP, ALK, APC, ATM, AXIN2, BAP1, BARD1, BMPR1A, BRCA1, BRCA2, BRIP1, CDC73, CDH1, CDK4, CDKN1B, CDKN2A, CHEK2, CTNNA1, DICER1, FH, FLCN, KIF1B, LZTR1, MAX, MEN1, MET, MLH1, MSH2, MSH3, MSH6, MUTYH, NF1, NF2, NTHL1, PALB2, PHOX2B, PMS2, POT1, PRKAR1A, PTCH1, PTEN, RAD51C, RAD51D, RB1, RET, SDHA, SDHAF2, SDHB, SDHC, SDHD, SMAD4, SMARCA4, SMARCB1, SMARCE1, STK11, SUFU, TMEM127, TP53, TSC1, TSC2, and VHL (sequencing and deletion/duplication); EGFR, EGLN1, HOXB13, KIT, MITF, PDGFRA, POLD1, and POLE (sequencing only); EPCAM and GREM1 (deletion/duplication only).    05/31/2023 Cancer Staging   Staging form: Breast, AJCC 8th Edition - Pathologic stage from 05/31/2023: Stage IB (pT1c, pN2a, cM0, G2, ER+, PR+, HER2-) - Signed by Rachel Moulds, MD on 05/31/2023 Stage prefix: Initial diagnosis Method of lymph node assessment: Axillary lymph node dissection Histologic grading system: 3 grade system   06/09/2023 -  Chemotherapy   Patient is on Treatment Plan : BREAST DOSE DENSE AC q14d / PACLitaxel q7d      Ms Throne is here for initial visit by herself.  At baseline she is very healthy, takes several nutritional supplements, is a retired Emergency planning/management officer, had hysterectomy in 2018 otherwise no major medical issues or surgical issues.  Her mother had breast cancer at the age of 58.  She has 1 daughter and her daughter is also currently working in [place].    Discussed the use of AI scribe software for clinical note transcription with the patient, who gave verbal consent to proceed.  History of Present Illness    The patient, with a history of breast cancer, presents for a follow-up after recent surgery.  Rest of the pertinent 10 point ROS reviewed and negative  MEDICAL HISTORY:  Past Medical History:  Diagnosis Date   Anxiety and depression     Elevated LDL cholesterol level    Elevated liver enzymes 07/23/2021   GERD (gastroesophageal reflux disease)    Malignant neoplasm of breast (female) (HCC)    Thyroid disorder 11/14/2017    SURGICAL HISTORY: Past Surgical History:  Procedure Laterality Date   AXILLARY LYMPH NODE DISSECTION Right 05/18/2023   Procedure: RIGHT AXILLARY LYMPH NODE DISSECTION;  Surgeon: Almond Lint, MD;  Location: [facility] OR;  Service: General;  Laterality: Right;  PEC BLOCK   BREAST BIOPSY Right 03/09/2023   [place] RT BREAST BX W LOC DEV 1ST LESION IMG BX SPEC US GUIDE 03/09/2023 [facility] MAMMOGRAPHY   BREAST BIOPSY  04/12/2023   [place] RT RADIOACTIVE SEED LOC 04/12/2023 [facility] ULTRASOUND   BREAST LUMPECTOMY WITH RADIOACTIVE SEED AND SENTINEL LYMPH NODE BIOPSY Right 04/13/2023   Procedure: RIGHT BREAST SEED LOCALIZED LUMPECTOMY WITH SENTINEL NODE BIOPSY;  Surgeon: Almond Lint, MD;  Location: [facility];  Service: General;  Laterality: Right;   HERNIA REPAIR     LAPAROSCOPIC HYSTERECTOMY  03/2017   Partial, pt still has both adnexa   PORTACATH PLACEMENT N/A 05/18/2023   Procedure: PORT PLACEMENT WITH ULTRASOUND GUIDANCE;  Surgeon: Almond Lint, MD;  Location: [facility] OR;  Service: General;  Laterality: N/A;    SOCIAL HISTORY: Social History   Socioeconomic History   Marital status: Divorced    Spouse name: Not on file   Number of children: Not on file   Years of education: Not on file   Highest education level: Master's degree (e.g., MA, MS, MEng, MEd, MSW, MBA)  Occupational History   Not on file  Tobacco Use   Smoking status: Former   Smokeless tobacco: Never   Tobacco comments:    stopped at age 60. smoked for 4-5 years 1 ppd  Vaping Use   Vaping status: Never Used  Substance and Sexual Activity   Alcohol use: Yes    Comment: WINE OCC   Drug use: Never   Sexual activity: Not Currently    Partners: Male    Birth control/protection: Surgical    Comment: hysterectomy  Other Topics Concern    Not on file  Social History Narrative   Not on file   Social Drivers of Health   Financial Resource Strain: Low Risk  (01/24/2023)   Overall Financial Resource Strain (CARDIA)    Difficulty of Paying Living Expenses: Not very hard  Food Insecurity: No Food Insecurity (03/16/2023)   Hunger Vital Sign    Worried About Running Out of Food in the Last Year: Never true    Ran Out of Food in the Last Year: Never true  Transportation Needs: No Transportation Needs (03/16/2023)   PRAPARE - Administrator, Civil Service (Medical): No    Lack of Transportation (Non-Medical): No  Physical Activity: Sufficiently Active (01/24/2023)   Exercise Vital Sign    Days of Exercise per Week: 3 days    Minutes of Exercise per Session: 120 min  Stress: Stress Concern Present (01/24/2023)   [employer] of Occupational Health - Occupational Stress Questionnaire    Feeling of Stress : To some extent  Social Connections: Unknown (01/24/2023)   Social Connection and Isolation Panel [NHANES]    Frequency of Communication with Friends and Family: More than three times a week    Frequency of Social Gatherings with Friends  and Family: Twice a week    Attends Religious Services: Patient declined    Active Member of Clubs or Organizations: No    Attends Engineer, structural: Not on file    Marital Status: Divorced  Intimate Partner Violence: Not At Risk (03/16/2023)   Humiliation, Afraid, Rape, and Kick questionnaire    Fear of Current or Ex-Partner: No    Emotionally Abused: No    Physically Abused: No    Sexually Abused: No    FAMILY HISTORY: Family History  Problem Relation Age of Onset   Breast cancer Mother 44   Diabetes Mother    Hypertension Mother    Cancer Father 71 - 4       liver?   Diabetes Brother    Cancer Paternal Uncle    Breast cancer Paternal Grandmother 25    ALLERGIES:  is allergic to wellbutrin [bupropion].  MEDICATIONS:  Current Outpatient Medications   Medication Sig Dispense Refill   ALPHA LIPOIC ACID PO Take 300 mg by mouth daily.     ASHWAGANDHA PO Take 1 tablet by mouth 2 (two) times a week. (Patient not taking: Reported on 05/26/2023)     Biotin 5000 MCG CAPS Take 1 capsule by mouth daily.     cycloSPORINE (RESTASIS) 0.05 % ophthalmic emulsion Place 1 drop into both eyes 2 (two) times daily.     dexamethasone (DECADRON) 4 MG tablet Take 2 tablets (8 mg total) by mouth daily for 3 days. Start the day after doxorubicin/cyclophosphamide chemotherapy. Take with food. (Patient not taking: Reported on 05/26/2023) 30 tablet 1   EVENING PRIMROSE OIL PO Take 1 tablet by mouth daily.     Flaxseed, Linseed, (FLAX SEED OIL PO) Take 1 capsule by mouth daily.     gabapentin (NEURONTIN) 100 MG capsule Take 100 mg by mouth at bedtime as needed.     glucosamine-chondroitin 500-400 MG tablet Take 1 tablet by mouth daily.     lidocaine-prilocaine (EMLA) cream Apply to affected area once (Patient not taking: Reported on 05/26/2023) 30 g 3   MAGNESIUM GLYCINATE PO Take 350 mg by mouth daily.     methocarbamol (ROBAXIN) 500 MG tablet Take 1 tablet (500 mg total) by mouth every 8 (eight) hours as needed (use for muscle cramps/pain). (Patient not taking: Reported on 05/26/2023) 20 tablet 2   Methylsulfonylmethane (MSM) 1000 MG TABS Take 1 tablet by mouth 3 (three) times a week.     ondansetron (ZOFRAN) 8 MG tablet Take 1 tab (8 mg) by mouth every 8 hrs as needed for nausea/vomiting. Start third day after doxorubicin/cyclophosphamide chemotherapy. (Patient not taking: Reported on 05/26/2023) 30 tablet 1   OVER THE COUNTER MEDICATION Holy basil -   5 times a week     PARoxetine (PAXIL) 10 MG tablet Take 1 tablet (10 mg total) by mouth daily. 30 tablet 1   prochlorperazine (COMPAZINE) 10 MG tablet Take 1 tablet (10 mg total) by mouth every 6 (six) hours as needed for nausea or vomiting. (Patient not taking: Reported on 05/26/2023) 30 tablet 1   pyridoxine (B-6) 100 MG  tablet Take 100 mg by mouth daily. (Patient not taking: Reported on 05/26/2023)     vitamin B-12 (CYANOCOBALAMIN) 500 MCG tablet Take 500 mcg by mouth daily. (Patient not taking: Reported on 05/26/2023)     vitamin C (ASCORBIC ACID) 250 MG tablet Take 250 mg by mouth daily. (Patient not taking: Reported on 05/26/2023)     VITAMIN D PO Take 1  tablet by mouth daily.     No current facility-administered medications for this visit.    REVIEW OF SYSTEMS:   Constitutional: Denies fevers, chills or abnormal night sweats Eyes: Denies blurriness of vision, double vision or watery eyes Ears, nose, mouth, throat, and face: Denies mucositis or sore throat Respiratory: Denies cough, dyspnea or wheezes Cardiovascular: Denies palpitation, chest discomfort or lower extremity swelling Gastrointestinal:  Denies nausea, heartburn or change in bowel habits Skin: Denies abnormal skin rashes Lymphatics: Denies new lymphadenopathy or easy bruising Neurological:Denies numbness, tingling or new weaknesses Behavioral/Psych: Mood is stable, no new changes  Breast: Denies any palpable lumps or discharge All other systems were reviewed with the patient and are negative.  PHYSICAL EXAMINATION: ECOG PERFORMANCE STATUS: 0 - Asymptomatic  Vitals:   05/31/23 0907  BP: (!) 141/78  Pulse: 73  Resp: 18  Temp: 97.7 F (36.5 C)  SpO2: 96%    Filed Weights   05/31/23 0907  Weight: 170 lb 4.8 oz (77.2 kg)     GENERAL:alert, no distress and comfortable   LABORATORY DATA:  I have reviewed the data as listed Lab Results  Component Value Date   WBC 7.5 05/31/2023   HGB 12.9 05/31/2023   HCT 38.4 05/31/2023   MCV 95.3 05/31/2023   PLT 316 05/31/2023   Lab Results  Component Value Date   NA 139 05/31/2023   K 4.0 05/31/2023   CL 106 05/31/2023   CO2 27 05/31/2023    RADIOGRAPHIC STUDIES: I have personally reviewed the radiological reports and agreed with the findings in the report.  ASSESSMENT AND  PLAN:  Malignant neoplasm of upper-outer quadrant of right breast in female, estrogen receptor positive (HCC) This is a very pleasant 68 year old postmenopausal female patient with newly diagnosed right breast invasive lobular carcinoma, ER/PR positive, Ki-67 of 5% HER2 2+ FISH negative referred to breast [facility] for additional recommendations.  She is here now post surgery  Breast Cancer with Lymph Node Involvement Four out of five lymph nodes positive for macrometastasis, since her last visit she had ALND, 5 more LN with metastasis.  Overall staging with no change with she has 9 positive LN. PT1cN2a -Plan for chemotherapy (ACT regimen) due to high risk of developing breast cancer outside the breast. She still has drain in, so will defer chemo. -Discussed about staging scans but since she is still stage I despite multiple pos LN, we will not proceed with staging scans. -Discussed the regimen, adverse effects including but not limited to fatigue, nausea, constipation, immunosuppression, cardiac toxicity, neuropathy etc. - Anticipate chemo start next week once drain is removed.  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 -Monitor for worsening of neuropathy during chemotherapy, particularly during second half of treatment.  General Health Maintenance -ECHO satisfactory. -Hopefully if drain removal goes well, we will proceed with chemo next week -After chemo, she will benefit from AI with CDK 4/6 inhibition       All questions were answered. The patient knows to call the clinic with any problems, questions or concerns.    Rachel Moulds, MD 05/31/23

## 2023-05-31 NOTE — Assessment & Plan Note (Addendum)
This is a very pleasant 68 year old postmenopausal female patient with newly diagnosed right breast invasive lobular carcinoma, ER/PR positive, Ki-67 of 5% HER2 2+ FISH negative referred to breast [facility] for additional recommendations.  She is here now post surgery  Breast Cancer with Lymph Node Involvement Four out of five lymph nodes positive for macrometastasis, since her last visit she had ALND, 5 more LN with metastasis.  Overall staging with no change with she has 9 positive LN. PT1cN2a -Plan for chemotherapy (ACT regimen) due to high risk of developing breast cancer outside the breast. She still has drain in, so will defer chemo. -Discussed about staging scans but since she is still stage I despite multiple pos LN, we will not proceed with staging scans. -Discussed the regimen, adverse effects including but not limited to fatigue, nausea, constipation, immunosuppression, cardiac toxicity, neuropathy etc. - Anticipate chemo start next week once drain is removed.  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 -Monitor for worsening of neuropathy during chemotherapy, particularly during second half of treatment.  General Health Maintenance -ECHO satisfactory. -Hopefully if drain removal goes well, we will proceed with chemo next week -After chemo, she will benefit from AI with CDK 4/6 inhibition

## 2023-05-31 NOTE — Telephone Encounter (Signed)
Spoke with patient confirming upcoming appointment  

## 2023-06-01 ENCOUNTER — Telehealth: Payer: Self-pay | Admitting: Hematology and Oncology

## 2023-06-01 NOTE — Telephone Encounter (Signed)
Spoke with patient confirming upcoming appointment changes 

## 2023-06-02 ENCOUNTER — Other Ambulatory Visit: Payer: Self-pay

## 2023-06-02 ENCOUNTER — Telehealth: Payer: Self-pay | Admitting: *Deleted

## 2023-06-02 ENCOUNTER — Ambulatory Visit: Payer: Medicare Other

## 2023-06-02 NOTE — Telephone Encounter (Signed)
"  KIYONO HOYE, [phone] (home) calling to learn who I need to connect with about FMLA forms.  My daughter in [place], [place] sent form to me for a leave to help me beginning April 2025.  I begin chemotherapy 06/09/2023." Reviewed [facility] Forms process.  We ask to allow 7-10 business days to process.  ROI is required.   :I'll bring form in at 11:30 am 06/09/2023 before my appointment."   No further questions or needs.

## 2023-06-03 ENCOUNTER — Encounter: Payer: Self-pay | Admitting: *Deleted

## 2023-06-07 ENCOUNTER — Other Ambulatory Visit: Payer: Self-pay | Admitting: *Deleted

## 2023-06-07 DIAGNOSIS — Z170 Estrogen receptor positive status [ER+]: Secondary | ICD-10-CM

## 2023-06-08 ENCOUNTER — Ambulatory Visit: Payer: Self-pay

## 2023-06-08 MED FILL — Fosaprepitant Dimeglumine For IV Infusion 150 MG (Base Eq): INTRAVENOUS | Qty: 5 | Status: AC

## 2023-06-09 ENCOUNTER — Inpatient Hospital Stay: Payer: Medicare Other | Admitting: Hematology and Oncology

## 2023-06-09 ENCOUNTER — Inpatient Hospital Stay: Payer: Medicare Other

## 2023-06-09 ENCOUNTER — Other Ambulatory Visit: Payer: Medicare Other

## 2023-06-09 ENCOUNTER — Ambulatory Visit: Payer: Medicare Other

## 2023-06-09 ENCOUNTER — Ambulatory Visit: Payer: Medicare Other | Admitting: Hematology and Oncology

## 2023-06-09 VITALS — BP 126/78 | HR 76 | Temp 97.6°F | Resp 16 | Wt 170.7 lb

## 2023-06-09 VITALS — BP 132/76 | HR 74 | Resp 16

## 2023-06-09 DIAGNOSIS — Z170 Estrogen receptor positive status [ER+]: Secondary | ICD-10-CM

## 2023-06-09 DIAGNOSIS — C50411 Malignant neoplasm of upper-outer quadrant of right female breast: Secondary | ICD-10-CM | POA: Diagnosis not present

## 2023-06-09 DIAGNOSIS — Z95828 Presence of other vascular implants and grafts: Secondary | ICD-10-CM

## 2023-06-09 DIAGNOSIS — Z5111 Encounter for antineoplastic chemotherapy: Secondary | ICD-10-CM | POA: Diagnosis not present

## 2023-06-09 LAB — CBC WITH DIFFERENTIAL (CANCER CENTER ONLY)
Abs Immature Granulocytes: 0.01 10*3/uL (ref 0.00–0.07)
Basophils Absolute: 0 10*3/uL (ref 0.0–0.1)
Basophils Relative: 1 %
Eosinophils Absolute: 0.2 10*3/uL (ref 0.0–0.5)
Eosinophils Relative: 3 %
HCT: 38.2 % (ref 36.0–46.0)
Hemoglobin: 12.8 g/dL (ref 12.0–15.0)
Immature Granulocytes: 0 %
Lymphocytes Relative: 43 %
Lymphs Abs: 2.5 10*3/uL (ref 0.7–4.0)
MCH: 31.8 pg (ref 26.0–34.0)
MCHC: 33.5 g/dL (ref 30.0–36.0)
MCV: 95 fL (ref 80.0–100.0)
Monocytes Absolute: 0.6 10*3/uL (ref 0.1–1.0)
Monocytes Relative: 10 %
Neutro Abs: 2.5 10*3/uL (ref 1.7–7.7)
Neutrophils Relative %: 43 %
Platelet Count: 312 10*3/uL (ref 150–400)
RBC: 4.02 MIL/uL (ref 3.87–5.11)
RDW: 13 % (ref 11.5–15.5)
WBC Count: 5.8 10*3/uL (ref 4.0–10.5)
nRBC: 0 % (ref 0.0–0.2)

## 2023-06-09 LAB — CMP (CANCER CENTER ONLY)
ALT: 25 U/L (ref 0–44)
AST: 18 U/L (ref 15–41)
Albumin: 4 g/dL (ref 3.5–5.0)
Alkaline Phosphatase: 85 U/L (ref 38–126)
Anion gap: 3 — ABNORMAL LOW (ref 5–15)
BUN: 15 mg/dL (ref 8–23)
CO2: 29 mmol/L (ref 22–32)
Calcium: 9.2 mg/dL (ref 8.9–10.3)
Chloride: 108 mmol/L (ref 98–111)
Creatinine: 0.82 mg/dL (ref 0.44–1.00)
GFR, Estimated: >60 mL/min (ref >60)
Glucose, Bld: 84 mg/dL (ref 70–99)
Potassium: 4.2 mmol/L (ref 3.5–5.1)
Sodium: 140 mmol/L (ref 135–145)
Total Bilirubin: 0.4 mg/dL (ref 0.0–1.2)
Total Protein: 6.5 g/dL (ref 6.5–8.1)

## 2023-06-09 MED ORDER — DEXAMETHASONE SODIUM PHOSPHATE 10 MG/ML IJ SOLN
10.0000 mg | Freq: Once | INTRAMUSCULAR | Status: AC
  Administered 2023-06-09: 10 mg via INTRAVENOUS
  Filled 2023-06-09: qty 1

## 2023-06-09 MED ORDER — PALONOSETRON HCL INJECTION 0.25 MG/5ML
0.2500 mg | Freq: Once | INTRAVENOUS | Status: AC
  Administered 2023-06-09: 0.25 mg via INTRAVENOUS
  Filled 2023-06-09: qty 5

## 2023-06-09 MED ORDER — SODIUM CHLORIDE 0.9% FLUSH
10.0000 mL | Freq: Once | INTRAVENOUS | Status: AC
  Administered 2023-06-09: 10 mL

## 2023-06-09 MED ORDER — DOXORUBICIN HCL CHEMO IV INJECTION 2 MG/ML
60.0000 mg/m2 | Freq: Once | INTRAVENOUS | Status: AC
  Administered 2023-06-09: 112 mg via INTRAVENOUS
  Filled 2023-06-09: qty 56

## 2023-06-09 MED ORDER — HEPARIN SOD (PORK) LOCK FLUSH 100 UNIT/ML IV SOLN
500.0000 [IU] | Freq: Once | INTRAVENOUS | Status: AC | PRN
  Administered 2023-06-09: 500 [IU]

## 2023-06-09 MED ORDER — SODIUM CHLORIDE 0.9 % IV SOLN: INTRAVENOUS | Status: DC

## 2023-06-09 MED ORDER — SODIUM CHLORIDE 0.9 % IV SOLN
600.0000 mg/m2 | Freq: Once | INTRAVENOUS | Status: AC
  Administered 2023-06-09: 1120 mg via INTRAVENOUS
  Filled 2023-06-09: qty 56

## 2023-06-09 MED ORDER — SODIUM CHLORIDE 0.9% FLUSH
10.0000 mL | INTRAVENOUS | Status: DC | PRN
  Administered 2023-06-09: 10 mL

## 2023-06-09 MED ORDER — SODIUM CHLORIDE 0.9 % IV SOLN
150.0000 mg | Freq: Once | INTRAVENOUS | Status: AC
  Administered 2023-06-09: 150 mg via INTRAVENOUS
  Filled 2023-06-09: qty 5

## 2023-06-09 NOTE — Patient Instructions (Signed)
[facility] - A DEPT OF [facility]  Discharge Instructions: Thank you for choosing [facility] to provide your oncology and hematology care.   If you have a lab appointment with the Cancer Center, please go directly to the Cancer Center and check in at the registration area.   Wear comfortable clothing and clothing appropriate for easy access to any Portacath or PICC line.   We strive to give you quality time with your provider. You may need to reschedule your appointment if you arrive late (15 or more minutes).  Arriving late affects you and other patients whose appointments are after yours.  Also, if you miss three or more appointments without notifying the office, you may be dismissed from the clinic at the provider's discretion.      For prescription refill requests, have your pharmacy contact our office and allow 72 hours for refills to be completed.    Today you received the following chemotherapy and/or immunotherapy agents: Adriamycin/Cytoxan      To help prevent nausea and vomiting after your treatment, we encourage you to take your nausea medication as directed.  BELOW ARE SYMPTOMS THAT SHOULD BE REPORTED IMMEDIATELY: *FEVER GREATER THAN 100.4 F (38 C) OR HIGHER *CHILLS OR SWEATING *NAUSEA AND VOMITING THAT IS NOT CONTROLLED WITH YOUR NAUSEA MEDICATION *UNUSUAL SHORTNESS OF BREATH *UNUSUAL BRUISING OR BLEEDING *URINARY PROBLEMS (pain or burning when urinating, or frequent urination) *BOWEL PROBLEMS (unusual diarrhea, constipation, pain near the anus) TENDERNESS IN MOUTH AND THROAT WITH OR WITHOUT PRESENCE OF ULCERS (sore throat, sores in mouth, or a toothache) UNUSUAL RASH, SWELLING OR PAIN  UNUSUAL VAGINAL DISCHARGE OR ITCHING   Items with * indicate a potential emergency and should be followed up as soon as possible or go to the Emergency Department if any problems should occur.  Please show the CHEMOTHERAPY ALERT CARD or  IMMUNOTHERAPY ALERT CARD at check-in to the Emergency Department and triage nurse.  Should you have questions after your visit or need to cancel or reschedule your appointment, please contact [facility] - A DEPT OF [facility]  Dept: [phone]  and follow the prompts.  Office hours are 8:00 a.m. to 4:30 p.m. Monday - Friday. Please note that voicemails left after 4:00 p.m. may not be returned until the following business day.  We are closed weekends and major holidays. You have access to a nurse at all times for urgent questions. Please call the main number to the clinic Dept: [phone] and follow the prompts.   For any non-urgent questions, you may also contact your provider using MyChart. We now offer e-Visits for anyone 70 and older to request care online for non-urgent symptoms. For details visit [email or web address].   Also download the MyChart app! Go to the app store, search "MyChart", open the app, select [place], and log in with your MyChart username and password.

## 2023-06-09 NOTE — Progress Notes (Signed)
[facility] CONSULT NOTE  Patient Care Team: [name], NP as PCP - General (Nurse Practitioner) Donnelly Angelica, RN as Oncology Nurse Navigator Pershing Proud, RN as Oncology Nurse Navigator Almond Lint, MD as Consulting Physician (General Surgery) Rachel Moulds, MD as Consulting Physician (Hematology and Oncology) Dorothy Puffer, MD as Consulting Physician (Radiation Oncology)  CHIEF COMPLAINTS/PURPOSE OF CONSULTATION:  Newly diagnosed breast cancer  HISTORY OF PRESENTING ILLNESS:  Brittany [name] 68 y.o. female is here because of recent diagnosis of right breast cancer  I reviewed her records extensively and collaborated the history with the patient.  SUMMARY OF ONCOLOGIC HISTORY: Oncology History  Malignant neoplasm of upper-outer quadrant of right breast in female, estrogen receptor positive (HCC)  02/22/2023 Mammogram   Screening mammogram on February 22, 2023 showed possible mass in the right breast with calcifications warranting further evaluation.  No findings suspicious for malignancy in the left breast.  Diagnostic mammogram done confirmed a persistent irregular mass within the central right breast, adjacent to the mass are slightly heterogeneous calcifications.  Ultrasound showed 1.1 x 1.2 x 1.3 cm irregular hypoechoic mass at 10 o'clock position of the right breast 1 cm from the nipple, no abnormal right axillary lymph nodes are noted.   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  Prognostic showed ER 100% positive strong staining PR 95% positive strong staining Ki-67 of 5% and HER2 2+   03/14/2023 Initial Diagnosis   Malignant neoplasm of upper-outer quadrant of right breast in female, estrogen receptor positive (HCC)    Genetic Testing   [facility] CancerNext-Expanded Panel+RNA was Negative. Report date is  03/27/2023.   The CancerNext-Expanded gene panel offered by [facility] and includes sequencing, rearrangement, and RNA analysis for the following 71 genes: AIP, ALK, APC, ATM, AXIN2, BAP1, BARD1, BMPR1A, BRCA1, BRCA2, BRIP1, CDC73, CDH1, CDK4, CDKN1B, CDKN2A, CHEK2, CTNNA1, DICER1, FH, FLCN, KIF1B, LZTR1, MAX, MEN1, MET, MLH1, MSH2, MSH3, MSH6, MUTYH, NF1, NF2, NTHL1, PALB2, PHOX2B, PMS2, POT1, PRKAR1A, PTCH1, PTEN, RAD51C, RAD51D, RB1, RET, SDHA, SDHAF2, SDHB, SDHC, SDHD, SMAD4, SMARCA4, SMARCB1, SMARCE1, STK11, SUFU, TMEM127, TP53, TSC1, TSC2, and VHL (sequencing and deletion/duplication); EGFR, EGLN1, HOXB13, KIT, MITF, PDGFRA, POLD1, and POLE (sequencing only); EPCAM and GREM1 (deletion/duplication only).    05/31/2023 Cancer Staging   Staging form: Breast, AJCC 8th Edition - Pathologic stage from 05/31/2023: Stage IB (pT1c, pN2a, cM0, G2, ER+, PR+, HER2-) - Signed by Rachel Moulds, MD on 05/31/2023 Stage prefix: Initial diagnosis Method of lymph node assessment: Axillary lymph node dissection Histologic grading system: 3 grade system   06/09/2023 -  Chemotherapy   Patient is on Treatment Plan : BREAST DOSE DENSE AC q14d / PACLitaxel q7d      Discussed the use of AI scribe software for clinical note transcription with the patient, who gave verbal consent to proceed.  History of Present Illness    The patient, with a history of breast cancer, presents for a follow-up after recent surgery.  The patient presents for chemotherapy initiation and management of post-surgical fluid accumulation. The incision is healing well, she had her drain removed and has clear oozing from the site.  Other than the drainage, she denies any other new complaints today.  No fevers, no chills.  She has been taking some milk thistle since she noticed some mild transaminitis on her last labs. Rest of  the pertinent 10 point ROS reviewed and negative  MEDICAL HISTORY:  Past Medical History:  Diagnosis Date    Anxiety and depression    Elevated LDL cholesterol level    Elevated liver enzymes 07/23/2021   GERD (gastroesophageal reflux disease)    Malignant neoplasm of breast (female) (HCC)    Thyroid disorder 11/14/2017    SURGICAL HISTORY: Past Surgical History:  Procedure Laterality Date   AXILLARY LYMPH NODE DISSECTION Right 05/18/2023   Procedure: RIGHT AXILLARY LYMPH NODE DISSECTION;  Surgeon: Almond Lint, MD;  Location: [facility] OR;  Service: General;  Laterality: Right;  PEC BLOCK   BREAST BIOPSY Right 03/09/2023   [place] RT BREAST BX W LOC DEV 1ST LESION IMG BX SPEC US GUIDE 03/09/2023 [facility] MAMMOGRAPHY   BREAST BIOPSY  04/12/2023   [place] RT RADIOACTIVE SEED LOC 04/12/2023 [facility] ULTRASOUND   BREAST LUMPECTOMY WITH RADIOACTIVE SEED AND SENTINEL LYMPH NODE BIOPSY Right 04/13/2023   Procedure: RIGHT BREAST SEED LOCALIZED LUMPECTOMY WITH SENTINEL NODE BIOPSY;  Surgeon: Almond Lint, MD;  Location: [facility];  Service: General;  Laterality: Right;   HERNIA REPAIR     LAPAROSCOPIC HYSTERECTOMY  03/2017   Partial, pt still has both adnexa   PORTACATH PLACEMENT N/A 05/18/2023   Procedure: PORT PLACEMENT WITH ULTRASOUND GUIDANCE;  Surgeon: Almond Lint, MD;  Location: [facility] OR;  Service: General;  Laterality: N/A;    SOCIAL HISTORY: Social History   Socioeconomic History   Marital status: Divorced    Spouse name: Not on file   Number of children: Not on file   Years of education: Not on file   Highest education level: Master's degree (e.g., MA, MS, MEng, MEd, MSW, MBA)  Occupational History   Not on file  Tobacco Use   Smoking status: Former   Smokeless tobacco: Never   Tobacco comments:    stopped at age 63. smoked for 4-5 years 1 ppd  Vaping Use   Vaping status: Never Used  Substance and Sexual Activity   Alcohol use: Yes    Comment: WINE OCC   Drug use: Never   Sexual activity: Not Currently    Partners: Male    Birth control/protection: Surgical    Comment: hysterectomy   Other Topics Concern   Not on file  Social History Narrative   Not on file   Social Drivers of Health   Financial Resource Strain: Low Risk  (01/24/2023)   Overall Financial Resource Strain (CARDIA)    Difficulty of Paying Living Expenses: Not very hard  Food Insecurity: No Food Insecurity (03/16/2023)   Hunger Vital Sign    Worried About Running Out of Food in the Last Year: Never true    Ran Out of Food in the Last Year: Never true  Transportation Needs: No Transportation Needs (03/16/2023)   PRAPARE - Administrator, Civil Service (Medical): No    Lack of Transportation (Non-Medical): No  Physical Activity: Sufficiently Active (01/24/2023)   Exercise Vital Sign    Days of Exercise per Week: 3 days    Minutes of Exercise per Session: 120 min  Stress: Stress Concern Present (01/24/2023)   [employer] of Occupational Health - Occupational Stress Questionnaire    Feeling of Stress : To some extent  Social Connections: Unknown (01/24/2023)   Social Connection and Isolation Panel [NHANES]    Frequency of Communication with Friends and Family: More than three times a week    Frequency of Social Gatherings with Friends and Family:  Twice a week    Attends Religious Services: Patient declined    Active Member of Clubs or Organizations: No    Attends Banker Meetings: Not on file    Marital Status: Divorced  Intimate Partner Violence: Not At Risk (03/16/2023)   Humiliation, Afraid, Rape, and Kick questionnaire    Fear of Current or Ex-Partner: No    Emotionally Abused: No    Physically Abused: No    Sexually Abused: No    FAMILY HISTORY: Family History  Problem Relation Age of Onset   Breast cancer Mother 44   Diabetes Mother    Hypertension Mother    Cancer Father 82 - 55       liver?   Diabetes Brother    Cancer Paternal Uncle    Breast cancer Paternal Grandmother 46    ALLERGIES:  is allergic to wellbutrin [bupropion].  MEDICATIONS:   Current Outpatient Medications  Medication Sig Dispense Refill   ALPHA LIPOIC ACID PO Take 300 mg by mouth daily.     ASHWAGANDHA PO Take 1 tablet by mouth 2 (two) times a week. (Patient not taking: Reported on 05/26/2023)     Biotin 5000 MCG CAPS Take 1 capsule by mouth daily.     cycloSPORINE (RESTASIS) 0.05 % ophthalmic emulsion Place 1 drop into both eyes 2 (two) times daily.     dexamethasone (DECADRON) 4 MG tablet Take 2 tablets (8 mg total) by mouth daily for 3 days. Start the day after doxorubicin/cyclophosphamide chemotherapy. Take with food. (Patient not taking: Reported on 05/26/2023) 30 tablet 1   EVENING PRIMROSE OIL PO Take 1 tablet by mouth daily.     Flaxseed, Linseed, (FLAX SEED OIL PO) Take 1 capsule by mouth daily.     gabapentin (NEURONTIN) 100 MG capsule Take 100 mg by mouth at bedtime as needed.     glucosamine-chondroitin 500-400 MG tablet Take 1 tablet by mouth daily.     lidocaine-prilocaine (EMLA) cream Apply to affected area once (Patient not taking: Reported on 05/26/2023) 30 g 3   MAGNESIUM GLYCINATE PO Take 350 mg by mouth daily.     methocarbamol (ROBAXIN) 500 MG tablet Take 1 tablet (500 mg total) by mouth every 8 (eight) hours as needed (use for muscle cramps/pain). (Patient not taking: Reported on 05/26/2023) 20 tablet 2   Methylsulfonylmethane (MSM) 1000 MG TABS Take 1 tablet by mouth 3 (three) times a week.     ondansetron (ZOFRAN) 8 MG tablet Take 1 tab (8 mg) by mouth every 8 hrs as needed for nausea/vomiting. Start third day after doxorubicin/cyclophosphamide chemotherapy. (Patient not taking: Reported on 05/26/2023) 30 tablet 1   OVER THE COUNTER MEDICATION Holy basil -   5 times a week     PARoxetine (PAXIL) 10 MG tablet Take 1 tablet (10 mg total) by mouth daily. 30 tablet 1   prochlorperazine (COMPAZINE) 10 MG tablet Take 1 tablet (10 mg total) by mouth every 6 (six) hours as needed for nausea or vomiting. (Patient not taking: Reported on 05/26/2023) 30 tablet  1   pyridoxine (B-6) 100 MG tablet Take 100 mg by mouth daily. (Patient not taking: Reported on 05/26/2023)     vitamin B-12 (CYANOCOBALAMIN) 500 MCG tablet Take 500 mcg by mouth daily. (Patient not taking: Reported on 05/26/2023)     vitamin C (ASCORBIC ACID) 250 MG tablet Take 250 mg by mouth daily. (Patient not taking: Reported on 05/26/2023)     VITAMIN D PO Take 1 tablet by  mouth daily.     No current facility-administered medications for this visit.   Facility-Administered Medications Ordered in Other Visits  Medication Dose Route Frequency Provider Last Rate Last Admin   0.9 %  sodium chloride infusion   Intravenous Continuous Henryk Ursin, Burnice Logan, MD   Stopped at 06/09/23 1535   sodium chloride flush (NS) 0.9 % injection 10 mL  10 mL Intracatheter PRN Rachel Moulds, MD   10 mL at 06/09/23 1534    REVIEW OF SYSTEMS:   Constitutional: Denies fevers, chills or abnormal night sweats Eyes: Denies blurriness of vision, double vision or watery eyes Ears, nose, mouth, throat, and face: Denies mucositis or sore throat Respiratory: Denies cough, dyspnea or wheezes Cardiovascular: Denies palpitation, chest discomfort or lower extremity swelling Gastrointestinal:  Denies nausea, heartburn or change in bowel habits Skin: Denies abnormal skin rashes Lymphatics: Denies new lymphadenopathy or easy bruising Neurological:Denies numbness, tingling or new weaknesses Behavioral/Psych: Mood is stable, no new changes  Breast: Denies any palpable lumps or discharge All other systems were reviewed with the patient and are negative.  PHYSICAL EXAMINATION: ECOG PERFORMANCE STATUS: 0 - Asymptomatic  Vitals:   06/09/23 1225  BP: 126/78  Pulse: 76  Resp: 16  Temp: 97.6 F (36.4 C)  SpO2: 99%    Filed Weights   06/09/23 1225  Weight: 170 lb 11.2 oz (77.4 kg)     GENERAL:alert, no distress and comfortable Right breast drain site noted to have clear oozing and seroma.  No concern for  infection  LABORATORY DATA:  I have reviewed the data as listed Lab Results  Component Value Date   WBC 5.8 06/09/2023   HGB 12.8 06/09/2023   HCT 38.2 06/09/2023   MCV 95.0 06/09/2023   PLT 312 06/09/2023   Lab Results  Component Value Date   NA 140 06/09/2023   K 4.2 06/09/2023   CL 108 06/09/2023   CO2 29 06/09/2023    RADIOGRAPHIC STUDIES: I have personally reviewed the radiological reports and agreed with the findings in the report.  ASSESSMENT AND PLAN:  Malignant neoplasm of upper-outer quadrant of right breast in female, estrogen receptor positive (HCC) This is a very pleasant 68 year old postmenopausal female patient with newly diagnosed right breast invasive lobular carcinoma, ER/PR positive, Ki-67 of 5% HER2 2+ FISH negative referred to breast [facility] for additional recommendations.  She is here now post surgery  Breast Cancer with Lymph Node Involvement Four out of five lymph nodes positive for macrometastasis, since her last visit she had ALND, 5 more LN with metastasis.  Total of 9 lymph nodes involved. Overall staging with no change. PT1cN2a, given the ER positivity and PR positivity, she still is considered a stage Ib and hence we have aborted the idea of staging scans. -Plan for chemotherapy (ACT regimen) due to high risk of developing breast cancer outside the breast.  During her last visit here, she still had an indwelling drain hence we waited for it to be removed.  She apparently had it removed but she continues to have bruising at that site of drainage, she will more than likely have a seroma which will need aspiration. -However it does not appear to be infected and is patient is really anxious to proceed with treatment.  We will proceed with treatment as planned. After chemotherapy, she will be a candidate for radiation and adjuvant CDK 4 6 inhibition with antiestrogen therapy likely for up to 10 years. Unfortunately she is a very high risk of recurrence given her  locally advanced cancer and we have discussed this in the past.  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 -Monitor for worsening of neuropathy during chemotherapy, particularly during second half of treatment.  FU as scheduled.     All questions were answered. The patient knows to call the clinic with any problems, questions or concerns.    Rachel Moulds, MD 06/09/23

## 2023-06-09 NOTE — Assessment & Plan Note (Signed)
This is a very pleasant 68 year old postmenopausal female patient with newly diagnosed right breast invasive lobular carcinoma, ER/PR positive, Ki-67 of 5% HER2 2+ FISH negative referred to breast [facility] for additional recommendations.  She is here now post surgery  Breast Cancer with Lymph Node Involvement Four out of five lymph nodes positive for macrometastasis, since her last visit she had ALND, 5 more LN with metastasis.  Total of 9 lymph nodes involved. Overall staging with no change. PT1cN2a, given the ER positivity and PR positivity, she still is considered a stage Ib and hence we have aborted the idea of staging scans. -Plan for chemotherapy (ACT regimen) due to high risk of developing breast cancer outside the breast.  During her last visit here, she still had an indwelling drain hence we waited for it to be removed.  She apparently had it removed but she continues to have bruising at that site of drainage, she will more than likely have a seroma which will need aspiration. -However it does not appear to be infected and is patient is really anxious to proceed with treatment.  We will proceed with treatment as planned. After chemotherapy, she will be a candidate for radiation and adjuvant CDK 4 6 inhibition with antiestrogen therapy likely for up to 10 years. Unfortunately she is a very high risk of recurrence given her locally advanced cancer and we have discussed this in the past.  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 -Monitor for worsening of neuropathy during chemotherapy, particularly during second half of treatment.  FU as scheduled.

## 2023-06-10 ENCOUNTER — Encounter: Payer: Self-pay | Admitting: Hematology and Oncology

## 2023-06-10 NOTE — Telephone Encounter (Signed)
-----   Message from Nurse Reece Levy sent at 06/09/2023  3:39 PM EST ----- Regarding: Dr. Al Pimple 1st time A/C on 06/09/23 f/u tol well Dr. Al Pimple 1st time Adriamycin/Cytoxan, tolerated well. Pt call back due.

## 2023-06-10 NOTE — Telephone Encounter (Signed)
Chemo call back.  Called pt to see how she did with her recent treatment & she reports doing well.  She took nausea med last night but hasn't needed anything today.  She reports a slight h/a but not enough that she wants to take anything.  She denies any other symptoms & knows her next appts.  Reminded about claritin/tylenol before shot tomorrow.  She has a different antihistamine that MD approved for her to take but will p/u claritin for next injection.  She is taking her steroids.  She is eating and drinking ok but a little less than ususal.  She knows how to reach [place] if needed.

## 2023-06-11 ENCOUNTER — Inpatient Hospital Stay: Payer: Medicare Other | Attending: Hematology and Oncology

## 2023-06-11 VITALS — BP 134/82 | HR 71 | Temp 97.3°F | Resp 16

## 2023-06-11 DIAGNOSIS — Z170 Estrogen receptor positive status [ER+]: Secondary | ICD-10-CM | POA: Diagnosis not present

## 2023-06-11 DIAGNOSIS — Z1731 Human epidermal growth factor receptor 2 positive status: Secondary | ICD-10-CM | POA: Diagnosis not present

## 2023-06-11 DIAGNOSIS — C50411 Malignant neoplasm of upper-outer quadrant of right female breast: Secondary | ICD-10-CM | POA: Insufficient documentation

## 2023-06-11 DIAGNOSIS — R110 Nausea: Secondary | ICD-10-CM | POA: Diagnosis not present

## 2023-06-11 DIAGNOSIS — R5383 Other fatigue: Secondary | ICD-10-CM | POA: Diagnosis not present

## 2023-06-11 DIAGNOSIS — Z5111 Encounter for antineoplastic chemotherapy: Secondary | ICD-10-CM | POA: Insufficient documentation

## 2023-06-11 DIAGNOSIS — K1230 Oral mucositis (ulcerative), unspecified: Secondary | ICD-10-CM | POA: Diagnosis not present

## 2023-06-11 DIAGNOSIS — Z5189 Encounter for other specified aftercare: Secondary | ICD-10-CM | POA: Diagnosis not present

## 2023-06-11 DIAGNOSIS — K5903 Drug induced constipation: Secondary | ICD-10-CM | POA: Diagnosis not present

## 2023-06-11 MED ORDER — PEGFILGRASTIM-FPGK 6 MG/0.6ML ~~LOC~~ SOSY
6.0000 mg | PREFILLED_SYRINGE | Freq: Once | SUBCUTANEOUS | Status: AC
  Administered 2023-06-11: 6 mg via SUBCUTANEOUS

## 2023-06-14 NOTE — Therapy (Signed)
 OUTPATIENT PHYSICAL THERAPY BREAST CANCER POST OP FOLLOW UP   Patient Name: Brittany [name] MRN: [id number] DOB:01-24-1956, 69 y.o., female Today's Date: 06/15/2023  END OF SESSION:  PT End of Session - 06/15/23 1358     Visit Number 3    Number of Visits 3    Date for PT Re-Evaluation 06/15/23    PT Start Time 1400    PT Stop Time 1440    PT Time Calculation (min) 40 min    Activity Tolerance Patient tolerated treatment well    Behavior During Therapy [facility] for tasks assessed/performed             Past Medical History:  Diagnosis Date   Anxiety and depression    Elevated LDL cholesterol level    Elevated liver enzymes 07/23/2021   GERD (gastroesophageal reflux disease)    Malignant neoplasm of breast (female) (HCC)    Thyroid  disorder 11/14/2017   Past Surgical History:  Procedure Laterality Date   AXILLARY LYMPH NODE DISSECTION Right 05/18/2023   Procedure: RIGHT AXILLARY LYMPH NODE DISSECTION;  Surgeon: Aron Shoulders, MD;  Location: [facility];  Service: General;  Laterality: Right;  PEC BLOCK   BREAST BIOPSY Right 03/09/2023   US  RT BREAST BX W LOC DEV 1ST LESION IMG BX SPEC US  GUIDE 03/09/2023 [facility] MAMMOGRAPHY   BREAST BIOPSY  04/12/2023   US  RT RADIOACTIVE SEED LOC 04/12/2023 [facility] ULTRASOUND   BREAST LUMPECTOMY WITH RADIOACTIVE SEED AND SENTINEL LYMPH NODE BIOPSY Right 04/13/2023   Procedure: RIGHT BREAST SEED LOCALIZED LUMPECTOMY WITH SENTINEL NODE BIOPSY;  Surgeon: Aron Shoulders, MD;  Location: [facility];  Service: General;  Laterality: Right;   HERNIA REPAIR     LAPAROSCOPIC HYSTERECTOMY  03/2017   Partial, pt still has both adnexa   PORTACATH PLACEMENT N/A 05/18/2023   Procedure: PORT PLACEMENT WITH ULTRASOUND GUIDANCE;  Surgeon: Aron Shoulders, MD;  Location: [facility] OR;  Service: General;  Laterality: N/A;   Patient Active Problem List   Diagnosis Date Noted   Port-A-Cath in place 05/31/2023   Genetic testing 03/28/2023   Family history of breast  cancer 03/16/2023   Malignant neoplasm of upper-outer quadrant of right breast in female, estrogen receptor positive (HCC) 03/14/2023   Arthralgia of right knee 01/28/2023   Pneumococcal vaccination administered at current visit 01/28/2023   Decreased GFR 12/25/2021   Encounter for general adult medical examination with abnormal findings 07/23/2021   Overweight (BMI 25.0-29.9) 07/23/2021   Hyperlipidemia 07/23/2021   Prediabetes 07/23/2021   Ceruminosis, bilateral 02/18/2021   Vitamin D  deficiency 04/01/2020   Elevated LDL cholesterol level    Anxiety and depression 11/14/2017   Abnormal mammogram with microcalcification 11/14/2017   Thyroid  disorder 11/14/2017     REFERRING PROVIDER: Dr. Shoulders Aron  REFERRING DIAG: Right Breast Cancer  THERAPY DIAG:  Malignant neoplasm of upper-outer quadrant of right breast in female, estrogen receptor positive (HCC)  Abnormal posture  Rationale for Evaluation and Treatment: Rehabilitation  ONSET DATE: 02/22/2023  SUBJECTIVE:  SUBJECTIVE STATEMENT: I think the drain area is healed now without drainage. I started back to some of the exercises and that seems almost normal. She is wearing the compression bra. They delayed her chemo 1 week because the drain was in. She is going back to see surgeon on Friday to be sure no fluid is accumulating. I feel maybe some tiny swelling in the upper arm but more at night and not consistently.  PERTINENT HISTORY:  Patient was diagnosed on 02/22/2023 with right grade 2 invasive ductal carcinoma breast cancer. It measures 1.3 cm and is located in the upper outer quadrant. It is ER/PR positive and HER2 negative with a Ki67 of 5% . She is now s/p Right lumpectomy with SLNB and 4+/5 LN's on 04/13/2023. She  had an ALND on 05/17/2022  with 5+/13 LN'. Her drain was pulled on 06/08/2023 and she has had some clear drainage from a seroma.She will be having chemotherapy, and likely radiation and adjuvant CDK 4 6 inhibition with antiestrogen therapy.  PATIENT GOALS:  Reassess how my recovery is going related to arm function, pain, and swelling.  PAIN:  Are you having pain? No, a little tightness  PRECAUTIONS: Recent Surgery, right UE Lymphedema risk,   RED FLAGS: None   ACTIVITY LEVEL / LEISURE: walking, driving,   OBJECTIVE:   PATIENT SURVEYS:  QUICK DASH: forgotten  OBSERVATIONS: Incisions well healed and pt has been doing scar massage. Drain are with scab present  POSTURE:  Forward head, rounded shoulders   LYMPHEDEMA ASSESSMENT:    UPPER EXTREMITY AROM/PROM:   A/PROM RIGHT   eval   RIGHT 05/10/2023 RIGHT 06/15/2023  Shoulder extension 44 60 62  Shoulder flexion 168 174 173  Shoulder abduction 166 173 178  Shoulder internal rotation 72 70 73  Shoulder external rotation 74 98 97                          (Blank rows = not tested)   A/PROM LEFT   eval  Shoulder extension 52  Shoulder flexion 164  Shoulder abduction 172  Shoulder internal rotation 72  Shoulder external rotation 82                          (Blank rows = not tested)   CERVICAL AROM: All within normal limits   UPPER EXTREMITY STRENGTH: WNL   LYMPHEDEMA ASSESSMENTS (in cm):    LANDMARK RIGHT   eval RIGHT RIGHT 06/15/2023  10 cm proximal to olecranon process 30.3 29.7 29.7  Olecranon process 24.5 24.6 25.9  10 cm proximal to ulnar styloid process 23.2 23.4 23.3  Just proximal to ulnar styloid process 15.7 15.7 15.9  Across hand at thumb web space 19.2 20.3 20.3  At base of 2nd digit 6.4 6.4 6.5  (Blank rows = not tested)   LANDMARK LEFT   eval LEFT 06/15/2023  10 cm proximal to olecranon process 31.4 30  Olecranon process 24.4 24.4  10 cm proximal to ulnar styloid process 22.3 21.6  Just proximal to ulnar styloid process  15.3 15.7  Across hand at thumb web space 18.2 19.3  At base of 2nd digit 6.1 6.45  (Blank rows = not tested)      Surgery type/Date: 04/13/2023 Right Lumpectomy and SLNB, scheduled for ALND on 05/17/2022  Number of lymph nodes removed: 4+/5 first surgery, 2nd sx 5+/13 Current/past treatment (chemo, radiation, hormone therapy): Having chemotherapy and likely  radiation, CDK inhibition, anti estrogens Other symptoms:  Heaviness/tightness No Pain No Pitting edema No Infections No Decreased scar mobility No Stemmer sign No  PATIENT EDUCATION:  Education details: SOZO screens, ABC class, Compression sleeve for prophylactic reasons;repetitive exs or flying, contact us  with questions or concerns Person educated: Patient Education method: Explanationpt was given handouts last session Education comprehension: verbalized understanding  HOME EXERCISE PROGRAM: Reviewed previously given post op HEP.   ASSESSMENT:  CLINICAL IMPRESSION: Pt. s/p Right lumpectomy with SLNB and 4+/5 LN's on 04/13/2023. She  had an ALND on 05/17/2022 with 5+/13 LN'. Her drain was pulled on 06/08/2023 and she has had some clear drainage from a seroma.She will be having chemotherapy, and likely radiation and adjuvant CDK 4 6 inhibition with antiestrogen therapy. She has done an excellent job with ROM and her ROM is WNL. She has no complaints of pain. There is no concern of lymphedema presently however, pt has been reminded to attend the ABC class on Feb. 17,and to keep an eye on her arm to be sure she is not getting any swelling. We discussed a prophylactic compression sleeve for repetitive motion activities and plane travel. She was set up for her 3 month SOZO screen today.There are no cords noted  and pt has no need for further therapy at this time. She should feel free to contact us  with any questions or concerns, but presently she is doing great.  Pt will benefit from skilled therapeutic intervention to improve on the  following deficits: Decreased knowledge of precautions, impaired UE functional use, pain, decreased ROM, postural dysfunction.   PT treatment/interventions: ADL/Self care home management, 97110-Therapeutic exercises and 02464- Self Care   GOALS: Goals reviewed with patient? Yes  LONG TERM GOALS:  (STG=LTG)  GOALS Name Target Date  Goal status  1 Pt will demonstrate she has regained full shoulder ROM and function post operatively compared to baselines.  Baseline: 06/15/2023 MET     PLAN:  PT FREQUENCY/DURATION: No further needs identified at this time  PLAN FOR NEXT SESSION: Pt is discharged at this time, but should contact us  with any questions or concerns. She was set up for the ABC class and SOZO screens.   [facility]  [address]  [place] [address]  [phone]  After Breast Cancer Class It is recommended you attend the ABC class to be educated on lymphedema risk reduction. This class is free of charge and lasts for 1 hour. It is a 1-time class. You will need to download the TEAMS app either on your phone or computer. We will send you a link the night before or the morning of the class. You should be able to click on that link to join the class. This is not a confidential class. You don't have to turn your camera on, but other participants may be able to see your email address.  Scar massage You can begin gentle scar massage to you incision sites. Gently place one hand on the incision and move the skin (without sliding on the skin) in various directions. Do this for a few minutes and then you can gently massage either coconut oil or vitamin E cream into the scars.  Compression garment You should continue wearing your compression bra until you feel like you no longer have swelling.  Home exercise Program Continue doing the exercises you were given until you feel like you can do them without feeling any tightness at the end.   Walking  Program Studies show that 30 minutes of walking per day (fast enough to elevate your heart rate) can significantly reduce the risk of a cancer recurrence. If you can't walk due to other medical reasons, we encourage you to find another activity you could do (like a stationary bike or water exercise).  Posture After breast cancer surgery, people frequently sit with rounded shoulders posture because it puts their incisions on slack and feels better. If you sit like this and scar tissue forms in that position, you can become very tight and have pain sitting or standing with good posture. Try to be aware of your posture and sit and stand up tall to heal properly.  Follow up PT: It is recommended you return every 3 months for the first 3 years following surgery to be assessed on the SOZO machine for an L-Dex score. This helps prevent clinically significant lymphedema in 95% of patients. These follow up screens are 10 minute appointments that you are not billed for.  Grayce JINNY Sheldon, PT 06/15/2023, 2:53 PM

## 2023-06-15 ENCOUNTER — Ambulatory Visit: Payer: Medicare Other | Attending: General Surgery

## 2023-06-15 DIAGNOSIS — Z170 Estrogen receptor positive status [ER+]: Secondary | ICD-10-CM | POA: Insufficient documentation

## 2023-06-15 DIAGNOSIS — C50411 Malignant neoplasm of upper-outer quadrant of right female breast: Secondary | ICD-10-CM | POA: Insufficient documentation

## 2023-06-15 DIAGNOSIS — R293 Abnormal posture: Secondary | ICD-10-CM | POA: Diagnosis present

## 2023-06-16 ENCOUNTER — Other Ambulatory Visit: Payer: Self-pay

## 2023-06-20 NOTE — Progress Notes (Signed)
 Completed FMLA Forms for Patient's Daughter as requested. Fax transmission confirmation received. Copy of forms emailed and placed for pick-up as requested. No other needs or concerns voiced at this time.

## 2023-06-22 ENCOUNTER — Telehealth (INDEPENDENT_AMBULATORY_CARE_PROVIDER_SITE_OTHER): Payer: Medicare Other | Admitting: Nurse Practitioner

## 2023-06-22 DIAGNOSIS — F419 Anxiety disorder, unspecified: Secondary | ICD-10-CM | POA: Diagnosis not present

## 2023-06-22 DIAGNOSIS — F32A Depression, unspecified: Secondary | ICD-10-CM

## 2023-06-22 MED ORDER — PAROXETINE HCL 10 MG PO TABS: 10.0000 mg | ORAL_TABLET | Freq: Every day | ORAL | 1 refills | Status: DC

## 2023-06-22 MED FILL — Fosaprepitant Dimeglumine For IV Infusion 150 MG (Base Eq): INTRAVENOUS | Qty: 5 | Status: AC

## 2023-06-22 NOTE — Progress Notes (Signed)
   Established Patient Office Visit  An audio/visual tele-health visit was completed today for this patient. I connected with  Brittany [name] on 06/22/23 utilizing audio/visual technology and verified that I am speaking with the correct person using two identifiers. The patient was located at their home, and I was located at the office of [facility] during the encounter. I discussed the limitations of evaluation and management by telemedicine. The patient expressed understanding and agreed to proceed.   Estimated Creatinine Clearance: 68.9 mL/min (by C-G formula based on SCr of 0.82 mg/dL).   Subjective   Patient ID: Brittany [name], female    DOB: 1955-08-10  Age: 68 y.o. MRN: [id number]  No chief complaint on file.   Anxiety: Started paxil 10mg  tablets about 3 months ago. Was diagnosed with breast cancer which has exacerbated anxiety.  She reports feeling panicky at times, but since starting the Paxil feels that her mood is much more stabilized and is tolerating this well.  She would like to continue on Paxil 10 mg by mouth daily.  No suicidal or homicidal ideation.    ROS: see HPI    Objective:     There were no vitals taken for this visit.   Physical Exam Comprehensive physical exam not completed today as office visit was conducted remotely.  Patient appears well over video.  Patient was alert and oriented, and appeared to have appropriate judgment.   No results found for any visits on 06/22/23.    The 10-year ASCVD risk score (Arnett DK, et al., 2019) is: 5.7%    Assessment & Plan:   Problem List Items Addressed This Visit       Other   Anxiety and depression - Primary   Chronic Much improved with initiation of Paxil 10 mg daily. Continue Paxil 10 mg by mouth daily.      Relevant Medications   PARoxetine (PAXIL) 10 MG tablet    Return in about 6 months (around 12/20/2023) for F/U with Maralyn Sago.    Brittany Paddy, NP

## 2023-06-22 NOTE — Assessment & Plan Note (Signed)
Chronic Much improved with initiation of Paxil 10 mg daily. Continue Paxil 10 mg by mouth daily.

## 2023-06-23 ENCOUNTER — Other Ambulatory Visit: Payer: Self-pay

## 2023-06-23 ENCOUNTER — Inpatient Hospital Stay: Payer: Medicare Other

## 2023-06-23 ENCOUNTER — Inpatient Hospital Stay (HOSPITAL_BASED_OUTPATIENT_CLINIC_OR_DEPARTMENT_OTHER): Payer: Medicare Other | Admitting: Hematology and Oncology

## 2023-06-23 VITALS — BP 124/72 | HR 74 | Temp 97.7°F | Resp 17 | Wt 168.2 lb

## 2023-06-23 DIAGNOSIS — C50411 Malignant neoplasm of upper-outer quadrant of right female breast: Secondary | ICD-10-CM

## 2023-06-23 DIAGNOSIS — Z170 Estrogen receptor positive status [ER+]: Secondary | ICD-10-CM

## 2023-06-23 DIAGNOSIS — Z5111 Encounter for antineoplastic chemotherapy: Secondary | ICD-10-CM | POA: Diagnosis not present

## 2023-06-23 DIAGNOSIS — Z95828 Presence of other vascular implants and grafts: Secondary | ICD-10-CM

## 2023-06-23 LAB — CMP (CANCER CENTER ONLY)
ALT: 15 U/L (ref 0–44)
AST: 14 U/L — ABNORMAL LOW (ref 15–41)
Albumin: 4.1 g/dL (ref 3.5–5.0)
Alkaline Phosphatase: 91 U/L (ref 38–126)
Anion gap: 4 — ABNORMAL LOW (ref 5–15)
BUN: 12 mg/dL (ref 8–23)
CO2: 27 mmol/L (ref 22–32)
Calcium: 8.9 mg/dL (ref 8.9–10.3)
Chloride: 108 mmol/L (ref 98–111)
Creatinine: 0.8 mg/dL (ref 0.44–1.00)
GFR, Estimated: >60 mL/min (ref >60)
Glucose, Bld: 92 mg/dL (ref 70–99)
Potassium: 4.1 mmol/L (ref 3.5–5.1)
Sodium: 139 mmol/L (ref 135–145)
Total Bilirubin: 0.3 mg/dL (ref 0.0–1.2)
Total Protein: 6.4 g/dL — ABNORMAL LOW (ref 6.5–8.1)

## 2023-06-23 LAB — CBC WITH DIFFERENTIAL (CANCER CENTER ONLY)
Abs Immature Granulocytes: 0.9 10*3/uL — ABNORMAL HIGH (ref 0.00–0.07)
Basophils Absolute: 0 10*3/uL (ref 0.0–0.1)
Basophils Relative: 0 %
Eosinophils Absolute: 0.1 10*3/uL (ref 0.0–0.5)
Eosinophils Relative: 1 %
HCT: 36.1 % (ref 36.0–46.0)
Hemoglobin: 12.3 g/dL (ref 12.0–15.0)
Immature Granulocytes: 10 %
Lymphocytes Relative: 32 %
Lymphs Abs: 3 10*3/uL (ref 0.7–4.0)
MCH: 32.4 pg (ref 26.0–34.0)
MCHC: 34.1 g/dL (ref 30.0–36.0)
MCV: 95 fL (ref 80.0–100.0)
Monocytes Absolute: 0.9 10*3/uL (ref 0.1–1.0)
Monocytes Relative: 9 %
Neutro Abs: 4.6 10*3/uL (ref 1.7–7.7)
Neutrophils Relative %: 48 %
Platelet Count: 266 10*3/uL (ref 150–400)
RBC: 3.8 MIL/uL — ABNORMAL LOW (ref 3.87–5.11)
RDW: 13.3 % (ref 11.5–15.5)
Smear Review: NORMAL
WBC Count: 9.5 10*3/uL (ref 4.0–10.5)
nRBC: 0.5 % — ABNORMAL HIGH (ref 0.0–0.2)

## 2023-06-23 MED ORDER — DEXAMETHASONE SODIUM PHOSPHATE 10 MG/ML IJ SOLN
10.0000 mg | Freq: Once | INTRAMUSCULAR | Status: AC
  Administered 2023-06-23: 10 mg via INTRAVENOUS
  Filled 2023-06-23: qty 1

## 2023-06-23 MED ORDER — SODIUM CHLORIDE 0.9% FLUSH
10.0000 mL | INTRAVENOUS | Status: DC | PRN
  Administered 2023-06-23: 10 mL

## 2023-06-23 MED ORDER — SODIUM CHLORIDE 0.9 % IV SOLN
INTRAVENOUS | Status: DC
Start: 2023-06-23 — End: 2023-06-23

## 2023-06-23 MED ORDER — SODIUM CHLORIDE 0.9 % IV SOLN
600.0000 mg/m2 | Freq: Once | INTRAVENOUS | Status: AC
  Administered 2023-06-23: 1120 mg via INTRAVENOUS
  Filled 2023-06-23: qty 56

## 2023-06-23 MED ORDER — HEPARIN SOD (PORK) LOCK FLUSH 100 UNIT/ML IV SOLN
500.0000 [IU] | Freq: Once | INTRAVENOUS | Status: AC | PRN
  Administered 2023-06-23: 500 [IU]

## 2023-06-23 MED ORDER — DOXORUBICIN HCL CHEMO IV INJECTION 2 MG/ML
60.0000 mg/m2 | Freq: Once | INTRAVENOUS | Status: AC
  Administered 2023-06-23: 112 mg via INTRAVENOUS
  Filled 2023-06-23: qty 56

## 2023-06-23 MED ORDER — SODIUM CHLORIDE 0.9 % IV SOLN
150.0000 mg | Freq: Once | INTRAVENOUS | Status: AC
  Administered 2023-06-23: 150 mg via INTRAVENOUS
  Filled 2023-06-23: qty 150

## 2023-06-23 MED ORDER — SODIUM CHLORIDE 0.9% FLUSH
10.0000 mL | Freq: Once | INTRAVENOUS | Status: AC
  Administered 2023-06-23: 10 mL

## 2023-06-23 MED ORDER — PALONOSETRON HCL INJECTION 0.25 MG/5ML
0.2500 mg | Freq: Once | INTRAVENOUS | Status: AC
  Administered 2023-06-23: 0.25 mg via INTRAVENOUS
  Filled 2023-06-23: qty 5

## 2023-06-23 NOTE — Patient Instructions (Signed)
[facility] - A DEPT OF [facility]  Discharge Instructions: Thank you for choosing [facility] to provide your oncology and hematology care.   If you have a lab appointment with the Cancer Center, please go directly to the Cancer Center and check in at the registration area.   Wear comfortable clothing and clothing appropriate for easy access to any Portacath or PICC line.   We strive to give you quality time with your provider. You may need to reschedule your appointment if you arrive late (15 or more minutes).  Arriving late affects you and other patients whose appointments are after yours.  Also, if you miss three or more appointments without notifying the office, you may be dismissed from the clinic at the provider's discretion.      For prescription refill requests, have your pharmacy contact our office and allow 72 hours for refills to be completed.    Today you received the following chemotherapy and/or immunotherapy agents: Adriamycin/Cytoxan      To help prevent nausea and vomiting after your treatment, we encourage you to take your nausea medication as directed.  BELOW ARE SYMPTOMS THAT SHOULD BE REPORTED IMMEDIATELY: *FEVER GREATER THAN 100.4 F (38 C) OR HIGHER *CHILLS OR SWEATING *NAUSEA AND VOMITING THAT IS NOT CONTROLLED WITH YOUR NAUSEA MEDICATION *UNUSUAL SHORTNESS OF BREATH *UNUSUAL BRUISING OR BLEEDING *URINARY PROBLEMS (pain or burning when urinating, or frequent urination) *BOWEL PROBLEMS (unusual diarrhea, constipation, pain near the anus) TENDERNESS IN MOUTH AND THROAT WITH OR WITHOUT PRESENCE OF ULCERS (sore throat, sores in mouth, or a toothache) UNUSUAL RASH, SWELLING OR PAIN  UNUSUAL VAGINAL DISCHARGE OR ITCHING   Items with * indicate a potential emergency and should be followed up as soon as possible or go to the Emergency Department if any problems should occur.  Please show the CHEMOTHERAPY ALERT CARD or  IMMUNOTHERAPY ALERT CARD at check-in to the Emergency Department and triage nurse.  Should you have questions after your visit or need to cancel or reschedule your appointment, please contact [facility] - A DEPT OF [facility]  Dept: [phone]  and follow the prompts.  Office hours are 8:00 a.m. to 4:30 p.m. Monday - Friday. Please note that voicemails left after 4:00 p.m. may not be returned until the following business day.  We are closed weekends and major holidays. You have access to a nurse at all times for urgent questions. Please call the main number to the clinic Dept: [phone] and follow the prompts.   For any non-urgent questions, you may also contact your provider using MyChart. We now offer e-Visits for anyone 67 and older to request care online for non-urgent symptoms. For details visit [email or web address].   Also download the MyChart app! Go to the app store, search "MyChart", open the app, select [place], and log in with your MyChart username and password.

## 2023-06-23 NOTE — Progress Notes (Signed)
 [facility] CONSULT NOTE  Patient Care Team: [name], NP as PCP - General (Nurse Practitioner) Donnelly Angelica, RN as Oncology Nurse Navigator Pershing Proud, RN as Oncology Nurse Navigator Almond Lint, MD as Consulting Physician (General Surgery) Rachel Moulds, MD as Consulting Physician (Hematology and Oncology) Dorothy Puffer, MD as Consulting Physician (Radiation Oncology)  CHIEF COMPLAINTS/PURPOSE OF CONSULTATION:  Newly diagnosed breast cancer  HISTORY OF PRESENTING ILLNESS:  Brittany [name] 68 y.o. female is here because of recent diagnosis of right breast cancer  I reviewed her records extensively and collaborated the history with the patient.  SUMMARY OF ONCOLOGIC HISTORY: Oncology History  Malignant neoplasm of upper-outer quadrant of right breast in female, estrogen receptor positive (HCC)  02/22/2023 Mammogram   Screening mammogram on February 22, 2023 showed possible mass in the right breast with calcifications warranting further evaluation.  No findings suspicious for malignancy in the left breast.  Diagnostic mammogram done confirmed a persistent irregular mass within the central right breast, adjacent to the mass are slightly heterogeneous calcifications.  Ultrasound showed 1.1 x 1.2 x 1.3 cm irregular hypoechoic mass at 10 o'clock position of the right breast 1 cm from the nipple, no abnormal right axillary lymph nodes are noted.   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  Prognostic showed ER 100% positive strong staining PR 95% positive strong staining Ki-67 of 5% and HER2 2+   03/14/2023 Initial Diagnosis   Malignant neoplasm of upper-outer quadrant of right breast in female, estrogen receptor positive (HCC)    Genetic Testing   [facility] CancerNext-Expanded Panel+RNA was Negative. Report date is  03/27/2023.   The CancerNext-Expanded gene panel offered by [facility] and includes sequencing, rearrangement, and RNA analysis for the following 71 genes: AIP, ALK, APC, ATM, AXIN2, BAP1, BARD1, BMPR1A, BRCA1, BRCA2, BRIP1, CDC73, CDH1, CDK4, CDKN1B, CDKN2A, CHEK2, CTNNA1, DICER1, FH, FLCN, KIF1B, LZTR1, MAX, MEN1, MET, MLH1, MSH2, MSH3, MSH6, MUTYH, NF1, NF2, NTHL1, PALB2, PHOX2B, PMS2, POT1, PRKAR1A, PTCH1, PTEN, RAD51C, RAD51D, RB1, RET, SDHA, SDHAF2, SDHB, SDHC, SDHD, SMAD4, SMARCA4, SMARCB1, SMARCE1, STK11, SUFU, TMEM127, TP53, TSC1, TSC2, and VHL (sequencing and deletion/duplication); EGFR, EGLN1, HOXB13, KIT, MITF, PDGFRA, POLD1, and POLE (sequencing only); EPCAM and GREM1 (deletion/duplication only).    05/31/2023 Cancer Staging   Staging form: Breast, AJCC 8th Edition - Pathologic stage from 05/31/2023: Stage IB (pT1c, pN2a, cM0, G2, ER+, PR+, HER2-) - Signed by Rachel Moulds, MD on 05/31/2023 Stage prefix: Initial diagnosis Method of lymph node assessment: Axillary lymph node dissection Histologic grading system: 3 grade system   06/09/2023 -  Chemotherapy   Patient is on Treatment Plan : BREAST DOSE DENSE AC q14d / PACLitaxel q7d      Discussed the use of AI scribe software for clinical note transcription with the patient, who gave verbal consent to proceed.  History of Present Illness    The patient, with a history of breast cancer, presents for a follow-up after recent surgery.  Discussed the use of AI scribe software for clinical note transcription with the patient, who gave verbal consent to proceed.  History of Present Illness    Brittany [name] is a 68 year old female undergoing chemotherapy who presents for follow-up regarding treatment side effects.  She experiences queasiness and mild fatigue around day five or six after receiving her chemotherapy injection. These symptoms  are mild and do not require consistent medication, only taking her nausea medicine,  Compazine, a couple of times as needed. By day seven, she feels she starts to recover. No significant swelling or mouth sores, only a 'teeny bit' of mouth discomfort managed with baking soda and salt water rinses.  Her axilla, which was previously healing well, has developed a small amount of fluid accumulation over the last couple of days. It feels soft to [name] but can feel firmer upon pressing. Drain site healed well.  She is currently taking B12, B6, folic acid, and vitamin C regularly. She uses milk thistle and dandelion tea as needed for liver health. She has discontinued ashwagandha, gabapentin, and alpha lipoic acid. She is not taking methocarbamol (Robaxin) as previously noted in her records.  Rest of the pertinent 10 point ROS reviewed and negative  MEDICAL HISTORY:  Past Medical History:  Diagnosis Date   Anxiety and depression    Elevated LDL cholesterol level    Elevated liver enzymes 07/23/2021   GERD (gastroesophageal reflux disease)    Malignant neoplasm of breast (female) (HCC)    Thyroid disorder 11/14/2017    SURGICAL HISTORY: Past Surgical History:  Procedure Laterality Date   AXILLARY LYMPH NODE DISSECTION Right 05/18/2023   Procedure: RIGHT AXILLARY LYMPH NODE DISSECTION;  Surgeon: Almond Lint, MD;  Location: [facility] OR;  Service: General;  Laterality: Right;  PEC BLOCK   BREAST BIOPSY Right 03/09/2023   [place] RT BREAST BX W LOC DEV 1ST LESION IMG BX SPEC US GUIDE 03/09/2023 [facility] MAMMOGRAPHY   BREAST BIOPSY  04/12/2023   [place] RT RADIOACTIVE SEED LOC 04/12/2023 [facility] ULTRASOUND   BREAST LUMPECTOMY WITH RADIOACTIVE SEED AND SENTINEL LYMPH NODE BIOPSY Right 04/13/2023   Procedure: RIGHT BREAST SEED LOCALIZED LUMPECTOMY WITH SENTINEL NODE BIOPSY;  Surgeon: Almond Lint, MD;  Location: [facility];  Service: General;  Laterality: Right;   HERNIA REPAIR     LAPAROSCOPIC HYSTERECTOMY  03/2017   Partial, pt still has both adnexa   PORTACATH PLACEMENT N/A 05/18/2023    Procedure: PORT PLACEMENT WITH ULTRASOUND GUIDANCE;  Surgeon: Almond Lint, MD;  Location: [facility] OR;  Service: General;  Laterality: N/A;    SOCIAL HISTORY: Social History   Socioeconomic History   Marital status: Divorced    Spouse name: Not on file   Number of children: Not on file   Years of education: Not on file   Highest education level: Master's degree (e.g., MA, MS, MEng, MEd, MSW, MBA)  Occupational History   Not on file  Tobacco Use   Smoking status: Former   Smokeless tobacco: Never   Tobacco comments:    stopped at age 61. smoked for 4-5 years 1 ppd  Vaping Use   Vaping status: Never Used  Substance and Sexual Activity   Alcohol use: Yes    Comment: WINE OCC   Drug use: Never   Sexual activity: Not Currently    Partners: Male    Birth control/protection: Surgical    Comment: hysterectomy  Other Topics Concern   Not on file  Social History Narrative   Not on file   Social Drivers of Health   Financial Resource Strain: Low Risk  (06/20/2023)   Overall Financial Resource Strain (CARDIA)    Difficulty of Paying Living Expenses: Not very hard  Food Insecurity: No Food Insecurity (06/20/2023)   Hunger Vital Sign    Worried About Running Out of Food in the Last Year: Never true  Ran Out of Food in the Last Year: Never true  Transportation Needs: No Transportation Needs (06/20/2023)   PRAPARE - Administrator, Civil Service (Medical): No    Lack of Transportation (Non-Medical): No  Physical Activity: Insufficiently Active (06/20/2023)   Exercise Vital Sign    Days of Exercise per Week: 3 days    Minutes of Exercise per Session: 30 min  Stress: Stress Concern Present (06/20/2023)   [employer] of Occupational Health - Occupational Stress Questionnaire    Feeling of Stress : To some extent  Social Connections: Unknown (06/20/2023)   Social Connection and Isolation Panel [NHANES]    Frequency of Communication with Friends and Family: More than  three times a week    Frequency of Social Gatherings with Friends and Family: Once a week    Attends Religious Services: Patient declined    Database administrator or Organizations: No    Attends Engineer, structural: Not on file    Marital Status: Divorced  Intimate Partner Violence: Not At Risk (03/16/2023)   Humiliation, Afraid, Rape, and Kick questionnaire    Fear of Current or Ex-Partner: No    Emotionally Abused: No    Physically Abused: No    Sexually Abused: No    FAMILY HISTORY: Family History  Problem Relation Age of Onset   Breast cancer Mother 6   Diabetes Mother    Hypertension Mother    Cancer Father 76 - 9       liver?   Diabetes Brother    Cancer Paternal Uncle    Breast cancer Paternal Grandmother 1    ALLERGIES:  is allergic to wellbutrin [bupropion].  MEDICATIONS:  Current Outpatient Medications  Medication Sig Dispense Refill   milk thistle 175 MG tablet Take 175 mg by mouth daily.     Biotin 5000 MCG CAPS Take 1 capsule by mouth daily.     cycloSPORINE (RESTASIS) 0.05 % ophthalmic emulsion Place 1 drop into both eyes 2 (two) times daily.     dexamethasone (DECADRON) 4 MG tablet Take 2 tablets (8 mg total) by mouth daily for 3 days. Start the day after doxorubicin/cyclophosphamide chemotherapy. Take with food. (Patient not taking: Reported on 05/26/2023) 30 tablet 1   EVENING PRIMROSE OIL PO Take 1 tablet by mouth daily.     Flaxseed, Linseed, (FLAX SEED OIL PO) Take 1 capsule by mouth daily.     glucosamine-chondroitin 500-400 MG tablet Take 1 tablet by mouth daily.     lidocaine-prilocaine (EMLA) cream Apply to affected area once (Patient not taking: Reported on 05/26/2023) 30 g 3   MAGNESIUM GLYCINATE PO Take 350 mg by mouth daily.     Methylsulfonylmethane (MSM) 1000 MG TABS Take 1 tablet by mouth 3 (three) times a week.     ondansetron (ZOFRAN) 8 MG tablet Take 1 tab (8 mg) by mouth every 8 hrs as needed for nausea/vomiting. Start third day  after doxorubicin/cyclophosphamide chemotherapy. (Patient not taking: Reported on 05/26/2023) 30 tablet 1   OVER THE COUNTER MEDICATION Holy basil -   5 times a week     PARoxetine (PAXIL) 10 MG tablet Take 1 tablet (10 mg total) by mouth daily. 90 tablet 1   prochlorperazine (COMPAZINE) 10 MG tablet Take 1 tablet (10 mg total) by mouth every 6 (six) hours as needed for nausea or vomiting. (Patient not taking: Reported on 05/26/2023) 30 tablet 1   pyridoxine (B-6) 100 MG tablet Take 100 mg by mouth  daily. (Patient not taking: Reported on 05/26/2023)     vitamin B-12 (CYANOCOBALAMIN) 500 MCG tablet Take 500 mcg by mouth daily. (Patient not taking: Reported on 05/26/2023)     vitamin C (ASCORBIC ACID) 250 MG tablet Take 250 mg by mouth daily. (Patient not taking: Reported on 05/26/2023)     VITAMIN D PO Take 1 tablet by mouth daily.     No current facility-administered medications for this visit.   Facility-Administered Medications Ordered in Other Visits  Medication Dose Route Frequency Provider Last Rate Last Admin   0.9 %  sodium chloride infusion   Intravenous Continuous Glenys Snader, Burnice Logan, MD   Stopped at 06/23/23 1353   sodium chloride flush (NS) 0.9 % injection 10 mL  10 mL Intracatheter PRN Rachel Moulds, MD   10 mL at 06/23/23 1351    REVIEW OF SYSTEMS:   Constitutional: Denies fevers, chills or abnormal night sweats Eyes: Denies blurriness of vision, double vision or watery eyes Ears, nose, mouth, throat, and face: Denies mucositis or sore throat Respiratory: Denies cough, dyspnea or wheezes Cardiovascular: Denies palpitation, chest discomfort or lower extremity swelling Gastrointestinal:  Denies nausea, heartburn or change in bowel habits Skin: Denies abnormal skin rashes Lymphatics: Denies new lymphadenopathy or easy bruising Neurological:Denies numbness, tingling or new weaknesses Behavioral/Psych: Mood is stable, no new changes  Breast: Denies any palpable lumps or discharge All  other systems were reviewed with the patient and are negative.  PHYSICAL EXAMINATION: ECOG PERFORMANCE STATUS: 0 - Asymptomatic  Vitals:   06/23/23 1020  BP: 124/72  Pulse: 74  Resp: 17  Temp: 97.7 F (36.5 C)  SpO2: 97%    Filed Weights   06/23/23 1020  Weight: 168 lb 3.2 oz (76.3 kg)   GENERAL:alert, no distress and comfortable Right breast healing well, axillary seroma noted. Chest: CTA bilaterally Heart: RRR No LE swelling  LABORATORY DATA:  I have reviewed the data as listed Lab Results  Component Value Date   WBC 9.5 06/23/2023   HGB 12.3 06/23/2023   HCT 36.1 06/23/2023   MCV 95.0 06/23/2023   PLT 266 06/23/2023   Lab Results  Component Value Date   NA 139 06/23/2023   K 4.1 06/23/2023   CL 108 06/23/2023   CO2 27 06/23/2023    RADIOGRAPHIC STUDIES: I have personally reviewed the radiological reports and agreed with the findings in the report.  ASSESSMENT AND PLAN:  Malignant neoplasm of upper-outer quadrant of right breast in female, estrogen receptor positive (HCC) Post-chemotherapy recovery Tolerated well with minimal side effects. Experienced mild queasiness and fatigue around day five or six, but did not require frequent use of antiemetic medication. -Continue current management and supportive care.  Post-surgical seroma Noted a small amount of fluid accumulation at the surgical site. No signs of infection or significant discomfort. -Continue self-massage and monitor for changes.  General Health Maintenance Acknowledged need for increased physical activity. -Increase physical activity as tolerated.  Follow-up in a couple of weeks to monitor recovery and address any new concerns.  All questions were answered. The patient knows to call the clinic with any problems, questions or concerns.    Rachel Moulds, MD 06/23/23

## 2023-06-23 NOTE — Assessment & Plan Note (Signed)
Post-chemotherapy recovery Tolerated well with minimal side effects. Experienced mild queasiness and fatigue around day five or six, but did not require frequent use of antiemetic medication. -Continue current management and supportive care.  Post-surgical seroma Noted a small amount of fluid accumulation at the surgical site. No signs of infection or significant discomfort. -Continue self-massage and monitor for changes.  General Health Maintenance Acknowledged need for increased physical activity. -Increase physical activity as tolerated.  Follow-up in a couple of weeks to monitor recovery and address any new concerns.

## 2023-06-25 ENCOUNTER — Inpatient Hospital Stay: Payer: Medicare Other

## 2023-06-25 VITALS — BP 110/59 | HR 70 | Temp 97.5°F | Resp 17

## 2023-06-25 DIAGNOSIS — Z170 Estrogen receptor positive status [ER+]: Secondary | ICD-10-CM

## 2023-06-25 DIAGNOSIS — Z5111 Encounter for antineoplastic chemotherapy: Secondary | ICD-10-CM | POA: Diagnosis not present

## 2023-06-25 MED ORDER — PEGFILGRASTIM-FPGK 6 MG/0.6ML ~~LOC~~ SOSY
6.0000 mg | PREFILLED_SYRINGE | Freq: Once | SUBCUTANEOUS | Status: AC
  Administered 2023-06-25: 6 mg via SUBCUTANEOUS
  Filled 2023-06-25: qty 0.6

## 2023-07-06 MED FILL — Fosaprepitant Dimeglumine For IV Infusion 150 MG (Base Eq): INTRAVENOUS | Qty: 5 | Status: AC

## 2023-07-07 ENCOUNTER — Inpatient Hospital Stay: Payer: Medicare Other

## 2023-07-07 ENCOUNTER — Inpatient Hospital Stay (HOSPITAL_BASED_OUTPATIENT_CLINIC_OR_DEPARTMENT_OTHER): Payer: Medicare Other | Admitting: Hematology and Oncology

## 2023-07-07 VITALS — BP 120/65 | HR 88 | Temp 97.6°F | Resp 16 | Wt 170.4 lb

## 2023-07-07 VITALS — BP 123/77 | HR 80 | Temp 98.1°F | Resp 20

## 2023-07-07 DIAGNOSIS — Z170 Estrogen receptor positive status [ER+]: Secondary | ICD-10-CM

## 2023-07-07 DIAGNOSIS — C50411 Malignant neoplasm of upper-outer quadrant of right female breast: Secondary | ICD-10-CM | POA: Diagnosis not present

## 2023-07-07 DIAGNOSIS — Z95828 Presence of other vascular implants and grafts: Secondary | ICD-10-CM

## 2023-07-07 DIAGNOSIS — Z5111 Encounter for antineoplastic chemotherapy: Secondary | ICD-10-CM | POA: Diagnosis not present

## 2023-07-07 LAB — CMP (CANCER CENTER ONLY)
ALT: 15 U/L (ref 0–44)
AST: 15 U/L (ref 15–41)
Albumin: 4 g/dL (ref 3.5–5.0)
Alkaline Phosphatase: 102 U/L (ref 38–126)
Anion gap: 3 — ABNORMAL LOW (ref 5–15)
BUN: 11 mg/dL (ref 8–23)
CO2: 29 mmol/L (ref 22–32)
Calcium: 9.1 mg/dL (ref 8.9–10.3)
Chloride: 108 mmol/L (ref 98–111)
Creatinine: 0.94 mg/dL (ref 0.44–1.00)
GFR, Estimated: >60 mL/min (ref >60)
Glucose, Bld: 91 mg/dL (ref 70–99)
Potassium: 4 mmol/L (ref 3.5–5.1)
Sodium: 140 mmol/L (ref 135–145)
Total Bilirubin: 0.2 mg/dL (ref 0.0–1.2)
Total Protein: 6.3 g/dL — ABNORMAL LOW (ref 6.5–8.1)

## 2023-07-07 LAB — CBC WITH DIFFERENTIAL (CANCER CENTER ONLY)
Abs Immature Granulocytes: 1.14 10*3/uL — ABNORMAL HIGH (ref 0.00–0.07)
Basophils Absolute: 0.1 10*3/uL (ref 0.0–0.1)
Basophils Relative: 1 %
Eosinophils Absolute: 0 10*3/uL (ref 0.0–0.5)
Eosinophils Relative: 0 %
HCT: 33 % — ABNORMAL LOW (ref 36.0–46.0)
Hemoglobin: 11.1 g/dL — ABNORMAL LOW (ref 12.0–15.0)
Immature Granulocytes: 9 %
Lymphocytes Relative: 17 %
Lymphs Abs: 2.3 10*3/uL (ref 0.7–4.0)
MCH: 32.3 pg (ref 26.0–34.0)
MCHC: 33.6 g/dL (ref 30.0–36.0)
MCV: 95.9 fL (ref 80.0–100.0)
Monocytes Absolute: 1.2 10*3/uL — ABNORMAL HIGH (ref 0.1–1.0)
Monocytes Relative: 9 %
Neutro Abs: 8.5 10*3/uL — ABNORMAL HIGH (ref 1.7–7.7)
Neutrophils Relative %: 64 %
Platelet Count: 252 10*3/uL (ref 150–400)
RBC: 3.44 MIL/uL — ABNORMAL LOW (ref 3.87–5.11)
RDW: 13.9 % (ref 11.5–15.5)
WBC Count: 13.3 10*3/uL — ABNORMAL HIGH (ref 4.0–10.5)
nRBC: 0.8 % — ABNORMAL HIGH (ref 0.0–0.2)

## 2023-07-07 MED ORDER — HEPARIN SOD (PORK) LOCK FLUSH 100 UNIT/ML IV SOLN
500.0000 [IU] | Freq: Once | INTRAVENOUS | Status: AC | PRN
  Administered 2023-07-07: 500 [IU]

## 2023-07-07 MED ORDER — DOXORUBICIN HCL CHEMO IV INJECTION 2 MG/ML
60.0000 mg/m2 | Freq: Once | INTRAVENOUS | Status: AC
Start: 2023-07-07 — End: 2023-07-07
  Administered 2023-07-07: 112 mg via INTRAVENOUS
  Filled 2023-07-07: qty 56

## 2023-07-07 MED ORDER — SODIUM CHLORIDE 0.9 % IV SOLN
INTRAVENOUS | Status: DC
Start: 2023-07-07 — End: 2023-07-07

## 2023-07-07 MED ORDER — SODIUM CHLORIDE 0.9% FLUSH
10.0000 mL | INTRAVENOUS | Status: DC | PRN
  Administered 2023-07-07: 10 mL

## 2023-07-07 MED ORDER — SODIUM CHLORIDE 0.9% FLUSH
10.0000 mL | Freq: Once | INTRAVENOUS | Status: AC
  Administered 2023-07-07: 10 mL

## 2023-07-07 MED ORDER — SODIUM CHLORIDE 0.9 % IV SOLN
600.0000 mg/m2 | Freq: Once | INTRAVENOUS | Status: AC
  Administered 2023-07-07: 1120 mg via INTRAVENOUS
  Filled 2023-07-07: qty 56

## 2023-07-07 MED ORDER — DEXAMETHASONE SODIUM PHOSPHATE 10 MG/ML IJ SOLN
10.0000 mg | Freq: Once | INTRAMUSCULAR | Status: AC
Start: 2023-07-07 — End: 2023-07-07
  Administered 2023-07-07: 10 mg via INTRAVENOUS
  Filled 2023-07-07: qty 1

## 2023-07-07 MED ORDER — PALONOSETRON HCL INJECTION 0.25 MG/5ML
0.2500 mg | Freq: Once | INTRAVENOUS | Status: AC
  Administered 2023-07-07: 0.25 mg via INTRAVENOUS
  Filled 2023-07-07: qty 5

## 2023-07-07 MED ORDER — SODIUM CHLORIDE 0.9 % IV SOLN
150.0000 mg | Freq: Once | INTRAVENOUS | Status: AC
  Administered 2023-07-07: 150 mg via INTRAVENOUS
  Filled 2023-07-07: qty 150

## 2023-07-07 NOTE — Progress Notes (Signed)
 [facility] CONSULT NOTE  Patient Care Team: [name], NP as PCP - General (Nurse Practitioner) Donnelly Angelica, RN as Oncology Nurse Navigator Pershing Proud, RN as Oncology Nurse Navigator Almond Lint, MD as Consulting Physician (General Surgery) Rachel Moulds, MD as Consulting Physician (Hematology and Oncology) Dorothy Puffer, MD as Consulting Physician (Radiation Oncology)  CHIEF COMPLAINTS/PURPOSE OF CONSULTATION:  Newly diagnosed breast cancer  HISTORY OF PRESENTING ILLNESS:  HULDAH [name] 68 y.o. female is here because of recent diagnosis of right breast cancer  I reviewed her records extensively and collaborated the history with the patient.  SUMMARY OF ONCOLOGIC HISTORY: Oncology History  Malignant neoplasm of upper-outer quadrant of right breast in female, estrogen receptor positive (HCC)  02/22/2023 Mammogram   Screening mammogram on February 22, 2023 showed possible mass in the right breast with calcifications warranting further evaluation.  No findings suspicious for malignancy in the left breast.  Diagnostic mammogram done confirmed a persistent irregular mass within the central right breast, adjacent to the mass are slightly heterogeneous calcifications.  Ultrasound showed 1.1 x 1.2 x 1.3 cm irregular hypoechoic mass at 10 o'clock position of the right breast 1 cm from the nipple, no abnormal right axillary lymph nodes are noted.   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  Prognostic showed ER 100% positive strong staining PR 95% positive strong staining Ki-67 of 5% and HER2 2+   03/14/2023 Initial Diagnosis   Malignant neoplasm of upper-outer quadrant of right breast in female, estrogen receptor positive (HCC)    Genetic Testing   [facility] CancerNext-Expanded Panel+RNA was Negative. Report date is  03/27/2023.   The CancerNext-Expanded gene panel offered by [facility] and includes sequencing, rearrangement, and RNA analysis for the following 71 genes: AIP, ALK, APC, ATM, AXIN2, BAP1, BARD1, BMPR1A, BRCA1, BRCA2, BRIP1, CDC73, CDH1, CDK4, CDKN1B, CDKN2A, CHEK2, CTNNA1, DICER1, FH, FLCN, KIF1B, LZTR1, MAX, MEN1, MET, MLH1, MSH2, MSH3, MSH6, MUTYH, NF1, NF2, NTHL1, PALB2, PHOX2B, PMS2, POT1, PRKAR1A, PTCH1, PTEN, RAD51C, RAD51D, RB1, RET, SDHA, SDHAF2, SDHB, SDHC, SDHD, SMAD4, SMARCA4, SMARCB1, SMARCE1, STK11, SUFU, TMEM127, TP53, TSC1, TSC2, and VHL (sequencing and deletion/duplication); EGFR, EGLN1, HOXB13, KIT, MITF, PDGFRA, POLD1, and POLE (sequencing only); EPCAM and GREM1 (deletion/duplication only).    05/31/2023 Cancer Staging   Staging form: Breast, AJCC 8th Edition - Pathologic stage from 05/31/2023: Stage IB (pT1c, pN2a, cM0, G2, ER+, PR+, HER2-) - Signed by Rachel Moulds, MD on 05/31/2023 Stage prefix: Initial diagnosis Method of lymph node assessment: Axillary lymph node dissection Histologic grading system: 3 grade system   06/09/2023 -  Chemotherapy   Patient is on Treatment Plan : BREAST DOSE DENSE AC q14d / PACLitaxel q7d      Discussed the use of AI scribe software for clinical note transcription with the patient, who gave verbal consent to proceed.  History of Present Illness    The patient, with a history of breast cancer, presents for a follow-up after recent surgery.  Discussed the use of AI scribe software for clinical note transcription with the patient, who gave verbal consent to proceed.  She experiences constipation following chemotherapy, which she manages with a stool softener in the initial days post-treatment. Coconut water and kefir provide additional relief for both constipation and nausea.  On days four and five post-chemotherapy, she feels tired and more nauseated, for which she  takes anti-nausea medication. By day six, her symptoms typically improve.  No fevers or chills are noted during this period.  Occasional mouth sores are managed by rinsing with a baking soda and salt solution in warm water a couple of times a day, effectively controlling the symptoms.  She experiences some discomfort from a bone shot, which she mitigates with antihistamines for seven days, resulting in negligible discomfort.  She reports a bruised appearance on her breast, which she attributes to massaging the lymph nodes.  Rest of the pertinent 10 point ROS reviewed and negative  MEDICAL HISTORY:  Past Medical History:  Diagnosis Date   Anxiety and depression    Elevated LDL cholesterol level    Elevated liver enzymes 07/23/2021   GERD (gastroesophageal reflux disease)    Malignant neoplasm of breast (female) (HCC)    Thyroid disorder 11/14/2017    SURGICAL HISTORY: Past Surgical History:  Procedure Laterality Date   AXILLARY LYMPH NODE DISSECTION Right 05/18/2023   Procedure: RIGHT AXILLARY LYMPH NODE DISSECTION;  Surgeon: Almond Lint, MD;  Location: [facility] OR;  Service: General;  Laterality: Right;  PEC BLOCK   BREAST BIOPSY Right 03/09/2023   [place] RT BREAST BX W LOC DEV 1ST LESION IMG BX SPEC US GUIDE 03/09/2023 [facility] MAMMOGRAPHY   BREAST BIOPSY  04/12/2023   [place] RT RADIOACTIVE SEED LOC 04/12/2023 [facility] ULTRASOUND   BREAST LUMPECTOMY WITH RADIOACTIVE SEED AND SENTINEL LYMPH NODE BIOPSY Right 04/13/2023   Procedure: RIGHT BREAST SEED LOCALIZED LUMPECTOMY WITH SENTINEL NODE BIOPSY;  Surgeon: Almond Lint, MD;  Location: [facility];  Service: General;  Laterality: Right;   HERNIA REPAIR     LAPAROSCOPIC HYSTERECTOMY  03/2017   Partial, pt still has both adnexa   PORTACATH PLACEMENT N/A 05/18/2023   Procedure: PORT PLACEMENT WITH ULTRASOUND GUIDANCE;  Surgeon: Almond Lint, MD;  Location: [facility] OR;  Service: General;  Laterality: N/A;    SOCIAL HISTORY: Social History   Socioeconomic History   Marital status: Divorced    Spouse name: Not on  file   Number of children: Not on file   Years of education: Not on file   Highest education level: Master's degree (e.g., MA, MS, MEng, MEd, MSW, MBA)  Occupational History   Not on file  Tobacco Use   Smoking status: Former   Smokeless tobacco: Never   Tobacco comments:    stopped at age 70. smoked for 4-5 years 1 ppd  Vaping Use   Vaping status: Never Used  Substance and Sexual Activity   Alcohol use: Yes    Comment: WINE OCC   Drug use: Never   Sexual activity: Not Currently    Partners: Male    Birth control/protection: Surgical    Comment: hysterectomy  Other Topics Concern   Not on file  Social History Narrative   Not on file   Social Drivers of Health   Financial Resource Strain: Low Risk  (06/20/2023)   Overall Financial Resource Strain (CARDIA)    Difficulty of Paying Living Expenses: Not very hard  Food Insecurity: No Food Insecurity (06/20/2023)   Hunger Vital Sign    Worried About Running Out of Food in the Last Year: Never true    Ran Out of Food in the Last Year: Never true  Transportation Needs: No Transportation Needs (06/20/2023)   PRAPARE - Administrator, Civil Service (Medical): No    Lack of Transportation (Non-Medical): No  Physical Activity: Insufficiently Active (06/20/2023)   Exercise Vital  Sign    Days of Exercise per Week: 3 days    Minutes of Exercise per Session: 30 min  Stress: Stress Concern Present (06/20/2023)   [employer] of Occupational Health - Occupational Stress Questionnaire    Feeling of Stress : To some extent  Social Connections: Unknown (06/20/2023)   Social Connection and Isolation Panel [NHANES]    Frequency of Communication with Friends and Family: More than three times a week    Frequency of Social Gatherings with Friends and Family: Once a week    Attends Religious Services: Patient declined    Database administrator or Organizations: No    Attends Engineer, structural: Not on file    Marital  Status: Divorced  Intimate Partner Violence: Not At Risk (03/16/2023)   Humiliation, Afraid, Rape, and Kick questionnaire    Fear of Current or Ex-Partner: No    Emotionally Abused: No    Physically Abused: No    Sexually Abused: No    FAMILY HISTORY: Family History  Problem Relation Age of Onset   Breast cancer Mother 34   Diabetes Mother    Hypertension Mother    Cancer Father 24 - 63       liver?   Diabetes Brother    Cancer Paternal Uncle    Breast cancer Paternal Grandmother 73    ALLERGIES:  is allergic to wellbutrin [bupropion].  MEDICATIONS:  Current Outpatient Medications  Medication Sig Dispense Refill   Biotin 5000 MCG CAPS Take 1 capsule by mouth daily.     cycloSPORINE (RESTASIS) 0.05 % ophthalmic emulsion Place 1 drop into both eyes 2 (two) times daily.     dexamethasone (DECADRON) 4 MG tablet Take 2 tablets (8 mg total) by mouth daily for 3 days. Start the day after doxorubicin/cyclophosphamide chemotherapy. Take with food. (Patient not taking: Reported on 05/26/2023) 30 tablet 1   EVENING PRIMROSE OIL PO Take 1 tablet by mouth daily.     Flaxseed, Linseed, (FLAX SEED OIL PO) Take 1 capsule by mouth daily.     glucosamine-chondroitin 500-400 MG tablet Take 1 tablet by mouth daily.     lidocaine-prilocaine (EMLA) cream Apply to affected area once (Patient not taking: Reported on 05/26/2023) 30 g 3   MAGNESIUM GLYCINATE PO Take 350 mg by mouth daily.     Methylsulfonylmethane (MSM) 1000 MG TABS Take 1 tablet by mouth 3 (three) times a week.     milk thistle 175 MG tablet Take 175 mg by mouth daily.     ondansetron (ZOFRAN) 8 MG tablet Take 1 tab (8 mg) by mouth every 8 hrs as needed for nausea/vomiting. Start third day after doxorubicin/cyclophosphamide chemotherapy. (Patient not taking: Reported on 05/26/2023) 30 tablet 1   OVER THE COUNTER MEDICATION Holy basil -   5 times a week     PARoxetine (PAXIL) 10 MG tablet Take 1 tablet (10 mg total) by mouth daily. 90  tablet 1   prochlorperazine (COMPAZINE) 10 MG tablet Take 1 tablet (10 mg total) by mouth every 6 (six) hours as needed for nausea or vomiting. (Patient not taking: Reported on 05/26/2023) 30 tablet 1   pyridoxine (B-6) 100 MG tablet Take 100 mg by mouth daily. (Patient not taking: Reported on 05/26/2023)     vitamin B-12 (CYANOCOBALAMIN) 500 MCG tablet Take 500 mcg by mouth daily. (Patient not taking: Reported on 05/26/2023)     vitamin C (ASCORBIC ACID) 250 MG tablet Take 250 mg by mouth daily. (Patient not  taking: Reported on 05/26/2023)     VITAMIN D PO Take 1 tablet by mouth daily.     No current facility-administered medications for this visit.    REVIEW OF SYSTEMS:   Constitutional: Denies fevers, chills or abnormal night sweats Eyes: Denies blurriness of vision, double vision or watery eyes Ears, nose, mouth, throat, and face: Denies mucositis or sore throat Respiratory: Denies cough, dyspnea or wheezes Cardiovascular: Denies palpitation, chest discomfort or lower extremity swelling Gastrointestinal:  Denies nausea, heartburn or change in bowel habits Skin: Denies abnormal skin rashes Lymphatics: Denies new lymphadenopathy or easy bruising Neurological:Denies numbness, tingling or new weaknesses Behavioral/Psych: Mood is stable, no new changes  Breast: Denies any palpable lumps or discharge All other systems were reviewed with the patient and are negative.  PHYSICAL EXAMINATION: ECOG PERFORMANCE STATUS: 0 - Asymptomatic  Vitals:   07/07/23 1306  BP: 120/65  Pulse: 88  Resp: 16  Temp: 97.6 F (36.4 C)  SpO2: 95%    Filed Weights   07/07/23 1306  Weight: 170 lb 6.4 oz (77.3 kg)   GENERAL:alert, no distress and comfortable Right breast healing well, Chest: CTA bilaterally Heart: RRR No LE swelling  LABORATORY DATA:  I have reviewed the data as listed Lab Results  Component Value Date   WBC 13.3 (H) 07/07/2023   HGB 11.1 (L) 07/07/2023   HCT 33.0 (L) 07/07/2023    MCV 95.9 07/07/2023   PLT 252 07/07/2023   Lab Results  Component Value Date   NA 140 07/07/2023   K 4.0 07/07/2023   CL 108 07/07/2023   CO2 29 07/07/2023    RADIOGRAPHIC STUDIES: I have personally reviewed the radiological reports and agreed with the findings in the report.  ASSESSMENT AND PLAN:  Malignant neoplasm of upper-outer quadrant of right breast in female, estrogen receptor positive (HCC) Chemotherapy-induced Constipation Managed with stool softeners, coconut water, and kefir. -Continue current regimen as it appears to be effective.  Chemotherapy-induced Nausea Managed with coconut water and nausea pills on days 4-6 post-chemotherapy. -Continue current regimen as it appears to be effective.  Chemotherapy-induced Fatigue Noted on days 4-5 post-chemotherapy. -Continue to monitor and manage as needed.  Chemotherapy-induced Oral Mucositis Managed with baking soda and salt in warm water. -Continue current regimen as it appears to be effective.  Chemotherapy-induced Myelosuppression Mildly elevated white blood cell count and mildly decreased hemoglobin noted on recent labs. Platelets normal. -Continue to monitor blood counts closely during chemotherapy.  General Health Maintenance / Followup Plans -Continue with chemotherapy as planned. -Plan to see patient every other week during weekly chemotherapy regimen.   All questions were answered. The patient knows to call the clinic with any problems, questions or concerns.    Rachel Moulds, MD 07/07/23

## 2023-07-07 NOTE — Assessment & Plan Note (Signed)
 Chemotherapy-induced Constipation Managed with stool softeners, coconut water, and kefir. -Continue current regimen as it appears to be effective.  Chemotherapy-induced Nausea Managed with coconut water and nausea pills on days 4-6 post-chemotherapy. -Continue current regimen as it appears to be effective.  Chemotherapy-induced Fatigue Noted on days 4-5 post-chemotherapy. -Continue to monitor and manage as needed.  Chemotherapy-induced Oral Mucositis Managed with baking soda and salt in warm water. -Continue current regimen as it appears to be effective.  Chemotherapy-induced Myelosuppression Mildly elevated white blood cell count and mildly decreased hemoglobin noted on recent labs. Platelets normal. -Continue to monitor blood counts closely during chemotherapy.  General Health Maintenance / Followup Plans -Continue with chemotherapy as planned. -Plan to see patient every other week during weekly chemotherapy regimen.

## 2023-07-07 NOTE — Patient Instructions (Signed)
 [facility] - A DEPT OF [facility]  Discharge Instructions: Thank you for choosing [facility] to provide your oncology and hematology care.   If you have a lab appointment with the Cancer Center, please go directly to the Cancer Center and check in at the registration area.   Wear comfortable clothing and clothing appropriate for easy access to any Portacath or PICC line.   We strive to give you quality time with your provider. You may need to reschedule your appointment if you arrive late (15 or more minutes).  Arriving late affects you and other patients whose appointments are after yours.  Also, if you miss three or more appointments without notifying the office, you may be dismissed from the clinic at the provider's discretion.      For prescription refill requests, have your pharmacy contact our office and allow 72 hours for refills to be completed.    Today you received the following chemotherapy and/or immunotherapy agents: Adriamycin/Cytoxan      To help prevent nausea and vomiting after your treatment, we encourage you to take your nausea medication as directed.  BELOW ARE SYMPTOMS THAT SHOULD BE REPORTED IMMEDIATELY: *FEVER GREATER THAN 100.4 F (38 C) OR HIGHER *CHILLS OR SWEATING *NAUSEA AND VOMITING THAT IS NOT CONTROLLED WITH YOUR NAUSEA MEDICATION *UNUSUAL SHORTNESS OF BREATH *UNUSUAL BRUISING OR BLEEDING *URINARY PROBLEMS (pain or burning when urinating, or frequent urination) *BOWEL PROBLEMS (unusual diarrhea, constipation, pain near the anus) TENDERNESS IN MOUTH AND THROAT WITH OR WITHOUT PRESENCE OF ULCERS (sore throat, sores in mouth, or a toothache) UNUSUAL RASH, SWELLING OR PAIN  UNUSUAL VAGINAL DISCHARGE OR ITCHING   Items with * indicate a potential emergency and should be followed up as soon as possible or go to the Emergency Department if any problems should occur.  Please show the CHEMOTHERAPY ALERT CARD or  IMMUNOTHERAPY ALERT CARD at check-in to the Emergency Department and triage nurse.  Should you have questions after your visit or need to cancel or reschedule your appointment, please contact [facility] - A DEPT OF [facility]  Dept: [phone]  and follow the prompts.  Office hours are 8:00 a.m. to 4:30 p.m. Monday - Friday. Please note that voicemails left after 4:00 p.m. may not be returned until the following business day.  We are closed weekends and major holidays. You have access to a nurse at all times for urgent questions. Please call the main number to the clinic Dept: [phone] and follow the prompts.   For any non-urgent questions, you may also contact your provider using MyChart. We now offer e-Visits for anyone 67 and older to request care online for non-urgent symptoms. For details visit [email or web address].   Also download the MyChart app! Go to the app store, search "MyChart", open the app, select [facility], and log in with your MyChart username and password.

## 2023-07-09 ENCOUNTER — Inpatient Hospital Stay: Payer: Medicare Other | Attending: Hematology and Oncology

## 2023-07-09 VITALS — BP 96/83 | HR 73 | Temp 97.0°F | Resp 18

## 2023-07-09 DIAGNOSIS — Z79899 Other long term (current) drug therapy: Secondary | ICD-10-CM | POA: Diagnosis not present

## 2023-07-09 DIAGNOSIS — Z7952 Long term (current) use of systemic steroids: Secondary | ICD-10-CM | POA: Diagnosis not present

## 2023-07-09 DIAGNOSIS — Z79632 Long term (current) use of antitumor antibiotic: Secondary | ICD-10-CM | POA: Diagnosis not present

## 2023-07-09 DIAGNOSIS — Z1721 Progesterone receptor positive status: Secondary | ICD-10-CM | POA: Insufficient documentation

## 2023-07-09 DIAGNOSIS — Z170 Estrogen receptor positive status [ER+]: Secondary | ICD-10-CM | POA: Insufficient documentation

## 2023-07-09 DIAGNOSIS — Z79630 Long term (current) use of alkylating agent: Secondary | ICD-10-CM | POA: Insufficient documentation

## 2023-07-09 DIAGNOSIS — Z5111 Encounter for antineoplastic chemotherapy: Secondary | ICD-10-CM | POA: Insufficient documentation

## 2023-07-09 DIAGNOSIS — Z1732 Human epidermal growth factor receptor 2 negative status: Secondary | ICD-10-CM | POA: Diagnosis not present

## 2023-07-09 DIAGNOSIS — Z5189 Encounter for other specified aftercare: Secondary | ICD-10-CM | POA: Diagnosis not present

## 2023-07-09 DIAGNOSIS — C50411 Malignant neoplasm of upper-outer quadrant of right female breast: Secondary | ICD-10-CM | POA: Diagnosis present

## 2023-07-09 DIAGNOSIS — Z87891 Personal history of nicotine dependence: Secondary | ICD-10-CM | POA: Insufficient documentation

## 2023-07-09 MED ORDER — PEGFILGRASTIM-FPGK 6 MG/0.6ML ~~LOC~~ SOSY
6.0000 mg | PREFILLED_SYRINGE | Freq: Once | SUBCUTANEOUS | Status: AC
  Administered 2023-07-09: 6 mg via SUBCUTANEOUS

## 2023-07-20 ENCOUNTER — Encounter: Payer: Self-pay | Admitting: *Deleted

## 2023-07-20 MED FILL — Fosaprepitant Dimeglumine For IV Infusion 150 MG (Base Eq): INTRAVENOUS | Qty: 5 | Status: AC

## 2023-07-21 ENCOUNTER — Inpatient Hospital Stay: Payer: Medicare Other

## 2023-07-21 ENCOUNTER — Inpatient Hospital Stay: Payer: Medicare Other | Admitting: Hematology and Oncology

## 2023-07-21 VITALS — BP 122/65 | HR 95 | Temp 97.3°F | Resp 17 | Wt 167.2 lb

## 2023-07-21 DIAGNOSIS — Z170 Estrogen receptor positive status [ER+]: Secondary | ICD-10-CM

## 2023-07-21 DIAGNOSIS — C50411 Malignant neoplasm of upper-outer quadrant of right female breast: Secondary | ICD-10-CM | POA: Diagnosis not present

## 2023-07-21 DIAGNOSIS — R112 Nausea with vomiting, unspecified: Secondary | ICD-10-CM | POA: Diagnosis not present

## 2023-07-21 DIAGNOSIS — Z5111 Encounter for antineoplastic chemotherapy: Secondary | ICD-10-CM | POA: Diagnosis not present

## 2023-07-21 DIAGNOSIS — T451X5A Adverse effect of antineoplastic and immunosuppressive drugs, initial encounter: Secondary | ICD-10-CM | POA: Diagnosis not present

## 2023-07-21 DIAGNOSIS — Z95828 Presence of other vascular implants and grafts: Secondary | ICD-10-CM

## 2023-07-21 LAB — CBC WITH DIFFERENTIAL (CANCER CENTER ONLY)
Abs Immature Granulocytes: 1.31 10*3/uL — ABNORMAL HIGH (ref 0.00–0.07)
Basophils Absolute: 0.1 10*3/uL (ref 0.0–0.1)
Basophils Relative: 1 %
Eosinophils Absolute: 0 10*3/uL (ref 0.0–0.5)
Eosinophils Relative: 0 %
HCT: 32.1 % — ABNORMAL LOW (ref 36.0–46.0)
Hemoglobin: 10.8 g/dL — ABNORMAL LOW (ref 12.0–15.0)
Immature Granulocytes: 9 %
Lymphocytes Relative: 14 %
Lymphs Abs: 2 10*3/uL (ref 0.7–4.0)
MCH: 32.5 pg (ref 26.0–34.0)
MCHC: 33.6 g/dL (ref 30.0–36.0)
MCV: 96.7 fL (ref 80.0–100.0)
Monocytes Absolute: 1.4 10*3/uL — ABNORMAL HIGH (ref 0.1–1.0)
Monocytes Relative: 9 %
Neutro Abs: 9.6 10*3/uL — ABNORMAL HIGH (ref 1.7–7.7)
Neutrophils Relative %: 67 %
Platelet Count: 226 10*3/uL (ref 150–400)
RBC: 3.32 MIL/uL — ABNORMAL LOW (ref 3.87–5.11)
RDW: 14.7 % (ref 11.5–15.5)
WBC Count: 14.4 10*3/uL — ABNORMAL HIGH (ref 4.0–10.5)
nRBC: 0.6 % — ABNORMAL HIGH (ref 0.0–0.2)

## 2023-07-21 LAB — CMP (CANCER CENTER ONLY)
ALT: 17 U/L (ref 0–44)
AST: 16 U/L (ref 15–41)
Albumin: 4.1 g/dL (ref 3.5–5.0)
Alkaline Phosphatase: 94 U/L (ref 38–126)
Anion gap: 4 — ABNORMAL LOW (ref 5–15)
BUN: 10 mg/dL (ref 8–23)
CO2: 29 mmol/L (ref 22–32)
Calcium: 8.6 mg/dL — ABNORMAL LOW (ref 8.9–10.3)
Chloride: 107 mmol/L (ref 98–111)
Creatinine: 0.78 mg/dL (ref 0.44–1.00)
GFR, Estimated: >60 mL/min (ref >60)
Glucose, Bld: 99 mg/dL (ref 70–99)
Potassium: 4 mmol/L (ref 3.5–5.1)
Sodium: 140 mmol/L (ref 135–145)
Total Bilirubin: 0.3 mg/dL (ref 0.0–1.2)
Total Protein: 6.4 g/dL — ABNORMAL LOW (ref 6.5–8.1)

## 2023-07-21 MED ORDER — DEXAMETHASONE SODIUM PHOSPHATE 10 MG/ML IJ SOLN
10.0000 mg | Freq: Once | INTRAMUSCULAR | Status: AC
  Administered 2023-07-21: 10 mg via INTRAVENOUS
  Filled 2023-07-21: qty 1

## 2023-07-21 MED ORDER — SODIUM CHLORIDE 0.9 % IV SOLN
600.0000 mg/m2 | Freq: Once | INTRAVENOUS | Status: AC
  Administered 2023-07-21: 1120 mg via INTRAVENOUS
  Filled 2023-07-21: qty 56

## 2023-07-21 MED ORDER — SODIUM CHLORIDE 0.9 % IV SOLN: INTRAVENOUS | Status: DC

## 2023-07-21 MED ORDER — FOSAPREPITANT DIMEGLUMINE INJECTION 150 MG
150.0000 mg | Freq: Once | INTRAVENOUS | Status: AC
  Administered 2023-07-21: 150 mg via INTRAVENOUS
  Filled 2023-07-21: qty 150

## 2023-07-21 MED ORDER — DOXORUBICIN HCL CHEMO IV INJECTION 2 MG/ML
60.0000 mg/m2 | Freq: Once | INTRAVENOUS | Status: AC
  Administered 2023-07-21: 112 mg via INTRAVENOUS
  Filled 2023-07-21: qty 56

## 2023-07-21 MED ORDER — SODIUM CHLORIDE 0.9% FLUSH
10.0000 mL | Freq: Once | INTRAVENOUS | Status: AC
  Administered 2023-07-21: 10 mL

## 2023-07-21 MED ORDER — HEPARIN SOD (PORK) LOCK FLUSH 100 UNIT/ML IV SOLN
500.0000 [IU] | Freq: Once | INTRAVENOUS | Status: AC | PRN
  Administered 2023-07-21: 500 [IU]

## 2023-07-21 MED ORDER — SODIUM CHLORIDE 0.9% FLUSH
10.0000 mL | INTRAVENOUS | Status: DC | PRN
  Administered 2023-07-21: 10 mL

## 2023-07-21 MED ORDER — PALONOSETRON HCL INJECTION 0.25 MG/5ML
0.2500 mg | Freq: Once | INTRAVENOUS | Status: AC
Start: 2023-07-21 — End: 2023-07-21
  Administered 2023-07-21: 0.25 mg via INTRAVENOUS
  Filled 2023-07-21: qty 5

## 2023-07-21 NOTE — Patient Instructions (Signed)
 [facility] - A DEPT OF [facility]  Discharge Instructions: Thank you for choosing [facility] to provide your oncology and hematology care.   If you have a lab appointment with the Cancer Center, please go directly to the Cancer Center and check in at the registration area.   Wear comfortable clothing and clothing appropriate for easy access to any Portacath or PICC line.   We strive to give you quality time with your provider. You may need to reschedule your appointment if you arrive late (15 or more minutes).  Arriving late affects you and other patients whose appointments are after yours.  Also, if you miss three or more appointments without notifying the office, you may be dismissed from the clinic at the provider's discretion.      For prescription refill requests, have your pharmacy contact our office and allow 72 hours for refills to be completed.    Today you received the following chemotherapy and/or immunotherapy agents: Adriamycin/Cytoxan      To help prevent nausea and vomiting after your treatment, we encourage you to take your nausea medication as directed.  BELOW ARE SYMPTOMS THAT SHOULD BE REPORTED IMMEDIATELY: *FEVER GREATER THAN 100.4 F (38 C) OR HIGHER *CHILLS OR SWEATING *NAUSEA AND VOMITING THAT IS NOT CONTROLLED WITH YOUR NAUSEA MEDICATION *UNUSUAL SHORTNESS OF BREATH *UNUSUAL BRUISING OR BLEEDING *URINARY PROBLEMS (pain or burning when urinating, or frequent urination) *BOWEL PROBLEMS (unusual diarrhea, constipation, pain near the anus) TENDERNESS IN MOUTH AND THROAT WITH OR WITHOUT PRESENCE OF ULCERS (sore throat, sores in mouth, or a toothache) UNUSUAL RASH, SWELLING OR PAIN  UNUSUAL VAGINAL DISCHARGE OR ITCHING   Items with * indicate a potential emergency and should be followed up as soon as possible or go to the Emergency Department if any problems should occur.  Please show the CHEMOTHERAPY ALERT CARD or  IMMUNOTHERAPY ALERT CARD at check-in to the Emergency Department and triage nurse.  Should you have questions after your visit or need to cancel or reschedule your appointment, please contact [facility] - A DEPT OF [facility]  Dept: [phone]  and follow the prompts.  Office hours are 8:00 a.m. to 4:30 p.m. Monday - Friday. Please note that voicemails left after 4:00 p.m. may not be returned until the following business day.  We are closed weekends and major holidays. You have access to a nurse at all times for urgent questions. Please call the main number to the clinic Dept: [phone] and follow the prompts.   For any non-urgent questions, you may also contact your provider using MyChart. We now offer e-Visits for anyone 67 and older to request care online for non-urgent symptoms. For details visit [email or web address].   Also download the MyChart app! Go to the app store, search "MyChart", open the app, select [facility], and log in with your MyChart username and password.

## 2023-07-21 NOTE — Assessment & Plan Note (Signed)
 Breast Cancer Undergoing fourth round of AC chemotherapy. Increased fatigue and vomiting due to cumulative toxicity. Transition to weekly Taxol planned, expected to reduce nausea and eliminate need for growth factor injections. Taxol risks include neuropathy and allergic reactions, to be monitored closely. - Complete fourth round of AC chemotherapy. - Transition to weekly Taxol. - Monitor for neuropathy and allergic reactions with Taxol. - Educate on symptoms of neuropathy and allergic reactions to report. - Switch to napaclitaxel if allergic reaction to Taxol occurs.  Chemotherapy-Induced Nausea and Vomiting Increased nausea and vomiting during current chemotherapy cycle, requiring more frequent antiemetic use. - Continue Zofran for nausea management. - Adjust antiemetic regimen as needed.  Chemotherapy-Induced Fatigue Significant fatigue due to cumulative toxicity, expected to improve with weekly Taxol. - Encourage rest and hydration. - Provide supportive care as needed.  Chemotherapy-Induced Myelosuppression Blood counts satisfactory - Encourage continued use of protein drinks. - Ensure adequate hydration.  Follow-up Transitioning to new chemotherapy regimen, requiring close monitoring. - Schedule follow-up appointments to monitor response to Taxol and manage side effects. - Encourage reporting of any adverse effects or concerns promptly.

## 2023-07-21 NOTE — Progress Notes (Signed)
 [facility] CONSULT NOTE  Patient Care Team: [name], NP as PCP - General (Nurse Practitioner) Donnelly Angelica, RN as Oncology Nurse Navigator Pershing Proud, RN as Oncology Nurse Navigator Almond Lint, MD as Consulting Physician (General Surgery) Rachel Moulds, MD as Consulting Physician (Hematology and Oncology) Dorothy Puffer, MD as Consulting Physician (Radiation Oncology)  CHIEF COMPLAINTS/PURPOSE OF CONSULTATION:  Newly diagnosed breast cancer  HISTORY OF PRESENTING ILLNESS:  Brittany [name] 68 y.o. female is here because of recent diagnosis of right breast cancer  I reviewed her records extensively and collaborated the history with the patient.  SUMMARY OF ONCOLOGIC HISTORY: Oncology History  Malignant neoplasm of upper-outer quadrant of right breast in female, estrogen receptor positive (HCC)  02/22/2023 Mammogram   Screening mammogram on February 22, 2023 showed possible mass in the right breast with calcifications warranting further evaluation.  No findings suspicious for malignancy in the left breast.  Diagnostic mammogram done confirmed a persistent irregular mass within the central right breast, adjacent to the mass are slightly heterogeneous calcifications.  Ultrasound showed 1.1 x 1.2 x 1.3 cm irregular hypoechoic mass at 10 o'clock position of the right breast 1 cm from the nipple, no abnormal right axillary lymph nodes are noted.   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  Prognostic showed ER 100% positive strong staining PR 95% positive strong staining Ki-67 of 5% and HER2 2+   03/14/2023 Initial Diagnosis   Malignant neoplasm of upper-outer quadrant of right breast in female, estrogen receptor positive (HCC)    Genetic Testing   [facility] CancerNext-Expanded Panel+RNA was Negative. Report date is  03/27/2023.   The CancerNext-Expanded gene panel offered by [facility] and includes sequencing, rearrangement, and RNA analysis for the following 71 genes: AIP, ALK, APC, ATM, AXIN2, BAP1, BARD1, BMPR1A, BRCA1, BRCA2, BRIP1, CDC73, CDH1, CDK4, CDKN1B, CDKN2A, CHEK2, CTNNA1, DICER1, FH, FLCN, KIF1B, LZTR1, MAX, MEN1, MET, MLH1, MSH2, MSH3, MSH6, MUTYH, NF1, NF2, NTHL1, PALB2, PHOX2B, PMS2, POT1, PRKAR1A, PTCH1, PTEN, RAD51C, RAD51D, RB1, RET, SDHA, SDHAF2, SDHB, SDHC, SDHD, SMAD4, SMARCA4, SMARCB1, SMARCE1, STK11, SUFU, TMEM127, TP53, TSC1, TSC2, and VHL (sequencing and deletion/duplication); EGFR, EGLN1, HOXB13, KIT, MITF, PDGFRA, POLD1, and POLE (sequencing only); EPCAM and GREM1 (deletion/duplication only).    05/31/2023 Cancer Staging   Staging form: Breast, AJCC 8th Edition - Pathologic stage from 05/31/2023: Stage IB (pT1c, pN2a, cM0, G2, ER+, PR+, HER2-) - Signed by Rachel Moulds, MD on 05/31/2023 Stage prefix: Initial diagnosis Method of lymph node assessment: Axillary lymph node dissection Histologic grading system: 3 grade system   06/09/2023 -  Chemotherapy   Patient is on Treatment Plan : BREAST DOSE DENSE AC q14d / PACLitaxel q7d      Discussed the use of AI scribe software for clinical note transcription with the patient, who gave verbal consent to proceed.  History of Present Illness    She is here before C4 of AC.  The patient, with breast cancer, presents for her fourth round of chemotherapy.  She experienced a negative experience with the previous chemotherapy infusion, noting that the nurse was inattentive, resulting in a portion of the chemotherapy drug remaining in the syringe. She believes this contributed to increased fatigue and anxiety, which persisted throughout the week. No chest pain, shortness of breath, or palpitations.  Following the last chemotherapy session, she experienced vomiting for one  day, a new symptom during this treatment cycle. She has been taking  Zofran during the day and another medication at night, typically twice a day, which is more frequent than before. No constipation, as she has regular bowel movements without the need for stool softeners.  She received a growth factor injection, which she found unpleasant due to confusion and lack of confidence in the nurse administering the injection, leading to concerns about receiving the correct medication.   Rest of the pertinent 10 point ROS reviewed and negative  MEDICAL HISTORY:  Past Medical History:  Diagnosis Date   Anxiety and depression    Elevated LDL cholesterol level    Elevated liver enzymes 07/23/2021   GERD (gastroesophageal reflux disease)    Malignant neoplasm of breast (female) (HCC)    Thyroid disorder 11/14/2017    SURGICAL HISTORY: Past Surgical History:  Procedure Laterality Date   AXILLARY LYMPH NODE DISSECTION Right 05/18/2023   Procedure: RIGHT AXILLARY LYMPH NODE DISSECTION;  Surgeon: Almond Lint, MD;  Location: [facility] OR;  Service: General;  Laterality: Right;  PEC BLOCK   BREAST BIOPSY Right 03/09/2023   [place] RT BREAST BX W LOC DEV 1ST LESION IMG BX SPEC US GUIDE 03/09/2023 [facility] MAMMOGRAPHY   BREAST BIOPSY  04/12/2023   [place] RT RADIOACTIVE SEED LOC 04/12/2023 [facility] ULTRASOUND   BREAST LUMPECTOMY WITH RADIOACTIVE SEED AND SENTINEL LYMPH NODE BIOPSY Right 04/13/2023   Procedure: RIGHT BREAST SEED LOCALIZED LUMPECTOMY WITH SENTINEL NODE BIOPSY;  Surgeon: Almond Lint, MD;  Location: [facility];  Service: General;  Laterality: Right;   HERNIA REPAIR     LAPAROSCOPIC HYSTERECTOMY  03/2017   Partial, pt still has both adnexa   PORTACATH PLACEMENT N/A 05/18/2023   Procedure: PORT PLACEMENT WITH ULTRASOUND GUIDANCE;  Surgeon: Almond Lint, MD;  Location: [facility] OR;  Service: General;  Laterality: N/A;    SOCIAL HISTORY: Social History   Socioeconomic History   Marital status: Divorced    Spouse name: Not on file   Number of children: Not on file    Years of education: Not on file   Highest education level: Master's degree (e.g., MA, MS, MEng, MEd, MSW, MBA)  Occupational History   Not on file  Tobacco Use   Smoking status: Former   Smokeless tobacco: Never   Tobacco comments:    stopped at age 49. smoked for 4-5 years 1 ppd  Vaping Use   Vaping status: Never Used  Substance and Sexual Activity   Alcohol use: Yes    Comment: WINE OCC   Drug use: Never   Sexual activity: Not Currently    Partners: Male    Birth control/protection: Surgical    Comment: hysterectomy  Other Topics Concern   Not on file  Social History Narrative   Not on file   Social Drivers of Health   Financial Resource Strain: Low Risk  (06/20/2023)   Overall Financial Resource Strain (CARDIA)    Difficulty of Paying Living Expenses: Not very hard  Food Insecurity: No Food Insecurity (06/20/2023)   Hunger Vital Sign    Worried About Running Out of Food in the Last Year: Never true    Ran Out of Food in the Last Year: Never true  Transportation Needs: No Transportation Needs (06/20/2023)   PRAPARE - Administrator, Civil Service (Medical): No    Lack of Transportation (Non-Medical): No  Physical Activity: Insufficiently Active (06/20/2023)   Exercise Vital Sign    Days of Exercise  per Week: 3 days    Minutes of Exercise per Session: 30 min  Stress: Stress Concern Present (06/20/2023)   [employer] of Occupational Health - Occupational Stress Questionnaire    Feeling of Stress : To some extent  Social Connections: Unknown (06/20/2023)   Social Connection and Isolation Panel [NHANES]    Frequency of Communication with Friends and Family: More than three times a week    Frequency of Social Gatherings with Friends and Family: Once a week    Attends Religious Services: Patient declined    Database administrator or Organizations: No    Attends Engineer, structural: Not on file    Marital Status: Divorced  Intimate Partner Violence:  Not At Risk (03/16/2023)   Humiliation, Afraid, Rape, and Kick questionnaire    Fear of Current or Ex-Partner: No    Emotionally Abused: No    Physically Abused: No    Sexually Abused: No    FAMILY HISTORY: Family History  Problem Relation Age of Onset   Breast cancer Mother 10   Diabetes Mother    Hypertension Mother    Cancer Father 63 - 29       liver?   Diabetes Brother    Cancer Paternal Uncle    Breast cancer Paternal Grandmother 5    ALLERGIES:  is allergic to wellbutrin [bupropion].  MEDICATIONS:  Current Outpatient Medications  Medication Sig Dispense Refill   Biotin 5000 MCG CAPS Take 1 capsule by mouth daily.     cycloSPORINE (RESTASIS) 0.05 % ophthalmic emulsion Place 1 drop into both eyes 2 (two) times daily.     dexamethasone (DECADRON) 4 MG tablet Take 2 tablets (8 mg total) by mouth daily for 3 days. Start the day after doxorubicin/cyclophosphamide chemotherapy. Take with food. (Patient not taking: Reported on 05/26/2023) 30 tablet 1   EVENING PRIMROSE OIL PO Take 1 tablet by mouth daily.     Flaxseed, Linseed, (FLAX SEED OIL PO) Take 1 capsule by mouth daily.     glucosamine-chondroitin 500-400 MG tablet Take 1 tablet by mouth daily.     lidocaine-prilocaine (EMLA) cream Apply to affected area once (Patient not taking: Reported on 05/26/2023) 30 g 3   MAGNESIUM GLYCINATE PO Take 350 mg by mouth daily.     Methylsulfonylmethane (MSM) 1000 MG TABS Take 1 tablet by mouth 3 (three) times a week.     milk thistle 175 MG tablet Take 175 mg by mouth daily.     ondansetron (ZOFRAN) 8 MG tablet Take 1 tab (8 mg) by mouth every 8 hrs as needed for nausea/vomiting. Start third day after doxorubicin/cyclophosphamide chemotherapy. (Patient not taking: Reported on 05/26/2023) 30 tablet 1   OVER THE COUNTER MEDICATION Holy basil -   5 times a week     PARoxetine (PAXIL) 10 MG tablet Take 1 tablet (10 mg total) by mouth daily. 90 tablet 1   prochlorperazine (COMPAZINE) 10 MG  tablet Take 1 tablet (10 mg total) by mouth every 6 (six) hours as needed for nausea or vomiting. (Patient not taking: Reported on 05/26/2023) 30 tablet 1   pyridoxine (B-6) 100 MG tablet Take 100 mg by mouth daily. (Patient not taking: Reported on 05/26/2023)     vitamin B-12 (CYANOCOBALAMIN) 500 MCG tablet Take 500 mcg by mouth daily. (Patient not taking: Reported on 05/26/2023)     vitamin C (ASCORBIC ACID) 250 MG tablet Take 250 mg by mouth daily. (Patient not taking: Reported on 05/26/2023)  VITAMIN D PO Take 1 tablet by mouth daily.     No current facility-administered medications for this visit.    REVIEW OF SYSTEMS:   Constitutional: Denies fevers, chills or abnormal night sweats Eyes: Denies blurriness of vision, double vision or watery eyes Ears, nose, mouth, throat, and face: Denies mucositis or sore throat Respiratory: Denies cough, dyspnea or wheezes Cardiovascular: Denies palpitation, chest discomfort or lower extremity swelling Gastrointestinal:  Denies nausea, heartburn or change in bowel habits Skin: Denies abnormal skin rashes Lymphatics: Denies new lymphadenopathy or easy bruising Neurological:Denies numbness, tingling or new weaknesses Behavioral/Psych: Mood is stable, no new changes  Breast: Denies any palpable lumps or discharge All other systems were reviewed with the patient and are negative.  PHYSICAL EXAMINATION: ECOG PERFORMANCE STATUS: 0 - Asymptomatic  Vitals:   07/21/23 1259  BP: 122/65  Pulse: 95  Resp: 17  Temp: (!) 97.3 F (36.3 C)  SpO2: 95%    Filed Weights   07/21/23 1259  Weight: 167 lb 3.2 oz (75.8 kg)   GENERAL:alert, no distress and comfortable Chest: CTA bilaterally Heart: RRR No LE swelling  LABORATORY DATA:  I have reviewed the data as listed Lab Results  Component Value Date   WBC 14.4 (H) 07/21/2023   HGB 10.8 (L) 07/21/2023   HCT 32.1 (L) 07/21/2023   MCV 96.7 07/21/2023   PLT 226 07/21/2023   Lab Results  Component  Value Date   NA 140 07/07/2023   K 4.0 07/07/2023   CL 108 07/07/2023   CO2 29 07/07/2023    RADIOGRAPHIC STUDIES: I have personally reviewed the radiological reports and agreed with the findings in the report.  ASSESSMENT AND PLAN:  Malignant neoplasm of upper-outer quadrant of right breast in female, estrogen receptor positive (HCC) Breast Cancer Undergoing fourth round of AC chemotherapy. Increased fatigue and vomiting due to cumulative toxicity. Transition to weekly Taxol planned, expected to reduce nausea and eliminate need for growth factor injections. Taxol risks include neuropathy and allergic reactions, to be monitored closely. - Complete fourth round of AC chemotherapy. - Transition to weekly Taxol. - Monitor for neuropathy and allergic reactions with Taxol. - Educate on symptoms of neuropathy and allergic reactions to report. - Switch to napaclitaxel if allergic reaction to Taxol occurs.  Chemotherapy-Induced Nausea and Vomiting Increased nausea and vomiting during current chemotherapy cycle, requiring more frequent antiemetic use. - Continue Zofran for nausea management. - Adjust antiemetic regimen as needed.  Chemotherapy-Induced Fatigue Significant fatigue due to cumulative toxicity, expected to improve with weekly Taxol. - Encourage rest and hydration. - Provide supportive care as needed.  Chemotherapy-Induced Myelosuppression Blood counts satisfactory - Encourage continued use of protein drinks. - Ensure adequate hydration.  Follow-up Transitioning to new chemotherapy regimen, requiring close monitoring. - Schedule follow-up appointments to monitor response to Taxol and manage side effects. - Encourage reporting of any adverse effects or concerns promptly.    All questions were answered. The patient knows to call the clinic with any problems, questions or concerns.    Rachel Moulds, MD 07/21/23

## 2023-07-23 ENCOUNTER — Inpatient Hospital Stay: Payer: Medicare Other

## 2023-07-23 VITALS — BP 125/71 | HR 93 | Temp 97.3°F | Resp 16

## 2023-07-23 DIAGNOSIS — Z170 Estrogen receptor positive status [ER+]: Secondary | ICD-10-CM

## 2023-07-23 DIAGNOSIS — Z5111 Encounter for antineoplastic chemotherapy: Secondary | ICD-10-CM | POA: Diagnosis not present

## 2023-07-23 MED ORDER — PEGFILGRASTIM-FPGK 6 MG/0.6ML ~~LOC~~ SOSY
6.0000 mg | PREFILLED_SYRINGE | Freq: Once | SUBCUTANEOUS | Status: AC
  Administered 2023-07-23: 6 mg via SUBCUTANEOUS
  Filled 2023-07-23: qty 0.6

## 2023-08-04 ENCOUNTER — Inpatient Hospital Stay (HOSPITAL_BASED_OUTPATIENT_CLINIC_OR_DEPARTMENT_OTHER): Payer: Medicare Other | Admitting: Hematology and Oncology

## 2023-08-04 ENCOUNTER — Inpatient Hospital Stay: Payer: Medicare Other

## 2023-08-04 VITALS — BP 116/80 | HR 91 | Temp 97.3°F | Resp 17 | Wt 166.9 lb

## 2023-08-04 VITALS — BP 124/86 | HR 75 | Resp 16

## 2023-08-04 DIAGNOSIS — Z170 Estrogen receptor positive status [ER+]: Secondary | ICD-10-CM

## 2023-08-04 DIAGNOSIS — Z95828 Presence of other vascular implants and grafts: Secondary | ICD-10-CM

## 2023-08-04 DIAGNOSIS — C50411 Malignant neoplasm of upper-outer quadrant of right female breast: Secondary | ICD-10-CM

## 2023-08-04 DIAGNOSIS — Z5111 Encounter for antineoplastic chemotherapy: Secondary | ICD-10-CM | POA: Diagnosis not present

## 2023-08-04 LAB — CBC WITH DIFFERENTIAL (CANCER CENTER ONLY)
Abs Immature Granulocytes: 1.05 10*3/uL — ABNORMAL HIGH (ref 0.00–0.07)
Basophils Absolute: 0.1 10*3/uL (ref 0.0–0.1)
Basophils Relative: 1 %
Eosinophils Absolute: 0 10*3/uL (ref 0.0–0.5)
Eosinophils Relative: 0 %
HCT: 30.9 % — ABNORMAL LOW (ref 36.0–46.0)
Hemoglobin: 10.4 g/dL — ABNORMAL LOW (ref 12.0–15.0)
Immature Granulocytes: 8 %
Lymphocytes Relative: 13 %
Lymphs Abs: 1.7 10*3/uL (ref 0.7–4.0)
MCH: 32.9 pg (ref 26.0–34.0)
MCHC: 33.7 g/dL (ref 30.0–36.0)
MCV: 97.8 fL (ref 80.0–100.0)
Monocytes Absolute: 1.5 10*3/uL — ABNORMAL HIGH (ref 0.1–1.0)
Monocytes Relative: 12 %
Neutro Abs: 8.5 10*3/uL — ABNORMAL HIGH (ref 1.7–7.7)
Neutrophils Relative %: 66 %
Platelet Count: 251 10*3/uL (ref 150–400)
RBC: 3.16 MIL/uL — ABNORMAL LOW (ref 3.87–5.11)
RDW: 16.3 % — ABNORMAL HIGH (ref 11.5–15.5)
WBC Count: 12.7 10*3/uL — ABNORMAL HIGH (ref 4.0–10.5)
nRBC: 0.5 % — ABNORMAL HIGH (ref 0.0–0.2)

## 2023-08-04 LAB — CMP (CANCER CENTER ONLY)
ALT: 16 U/L (ref 0–44)
AST: 15 U/L (ref 15–41)
Albumin: 4.1 g/dL (ref 3.5–5.0)
Alkaline Phosphatase: 95 U/L (ref 38–126)
Anion gap: 4 — ABNORMAL LOW (ref 5–15)
BUN: 12 mg/dL (ref 8–23)
CO2: 27 mmol/L (ref 22–32)
Calcium: 8.9 mg/dL (ref 8.9–10.3)
Chloride: 108 mmol/L (ref 98–111)
Creatinine: 0.74 mg/dL (ref 0.44–1.00)
GFR, Estimated: >60 mL/min (ref >60)
Glucose, Bld: 92 mg/dL (ref 70–99)
Potassium: 4.1 mmol/L (ref 3.5–5.1)
Sodium: 139 mmol/L (ref 135–145)
Total Bilirubin: 0.3 mg/dL (ref 0.0–1.2)
Total Protein: 6.4 g/dL — ABNORMAL LOW (ref 6.5–8.1)

## 2023-08-04 LAB — URINALYSIS, COMPLETE (UACMP) WITH MICROSCOPIC
Bilirubin Urine: NEGATIVE
Glucose, UA: NEGATIVE mg/dL
Hgb urine dipstick: NEGATIVE
Ketones, ur: NEGATIVE mg/dL
Nitrite: POSITIVE — AB
Protein, ur: 30 mg/dL — AB
Specific Gravity, Urine: 1.018 (ref 1.005–1.030)
WBC, UA: >50 WBC/hpf (ref 0–5)
pH: 5 (ref 5.0–8.0)

## 2023-08-04 MED ORDER — HEPARIN SOD (PORK) LOCK FLUSH 100 UNIT/ML IV SOLN: 500.0000 [IU] | Freq: Once | INTRAVENOUS | Status: DC

## 2023-08-04 MED ORDER — SODIUM CHLORIDE 0.9 % IV SOLN: INTRAVENOUS | Status: DC

## 2023-08-04 MED ORDER — DEXAMETHASONE SODIUM PHOSPHATE 10 MG/ML IJ SOLN
10.0000 mg | Freq: Once | INTRAMUSCULAR | Status: AC
Start: 2023-08-04 — End: 2023-08-04
  Administered 2023-08-04: 10 mg via INTRAVENOUS
  Filled 2023-08-04: qty 1

## 2023-08-04 MED ORDER — FLUCONAZOLE 200 MG PO TABS: 200.0000 mg | ORAL_TABLET | Freq: Every day | ORAL | 0 refills | Status: DC

## 2023-08-04 MED ORDER — FAMOTIDINE IN NACL 20-0.9 MG/50ML-% IV SOLN
20.0000 mg | Freq: Once | INTRAVENOUS | Status: AC
  Administered 2023-08-04: 20 mg via INTRAVENOUS
  Filled 2023-08-04: qty 50

## 2023-08-04 MED ORDER — SODIUM CHLORIDE 0.9 % IV SOLN
80.0000 mg/m2 | Freq: Once | INTRAVENOUS | Status: AC
Start: 2023-08-04 — End: 2023-08-04
  Administered 2023-08-04: 150 mg via INTRAVENOUS
  Filled 2023-08-04: qty 25

## 2023-08-04 MED ORDER — SODIUM CHLORIDE 0.9% FLUSH
10.0000 mL | INTRAVENOUS | Status: DC | PRN
  Administered 2023-08-04: 10 mL via INTRAVENOUS

## 2023-08-04 MED ORDER — DIPHENHYDRAMINE HCL 50 MG/ML IJ SOLN
50.0000 mg | Freq: Once | INTRAMUSCULAR | Status: AC
Start: 2023-08-04 — End: 2023-08-04
  Administered 2023-08-04: 50 mg via INTRAVENOUS
  Filled 2023-08-04: qty 1

## 2023-08-04 NOTE — Progress Notes (Signed)
 [facility] CONSULT NOTE  Patient Care Team: [name], NP as PCP - General (Nurse Practitioner) Donnelly Angelica, RN as Oncology Nurse Navigator Pershing Proud, RN as Oncology Nurse Navigator Almond Lint, MD as Consulting Physician (General Surgery) Rachel Moulds, MD as Consulting Physician (Hematology and Oncology) Dorothy Puffer, MD as Consulting Physician (Radiation Oncology)  CHIEF COMPLAINTS/PURPOSE OF CONSULTATION:  Newly diagnosed breast cancer  HISTORY OF PRESENTING ILLNESS:  Brittany [name] 68 y.o. female is here because of recent diagnosis of right breast cancer  I reviewed her records extensively and collaborated the history with the patient.  SUMMARY OF ONCOLOGIC HISTORY: Oncology History  Malignant neoplasm of upper-outer quadrant of right breast in female, estrogen receptor positive (HCC)  02/22/2023 Mammogram   Screening mammogram on February 22, 2023 showed possible mass in the right breast with calcifications warranting further evaluation.  No findings suspicious for malignancy in the left breast.  Diagnostic mammogram done confirmed a persistent irregular mass within the central right breast, adjacent to the mass are slightly heterogeneous calcifications.  Ultrasound showed 1.1 x 1.2 x 1.3 cm irregular hypoechoic mass at 10 o'clock position of the right breast 1 cm from the nipple, no abnormal right axillary lymph nodes are noted.   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  Prognostic showed ER 100% positive strong staining PR 95% positive strong staining Ki-67 of 5% and HER2 2+   03/14/2023 Initial Diagnosis   Malignant neoplasm of upper-outer quadrant of right breast in female, estrogen receptor positive (HCC)    Genetic Testing   [facility] CancerNext-Expanded Panel+RNA was Negative. Report date is  03/27/2023.   The CancerNext-Expanded gene panel offered by [facility] and includes sequencing, rearrangement, and RNA analysis for the following 71 genes: AIP, ALK, APC, ATM, AXIN2, BAP1, BARD1, BMPR1A, BRCA1, BRCA2, BRIP1, CDC73, CDH1, CDK4, CDKN1B, CDKN2A, CHEK2, CTNNA1, DICER1, FH, FLCN, KIF1B, LZTR1, MAX, MEN1, MET, MLH1, MSH2, MSH3, MSH6, MUTYH, NF1, NF2, NTHL1, PALB2, PHOX2B, PMS2, POT1, PRKAR1A, PTCH1, PTEN, RAD51C, RAD51D, RB1, RET, SDHA, SDHAF2, SDHB, SDHC, SDHD, SMAD4, SMARCA4, SMARCB1, SMARCE1, STK11, SUFU, TMEM127, TP53, TSC1, TSC2, and VHL (sequencing and deletion/duplication); EGFR, EGLN1, HOXB13, KIT, MITF, PDGFRA, POLD1, and POLE (sequencing only); EPCAM and GREM1 (deletion/duplication only).    05/31/2023 Cancer Staging   Staging form: Breast, AJCC 8th Edition - Pathologic stage from 05/31/2023: Stage IB (pT1c, pN2a, cM0, G2, ER+, PR+, HER2-) - Signed by Rachel Moulds, MD on 05/31/2023 Stage prefix: Initial diagnosis Method of lymph node assessment: Axillary lymph node dissection Histologic grading system: 3 grade system   06/09/2023 -  Chemotherapy   Patient is on Treatment Plan : BREAST DOSE DENSE AC q14d / PACLitaxel q7d      Discussed the use of AI scribe software for clinical note transcription with the patient, who gave verbal consent to proceed.  History of Present Illness    Brittany [name] is a 68 year old female undergoing chemotherapy who presents with a burning sensation during urination and possible yeast infection.  She experiences a burning sensation during urination, described as a 'fire down there', and suspects a yeast infection. There is no pain in the lower abdomen, and she can urinate without difficulty. She notes a slight increase in urination frequency.  She is currently undergoing chemotherapy with a new regimen expected to cause minimal nausea compared to her  previous treatment. She is aware of potential side effects, including neuropathy, ankle  swelling, and acne. She uses ice and considers supplements like B12 and alpha-lipoic acid to manage neuropathy symptoms.  Rest of the pertinent 10 point ROS reviewed and negative  MEDICAL HISTORY:  Past Medical History:  Diagnosis Date   Anxiety and depression    Elevated LDL cholesterol level    Elevated liver enzymes 07/23/2021   GERD (gastroesophageal reflux disease)    Malignant neoplasm of breast (female) (HCC)    Thyroid disorder 11/14/2017    SURGICAL HISTORY: Past Surgical History:  Procedure Laterality Date   AXILLARY LYMPH NODE DISSECTION Right 05/18/2023   Procedure: RIGHT AXILLARY LYMPH NODE DISSECTION;  Surgeon: Almond Lint, MD;  Location: [facility] OR;  Service: General;  Laterality: Right;  PEC BLOCK   BREAST BIOPSY Right 03/09/2023   [place] RT BREAST BX W LOC DEV 1ST LESION IMG BX SPEC US GUIDE 03/09/2023 [facility] MAMMOGRAPHY   BREAST BIOPSY  04/12/2023   [place] RT RADIOACTIVE SEED LOC 04/12/2023 [facility] ULTRASOUND   BREAST LUMPECTOMY WITH RADIOACTIVE SEED AND SENTINEL LYMPH NODE BIOPSY Right 04/13/2023   Procedure: RIGHT BREAST SEED LOCALIZED LUMPECTOMY WITH SENTINEL NODE BIOPSY;  Surgeon: Almond Lint, MD;  [facility];  Service: General;  Laterality: Right;   HERNIA REPAIR     LAPAROSCOPIC HYSTERECTOMY  03/2017   Partial, pt still has both adnexa   PORTACATH PLACEMENT N/A 05/18/2023   Procedure: PORT PLACEMENT WITH ULTRASOUND GUIDANCE;  Surgeon: Almond Lint, MD;  Location: [facility] OR;  Service: General;  Laterality: N/A;    SOCIAL HISTORY: Social History   Socioeconomic History   Marital status: Divorced    Spouse name: Not on file   Number of children: Not on file   Years of education: Not on file   Highest education level: Master's degree (e.g., MA, MS, MEng, MEd, MSW, MBA)  Occupational History   Not on file  Tobacco Use   Smoking status: Former   Smokeless tobacco: Never   Tobacco comments:    stopped at age 32. smoked for 4-5 years 1 ppd  Vaping  Use   Vaping status: Never Used  Substance and Sexual Activity   Alcohol use: Yes    Comment: WINE OCC   Drug use: Never   Sexual activity: Not Currently    Partners: Male    Birth control/protection: Surgical    Comment: hysterectomy  Other Topics Concern   Not on file  Social History Narrative   Not on file   Social Drivers of Health   Financial Resource Strain: Low Risk  (06/20/2023)   Overall Financial Resource Strain (CARDIA)    Difficulty of Paying Living Expenses: Not very hard  Food Insecurity: No Food Insecurity (06/20/2023)   Hunger Vital Sign    Worried About Running Out of Food in the Last Year: Never true    Ran Out of Food in the Last Year: Never true  Transportation Needs: No Transportation Needs (06/20/2023)   PRAPARE - Administrator, Civil Service (Medical): No    Lack of Transportation (Non-Medical): No  Physical Activity: Insufficiently Active (06/20/2023)   Exercise Vital Sign    Days of Exercise per Week: 3 days    Minutes of Exercise per Session: 30 min  Stress: Stress Concern Present (06/20/2023)   [employer] of Occupational Health - Occupational Stress Questionnaire    Feeling of Stress : To some extent  Social Connections: Unknown (06/20/2023)  Social Advertising account executive [NHANES]    Frequency of Communication with Friends and Family: More than three times a week    Frequency of Social Gatherings with Friends and Family: Once a week    Attends Religious Services: Patient declined    Database administrator or Organizations: No    Attends Engineer, structural: Not on file    Marital Status: Divorced  Intimate Partner Violence: Not At Risk (03/16/2023)   Humiliation, Afraid, Rape, and Kick questionnaire    Fear of Current or Ex-Partner: No    Emotionally Abused: No    Physically Abused: No    Sexually Abused: No    FAMILY HISTORY: Family History  Problem Relation Age of Onset   Breast cancer Mother 71    Diabetes Mother    Hypertension Mother    Cancer Father 69 - 36       liver?   Diabetes Brother    Cancer Paternal Uncle    Breast cancer Paternal Grandmother 30    ALLERGIES:  is allergic to wellbutrin [bupropion].  MEDICATIONS:  Current Outpatient Medications  Medication Sig Dispense Refill   fluconazole (DIFLUCAN) 200 MG tablet Take 1 tablet (200 mg total) by mouth daily. 2 tablet 0   Biotin 5000 MCG CAPS Take 1 capsule by mouth daily.     cycloSPORINE (RESTASIS) 0.05 % ophthalmic emulsion Place 1 drop into both eyes 2 (two) times daily.     dexamethasone (DECADRON) 4 MG tablet Take 2 tablets (8 mg total) by mouth daily for 3 days. Start the day after doxorubicin/cyclophosphamide chemotherapy. Take with food. (Patient not taking: Reported on 05/26/2023) 30 tablet 1   EVENING PRIMROSE OIL PO Take 1 tablet by mouth daily.     Flaxseed, Linseed, (FLAX SEED OIL PO) Take 1 capsule by mouth daily.     glucosamine-chondroitin 500-400 MG tablet Take 1 tablet by mouth daily.     lidocaine-prilocaine (EMLA) cream Apply to affected area once (Patient not taking: Reported on 05/26/2023) 30 g 3   MAGNESIUM GLYCINATE PO Take 350 mg by mouth daily.     Methylsulfonylmethane (MSM) 1000 MG TABS Take 1 tablet by mouth 3 (three) times a week.     milk thistle 175 MG tablet Take 175 mg by mouth daily.     ondansetron (ZOFRAN) 8 MG tablet Take 1 tab (8 mg) by mouth every 8 hrs as needed for nausea/vomiting. Start third day after doxorubicin/cyclophosphamide chemotherapy. (Patient not taking: Reported on 05/26/2023) 30 tablet 1   OVER THE COUNTER MEDICATION Holy basil -   5 times a week     PARoxetine (PAXIL) 10 MG tablet Take 1 tablet (10 mg total) by mouth daily. 90 tablet 1   prochlorperazine (COMPAZINE) 10 MG tablet Take 1 tablet (10 mg total) by mouth every 6 (six) hours as needed for nausea or vomiting. (Patient not taking: Reported on 05/26/2023) 30 tablet 1   pyridoxine (B-6) 100 MG tablet Take 100 mg  by mouth daily. (Patient not taking: Reported on 05/26/2023)     vitamin B-12 (CYANOCOBALAMIN) 500 MCG tablet Take 500 mcg by mouth daily. (Patient not taking: Reported on 05/26/2023)     vitamin C (ASCORBIC ACID) 250 MG tablet Take 250 mg by mouth daily. (Patient not taking: Reported on 05/26/2023)     VITAMIN D PO Take 1 tablet by mouth daily.     No current facility-administered medications for this visit.   Facility-Administered Medications Ordered in Other Visits  Medication Dose Route Frequency Provider Last Rate Last Admin   0.9 %  sodium chloride infusion   Intravenous Continuous Onika Gudiel, Burnice Logan, MD 10 mL/hr at 08/04/23 1240 New Bag at 08/04/23 1240   dexamethasone (DECADRON) injection 10 mg  10 mg Intravenous Once Cinnamon Morency, Burnice Logan, MD       diphenhydrAMINE (BENADRYL) injection 50 mg  50 mg Intravenous Once Pretty Weltman, Burnice Logan, MD       famotidine (PEPCID) IVPB 20 mg premix  20 mg Intravenous Once Calena Salem, Burnice Logan, MD       PACLitaxel (TAXOL) 150 mg in sodium chloride 0.9 % 250 mL chemo infusion (</= 80mg /m2)  80 mg/m2 (Treatment Plan Recorded) Intravenous Once Alyza Artiaga, Burnice Logan, MD        REVIEW OF SYSTEMS:   Constitutional: Denies fevers, chills or abnormal night sweats Eyes: Denies blurriness of vision, double vision or watery eyes Ears, nose, mouth, throat, and face: Denies mucositis or sore throat Respiratory: Denies cough, dyspnea or wheezes Cardiovascular: Denies palpitation, chest discomfort or lower extremity swelling Gastrointestinal:  Denies nausea, heartburn or change in bowel habits Skin: Denies abnormal skin rashes Lymphatics: Denies new lymphadenopathy or easy bruising Neurological:Denies numbness, tingling or new weaknesses Behavioral/Psych: Mood is stable, no new changes  Breast: Denies any palpable lumps or discharge All other systems were reviewed with the patient and are negative.  PHYSICAL EXAMINATION: ECOG PERFORMANCE STATUS: 0 - Asymptomatic  Vitals:   08/04/23  1151  BP: 116/80  Pulse: 91  Resp: 17  Temp: (!) 97.3 F (36.3 C)  SpO2: 97%     Filed Weights   08/04/23 1151  Weight: 166 lb 14.4 oz (75.7 kg)    GENERAL:alert, no distress and comfortable Chest: CTA bilaterally Heart: RRR No LE swelling  LABORATORY DATA:  I have reviewed the data as listed Lab Results  Component Value Date   WBC 12.7 (H) 08/04/2023   HGB 10.4 (L) 08/04/2023   HCT 30.9 (L) 08/04/2023   MCV 97.8 08/04/2023   PLT 251 08/04/2023   Lab Results  Component Value Date   NA 139 08/04/2023   K 4.1 08/04/2023   CL 108 08/04/2023   CO2 27 08/04/2023    RADIOGRAPHIC STUDIES: I have personally reviewed the radiological reports and agreed with the findings in the report.  ASSESSMENT AND PLAN:  Malignant neoplasm of upper-outer quadrant of right breast in female, estrogen receptor positive (HCC) Chemotherapy management   Undergoing chemotherapy with expected minimal nausea. Risk of neuropathy and allergic reactions during initial infusions. Discussed common Taxol side effects.   - Administer fluconazole for yeast infection.   - Advise use of Vagisil wipes for symptomatic relief.   - Monitor for neuropathy and allergic reactions during chemotherapy.   - Educate on potential side effects of Taxol, including ankle swelling and acne, allergic reactions and neuropathy etc  Yeast infection   Burning sensations likely due to chemotherapy-induced yeast infection.   - Request urine sample to rule out urinary tract infection.    Neuropathy   Risk due to chemotherapy. Using ice and considering B12.   - Advise continued use of ice for neuropathy management.    Follow-up   No immediate follow-up needed.  - Schedule follow-up appointments every other week.   - Review blood work results as they become available.     All questions were answered. The patient knows to call the clinic with any problems, questions or concerns.    Rachel Moulds, MD 08/04/23

## 2023-08-04 NOTE — Patient Instructions (Signed)

## 2023-08-04 NOTE — Assessment & Plan Note (Signed)
 Chemotherapy management   Undergoing chemotherapy with expected minimal nausea. Risk of neuropathy and allergic reactions during initial infusions. Discussed common Taxol side effects.   - Administer fluconazole for yeast infection.   - Advise use of Vagisil wipes for symptomatic relief.   - Monitor for neuropathy and allergic reactions during chemotherapy.   - Educate on potential side effects of Taxol, including ankle swelling and acne, allergic reactions and neuropathy etc  Yeast infection   Burning sensations likely due to chemotherapy-induced yeast infection.   - Request urine sample to rule out urinary tract infection.    Neuropathy   Risk due to chemotherapy. Using ice and considering B12.   - Advise continued use of ice for neuropathy management.    Follow-up   No immediate follow-up needed.  - Schedule follow-up appointments every other week.   - Review blood work results as they become available.

## 2023-08-04 NOTE — Patient Instructions (Signed)
 [facility] - A DEPT OF [facility]  Discharge Instructions: Thank you for choosing [facility] to provide your oncology and hematology care.   If you have a lab appointment with the Cancer Center, please go directly to the Cancer Center and check in at the registration area.   Wear comfortable clothing and clothing appropriate for easy access to any Portacath or PICC line.   We strive to give you quality time with your provider. You may need to reschedule your appointment if you arrive late (15 or more minutes).  Arriving late affects you and other patients whose appointments are after yours.  Also, if you miss three or more appointments without notifying the office, you may be dismissed from the clinic at the provider's discretion.      For prescription refill requests, have your pharmacy contact our office and allow 72 hours for refills to be completed.    Today you received the following chemotherapy and/or immunotherapy agents: Paclitaxel      To help prevent nausea and vomiting after your treatment, we encourage you to take your nausea medication as directed.  BELOW ARE SYMPTOMS THAT SHOULD BE REPORTED IMMEDIATELY: *FEVER GREATER THAN 100.4 F (38 C) OR HIGHER *CHILLS OR SWEATING *NAUSEA AND VOMITING THAT IS NOT CONTROLLED WITH YOUR NAUSEA MEDICATION *UNUSUAL SHORTNESS OF BREATH *UNUSUAL BRUISING OR BLEEDING *URINARY PROBLEMS (pain or burning when urinating, or frequent urination) *BOWEL PROBLEMS (unusual diarrhea, constipation, pain near the anus) TENDERNESS IN MOUTH AND THROAT WITH OR WITHOUT PRESENCE OF ULCERS (sore throat, sores in mouth, or a toothache) UNUSUAL RASH, SWELLING OR PAIN  UNUSUAL VAGINAL DISCHARGE OR ITCHING   Items with * indicate a potential emergency and should be followed up as soon as possible or go to the Emergency Department if any problems should occur.  Please show the CHEMOTHERAPY ALERT CARD or IMMUNOTHERAPY  ALERT CARD at check-in to the Emergency Department and triage nurse.  Should you have questions after your visit or need to cancel or reschedule your appointment, please contact [facility] - A DEPT OF [facility]  Dept: [phone]  and follow the prompts.  Office hours are 8:00 a.m. to 4:30 p.m. Monday - Friday. Please note that voicemails left after 4:00 p.m. may not be returned until the following business day.  We are closed weekends and major holidays. You have access to a nurse at all times for urgent questions. Please call the main number to the clinic Dept: [phone] and follow the prompts.   For any non-urgent questions, you may also contact your provider using MyChart. We now offer e-Visits for anyone 37 and older to request care online for non-urgent symptoms. For details visit [email or web address].   Also download the MyChart app! Go to the app store, search "MyChart", open the app, select [facility], and log in with your MyChart username and password.  Paclitaxel Injection What is this medication? PACLITAXEL (PAK li TAX el) treats some types of cancer. It works by slowing down the growth of cancer cells. This medicine may be used for other purposes; ask your health care provider or pharmacist if you have questions. COMMON BRAND NAME(S): Onxol, Taxol What should I tell my care team before I take this medication? They need to know if you have any of these conditions: Heart disease Liver disease Low white blood cell levels An unusual or allergic reaction to paclitaxel, other medications, foods,  dyes, or preservatives If you or your partner are pregnant or trying to get pregnant Breast-feeding How should I use this medication? This medication is injected into a vein. It is given by your care team in a hospital or clinic setting. Talk to your care team about the use of this medication in children. While it may be given to children for selected  conditions, precautions do apply. Overdosage: If you think you have taken too much of this medicine contact a poison control center or emergency room at once. NOTE: This medicine is only for you. Do not share this medicine with others. What if I miss a dose? Keep appointments for follow-up doses. It is important not to miss your dose. Call your care team if you are unable to keep an appointment. What may interact with this medication? Do not take this medication with any of the following: Live virus vaccines Other medications may affect the way this medication works. Talk with your care team about all of the medications you take. They may suggest changes to your treatment plan to lower the risk of side effects and to make sure your medications work as intended. This list may not describe all possible interactions. Give your health care provider a list of all the medicines, herbs, non-prescription drugs, or dietary supplements you use. Also tell them if you smoke, drink alcohol, or use illegal drugs. Some items may interact with your medicine. What should I watch for while using this medication? Your condition will be monitored carefully while you are receiving this medication. You may need blood work while taking this medication. This medication may make you feel generally unwell. This is not uncommon as chemotherapy can affect healthy cells as well as cancer cells. Report any side effects. Continue your course of treatment even though you feel ill unless your care team tells you to stop. This medication can cause serious allergic reactions. To reduce the risk, your care team may give you other medications to take before receiving this one. Be sure to follow the directions from your care team. This medication may increase your risk of getting an infection. Call your care team for advice if you get a fever, chills, sore throat, or other symptoms of a cold or flu. Do not treat yourself. Try to avoid  being around people who are sick. This medication may increase your risk to bruise or bleed. Call your care team if you notice any unusual bleeding. Be careful brushing or flossing your teeth or using a toothpick because you may get an infection or bleed more easily. If you have any dental work done, tell your dentist you are receiving this medication. Talk to your care team if you may be pregnant. Serious birth defects can occur if you take this medication during pregnancy. Talk to your care team before breastfeeding. Changes to your treatment plan may be needed. What side effects may I notice from receiving this medication? Side effects that you should report to your care team as soon as possible: Allergic reactions--skin rash, itching, hives, swelling of the face, lips, tongue, or throat Heart rhythm changes--fast or irregular heartbeat, dizziness, feeling faint or lightheaded, chest pain, trouble breathing Increase in blood pressure Infection--fever, chills, cough, sore throat, wounds that don't heal, pain or trouble when passing urine, general feeling of discomfort or being unwell Low blood pressure--dizziness, feeling faint or lightheaded, blurry vision Low red blood cell level--unusual weakness or fatigue, dizziness, headache, trouble breathing Painful swelling, warmth, or redness of  the skin, blisters or sores at the infusion site Pain, tingling, or numbness in the hands or feet Slow heartbeat--dizziness, feeling faint or lightheaded, confusion, trouble breathing, unusual weakness or fatigue Unusual bruising or bleeding Side effects that usually do not require medical attention (report to your care team if they continue or are bothersome): Diarrhea Hair loss Joint pain Loss of appetite Muscle pain Nausea Vomiting This list may not describe all possible side effects. Call your doctor for medical advice about side effects. You may report side effects to FDA at 1-800-FDA-1088. Where  should I keep my medication? This medication is given in a hospital or clinic. It will not be stored at home. NOTE: This sheet is a summary. It may not cover all possible information. If you have questions about this medicine, talk to your doctor, pharmacist, or health care provider.  2024 Elsevier/Gold Standard (2021-09-15 00:00:00)

## 2023-08-05 ENCOUNTER — Encounter: Payer: Self-pay | Admitting: Hematology and Oncology

## 2023-08-05 NOTE — Telephone Encounter (Signed)
 Called & left message to call back to discuss how she did with her recent change in therapy.

## 2023-08-05 NOTE — Telephone Encounter (Signed)
-----   Message from Nurse Currie Paris sent at 08/04/2023  3:11 PM EDT ----- Regarding: First time Paclitaxel. Pt of [place] First time Paclitaxel. Pt of [place]. Tolerated well. Has received AC previously. Please call to check in with pt when possible, thanks!

## 2023-08-08 ENCOUNTER — Other Ambulatory Visit: Payer: Self-pay | Admitting: *Deleted

## 2023-08-08 ENCOUNTER — Telehealth: Payer: Self-pay | Admitting: *Deleted

## 2023-08-08 MED ORDER — CIPROFLOXACIN HCL 500 MG PO TABS: 500.0000 mg | ORAL_TABLET | Freq: Two times a day (BID) | ORAL | 0 refills | Status: DC

## 2023-08-08 NOTE — Telephone Encounter (Addendum)
 Pt notified - pharmacy verified and prescription sent.  ----- Message from Brittany [name] sent at 08/08/2023 10:43 AM EDT ----- Can we send her cipro 500 mg po BID for a possibel UTI  Thanks

## 2023-08-11 ENCOUNTER — Ambulatory Visit: Payer: Medicare Other | Admitting: Hematology and Oncology

## 2023-08-11 ENCOUNTER — Inpatient Hospital Stay: Payer: Medicare Other

## 2023-08-11 ENCOUNTER — Inpatient Hospital Stay: Payer: Medicare Other | Attending: Hematology and Oncology

## 2023-08-11 VITALS — BP 120/77 | HR 86 | Temp 98.5°F | Resp 16 | Wt 170.8 lb

## 2023-08-11 DIAGNOSIS — Z1732 Human epidermal growth factor receptor 2 negative status: Secondary | ICD-10-CM | POA: Insufficient documentation

## 2023-08-11 DIAGNOSIS — R300 Dysuria: Secondary | ICD-10-CM | POA: Insufficient documentation

## 2023-08-11 DIAGNOSIS — Z87891 Personal history of nicotine dependence: Secondary | ICD-10-CM | POA: Insufficient documentation

## 2023-08-11 DIAGNOSIS — Z170 Estrogen receptor positive status [ER+]: Secondary | ICD-10-CM | POA: Insufficient documentation

## 2023-08-11 DIAGNOSIS — Z5111 Encounter for antineoplastic chemotherapy: Secondary | ICD-10-CM | POA: Insufficient documentation

## 2023-08-11 DIAGNOSIS — C50411 Malignant neoplasm of upper-outer quadrant of right female breast: Secondary | ICD-10-CM | POA: Insufficient documentation

## 2023-08-11 DIAGNOSIS — Z1721 Progesterone receptor positive status: Secondary | ICD-10-CM | POA: Insufficient documentation

## 2023-08-11 DIAGNOSIS — Z95828 Presence of other vascular implants and grafts: Secondary | ICD-10-CM

## 2023-08-11 LAB — CMP (CANCER CENTER ONLY)
ALT: 31 U/L (ref 0–44)
AST: 20 U/L (ref 15–41)
Albumin: 4.1 g/dL (ref 3.5–5.0)
Alkaline Phosphatase: 77 U/L (ref 38–126)
Anion gap: 6 (ref 5–15)
BUN: 11 mg/dL (ref 8–23)
CO2: 25 mmol/L (ref 22–32)
Calcium: 8.8 mg/dL — ABNORMAL LOW (ref 8.9–10.3)
Chloride: 107 mmol/L (ref 98–111)
Creatinine: 0.82 mg/dL (ref 0.44–1.00)
GFR, Estimated: >60 mL/min (ref >60)
Glucose, Bld: 133 mg/dL — ABNORMAL HIGH (ref 70–99)
Potassium: 3.5 mmol/L (ref 3.5–5.1)
Sodium: 138 mmol/L (ref 135–145)
Total Bilirubin: 0.3 mg/dL (ref 0.0–1.2)
Total Protein: 6.2 g/dL — ABNORMAL LOW (ref 6.5–8.1)

## 2023-08-11 LAB — CBC WITH DIFFERENTIAL (CANCER CENTER ONLY)
Abs Immature Granulocytes: 0.04 10*3/uL (ref 0.00–0.07)
Basophils Absolute: 0 10*3/uL (ref 0.0–0.1)
Basophils Relative: 1 %
Eosinophils Absolute: 0.1 10*3/uL (ref 0.0–0.5)
Eosinophils Relative: 1 %
HCT: 28.7 % — ABNORMAL LOW (ref 36.0–46.0)
Hemoglobin: 9.6 g/dL — ABNORMAL LOW (ref 12.0–15.0)
Immature Granulocytes: 1 %
Lymphocytes Relative: 20 %
Lymphs Abs: 1.1 10*3/uL (ref 0.7–4.0)
MCH: 33 pg (ref 26.0–34.0)
MCHC: 33.4 g/dL (ref 30.0–36.0)
MCV: 98.6 fL (ref 80.0–100.0)
Monocytes Absolute: 0.8 10*3/uL (ref 0.1–1.0)
Monocytes Relative: 14 %
Neutro Abs: 3.5 10*3/uL (ref 1.7–7.7)
Neutrophils Relative %: 63 %
Platelet Count: 428 10*3/uL — ABNORMAL HIGH (ref 150–400)
RBC: 2.91 MIL/uL — ABNORMAL LOW (ref 3.87–5.11)
RDW: 16 % — ABNORMAL HIGH (ref 11.5–15.5)
WBC Count: 5.5 10*3/uL (ref 4.0–10.5)
nRBC: 0 % (ref 0.0–0.2)

## 2023-08-11 MED ORDER — DEXAMETHASONE SODIUM PHOSPHATE 10 MG/ML IJ SOLN
10.0000 mg | Freq: Once | INTRAMUSCULAR | Status: AC
  Administered 2023-08-11: 10 mg via INTRAVENOUS
  Filled 2023-08-11: qty 1

## 2023-08-11 MED ORDER — SODIUM CHLORIDE 0.9 % IV SOLN: INTRAVENOUS | Status: DC

## 2023-08-11 MED ORDER — SODIUM CHLORIDE 0.9% FLUSH
10.0000 mL | INTRAVENOUS | Status: DC | PRN
  Administered 2023-08-11: 10 mL

## 2023-08-11 MED ORDER — DIPHENHYDRAMINE HCL 50 MG/ML IJ SOLN
50.0000 mg | Freq: Once | INTRAMUSCULAR | Status: AC
  Administered 2023-08-11: 50 mg via INTRAVENOUS
  Filled 2023-08-11: qty 1

## 2023-08-11 MED ORDER — SODIUM CHLORIDE 0.9% FLUSH
10.0000 mL | Freq: Once | INTRAVENOUS | Status: AC
  Administered 2023-08-11: 10 mL

## 2023-08-11 MED ORDER — FAMOTIDINE IN NACL 20-0.9 MG/50ML-% IV SOLN
20.0000 mg | Freq: Once | INTRAVENOUS | Status: AC
Start: 2023-08-11 — End: 2023-08-11
  Administered 2023-08-11: 20 mg via INTRAVENOUS
  Filled 2023-08-11: qty 50

## 2023-08-11 MED ORDER — HEPARIN SOD (PORK) LOCK FLUSH 100 UNIT/ML IV SOLN
500.0000 [IU] | Freq: Once | INTRAVENOUS | Status: AC | PRN
  Administered 2023-08-11: 500 [IU]

## 2023-08-11 MED ORDER — SODIUM CHLORIDE 0.9 % IV SOLN
80.0000 mg/m2 | Freq: Once | INTRAVENOUS | Status: AC
  Administered 2023-08-11: 150 mg via INTRAVENOUS
  Filled 2023-08-11: qty 25

## 2023-08-11 NOTE — Patient Instructions (Signed)

## 2023-08-15 ENCOUNTER — Ambulatory Visit: Payer: Medicare Other | Attending: General Surgery

## 2023-08-15 VITALS — Wt 168.2 lb

## 2023-08-15 DIAGNOSIS — Z170 Estrogen receptor positive status [ER+]: Secondary | ICD-10-CM | POA: Insufficient documentation

## 2023-08-15 DIAGNOSIS — C50411 Malignant neoplasm of upper-outer quadrant of right female breast: Secondary | ICD-10-CM | POA: Insufficient documentation

## 2023-08-15 NOTE — Therapy (Signed)
 OUTPATIENT PHYSICAL THERAPY SOZO SCREENING NOTE   Patient Name: Brittany [name] MRN: [id number] DOB:[birth year], 69 y.o., female Today's Date: 08/15/2023  PCP: [name], NP REFERRING PROVIDER: Almond Lint, MD   PT End of Session - 08/15/23 [phone]     Visit Number 3   # unchanged due to screen only   PT Start Time 0958    PT Stop Time 1002    PT Time Calculation (min) 4 min    Activity Tolerance Patient tolerated treatment well    Behavior During Therapy [facility] for tasks assessed/performed             Past Medical History:  Diagnosis Date   Anxiety and depression    Elevated LDL cholesterol level    Elevated liver enzymes 07/23/2021   GERD (gastroesophageal reflux disease)    Malignant neoplasm of breast (female) (HCC)    Thyroid disorder 11/14/2017   Past Surgical History:  Procedure Laterality Date   AXILLARY LYMPH NODE DISSECTION Right 05/18/2023   Procedure: RIGHT AXILLARY LYMPH NODE DISSECTION;  Surgeon: Almond Lint, MD;  Location: [facility];  Service: General;  Laterality: Right;  PEC BLOCK   BREAST BIOPSY Right 03/09/2023   [place] RT BREAST BX W LOC DEV 1ST LESION IMG BX SPEC US GUIDE 03/09/2023 [facility] MAMMOGRAPHY   BREAST BIOPSY  04/12/2023   [place] RT RADIOACTIVE SEED LOC 04/12/2023 [facility] ULTRASOUND   BREAST LUMPECTOMY WITH RADIOACTIVE SEED AND SENTINEL LYMPH NODE BIOPSY Right 04/13/2023   Procedure: RIGHT BREAST SEED LOCALIZED LUMPECTOMY WITH SENTINEL NODE BIOPSY;  Surgeon: Almond Lint, MD;  Location: [facility];  Service: General;  Laterality: Right;   HERNIA REPAIR     LAPAROSCOPIC HYSTERECTOMY  03/2017   Partial, pt still has both adnexa   PORTACATH PLACEMENT N/A 05/18/2023   Procedure: PORT PLACEMENT WITH ULTRASOUND GUIDANCE;  Surgeon: Almond Lint, MD;  Location: [facility];  Service: General;  Laterality: N/A;   Patient Active Problem List   Diagnosis Date Noted   Port-A-Cath in place 05/31/2023   Genetic testing 03/28/2023   Family history of  breast cancer 03/16/2023   Malignant neoplasm of upper-outer quadrant of right breast in female, estrogen receptor positive (HCC) 03/14/2023   Arthralgia of right knee 01/28/2023   Pneumococcal vaccination administered at current visit 01/28/2023   Decreased GFR 12/25/2021   Encounter for general adult medical examination with abnormal findings 07/23/2021   Overweight (BMI 25.0-29.9) 07/23/2021   Hyperlipidemia 07/23/2021   Prediabetes 07/23/2021   Ceruminosis, 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 It measures 1.3 cm and is located in the upper outer quadrant. It is ER/PR positive and HER2 negative with a Ki67 of 5% . She is now s/p Right lumpectomy with SLNB and 4+/5 LN's on 04/13/2023. She  had an ALND on 05/17/2022 with 5+/13 LN'. Her drain was pulled on 06/08/2023 and she has had some clear drainage from a seroma.She will be having chemotherapy, and likely radiation and adjuvant CDK 4 6 inhibition with antiestrogen therapy.   PRECAUTIONS: right UE Lymphedema risk, None  SUBJECTIVE:  Pt here for her first 3 month L-Dex screen.   PAIN:  Are you having pain? No  SOZO SCREENING: Patient was assessed today using the SOZO machine to determine the lymphedema index score. This was compared to her baseline score. It was determined that she is within the recommended range when compared to her baseline and no further action is needed at this time. She will continue SOZO screenings. These are done every 3 months for 2 years post  operatively followed by every 6 months for 2 years, and then annually.   L-DEX FLOWSHEETS - 08/15/23 1000       L-DEX LYMPHEDEMA SCREENING   Measurement Type Unilateral    L-DEX MEASUREMENT EXTREMITY Upper Extremity    POSITION  Standing    DOMINANT SIDE Right    At Risk Side Right    BASELINE SCORE (UNILATERAL) 2.1    L-DEX SCORE (UNILATERAL) 5.9    VALUE CHANGE (UNILAT) 3.8              Hermenia Bers, PTA 08/15/2023, 10:02 AM

## 2023-08-18 ENCOUNTER — Inpatient Hospital Stay: Payer: Medicare Other

## 2023-08-18 ENCOUNTER — Inpatient Hospital Stay (HOSPITAL_BASED_OUTPATIENT_CLINIC_OR_DEPARTMENT_OTHER): Payer: Medicare Other | Admitting: Hematology and Oncology

## 2023-08-18 ENCOUNTER — Other Ambulatory Visit: Payer: Self-pay

## 2023-08-18 VITALS — BP 111/68 | HR 88 | Temp 97.3°F | Resp 16 | Wt 170.6 lb

## 2023-08-18 DIAGNOSIS — Z5111 Encounter for antineoplastic chemotherapy: Secondary | ICD-10-CM | POA: Diagnosis not present

## 2023-08-18 DIAGNOSIS — Z170 Estrogen receptor positive status [ER+]: Secondary | ICD-10-CM

## 2023-08-18 DIAGNOSIS — C50411 Malignant neoplasm of upper-outer quadrant of right female breast: Secondary | ICD-10-CM | POA: Diagnosis not present

## 2023-08-18 DIAGNOSIS — Z95828 Presence of other vascular implants and grafts: Secondary | ICD-10-CM

## 2023-08-18 LAB — CBC WITH DIFFERENTIAL (CANCER CENTER ONLY)
Abs Immature Granulocytes: 0.04 10*3/uL (ref 0.00–0.07)
Basophils Absolute: 0.1 10*3/uL (ref 0.0–0.1)
Basophils Relative: 1 %
Eosinophils Absolute: 0.2 10*3/uL (ref 0.0–0.5)
Eosinophils Relative: 3 %
HCT: 28.5 % — ABNORMAL LOW (ref 36.0–46.0)
Hemoglobin: 9.8 g/dL — ABNORMAL LOW (ref 12.0–15.0)
Immature Granulocytes: 1 %
Lymphocytes Relative: 27 %
Lymphs Abs: 1.3 10*3/uL (ref 0.7–4.0)
MCH: 33.4 pg (ref 26.0–34.0)
MCHC: 34.4 g/dL (ref 30.0–36.0)
MCV: 97.3 fL (ref 80.0–100.0)
Monocytes Absolute: 0.6 10*3/uL (ref 0.1–1.0)
Monocytes Relative: 13 %
Neutro Abs: 2.7 10*3/uL (ref 1.7–7.7)
Neutrophils Relative %: 55 %
Platelet Count: 335 10*3/uL (ref 150–400)
RBC: 2.93 MIL/uL — ABNORMAL LOW (ref 3.87–5.11)
RDW: 16.9 % — ABNORMAL HIGH (ref 11.5–15.5)
WBC Count: 4.8 10*3/uL (ref 4.0–10.5)
nRBC: 0 % (ref 0.0–0.2)

## 2023-08-18 LAB — CMP (CANCER CENTER ONLY)
ALT: 46 U/L — ABNORMAL HIGH (ref 0–44)
AST: 28 U/L (ref 15–41)
Albumin: 4 g/dL (ref 3.5–5.0)
Alkaline Phosphatase: 63 U/L (ref 38–126)
Anion gap: 4 — ABNORMAL LOW (ref 5–15)
BUN: 13 mg/dL (ref 8–23)
CO2: 27 mmol/L (ref 22–32)
Calcium: 8.7 mg/dL — ABNORMAL LOW (ref 8.9–10.3)
Chloride: 109 mmol/L (ref 98–111)
Creatinine: 0.77 mg/dL (ref 0.44–1.00)
GFR, Estimated: >60 mL/min (ref >60)
Glucose, Bld: 94 mg/dL (ref 70–99)
Potassium: 4.1 mmol/L (ref 3.5–5.1)
Sodium: 140 mmol/L (ref 135–145)
Total Bilirubin: 0.4 mg/dL (ref 0.0–1.2)
Total Protein: 6.2 g/dL — ABNORMAL LOW (ref 6.5–8.1)

## 2023-08-18 MED ORDER — SODIUM CHLORIDE 0.9 % IV SOLN: INTRAVENOUS | Status: DC

## 2023-08-18 MED ORDER — SODIUM CHLORIDE 0.9% FLUSH
10.0000 mL | Freq: Once | INTRAVENOUS | Status: AC
  Administered 2023-08-18: 10 mL

## 2023-08-18 MED ORDER — DEXAMETHASONE SODIUM PHOSPHATE 10 MG/ML IJ SOLN
10.0000 mg | Freq: Once | INTRAMUSCULAR | Status: AC
  Administered 2023-08-18: 10 mg via INTRAVENOUS
  Filled 2023-08-18: qty 1

## 2023-08-18 MED ORDER — SODIUM CHLORIDE 0.9 % IV SOLN
80.0000 mg/m2 | Freq: Once | INTRAVENOUS | Status: AC
  Administered 2023-08-18: 150 mg via INTRAVENOUS
  Filled 2023-08-18: qty 25

## 2023-08-18 MED ORDER — DIPHENHYDRAMINE HCL 50 MG/ML IJ SOLN
50.0000 mg | Freq: Once | INTRAMUSCULAR | Status: AC
  Administered 2023-08-18: 50 mg via INTRAVENOUS
  Filled 2023-08-18: qty 1

## 2023-08-18 MED ORDER — SODIUM CHLORIDE 0.9% FLUSH
10.0000 mL | INTRAVENOUS | Status: DC | PRN
  Administered 2023-08-18: 10 mL

## 2023-08-18 MED ORDER — FAMOTIDINE IN NACL 20-0.9 MG/50ML-% IV SOLN
20.0000 mg | Freq: Once | INTRAVENOUS | Status: AC
  Administered 2023-08-18: 20 mg via INTRAVENOUS
  Filled 2023-08-18: qty 50

## 2023-08-18 MED ORDER — HEPARIN SOD (PORK) LOCK FLUSH 100 UNIT/ML IV SOLN
500.0000 [IU] | Freq: Once | INTRAVENOUS | Status: AC | PRN
  Administered 2023-08-18: 500 [IU]

## 2023-08-18 NOTE — Progress Notes (Signed)
 Brittany [name] CONSULT NOTE  Patient Care Team: [name], NP as PCP - General (Nurse Practitioner) Donnelly Angelica, RN as Oncology Nurse Navigator Pershing Proud, RN as Oncology Nurse Navigator Almond Lint, MD as Consulting Physician (General Surgery) Rachel Moulds, MD as Consulting Physician (Hematology and Oncology) Dorothy Puffer, MD as Consulting Physician (Radiation Oncology)  CHIEF COMPLAINTS/PURPOSE OF CONSULTATION:  Newly diagnosed breast cancer  HISTORY OF PRESENTING ILLNESS:  Brittany [name] 68 y.o. female is here because of recent diagnosis of right breast cancer  I reviewed her records extensively and collaborated the history with the patient.  SUMMARY OF ONCOLOGIC HISTORY: Oncology History  Malignant neoplasm of upper-outer quadrant of right breast in female, estrogen receptor positive (HCC)  02/22/2023 Mammogram   Screening mammogram on February 22, 2023 showed possible mass in the right breast with calcifications warranting further evaluation.  No findings suspicious for malignancy in the left breast.  Diagnostic mammogram done confirmed a persistent irregular mass within the central right breast, adjacent to the mass are slightly heterogeneous calcifications.  Ultrasound showed 1.1 x 1.2 x 1.3 cm irregular hypoechoic mass at 10 o'clock position of the right breast 1 cm from the nipple, no abnormal right axillary lymph nodes are noted.   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  Prognostic showed ER 100% positive strong staining PR 95% positive strong staining Ki-67 of 5% and HER2 2+   03/14/2023 Initial Diagnosis   Malignant neoplasm of upper-outer quadrant of right breast in female, estrogen receptor positive (HCC)    Genetic Testing   [facility] CancerNext-Expanded Panel+RNA was Negative. Report date is  03/27/2023.   The CancerNext-Expanded gene panel offered by [facility] and includes sequencing, rearrangement, and RNA analysis for the following 71 genes: AIP, ALK, APC, ATM, AXIN2, BAP1, BARD1, BMPR1A, BRCA1, BRCA2, BRIP1, CDC73, CDH1, CDK4, CDKN1B, CDKN2A, CHEK2, CTNNA1, DICER1, FH, FLCN, KIF1B, LZTR1, MAX, MEN1, MET, MLH1, MSH2, MSH3, MSH6, MUTYH, NF1, NF2, NTHL1, PALB2, PHOX2B, PMS2, POT1, PRKAR1A, PTCH1, PTEN, RAD51C, RAD51D, RB1, RET, SDHA, SDHAF2, SDHB, SDHC, SDHD, SMAD4, SMARCA4, SMARCB1, SMARCE1, STK11, SUFU, TMEM127, TP53, TSC1, TSC2, and VHL (sequencing and deletion/duplication); EGFR, EGLN1, HOXB13, KIT, MITF, PDGFRA, POLD1, and POLE (sequencing only); EPCAM and GREM1 (deletion/duplication only).    05/31/2023 Cancer Staging   Staging form: Breast, AJCC 8th Edition - Pathologic stage from 05/31/2023: Stage IB (pT1c, pN2a, cM0, G2, ER+, PR+, HER2-) - Signed by Rachel Moulds, MD on 05/31/2023 Stage prefix: Initial diagnosis Method of lymph node assessment: Axillary lymph node dissection Histologic grading system: 3 grade system   06/09/2023 -  Chemotherapy   Patient is on Treatment Plan : BREAST DOSE DENSE AC q14d / PACLitaxel q7d      Discussed the use of AI scribe software for clinical note transcription with the patient, who gave verbal consent to proceed.  History of Present Illness Brittany [name] is a 68 year old female who presents for cycle seven, day one of chemotherapy treatment.  She is currently undergoing chemotherapy with Paclitaxel (Taxol) and is on cycle seven, day one of her treatment. The current cycle is more tolerable compared to [facility], which caused her to feel heat in her body. She feels 'so far, so good' with this cycle and has not noticed any significant side effects such as tingling or numbness, although her thumb feels a little weird  today. No leg swelling is present, and she has been using ice during treatment to mitigate side effects. No mouth sores are  present at this time, although they have occurred in the past, primarily on the left side. Her port is functioning well without issues.  She recently completed a course of antibiotics, which resolved her symptoms of dysuria ('fire pee').  She experiences occasional loose stools but denies persistent diarrhea.  She is monitoring for any tingling or numbness and is using alpha-lipoic acid and B12 supplements to help manage these symptoms.  Rest of the pertinent 10 point ROS reviewed and negative  MEDICAL HISTORY:  Past Medical History:  Diagnosis Date   Anxiety and depression    Elevated LDL cholesterol level    Elevated liver enzymes 07/23/2021   GERD (gastroesophageal reflux disease)    Malignant neoplasm of breast (female) (HCC)    Thyroid disorder 11/14/2017    SURGICAL HISTORY: Past Surgical History:  Procedure Laterality Date   AXILLARY LYMPH NODE DISSECTION Right 05/18/2023   Procedure: RIGHT AXILLARY LYMPH NODE DISSECTION;  Surgeon: Almond Lint, MD;  Location: [facility];  Service: General;  Laterality: Right;  PEC BLOCK   BREAST BIOPSY Right 03/09/2023   [place] RT BREAST BX W LOC DEV 1ST LESION IMG BX SPEC US GUIDE 03/09/2023 [facility] MAMMOGRAPHY   BREAST BIOPSY  04/12/2023   [place] RT RADIOACTIVE SEED LOC 04/12/2023 [facility] ULTRASOUND   BREAST LUMPECTOMY WITH RADIOACTIVE SEED AND SENTINEL LYMPH NODE BIOPSY Right 04/13/2023   Procedure: RIGHT BREAST SEED LOCALIZED LUMPECTOMY WITH SENTINEL NODE BIOPSY;  Surgeon: Almond Lint, MD;  Location: [name];  Service: General;  Laterality: Right;   HERNIA REPAIR     LAPAROSCOPIC HYSTERECTOMY  03/2017   Partial, pt still has both adnexa   PORTACATH PLACEMENT N/A 05/18/2023   Procedure: PORT PLACEMENT WITH ULTRASOUND GUIDANCE;  Surgeon: Almond Lint, MD;  Location: [facility];  Service: General;  Laterality: N/A;    SOCIAL HISTORY: Social History   Socioeconomic History   Marital status: Divorced    Spouse name: Not on file   Number  of children: Not on file   Years of education: Not on file   Highest education level: Master's degree (e.g., MA, MS, MEng, MEd, MSW, MBA)  Occupational History   Not on file  Tobacco Use   Smoking status: Former   Smokeless tobacco: Never   Tobacco comments:    stopped at age 63. smoked for 4-5 years 1 ppd  Vaping Use   Vaping status: Never Used  Substance and Sexual Activity   Alcohol use: Yes    Comment: WINE OCC   Drug use: Never   Sexual activity: Not Currently    Partners: Male    Birth control/protection: Surgical    Comment: hysterectomy  Other Topics Concern   Not on file  Social History Narrative   Not on file   Social Drivers of Health   Financial Resource Strain: Low Risk  (06/20/2023)   Overall Financial Resource Strain (CARDIA)    Difficulty of Paying Living Expenses: Not very hard  Food Insecurity: No Food Insecurity (06/20/2023)   Hunger Vital Sign    Worried About Running Out of Food in the Last Year: Never true    Ran Out of Food in the Last Year: Never true  Transportation Needs: No Transportation Needs (06/20/2023)   PRAPARE - Administrator, Civil Service (Medical): No    Lack of Transportation (Non-Medical): No  Physical  Activity: Insufficiently Active (06/20/2023)   Exercise Vital Sign    Days of Exercise per Week: 3 days    Minutes of Exercise per Session: 30 min  Stress: Stress Concern Present (06/20/2023)   [employer] of Occupational Health - Occupational Stress Questionnaire    Feeling of Stress : To some extent  Social Connections: Unknown (06/20/2023)   Social Connection and Isolation Panel [NHANES]    Frequency of Communication with Friends and Family: More than three times a week    Frequency of Social Gatherings with Friends and Family: Once a week    Attends Religious Services: Patient declined    Database administrator or Organizations: No    Attends Engineer, structural: Not on file    Marital Status: Divorced   Intimate Partner Violence: Not At Risk (03/16/2023)   Humiliation, Afraid, Rape, and Kick questionnaire    Fear of Current or Ex-Partner: No    Emotionally Abused: No    Physically Abused: No    Sexually Abused: No    FAMILY HISTORY: Family History  Problem Relation Age of Onset   Breast cancer Mother 88   Diabetes Mother    Hypertension Mother    Cancer Father 20 - 28       liver?   Diabetes Brother    Cancer Paternal Uncle    Breast cancer Paternal Grandmother 68    ALLERGIES:  is allergic to wellbutrin [bupropion].  MEDICATIONS:  Current Outpatient Medications  Medication Sig Dispense Refill   Biotin 5000 MCG CAPS Take 1 capsule by mouth daily.     ciprofloxacin (CIPRO) 500 MG tablet Take 1 tablet (500 mg total) by mouth 2 (two) times daily. 14 tablet 0   cycloSPORINE (RESTASIS) 0.05 % ophthalmic emulsion Place 1 drop into both eyes 2 (two) times daily.     dexamethasone (DECADRON) 4 MG tablet Take 2 tablets (8 mg total) by mouth daily for 3 days. Start the day after doxorubicin/cyclophosphamide chemotherapy. Take with food. (Patient not taking: Reported on 05/26/2023) 30 tablet 1   EVENING PRIMROSE OIL PO Take 1 tablet by mouth daily.     Flaxseed, Linseed, (FLAX SEED OIL PO) Take 1 capsule by mouth daily.     fluconazole (DIFLUCAN) 200 MG tablet Take 1 tablet (200 mg total) by mouth daily. 2 tablet 0   glucosamine-chondroitin 500-400 MG tablet Take 1 tablet by mouth daily.     lidocaine-prilocaine (EMLA) cream Apply to affected area once (Patient not taking: Reported on 05/26/2023) 30 g 3   MAGNESIUM GLYCINATE PO Take 350 mg by mouth daily.     Methylsulfonylmethane (MSM) 1000 MG TABS Take 1 tablet by mouth 3 (three) times a week.     milk thistle 175 MG tablet Take 175 mg by mouth daily.     ondansetron (ZOFRAN) 8 MG tablet Take 1 tab (8 mg) by mouth every 8 hrs as needed for nausea/vomiting. Start third day after doxorubicin/cyclophosphamide chemotherapy. (Patient not  taking: Reported on 05/26/2023) 30 tablet 1   OVER THE COUNTER MEDICATION Holy basil -   5 times a week     PARoxetine (PAXIL) 10 MG tablet Take 1 tablet (10 mg total) by mouth daily. 90 tablet 1   prochlorperazine (COMPAZINE) 10 MG tablet Take 1 tablet (10 mg total) by mouth every 6 (six) hours as needed for nausea or vomiting. (Patient not taking: Reported on 05/26/2023) 30 tablet 1   pyridoxine (B-6) 100 MG tablet Take 100 mg by  mouth daily. (Patient not taking: Reported on 05/26/2023)     vitamin B-12 (CYANOCOBALAMIN) 500 MCG tablet Take 500 mcg by mouth daily. (Patient not taking: Reported on 05/26/2023)     vitamin C (ASCORBIC ACID) 250 MG tablet Take 250 mg by mouth daily. (Patient not taking: Reported on 05/26/2023)     VITAMIN D PO Take 1 tablet by mouth daily.     No current facility-administered medications for this visit.   Facility-Administered Medications Ordered in Other Visits  Medication Dose Route Frequency Provider Last Rate Last Admin   0.9 %  sodium chloride infusion   Intravenous Continuous Huntley Knoop, Burnice Logan, MD   Stopped at 08/18/23 1337   sodium chloride flush (NS) 0.9 % injection 10 mL  10 mL Intracatheter PRN Rachel Moulds, MD   10 mL at 08/18/23 1334    REVIEW OF SYSTEMS:   Constitutional: Denies fevers, chills or abnormal night sweats Eyes: Denies blurriness of vision, double vision or watery eyes Ears, nose, mouth, throat, and face: Denies mucositis or sore throat Respiratory: Denies cough, dyspnea or wheezes Cardiovascular: Denies palpitation, chest discomfort or lower extremity swelling Gastrointestinal:  Denies nausea, heartburn or change in bowel habits Skin: Denies abnormal skin rashes Lymphatics: Denies new lymphadenopathy or easy bruising Neurological:Denies numbness, tingling or new weaknesses Behavioral/Psych: Mood is stable, no new changes  Breast: Denies any palpable lumps or discharge All other systems were reviewed with the patient and are  negative.  PHYSICAL EXAMINATION: ECOG PERFORMANCE STATUS: 0 - Asymptomatic  Vitals:   08/18/23 1031  BP: 111/68  Pulse: 88  Resp: 16  Temp: (!) 97.3 F (36.3 C)  SpO2: 96%     Filed Weights   08/18/23 1031  Weight: 170 lb 9.6 oz (77.4 kg)    GENERAL:alert, no distress and comfortable Port site appears well. Chest: CTA bilaterally Heart: RRR No LE swelling  LABORATORY DATA:  I have reviewed the data as listed Lab Results  Component Value Date   WBC 4.8 08/18/2023   HGB 9.8 (L) 08/18/2023   HCT 28.5 (L) 08/18/2023   MCV 97.3 08/18/2023   PLT 335 08/18/2023   Lab Results  Component Value Date   NA 140 08/18/2023   K 4.1 08/18/2023   CL 109 08/18/2023   CO2 27 08/18/2023    RADIOGRAPHIC STUDIES: I have personally reviewed the radiological reports and agreed with the findings in the report.  ASSESSMENT AND PLAN:  Malignant neoplasm of upper-outer quadrant of right breast in female, estrogen receptor positive (HCC)  Assessment & Plan Breast Cancer On cycle seven, day one of Paclitaxel chemotherapy. - Manageable side effects, no significant neuropathy. Blood counts stable, low red count not requiring intervention. No significant adverse effects. - Continue Paclitaxel chemotherapy as scheduled. - Monitor blood counts regularly. - Advise to report new or worsening symptoms, particularly tingling or numbness. - Encourage use of ice packs for potential neuropathy, - Schedule follow-up in two weeks.       All questions were answered. The patient knows to call the clinic with any problems, questions or concerns.    Rachel Moulds, MD 08/18/23

## 2023-08-18 NOTE — Patient Instructions (Signed)

## 2023-08-18 NOTE — Assessment & Plan Note (Signed)
  Assessment & Plan Breast Cancer On cycle seven, day one of Paclitaxel chemotherapy. - Manageable side effects, no significant neuropathy. Blood counts stable, low red count not requiring intervention. No significant adverse effects. - Continue Paclitaxel chemotherapy as scheduled. - Monitor blood counts regularly. - Advise to report new or worsening symptoms, particularly tingling or numbness. - Encourage use of ice packs for potential neuropathy, - Schedule follow-up in two weeks.

## 2023-08-25 ENCOUNTER — Inpatient Hospital Stay: Payer: Medicare Other

## 2023-08-25 ENCOUNTER — Ambulatory Visit: Payer: Medicare Other | Admitting: Hematology and Oncology

## 2023-08-25 VITALS — BP 112/68 | HR 88 | Temp 98.2°F | Resp 18 | Wt 170.0 lb

## 2023-08-25 DIAGNOSIS — C50411 Malignant neoplasm of upper-outer quadrant of right female breast: Secondary | ICD-10-CM

## 2023-08-25 DIAGNOSIS — Z5111 Encounter for antineoplastic chemotherapy: Secondary | ICD-10-CM | POA: Diagnosis not present

## 2023-08-25 DIAGNOSIS — Z95828 Presence of other vascular implants and grafts: Secondary | ICD-10-CM

## 2023-08-25 LAB — CBC WITH DIFFERENTIAL (CANCER CENTER ONLY)
Abs Immature Granulocytes: 0.03 10*3/uL (ref 0.00–0.07)
Basophils Absolute: 0 10*3/uL (ref 0.0–0.1)
Basophils Relative: 1 %
Eosinophils Absolute: 0.1 10*3/uL (ref 0.0–0.5)
Eosinophils Relative: 3 %
HCT: 29.2 % — ABNORMAL LOW (ref 36.0–46.0)
Hemoglobin: 9.9 g/dL — ABNORMAL LOW (ref 12.0–15.0)
Immature Granulocytes: 1 %
Lymphocytes Relative: 30 %
Lymphs Abs: 1.4 10*3/uL (ref 0.7–4.0)
MCH: 33.6 pg (ref 26.0–34.0)
MCHC: 33.9 g/dL (ref 30.0–36.0)
MCV: 99 fL (ref 80.0–100.0)
Monocytes Absolute: 0.5 10*3/uL (ref 0.1–1.0)
Monocytes Relative: 11 %
Neutro Abs: 2.4 10*3/uL (ref 1.7–7.7)
Neutrophils Relative %: 54 %
Platelet Count: 341 10*3/uL (ref 150–400)
RBC: 2.95 MIL/uL — ABNORMAL LOW (ref 3.87–5.11)
RDW: 16.9 % — ABNORMAL HIGH (ref 11.5–15.5)
WBC Count: 4.5 10*3/uL (ref 4.0–10.5)
nRBC: 0 % (ref 0.0–0.2)

## 2023-08-25 LAB — CMP (CANCER CENTER ONLY)
ALT: 47 U/L — ABNORMAL HIGH (ref 0–44)
AST: 26 U/L (ref 15–41)
Albumin: 4.1 g/dL (ref 3.5–5.0)
Alkaline Phosphatase: 69 U/L (ref 38–126)
Anion gap: 4 — ABNORMAL LOW (ref 5–15)
BUN: 12 mg/dL (ref 8–23)
CO2: 26 mmol/L (ref 22–32)
Calcium: 9 mg/dL (ref 8.9–10.3)
Chloride: 110 mmol/L (ref 98–111)
Creatinine: 0.71 mg/dL (ref 0.44–1.00)
GFR, Estimated: >60 mL/min (ref >60)
Glucose, Bld: 88 mg/dL (ref 70–99)
Potassium: 4 mmol/L (ref 3.5–5.1)
Sodium: 140 mmol/L (ref 135–145)
Total Bilirubin: 0.4 mg/dL (ref 0.0–1.2)
Total Protein: 6.1 g/dL — ABNORMAL LOW (ref 6.5–8.1)

## 2023-08-25 MED ORDER — HEPARIN SOD (PORK) LOCK FLUSH 100 UNIT/ML IV SOLN
500.0000 [IU] | Freq: Once | INTRAVENOUS | Status: AC | PRN
  Administered 2023-08-25: 500 [IU]

## 2023-08-25 MED ORDER — SODIUM CHLORIDE 0.9 % IV SOLN: INTRAVENOUS | Status: DC

## 2023-08-25 MED ORDER — SODIUM CHLORIDE 0.9% FLUSH
10.0000 mL | INTRAVENOUS | Status: DC | PRN
  Administered 2023-08-25: 10 mL

## 2023-08-25 MED ORDER — DIPHENHYDRAMINE HCL 50 MG/ML IJ SOLN
50.0000 mg | Freq: Once | INTRAMUSCULAR | Status: AC
Start: 2023-08-25 — End: 2023-08-25
  Administered 2023-08-25: 50 mg via INTRAVENOUS
  Filled 2023-08-25: qty 1

## 2023-08-25 MED ORDER — DEXAMETHASONE SODIUM PHOSPHATE 10 MG/ML IJ SOLN
10.0000 mg | Freq: Once | INTRAMUSCULAR | Status: AC
  Administered 2023-08-25: 10 mg via INTRAVENOUS
  Filled 2023-08-25: qty 1

## 2023-08-25 MED ORDER — SODIUM CHLORIDE 0.9% FLUSH
10.0000 mL | Freq: Once | INTRAVENOUS | Status: AC
Start: 2023-08-25 — End: 2023-08-25
  Administered 2023-08-25: 10 mL

## 2023-08-25 MED ORDER — SODIUM CHLORIDE 0.9 % IV SOLN
80.0000 mg/m2 | Freq: Once | INTRAVENOUS | Status: AC
Start: 2023-08-25 — End: 2023-08-25
  Administered 2023-08-25: 150 mg via INTRAVENOUS
  Filled 2023-08-25: qty 25

## 2023-08-25 MED ORDER — FAMOTIDINE IN NACL 20-0.9 MG/50ML-% IV SOLN
20.0000 mg | Freq: Once | INTRAVENOUS | Status: AC
  Administered 2023-08-25: 20 mg via INTRAVENOUS
  Filled 2023-08-25: qty 50

## 2023-08-25 NOTE — Patient Instructions (Signed)

## 2023-08-31 NOTE — Assessment & Plan Note (Signed)
  Assessment & Plan Breast Cancer On cycle nine day one of Paclitaxel  chemotherapy. - Manageable side effects, no significant neuropathy. Blood counts stable, low red count not requiring intervention. No significant adverse effects. - Continue Paclitaxel  chemotherapy as scheduled. - Monitor blood counts regularly. - Advise to report new or worsening symptoms, particularly tingling or numbness. - Encourage use of ice packs for potential neuropathy, - Schedule follow-up in two weeks.

## 2023-08-31 NOTE — Progress Notes (Signed)
 [facility] CONSULT NOTE  Patient Care Team: Zorita Hiss, NP as PCP - General (Nurse Practitioner) Alane Hsu, RN as Oncology Nurse Navigator Auther Bo, RN as Oncology Nurse Navigator Lockie Rima, MD as Consulting Physician (General Surgery) Murleen Arms, MD as Consulting Physician (Hematology and Oncology) Johna Myers, MD as Consulting Physician (Radiation Oncology)  CHIEF COMPLAINTS/PURPOSE OF CONSULTATION:  Newly diagnosed breast cancer  HISTORY OF PRESENTING ILLNESS:  Brittany [name] 68 y.o. female is here because of recent diagnosis of right breast cancer  I reviewed her records extensively and collaborated the history with the patient.  SUMMARY OF ONCOLOGIC HISTORY: Oncology History  Malignant neoplasm of upper-outer quadrant of right breast in female, estrogen receptor positive (HCC)  02/22/2023 Mammogram   Screening mammogram on February 22, 2023 showed possible mass in the right breast with calcifications warranting further evaluation.  No findings suspicious for malignancy in the left breast.  Diagnostic mammogram done confirmed a persistent irregular mass within the central right breast, adjacent to the mass are slightly heterogeneous calcifications.  Ultrasound showed 1.1 x 1.2 x 1.3 cm irregular hypoechoic mass at 10 o'clock position of the right breast 1 cm from the nipple, no abnormal right axillary lymph nodes are noted.   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  Prognostic showed ER 100% positive strong staining PR 95% positive strong staining Ki-67 of 5% and HER2 2+   03/14/2023 Initial Diagnosis   Malignant neoplasm of upper-outer quadrant of right breast in female, estrogen receptor positive (HCC)    Genetic Testing   [facility] CancerNext-Expanded Panel+RNA was Negative. Report date is  03/27/2023.   The CancerNext-Expanded gene panel offered by [facility] and includes sequencing, rearrangement, and RNA analysis for the following 71 genes: AIP, ALK, APC, ATM, AXIN2, BAP1, BARD1, BMPR1A, BRCA1, BRCA2, BRIP1, CDC73, CDH1, CDK4, CDKN1B, CDKN2A, CHEK2, CTNNA1, DICER1, FH, FLCN, KIF1B, LZTR1, MAX, MEN1, MET, MLH1, MSH2, MSH3, MSH6, MUTYH, NF1, NF2, NTHL1, PALB2, PHOX2B, PMS2, POT1, PRKAR1A, PTCH1, PTEN, RAD51C, RAD51D, RB1, RET, SDHA, SDHAF2, SDHB, SDHC, SDHD, SMAD4, SMARCA4, SMARCB1, SMARCE1, STK11, SUFU, TMEM127, TP53, TSC1, TSC2, and VHL (sequencing and deletion/duplication); EGFR, EGLN1, HOXB13, KIT, MITF, PDGFRA, POLD1, and POLE (sequencing only); EPCAM and GREM1 (deletion/duplication only).    05/31/2023 Cancer Staging   Staging form: Breast, AJCC 8th Edition - Pathologic stage from 05/31/2023: Stage IB (pT1c, pN2a, cM0, G2, ER+, PR+, HER2-) - Signed by Murleen Arms, MD on 05/31/2023 Stage prefix: Initial diagnosis Method of lymph node assessment: Axillary lymph node dissection Histologic grading system: 3 grade system   06/09/2023 -  Chemotherapy   Patient is on Treatment Plan : BREAST DOSE DENSE AC q14d / PACLitaxel  q7d      Discussed the use of AI scribe software for clinical note transcription with the patient, who gave verbal consent to proceed.  History of Present Illness Brittany [name] is a 68 year old female who presents before planned cycle of weekly taxol  chemotherapy.    Brittany [name] is a 68 year old female who presents with persistent urinary symptoms.  She has been experiencing persistent urinary symptoms despite taking cranberry supplements with manitose D and uva ursi, which have helped reduce the stinging sensation. However, she continues to have difficulty emptying her bladder completely. No fevers or chills are present.  She experiences daily diarrhea with runny stools,  though it is not severe enough to require Imodium. No nausea or vomiting  is reported.  She experienced tingling and numbness in her finger and baby toe last night, which improved after applying Tiger Balm. She is currently using ice during her treatments.  She is taking iron supplements three times a week after noticing a decrease in her red blood cell count, hoping it might provide some energy.  Rest of the pertinent 10 point ROS reviewed and negative  MEDICAL HISTORY:  Past Medical History:  Diagnosis Date   Anxiety and depression    Elevated LDL cholesterol level    Elevated liver enzymes 07/23/2021   GERD (gastroesophageal reflux disease)    Malignant neoplasm of breast (female) (HCC)    Thyroid  disorder 11/14/2017    SURGICAL HISTORY: Past Surgical History:  Procedure Laterality Date   AXILLARY LYMPH NODE DISSECTION Right 05/18/2023   Procedure: RIGHT AXILLARY LYMPH NODE DISSECTION;  Surgeon: Lockie Rima, MD;  Location: [facility] OR;  Service: General;  Laterality: Right;  PEC BLOCK   BREAST BIOPSY Right 03/09/2023   US  RT BREAST BX W LOC DEV 1ST LESION IMG BX SPEC US  GUIDE 03/09/2023 [facility] MAMMOGRAPHY   BREAST BIOPSY  04/12/2023   US  RT RADIOACTIVE SEED LOC 04/12/2023 [facility] ULTRASOUND   BREAST LUMPECTOMY WITH RADIOACTIVE SEED AND SENTINEL LYMPH NODE BIOPSY Right 04/13/2023   Procedure: RIGHT BREAST SEED LOCALIZED LUMPECTOMY WITH SENTINEL NODE BIOPSY;  Surgeon: Lockie Rima, MD;  Location: [facility];  Service: General;  Laterality: Right;   HERNIA REPAIR     LAPAROSCOPIC HYSTERECTOMY  03/2017   Partial, pt still has both adnexa   PORTACATH PLACEMENT N/A 05/18/2023   Procedure: PORT PLACEMENT WITH ULTRASOUND GUIDANCE;  Surgeon: Lockie Rima, MD;  Location: [facility] OR;  Service: General;  Laterality: N/A;    SOCIAL HISTORY: Social History   Socioeconomic History   Marital status: Divorced    Spouse name: Not on file   Number of children: Not on file   Years of education: Not on file   Highest education level: Master's degree (e.g., MA,  MS, MEng, MEd, MSW, MBA)  Occupational History   Not on file  Tobacco Use   Smoking status: Former   Smokeless tobacco: Never   Tobacco comments:    stopped at age 68. smoked for 4-5 years 1 ppd  Vaping Use   Vaping status: Never Used  Substance and Sexual Activity   Alcohol use: Yes    Comment: WINE OCC   Drug use: Never   Sexual activity: Not Currently    Partners: Male    Birth control/protection: Surgical    Comment: hysterectomy  Other Topics Concern   Not on file  Social History Narrative   Not on file   Social Drivers of Health   Financial Resource Strain: Low Risk  (06/20/2023)   Overall Financial Resource Strain (CARDIA)    Difficulty of Paying Living Expenses: Not very hard  Food Insecurity: No Food Insecurity (06/20/2023)   Hunger Vital Sign    Worried About Running Out of Food in the Last Year: Never true    Ran Out of Food in the Last Year: Never true  Transportation Needs: No Transportation Needs (06/20/2023)   PRAPARE - Administrator, Civil Service (Medical): No    Lack of Transportation (Non-Medical): No  Physical Activity: Insufficiently Active (06/20/2023)   Exercise Vital Sign    Days of Exercise per Week: 3 days    Minutes of  Exercise per Session: 30 min  Stress: Stress Concern Present (06/20/2023)   [employer] of Occupational Health - Occupational Stress Questionnaire    Feeling of Stress : To some extent  Social Connections: Unknown (06/20/2023)   Social Connection and Isolation Panel [NHANES]    Frequency of Communication with Friends and Family: More than three times a week    Frequency of Social Gatherings with Friends and Family: Once a week    Attends Religious Services: Patient declined    Database administrator or Organizations: No    Attends Engineer, structural: Not on file    Marital Status: Divorced  Intimate Partner Violence: Not At Risk (03/16/2023)   Humiliation, Afraid, Rape, and Kick questionnaire    Fear  of Current or Ex-Partner: No    Emotionally Abused: No    Physically Abused: No    Sexually Abused: No    FAMILY HISTORY: Family History  Problem Relation Age of Onset   Breast cancer Mother 80   Diabetes Mother    Hypertension Mother    Cancer Father 74 - 69       liver?   Diabetes Brother    Cancer Paternal Uncle    Breast cancer Paternal Grandmother 72    ALLERGIES:  is allergic to wellbutrin [bupropion].  MEDICATIONS:  Current Outpatient Medications  Medication Sig Dispense Refill   Biotin 5000 MCG CAPS Take 1 capsule by mouth daily.     ciprofloxacin  (CIPRO ) 500 MG tablet Take 1 tablet (500 mg total) by mouth 2 (two) times daily. 14 tablet 0   cycloSPORINE (RESTASIS) 0.05 % ophthalmic emulsion Place 1 drop into both eyes 2 (two) times daily.     dexamethasone  (DECADRON ) 4 MG tablet Take 2 tablets (8 mg total) by mouth daily for 3 days. Start the day after doxorubicin /cyclophosphamide  chemotherapy. Take with food. (Patient not taking: Reported on 05/26/2023) 30 tablet 1   EVENING PRIMROSE OIL PO Take 1 tablet by mouth daily.     Flaxseed, Linseed, (FLAX SEED OIL PO) Take 1 capsule by mouth daily.     fluconazole  (DIFLUCAN ) 200 MG tablet Take 1 tablet (200 mg total) by mouth daily. 2 tablet 0   glucosamine-chondroitin 500-400 MG tablet Take 1 tablet by mouth daily.     lidocaine -prilocaine  (EMLA ) cream Apply to affected area once (Patient not taking: Reported on 05/26/2023) 30 g 3   MAGNESIUM GLYCINATE PO Take 350 mg by mouth daily.     Methylsulfonylmethane (MSM) 1000 MG TABS Take 1 tablet by mouth 3 (three) times a week.     milk thistle 175 MG tablet Take 175 mg by mouth daily.     ondansetron  (ZOFRAN ) 8 MG tablet Take 1 tab (8 mg) by mouth every 8 hrs as needed for nausea/vomiting. Start third day after doxorubicin /cyclophosphamide  chemotherapy. (Patient not taking: Reported on 05/26/2023) 30 tablet 1   OVER THE COUNTER MEDICATION Holy basil -   5 times a week     PARoxetine   (PAXIL ) 10 MG tablet Take 1 tablet (10 mg total) by mouth daily. 90 tablet 1   prochlorperazine  (COMPAZINE ) 10 MG tablet Take 1 tablet (10 mg total) by mouth every 6 (six) hours as needed for nausea or vomiting. (Patient not taking: Reported on 05/26/2023) 30 tablet 1   pyridoxine (B-6) 100 MG tablet Take 100 mg by mouth daily. (Patient not taking: Reported on 05/26/2023)     vitamin B-12 (CYANOCOBALAMIN) 500 MCG tablet Take 500 mcg by mouth daily. (  Patient not taking: Reported on 05/26/2023)     vitamin C (ASCORBIC ACID) 250 MG tablet Take 250 mg by mouth daily. (Patient not taking: Reported on 05/26/2023)     VITAMIN D  PO Take 1 tablet by mouth daily.     No current facility-administered medications for this visit.   Facility-Administered Medications Ordered in Other Visits  Medication Dose Route Frequency Provider Last Rate Last Admin   0.9 %  sodium chloride  infusion   Intravenous Continuous Emmy Keng, MD 10 mL/hr at 09/01/23 1304 New Bag at 09/01/23 1304   famotidine  (PEPCID ) IVPB 20 mg premix  20 mg Intravenous Once Jalisia Puchalski, MD 200 mL/hr at 09/01/23 1311 20 mg at 09/01/23 1311   heparin  lock flush 100 unit/mL  500 Units Intracatheter Once PRN Cheyne Bungert, MD       PACLitaxel  (TAXOL ) 150 mg in sodium chloride  0.9 % 250 mL chemo infusion (</= 80mg /m2)  80 mg/m2 (Treatment Plan Recorded) Intravenous Once Giada Schoppe, MD       sodium chloride  flush (NS) 0.9 % injection 10 mL  10 mL Intracatheter PRN Tkai Serfass, MD        REVIEW OF SYSTEMS:   Constitutional: Denies fevers, chills or abnormal night sweats Eyes: Denies blurriness of vision, double vision or watery eyes Ears, nose, mouth, throat, and face: Denies mucositis or sore throat Respiratory: Denies cough, dyspnea or wheezes Cardiovascular: Denies palpitation, chest discomfort or lower extremity swelling Gastrointestinal:  Denies nausea, heartburn or change in bowel habits Skin: Denies abnormal skin  rashes Lymphatics: Denies new lymphadenopathy or easy bruising Neurological:Denies numbness, tingling or new weaknesses Behavioral/Psych: Mood is stable, no new changes  Breast: Denies any palpable lumps or discharge All other systems were reviewed with the patient and are negative.  PHYSICAL EXAMINATION: ECOG PERFORMANCE STATUS: 0 - Asymptomatic  Vitals:   09/01/23 1156  BP: 115/70  Pulse: 91  Resp: 17  Temp: 97.8 F (36.6 C)  SpO2: 98%      Filed Weights   09/01/23 1156  Weight: 171 lb (77.6 kg)     GENERAL:alert, no distress and comfortable Port site appears well. Chest: CTA bilaterally Heart: RRR No LE swelling  LABORATORY DATA:  I have reviewed the data as listed Lab Results  Component Value Date   WBC 4.8 09/01/2023   HGB 10.4 (L) 09/01/2023   HCT 29.7 (L) 09/01/2023   MCV 98.3 09/01/2023   PLT 309 09/01/2023   Lab Results  Component Value Date   NA 139 09/01/2023   K 3.9 09/01/2023   CL 108 09/01/2023   CO2 28 09/01/2023    RADIOGRAPHIC STUDIES: I have personally reviewed the radiological reports and agreed with the findings in the report.  ASSESSMENT AND PLAN:  Malignant neoplasm of upper-outer quadrant of right breast in female, estrogen receptor positive (HCC)  Assessment & Plan Breast Cancer On cycle nine day one of Paclitaxel  chemotherapy. - Manageable side effects, no significant neuropathy. Blood counts stable, low red count not requiring intervention. No significant adverse effects. - Continue Paclitaxel  chemotherapy as scheduled. - Monitor blood counts regularly. - Advise to report new or worsening symptoms, particularly tingling or numbness. - Encourage use of ice packs for potential neuropathy, - Schedule follow-up in two weeks.       All questions were answered. The patient knows to call the clinic with any problems, questions or concerns.    Murleen Arms, MD 09/01/23

## 2023-09-01 ENCOUNTER — Other Ambulatory Visit: Payer: Self-pay | Admitting: *Deleted

## 2023-09-01 ENCOUNTER — Inpatient Hospital Stay (HOSPITAL_BASED_OUTPATIENT_CLINIC_OR_DEPARTMENT_OTHER): Payer: Medicare Other | Admitting: Hematology and Oncology

## 2023-09-01 ENCOUNTER — Inpatient Hospital Stay: Payer: Medicare Other

## 2023-09-01 VITALS — BP 115/70 | HR 91 | Temp 97.8°F | Resp 17 | Wt 171.0 lb

## 2023-09-01 DIAGNOSIS — Z95828 Presence of other vascular implants and grafts: Secondary | ICD-10-CM

## 2023-09-01 DIAGNOSIS — Z170 Estrogen receptor positive status [ER+]: Secondary | ICD-10-CM | POA: Diagnosis not present

## 2023-09-01 DIAGNOSIS — C50411 Malignant neoplasm of upper-outer quadrant of right female breast: Secondary | ICD-10-CM

## 2023-09-01 DIAGNOSIS — N3000 Acute cystitis without hematuria: Secondary | ICD-10-CM | POA: Diagnosis not present

## 2023-09-01 DIAGNOSIS — Z5111 Encounter for antineoplastic chemotherapy: Secondary | ICD-10-CM | POA: Diagnosis not present

## 2023-09-01 LAB — CBC WITH DIFFERENTIAL (CANCER CENTER ONLY)
Abs Immature Granulocytes: 0.02 10*3/uL (ref 0.00–0.07)
Basophils Absolute: 0 10*3/uL (ref 0.0–0.1)
Basophils Relative: 1 %
Eosinophils Absolute: 0.1 10*3/uL (ref 0.0–0.5)
Eosinophils Relative: 3 %
HCT: 29.7 % — ABNORMAL LOW (ref 36.0–46.0)
Hemoglobin: 10.4 g/dL — ABNORMAL LOW (ref 12.0–15.0)
Immature Granulocytes: 0 %
Lymphocytes Relative: 26 %
Lymphs Abs: 1.3 10*3/uL (ref 0.7–4.0)
MCH: 34.4 pg — ABNORMAL HIGH (ref 26.0–34.0)
MCHC: 35 g/dL (ref 30.0–36.0)
MCV: 98.3 fL (ref 80.0–100.0)
Monocytes Absolute: 0.5 10*3/uL (ref 0.1–1.0)
Monocytes Relative: 11 %
Neutro Abs: 2.9 10*3/uL (ref 1.7–7.7)
Neutrophils Relative %: 59 %
Platelet Count: 309 10*3/uL (ref 150–400)
RBC: 3.02 MIL/uL — ABNORMAL LOW (ref 3.87–5.11)
RDW: 15.8 % — ABNORMAL HIGH (ref 11.5–15.5)
WBC Count: 4.8 10*3/uL (ref 4.0–10.5)
nRBC: 0 % (ref 0.0–0.2)

## 2023-09-01 LAB — URINALYSIS, COMPLETE (UACMP) WITH MICROSCOPIC
Bacteria, UA: NONE SEEN
Bilirubin Urine: NEGATIVE
Glucose, UA: NEGATIVE mg/dL
Hgb urine dipstick: NEGATIVE
Ketones, ur: NEGATIVE mg/dL
Nitrite: NEGATIVE
Protein, ur: NEGATIVE mg/dL
Specific Gravity, Urine: 1.003 — ABNORMAL LOW (ref 1.005–1.030)
pH: 5 (ref 5.0–8.0)

## 2023-09-01 LAB — CMP (CANCER CENTER ONLY)
ALT: 29 U/L (ref 0–44)
AST: 18 U/L (ref 15–41)
Albumin: 4.1 g/dL (ref 3.5–5.0)
Alkaline Phosphatase: 79 U/L (ref 38–126)
Anion gap: 3 — ABNORMAL LOW (ref 5–15)
BUN: 10 mg/dL (ref 8–23)
CO2: 28 mmol/L (ref 22–32)
Calcium: 9.1 mg/dL (ref 8.9–10.3)
Chloride: 108 mmol/L (ref 98–111)
Creatinine: 0.77 mg/dL (ref 0.44–1.00)
GFR, Estimated: >60 mL/min (ref >60)
Glucose, Bld: 86 mg/dL (ref 70–99)
Potassium: 3.9 mmol/L (ref 3.5–5.1)
Sodium: 139 mmol/L (ref 135–145)
Total Bilirubin: 0.4 mg/dL (ref 0.0–1.2)
Total Protein: 6.3 g/dL — ABNORMAL LOW (ref 6.5–8.1)

## 2023-09-01 MED ORDER — SODIUM CHLORIDE 0.9 % IV SOLN: INTRAVENOUS | Status: DC

## 2023-09-01 MED ORDER — SODIUM CHLORIDE 0.9% FLUSH
10.0000 mL | Freq: Once | INTRAVENOUS | Status: AC
  Administered 2023-09-01: 10 mL

## 2023-09-01 MED ORDER — SODIUM CHLORIDE 0.9% FLUSH: 10.0000 mL | INTRAVENOUS | Status: DC | PRN

## 2023-09-01 MED ORDER — HEPARIN SOD (PORK) LOCK FLUSH 100 UNIT/ML IV SOLN: 500.0000 [IU] | Freq: Once | INTRAVENOUS | Status: DC | PRN

## 2023-09-01 MED ORDER — DIPHENHYDRAMINE HCL 50 MG/ML IJ SOLN
50.0000 mg | Freq: Once | INTRAMUSCULAR | Status: AC
  Administered 2023-09-01: 50 mg via INTRAVENOUS
  Filled 2023-09-01: qty 1

## 2023-09-01 MED ORDER — SODIUM CHLORIDE 0.9 % IV SOLN
80.0000 mg/m2 | Freq: Once | INTRAVENOUS | Status: AC
  Administered 2023-09-01: 150 mg via INTRAVENOUS
  Filled 2023-09-01: qty 25

## 2023-09-01 MED ORDER — FAMOTIDINE IN NACL 20-0.9 MG/50ML-% IV SOLN
20.0000 mg | Freq: Once | INTRAVENOUS | Status: AC
  Administered 2023-09-01: 20 mg via INTRAVENOUS
  Filled 2023-09-01: qty 50

## 2023-09-01 MED ORDER — DEXAMETHASONE SODIUM PHOSPHATE 10 MG/ML IJ SOLN
10.0000 mg | Freq: Once | INTRAMUSCULAR | Status: AC
  Administered 2023-09-01: 10 mg via INTRAVENOUS
  Filled 2023-09-01: qty 1

## 2023-09-01 NOTE — Addendum Note (Signed)
 Addended by: Mariselda Badalamenti, NAGA VENKATA KALIPRAVEENA on: 09/01/2023 03:37 PM   Modules accepted: Orders

## 2023-09-01 NOTE — Patient Instructions (Signed)

## 2023-09-02 LAB — URINE CULTURE: Culture: <10000 — AB

## 2023-09-08 ENCOUNTER — Inpatient Hospital Stay: Payer: Medicare Other

## 2023-09-08 ENCOUNTER — Inpatient Hospital Stay: Payer: Medicare Other | Attending: Hematology and Oncology

## 2023-09-08 ENCOUNTER — Encounter: Payer: Self-pay | Admitting: *Deleted

## 2023-09-08 VITALS — BP 121/85 | HR 82 | Temp 97.7°F | Resp 16 | Wt 170.8 lb

## 2023-09-08 DIAGNOSIS — C50411 Malignant neoplasm of upper-outer quadrant of right female breast: Secondary | ICD-10-CM | POA: Insufficient documentation

## 2023-09-08 DIAGNOSIS — Z79632 Long term (current) use of antitumor antibiotic: Secondary | ICD-10-CM | POA: Insufficient documentation

## 2023-09-08 DIAGNOSIS — Z5189 Encounter for other specified aftercare: Secondary | ICD-10-CM | POA: Diagnosis not present

## 2023-09-08 DIAGNOSIS — T451X5A Adverse effect of antineoplastic and immunosuppressive drugs, initial encounter: Secondary | ICD-10-CM | POA: Insufficient documentation

## 2023-09-08 DIAGNOSIS — Z5111 Encounter for antineoplastic chemotherapy: Secondary | ICD-10-CM | POA: Insufficient documentation

## 2023-09-08 DIAGNOSIS — Z95828 Presence of other vascular implants and grafts: Secondary | ICD-10-CM

## 2023-09-08 DIAGNOSIS — F17210 Nicotine dependence, cigarettes, uncomplicated: Secondary | ICD-10-CM | POA: Insufficient documentation

## 2023-09-08 DIAGNOSIS — Z1721 Progesterone receptor positive status: Secondary | ICD-10-CM | POA: Insufficient documentation

## 2023-09-08 DIAGNOSIS — Z79630 Long term (current) use of alkylating agent: Secondary | ICD-10-CM | POA: Insufficient documentation

## 2023-09-08 DIAGNOSIS — Z79899 Other long term (current) drug therapy: Secondary | ICD-10-CM | POA: Diagnosis not present

## 2023-09-08 DIAGNOSIS — D701 Agranulocytosis secondary to cancer chemotherapy: Secondary | ICD-10-CM | POA: Diagnosis not present

## 2023-09-08 DIAGNOSIS — Z1732 Human epidermal growth factor receptor 2 negative status: Secondary | ICD-10-CM | POA: Diagnosis not present

## 2023-09-08 DIAGNOSIS — Z170 Estrogen receptor positive status [ER+]: Secondary | ICD-10-CM | POA: Diagnosis not present

## 2023-09-08 LAB — CBC WITH DIFFERENTIAL (CANCER CENTER ONLY)
Abs Immature Granulocytes: 0.03 10*3/uL (ref 0.00–0.07)
Basophils Absolute: 0 10*3/uL (ref 0.0–0.1)
Basophils Relative: 1 %
Eosinophils Absolute: 0.1 10*3/uL (ref 0.0–0.5)
Eosinophils Relative: 3 %
HCT: 30.6 % — ABNORMAL LOW (ref 36.0–46.0)
Hemoglobin: 10.4 g/dL — ABNORMAL LOW (ref 12.0–15.0)
Immature Granulocytes: 1 %
Lymphocytes Relative: 28 %
Lymphs Abs: 1.4 10*3/uL (ref 0.7–4.0)
MCH: 33.9 pg (ref 26.0–34.0)
MCHC: 34 g/dL (ref 30.0–36.0)
MCV: 99.7 fL (ref 80.0–100.0)
Monocytes Absolute: 0.5 10*3/uL (ref 0.1–1.0)
Monocytes Relative: 10 %
Neutro Abs: 2.9 10*3/uL (ref 1.7–7.7)
Neutrophils Relative %: 57 %
Platelet Count: 316 10*3/uL (ref 150–400)
RBC: 3.07 MIL/uL — ABNORMAL LOW (ref 3.87–5.11)
RDW: 15.2 % (ref 11.5–15.5)
WBC Count: 5 10*3/uL (ref 4.0–10.5)
nRBC: 0 % (ref 0.0–0.2)

## 2023-09-08 LAB — CMP (CANCER CENTER ONLY)
ALT: 49 U/L — ABNORMAL HIGH (ref 0–44)
AST: 31 U/L (ref 15–41)
Albumin: 4.1 g/dL (ref 3.5–5.0)
Alkaline Phosphatase: 79 U/L (ref 38–126)
Anion gap: 6 (ref 5–15)
BUN: 11 mg/dL (ref 8–23)
CO2: 26 mmol/L (ref 22–32)
Calcium: 9.1 mg/dL (ref 8.9–10.3)
Chloride: 107 mmol/L (ref 98–111)
Creatinine: 0.81 mg/dL (ref 0.44–1.00)
GFR, Estimated: >60 mL/min (ref >60)
Glucose, Bld: 103 mg/dL — ABNORMAL HIGH (ref 70–99)
Potassium: 3.7 mmol/L (ref 3.5–5.1)
Sodium: 139 mmol/L (ref 135–145)
Total Bilirubin: 0.4 mg/dL (ref 0.0–1.2)
Total Protein: 6.3 g/dL — ABNORMAL LOW (ref 6.5–8.1)

## 2023-09-08 LAB — FERRITIN: Ferritin: 371 ng/mL — ABNORMAL HIGH (ref 11–307)

## 2023-09-08 MED ORDER — SODIUM CHLORIDE 0.9 % IV SOLN: INTRAVENOUS | Status: DC

## 2023-09-08 MED ORDER — DIPHENHYDRAMINE HCL 50 MG/ML IJ SOLN
50.0000 mg | Freq: Once | INTRAMUSCULAR | Status: AC
  Administered 2023-09-08: 50 mg via INTRAVENOUS
  Filled 2023-09-08: qty 1

## 2023-09-08 MED ORDER — HEPARIN SOD (PORK) LOCK FLUSH 100 UNIT/ML IV SOLN
500.0000 [IU] | Freq: Once | INTRAVENOUS | Status: AC | PRN
  Administered 2023-09-08: 500 [IU]

## 2023-09-08 MED ORDER — DEXAMETHASONE SODIUM PHOSPHATE 10 MG/ML IJ SOLN
10.0000 mg | Freq: Once | INTRAMUSCULAR | Status: AC
Start: 2023-09-08 — End: 2023-09-08
  Administered 2023-09-08: 10 mg via INTRAVENOUS
  Filled 2023-09-08: qty 1

## 2023-09-08 MED ORDER — FAMOTIDINE IN NACL 20-0.9 MG/50ML-% IV SOLN
20.0000 mg | Freq: Once | INTRAVENOUS | Status: AC
Start: 2023-09-08 — End: 2023-09-08
  Administered 2023-09-08: 20 mg via INTRAVENOUS
  Filled 2023-09-08: qty 50

## 2023-09-08 MED ORDER — SODIUM CHLORIDE 0.9% FLUSH
10.0000 mL | Freq: Once | INTRAVENOUS | Status: AC
  Administered 2023-09-08: 10 mL

## 2023-09-08 MED ORDER — SODIUM CHLORIDE 0.9% FLUSH
10.0000 mL | INTRAVENOUS | Status: DC | PRN
  Administered 2023-09-08: 10 mL

## 2023-09-08 MED ORDER — PACLITAXEL CHEMO INJECTION 300 MG/50ML
80.0000 mg/m2 | Freq: Once | INTRAVENOUS | Status: AC
  Administered 2023-09-08: 150 mg via INTRAVENOUS
  Filled 2023-09-08: qty 25

## 2023-09-08 NOTE — Patient Instructions (Signed)

## 2023-09-12 ENCOUNTER — Other Ambulatory Visit: Payer: Self-pay

## 2023-09-14 ENCOUNTER — Telehealth: Payer: Self-pay

## 2023-09-14 ENCOUNTER — Inpatient Hospital Stay

## 2023-09-14 ENCOUNTER — Inpatient Hospital Stay (HOSPITAL_BASED_OUTPATIENT_CLINIC_OR_DEPARTMENT_OTHER): Admitting: Hematology and Oncology

## 2023-09-14 VITALS — BP 124/80 | HR 93 | Temp 97.3°F | Resp 16 | Wt 168.8 lb

## 2023-09-14 DIAGNOSIS — Z170 Estrogen receptor positive status [ER+]: Secondary | ICD-10-CM

## 2023-09-14 DIAGNOSIS — C50411 Malignant neoplasm of upper-outer quadrant of right female breast: Secondary | ICD-10-CM

## 2023-09-14 DIAGNOSIS — Z5111 Encounter for antineoplastic chemotherapy: Secondary | ICD-10-CM | POA: Diagnosis not present

## 2023-09-14 DIAGNOSIS — Z95828 Presence of other vascular implants and grafts: Secondary | ICD-10-CM

## 2023-09-14 LAB — CMP (CANCER CENTER ONLY)
ALT: 38 U/L (ref 0–44)
AST: 23 U/L (ref 15–41)
Albumin: 4.3 g/dL (ref 3.5–5.0)
Alkaline Phosphatase: 81 U/L (ref 38–126)
Anion gap: 8 (ref 5–15)
BUN: 12 mg/dL (ref 8–23)
CO2: 27 mmol/L (ref 22–32)
Calcium: 9.4 mg/dL (ref 8.9–10.3)
Chloride: 105 mmol/L (ref 98–111)
Creatinine: 0.87 mg/dL (ref 0.44–1.00)
GFR, Estimated: >60 mL/min (ref >60)
Glucose, Bld: 103 mg/dL — ABNORMAL HIGH (ref 70–99)
Potassium: 4 mmol/L (ref 3.5–5.1)
Sodium: 140 mmol/L (ref 135–145)
Total Bilirubin: 0.5 mg/dL (ref 0.0–1.2)
Total Protein: 6.6 g/dL (ref 6.5–8.1)

## 2023-09-14 LAB — CBC WITH DIFFERENTIAL (CANCER CENTER ONLY)
Abs Immature Granulocytes: 0.02 10*3/uL (ref 0.00–0.07)
Basophils Absolute: 0 10*3/uL (ref 0.0–0.1)
Basophils Relative: 1 %
Eosinophils Absolute: 0.1 10*3/uL (ref 0.0–0.5)
Eosinophils Relative: 2 %
HCT: 31.2 % — ABNORMAL LOW (ref 36.0–46.0)
Hemoglobin: 10.9 g/dL — ABNORMAL LOW (ref 12.0–15.0)
Immature Granulocytes: 1 %
Lymphocytes Relative: 31 %
Lymphs Abs: 1.2 10*3/uL (ref 0.7–4.0)
MCH: 34.3 pg — ABNORMAL HIGH (ref 26.0–34.0)
MCHC: 34.9 g/dL (ref 30.0–36.0)
MCV: 98.1 fL (ref 80.0–100.0)
Monocytes Absolute: 0.3 10*3/uL (ref 0.1–1.0)
Monocytes Relative: 8 %
Neutro Abs: 2.2 10*3/uL (ref 1.7–7.7)
Neutrophils Relative %: 57 %
Platelet Count: 339 10*3/uL (ref 150–400)
RBC: 3.18 MIL/uL — ABNORMAL LOW (ref 3.87–5.11)
RDW: 14.5 % (ref 11.5–15.5)
WBC Count: 3.9 10*3/uL — ABNORMAL LOW (ref 4.0–10.5)
nRBC: 0 % (ref 0.0–0.2)

## 2023-09-14 MED ORDER — SODIUM CHLORIDE 0.9 % IV SOLN: INTRAVENOUS | Status: DC

## 2023-09-14 MED ORDER — SODIUM CHLORIDE 0.9% FLUSH
10.0000 mL | INTRAVENOUS | Status: DC | PRN
  Administered 2023-09-14: 10 mL

## 2023-09-14 MED ORDER — SODIUM CHLORIDE 0.9 % IV SOLN
80.0000 mg/m2 | Freq: Once | INTRAVENOUS | Status: AC
  Administered 2023-09-14: 150 mg via INTRAVENOUS
  Filled 2023-09-14: qty 25

## 2023-09-14 MED ORDER — DIPHENHYDRAMINE HCL 50 MG/ML IJ SOLN
50.0000 mg | Freq: Once | INTRAMUSCULAR | Status: AC
  Administered 2023-09-14: 50 mg via INTRAVENOUS
  Filled 2023-09-14: qty 1

## 2023-09-14 MED ORDER — DEXAMETHASONE SODIUM PHOSPHATE 10 MG/ML IJ SOLN
10.0000 mg | Freq: Once | INTRAMUSCULAR | Status: AC
  Administered 2023-09-14: 10 mg via INTRAVENOUS
  Filled 2023-09-14: qty 1

## 2023-09-14 MED ORDER — FAMOTIDINE IN NACL 20-0.9 MG/50ML-% IV SOLN
20.0000 mg | Freq: Once | INTRAVENOUS | Status: AC
  Administered 2023-09-14: 20 mg via INTRAVENOUS
  Filled 2023-09-14: qty 50

## 2023-09-14 MED ORDER — SODIUM CHLORIDE 0.9% FLUSH
10.0000 mL | Freq: Once | INTRAVENOUS | Status: AC
Start: 2023-09-14 — End: 2023-09-14
  Administered 2023-09-14: 10 mL

## 2023-09-14 MED ORDER — HEPARIN SOD (PORK) LOCK FLUSH 100 UNIT/ML IV SOLN
500.0000 [IU] | Freq: Once | INTRAVENOUS | Status: AC | PRN
Start: 2023-09-14 — End: 2023-09-14
  Administered 2023-09-14: 500 [IU]

## 2023-09-14 NOTE — Patient Instructions (Signed)

## 2023-09-14 NOTE — Telephone Encounter (Signed)
 Called and spoke with pt regarding appts today. She states she thought her appts were 09/15/23 as she always has tx on Thursdays. Advised pt her appts are today. Pt states she can be here in 15 min. Per Consulting civil engineer, [place]. Pt is on her way.

## 2023-09-14 NOTE — Assessment & Plan Note (Addendum)
 Assessment and Plan Assessment & Plan Chemotherapy side effects, now on weekly paclitaxel  Experiencing muscle aches, loose stools, tingling in extremities, dizziness, dry mouth, and urinary frequency. Symptoms are present but not debilitating. Willing to continue chemotherapy. - Continue current chemotherapy regimen. - Advise increased rest and awareness of side effects. - Ensure adequate hydration.  Dizziness Occasional orthostatic dizziness, not severe enough to alter treatment. - Advise to maintain adequate hydration.  Tingling sensation Intermittent tingling in fingers and toes, continue to monitor If it persists or gets worse, may have to dose reduce.  Urinary frequency Increased frequency and fullness sensation, likely chemotherapy-related. No dysuria. Urine culture insignificant. - Continue cranberry tablets with Mentos D.

## 2023-09-14 NOTE — Progress Notes (Signed)
 [facility] CONSULT NOTE  Patient Care Team: Zorita Hiss, NP as PCP - General (Nurse Practitioner) Alane Hsu, RN as Oncology Nurse Navigator Auther Bo, RN as Oncology Nurse Navigator Lockie Rima, MD as Consulting Physician (General Surgery) Murleen Arms, MD as Consulting Physician (Hematology and Oncology) Johna Myers, MD as Consulting Physician (Radiation Oncology)  CHIEF COMPLAINTS/PURPOSE OF CONSULTATION:  Newly diagnosed breast cancer  HISTORY OF PRESENTING ILLNESS:  Brittany [name] 68 y.o. female is here because of recent diagnosis of right breast cancer  I reviewed her records extensively and collaborated the history with the patient.  SUMMARY OF ONCOLOGIC HISTORY: Oncology History  Malignant neoplasm of upper-outer quadrant of right breast in female, estrogen receptor positive (HCC)  02/22/2023 Mammogram   Screening mammogram on February 22, 2023 showed possible mass in the right breast with calcifications warranting further evaluation.  No findings suspicious for malignancy in the left breast.  Diagnostic mammogram done confirmed a persistent irregular mass within the central right breast, adjacent to the mass are slightly heterogeneous calcifications.  Ultrasound showed 1.1 x 1.2 x 1.3 cm irregular hypoechoic mass at 10 o'clock position of the right breast 1 cm from the nipple, no abnormal right axillary lymph nodes are noted.   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  Prognostic showed ER 100% positive strong staining PR 95% positive strong staining Ki-67 of 5% and HER2 2+   03/14/2023 Initial Diagnosis   Malignant neoplasm of upper-outer quadrant of right breast in female, estrogen receptor positive (HCC)    Genetic Testing   [facility] CancerNext-Expanded Panel+RNA was Negative. Report date is  03/27/2023.   The CancerNext-Expanded gene panel offered by [facility] and includes sequencing, rearrangement, and RNA analysis for the following 71 genes: AIP, ALK, APC, ATM, AXIN2, BAP1, BARD1, BMPR1A, BRCA1, BRCA2, BRIP1, CDC73, CDH1, CDK4, CDKN1B, CDKN2A, CHEK2, CTNNA1, DICER1, FH, FLCN, KIF1B, LZTR1, MAX, MEN1, MET, MLH1, MSH2, MSH3, MSH6, MUTYH, NF1, NF2, NTHL1, PALB2, PHOX2B, PMS2, POT1, PRKAR1A, PTCH1, PTEN, RAD51C, RAD51D, RB1, RET, SDHA, SDHAF2, SDHB, SDHC, SDHD, SMAD4, SMARCA4, SMARCB1, SMARCE1, STK11, SUFU, TMEM127, TP53, TSC1, TSC2, and VHL (sequencing and deletion/duplication); EGFR, EGLN1, HOXB13, KIT, MITF, PDGFRA, POLD1, and POLE (sequencing only); EPCAM and GREM1 (deletion/duplication only).    05/31/2023 Cancer Staging   Staging form: Breast, AJCC 8th Edition - Pathologic stage from 05/31/2023: Stage IB (pT1c, pN2a, cM0, G2, ER+, PR+, HER2-) - Signed by Murleen Arms, MD on 05/31/2023 Stage prefix: Initial diagnosis Method of lymph node assessment: Axillary lymph node dissection Histologic grading system: 3 grade system   06/09/2023 -  Chemotherapy   Patient is on Treatment Plan : BREAST DOSE DENSE AC q14d / PACLitaxel  q7d      Discussed the use of AI scribe software for clinical note transcription with the patient, who gave verbal consent to proceed.  History of Present Illness Brittany [name] is a 68 year old female undergoing chemotherapy who presents with side effects from treatment.  She experiences muscle aches and loose stools, which are semi-formed. No joint pain is present. She also has intermittent tingling in her fingers and toes, particularly in her little toe, without numbness.  She feels dizzy, especially when standing, with a significant episode on Saturday causing her to 'plop' down. No dehydration is noted, and her urine is light and clear. She is not on blood  pressure medications.  A previous issue with urinary retention has improved with cranberry  tablets with Mentos D. She senses fullness in her bladder without burning or dark, smelly urine. A prior urine culture was insignificant, and her white blood cell count is nearly normal.  Tingling occurs in her arms, especially around her port, but no neck issues are reported. She has a dry mouth but no sores, and limits caffeine to a cup and a half of coffee in the morning.   MEDICAL HISTORY:  Past Medical History:  Diagnosis Date   Anxiety and depression    Elevated LDL cholesterol level    Elevated liver enzymes 07/23/2021   GERD (gastroesophageal reflux disease)    Malignant neoplasm of breast (female) (HCC)    Thyroid  disorder 11/14/2017    SURGICAL HISTORY: Past Surgical History:  Procedure Laterality Date   AXILLARY LYMPH NODE DISSECTION Right 05/18/2023   Procedure: RIGHT AXILLARY LYMPH NODE DISSECTION;  Surgeon: Lockie Rima, MD;  Location: [facility] OR;  Service: General;  Laterality: Right;  PEC BLOCK   BREAST BIOPSY Right 03/09/2023   US  RT BREAST BX W LOC DEV 1ST LESION IMG BX SPEC US  GUIDE 03/09/2023 [facility] MAMMOGRAPHY   BREAST BIOPSY  04/12/2023   US  RT RADIOACTIVE SEED LOC 04/12/2023 [facility] ULTRASOUND   BREAST LUMPECTOMY WITH RADIOACTIVE SEED AND SENTINEL LYMPH NODE BIOPSY Right 04/13/2023   Procedure: RIGHT BREAST SEED LOCALIZED LUMPECTOMY WITH SENTINEL NODE BIOPSY;  Surgeon: Lockie Rima, MD;  Location: [facility];  Service: General;  Laterality: Right;   HERNIA REPAIR     LAPAROSCOPIC HYSTERECTOMY  03/2017   Partial, pt still has both adnexa   PORTACATH PLACEMENT N/A 05/18/2023   Procedure: PORT PLACEMENT WITH ULTRASOUND GUIDANCE;  Surgeon: Lockie Rima, MD;  Location: [facility] OR;  Service: General;  Laterality: N/A;    SOCIAL HISTORY: Social History   Socioeconomic History   Marital status: Divorced    Spouse name: Not on file   Number of children: Not on file   Years of education: Not on file   Highest education level: Master's degree (e.g., MA, MS, MEng,  MEd, MSW, MBA)  Occupational History   Not on file  Tobacco Use   Smoking status: Former   Smokeless tobacco: Never   Tobacco comments:    stopped at age 89. smoked for 4-5 years 1 ppd  Vaping Use   Vaping status: Never Used  Substance and Sexual Activity   Alcohol use: Yes    Comment: WINE OCC   Drug use: Never   Sexual activity: Not Currently    Partners: Male    Birth control/protection: Surgical    Comment: hysterectomy  Other Topics Concern   Not on file  Social History Narrative   Not on file   Social Drivers of Health   Financial Resource Strain: Low Risk  (06/20/2023)   Overall Financial Resource Strain (CARDIA)    Difficulty of Paying Living Expenses: Not very hard  Food Insecurity: No Food Insecurity (06/20/2023)   Hunger Vital Sign    Worried About Running Out of Food in the Last Year: Never true    Ran Out of Food in the Last Year: Never true  Transportation Needs: No Transportation Needs (06/20/2023)   PRAPARE - Administrator, Civil Service (Medical): No    Lack of Transportation (Non-Medical): No  Physical Activity: Insufficiently Active (06/20/2023)   Exercise Vital Sign    Days of Exercise per Week: 3 days  Minutes of Exercise per Session: 30 min  Stress: Stress Concern Present (06/20/2023)   [employer] of Occupational Health - Occupational Stress Questionnaire    Feeling of Stress : To some extent  Social Connections: Unknown (06/20/2023)   Social Connection and Isolation Panel [NHANES]    Frequency of Communication with Friends and Family: More than three times a week    Frequency of Social Gatherings with Friends and Family: Once a week    Attends Religious Services: Patient declined    Database administrator or Organizations: No    Attends Engineer, structural: Not on file    Marital Status: Divorced  Intimate Partner Violence: Not At Risk (03/16/2023)   Humiliation, Afraid, Rape, and Kick questionnaire    Fear of Current  or Ex-Partner: No    Emotionally Abused: No    Physically Abused: No    Sexually Abused: No    FAMILY HISTORY: Family History  Problem Relation Age of Onset   Breast cancer Mother 20   Diabetes Mother    Hypertension Mother    Cancer Father 41 - 64       liver?   Diabetes Brother    Cancer Paternal Uncle    Breast cancer Paternal Grandmother 49    ALLERGIES:  is allergic to wellbutrin [bupropion].  MEDICATIONS:  Current Outpatient Medications  Medication Sig Dispense Refill   Biotin 5000 MCG CAPS Take 1 capsule by mouth daily.     ciprofloxacin  (CIPRO ) 500 MG tablet Take 1 tablet (500 mg total) by mouth 2 (two) times daily. 14 tablet 0   cycloSPORINE (RESTASIS) 0.05 % ophthalmic emulsion Place 1 drop into both eyes 2 (two) times daily.     dexamethasone  (DECADRON ) 4 MG tablet Take 2 tablets (8 mg total) by mouth daily for 3 days. Start the day after doxorubicin /cyclophosphamide  chemotherapy. Take with food. (Patient not taking: Reported on 05/26/2023) 30 tablet 1   EVENING PRIMROSE OIL PO Take 1 tablet by mouth daily.     Flaxseed, Linseed, (FLAX SEED OIL PO) Take 1 capsule by mouth daily.     fluconazole  (DIFLUCAN ) 200 MG tablet Take 1 tablet (200 mg total) by mouth daily. 2 tablet 0   glucosamine-chondroitin 500-400 MG tablet Take 1 tablet by mouth daily.     lidocaine -prilocaine  (EMLA ) cream Apply to affected area once (Patient not taking: Reported on 05/26/2023) 30 g 3   MAGNESIUM GLYCINATE PO Take 350 mg by mouth daily.     Methylsulfonylmethane (MSM) 1000 MG TABS Take 1 tablet by mouth 3 (three) times a week.     milk thistle 175 MG tablet Take 175 mg by mouth daily.     ondansetron  (ZOFRAN ) 8 MG tablet Take 1 tab (8 mg) by mouth every 8 hrs as needed for nausea/vomiting. Start third day after doxorubicin /cyclophosphamide  chemotherapy. (Patient not taking: Reported on 05/26/2023) 30 tablet 1   OVER THE COUNTER MEDICATION Holy basil -   5 times a week     PARoxetine  (PAXIL ) 10  MG tablet Take 1 tablet (10 mg total) by mouth daily. 90 tablet 1   prochlorperazine  (COMPAZINE ) 10 MG tablet Take 1 tablet (10 mg total) by mouth every 6 (six) hours as needed for nausea or vomiting. (Patient not taking: Reported on 05/26/2023) 30 tablet 1   pyridoxine (B-6) 100 MG tablet Take 100 mg by mouth daily. (Patient not taking: Reported on 05/26/2023)     vitamin B-12 (CYANOCOBALAMIN) 500 MCG tablet Take 500 mcg by  mouth daily. (Patient not taking: Reported on 05/26/2023)     vitamin C (ASCORBIC ACID) 250 MG tablet Take 250 mg by mouth daily. (Patient not taking: Reported on 05/26/2023)     VITAMIN D  PO Take 1 tablet by mouth daily.     No current facility-administered medications for this visit.   Facility-Administered Medications Ordered in Other Visits  Medication Dose Route Frequency Provider Last Rate Last Admin   0.9 %  sodium chloride  infusion   Intravenous Continuous Alvina Strother, MD   Stopped at 09/14/23 1632   sodium chloride  flush (NS) 0.9 % injection 10 mL  10 mL Intracatheter PRN Tarnesha Ulloa, MD   10 mL at 09/14/23 1633    REVIEW OF SYSTEMS:   Constitutional: Denies fevers, chills or abnormal night sweats Eyes: Denies blurriness of vision, double vision or watery eyes Ears, nose, mouth, throat, and face: Denies mucositis or sore throat Respiratory: Denies cough, dyspnea or wheezes Cardiovascular: Denies palpitation, chest discomfort or lower extremity swelling Gastrointestinal:  Denies nausea, heartburn or change in bowel habits Skin: Denies abnormal skin rashes Lymphatics: Denies new lymphadenopathy or easy bruising Neurological:Denies numbness, tingling or new weaknesses Behavioral/Psych: Mood is stable, no new changes  Breast: Denies any palpable lumps or discharge All other systems were reviewed with the patient and are negative.  PHYSICAL EXAMINATION: ECOG PERFORMANCE STATUS: 0 - Asymptomatic  Vitals:   09/14/23 1341  BP: 124/80  Pulse: 93  Resp: 16   Temp: (!) 97.3 F (36.3 C)  SpO2: 97%      Filed Weights   09/14/23 1341  Weight: 168 lb 12.8 oz (76.6 kg)     GENERAL:alert, no distress and comfortable Port site appears well. Chest: CTA bilaterally Heart: RRR No LE swelling  LABORATORY DATA:  I have reviewed the data as listed Lab Results  Component Value Date   WBC 3.9 (L) 09/14/2023   HGB 10.9 (L) 09/14/2023   HCT 31.2 (L) 09/14/2023   MCV 98.1 09/14/2023   PLT 339 09/14/2023   Lab Results  Component Value Date   NA 140 09/14/2023   K 4.0 09/14/2023   CL 105 09/14/2023   CO2 27 09/14/2023    RADIOGRAPHIC STUDIES: I have personally reviewed the radiological reports and agreed with the findings in the report.  ASSESSMENT AND PLAN:  Malignant neoplasm of upper-outer quadrant of right breast in female, estrogen receptor positive (HCC) Assessment and Plan Assessment & Plan Chemotherapy side effects, now on weekly paclitaxel  Experiencing muscle aches, loose stools, tingling in extremities, dizziness, dry mouth, and urinary frequency. Symptoms are present but not debilitating. Willing to continue chemotherapy. - Continue current chemotherapy regimen. - Advise increased rest and awareness of side effects. - Ensure adequate hydration.  Dizziness Occasional orthostatic dizziness, not severe enough to alter treatment. - Advise to maintain adequate hydration.  Tingling sensation Intermittent tingling in fingers and toes, continue to monitor If it persists or gets worse, may have to dose reduce.  Urinary frequency Increased frequency and fullness sensation, likely chemotherapy-related. No dysuria. Urine culture insignificant. - Continue cranberry tablets with Mentos D.       All questions were answered. The patient knows to call the clinic with any problems, questions or concerns.    Murleen Arms, MD 09/14/23

## 2023-09-15 ENCOUNTER — Telehealth: Payer: Self-pay

## 2023-09-15 NOTE — Telephone Encounter (Signed)
-----   Message from [place] sent at 09/09/2023  2:12 PM EDT ----- Ferritin is high, I remember pt mentioned she was taking iron. I dont think she will have to continue oral iron at this time.  Thanks,

## 2023-09-15 NOTE — Telephone Encounter (Signed)
 Pt is aware.

## 2023-09-22 ENCOUNTER — Inpatient Hospital Stay

## 2023-09-22 VITALS — BP 123/90 | HR 84 | Temp 98.1°F | Resp 16

## 2023-09-22 DIAGNOSIS — Z5111 Encounter for antineoplastic chemotherapy: Secondary | ICD-10-CM | POA: Diagnosis not present

## 2023-09-22 DIAGNOSIS — Z95828 Presence of other vascular implants and grafts: Secondary | ICD-10-CM

## 2023-09-22 DIAGNOSIS — Z170 Estrogen receptor positive status [ER+]: Secondary | ICD-10-CM

## 2023-09-22 LAB — CBC WITH DIFFERENTIAL (CANCER CENTER ONLY)
Abs Immature Granulocytes: 0.01 10*3/uL (ref 0.00–0.07)
Basophils Absolute: 0 10*3/uL (ref 0.0–0.1)
Basophils Relative: 1 %
Eosinophils Absolute: 0 10*3/uL (ref 0.0–0.5)
Eosinophils Relative: 1 %
HCT: 31.7 % — ABNORMAL LOW (ref 36.0–46.0)
Hemoglobin: 11 g/dL — ABNORMAL LOW (ref 12.0–15.0)
Immature Granulocytes: 0 %
Lymphocytes Relative: 52 %
Lymphs Abs: 1.5 10*3/uL (ref 0.7–4.0)
MCH: 34.1 pg — ABNORMAL HIGH (ref 26.0–34.0)
MCHC: 34.7 g/dL (ref 30.0–36.0)
MCV: 98.1 fL (ref 80.0–100.0)
Monocytes Absolute: 0.5 10*3/uL (ref 0.1–1.0)
Monocytes Relative: 17 %
Neutro Abs: 0.8 10*3/uL — ABNORMAL LOW (ref 1.7–7.7)
Neutrophils Relative %: 29 %
Platelet Count: 314 10*3/uL (ref 150–400)
RBC: 3.23 MIL/uL — ABNORMAL LOW (ref 3.87–5.11)
RDW: 13.8 % (ref 11.5–15.5)
WBC Count: 2.9 10*3/uL — ABNORMAL LOW (ref 4.0–10.5)
nRBC: 0 % (ref 0.0–0.2)

## 2023-09-22 LAB — CMP (CANCER CENTER ONLY)
ALT: 44 U/L (ref 0–44)
AST: 28 U/L (ref 15–41)
Albumin: 4.3 g/dL (ref 3.5–5.0)
Alkaline Phosphatase: 82 U/L (ref 38–126)
Anion gap: 6 (ref 5–15)
BUN: 13 mg/dL (ref 8–23)
CO2: 26 mmol/L (ref 22–32)
Calcium: 9.5 mg/dL (ref 8.9–10.3)
Chloride: 107 mmol/L (ref 98–111)
Creatinine: 0.79 mg/dL (ref 0.44–1.00)
GFR, Estimated: >60 mL/min (ref >60)
Glucose, Bld: 86 mg/dL (ref 70–99)
Potassium: 4 mmol/L (ref 3.5–5.1)
Sodium: 139 mmol/L (ref 135–145)
Total Bilirubin: 0.4 mg/dL (ref 0.0–1.2)
Total Protein: 6.6 g/dL (ref 6.5–8.1)

## 2023-09-22 MED ORDER — SODIUM CHLORIDE 0.9% FLUSH
10.0000 mL | Freq: Once | INTRAVENOUS | Status: AC
  Administered 2023-09-22: 10 mL

## 2023-09-22 MED ORDER — HEPARIN SOD (PORK) LOCK FLUSH 100 UNIT/ML IV SOLN
500.0000 [IU] | Freq: Once | INTRAVENOUS | Status: AC
  Administered 2023-09-22: 500 [IU]

## 2023-09-22 NOTE — Progress Notes (Signed)
 Patient ANC 0.8, Dr Arno Bibles out of office today, contacted Dr Lee Public, who suggested we postpone treatment x 1 week. This RN informed patient, who was at first upset with decision but after further explanation of the lab meaning by Landa Pine, RN, patient was agreeable to plan. Patient de-accessed and discharged to lobby ambulatory.

## 2023-09-28 ENCOUNTER — Telehealth: Payer: Self-pay

## 2023-09-29 ENCOUNTER — Inpatient Hospital Stay (HOSPITAL_BASED_OUTPATIENT_CLINIC_OR_DEPARTMENT_OTHER): Admitting: Hematology and Oncology

## 2023-09-29 ENCOUNTER — Inpatient Hospital Stay

## 2023-09-29 ENCOUNTER — Encounter: Payer: Self-pay | Admitting: *Deleted

## 2023-09-29 VITALS — BP 133/87 | HR 80 | Temp 97.3°F | Resp 17 | Wt 172.9 lb

## 2023-09-29 DIAGNOSIS — Z170 Estrogen receptor positive status [ER+]: Secondary | ICD-10-CM

## 2023-09-29 DIAGNOSIS — C50411 Malignant neoplasm of upper-outer quadrant of right female breast: Secondary | ICD-10-CM

## 2023-09-29 DIAGNOSIS — Z95828 Presence of other vascular implants and grafts: Secondary | ICD-10-CM

## 2023-09-29 DIAGNOSIS — Z5111 Encounter for antineoplastic chemotherapy: Secondary | ICD-10-CM | POA: Diagnosis not present

## 2023-09-29 LAB — CBC WITH DIFFERENTIAL (CANCER CENTER ONLY)
Abs Immature Granulocytes: 0.01 10*3/uL (ref 0.00–0.07)
Basophils Absolute: 0 10*3/uL (ref 0.0–0.1)
Basophils Relative: 1 %
Eosinophils Absolute: 0.1 10*3/uL (ref 0.0–0.5)
Eosinophils Relative: 2 %
HCT: 32.7 % — ABNORMAL LOW (ref 36.0–46.0)
Hemoglobin: 11.3 g/dL — ABNORMAL LOW (ref 12.0–15.0)
Immature Granulocytes: 0 %
Lymphocytes Relative: 55 %
Lymphs Abs: 1.9 10*3/uL (ref 0.7–4.0)
MCH: 34 pg (ref 26.0–34.0)
MCHC: 34.6 g/dL (ref 30.0–36.0)
MCV: 98.5 fL (ref 80.0–100.0)
Monocytes Absolute: 0.7 10*3/uL (ref 0.1–1.0)
Monocytes Relative: 21 %
Neutro Abs: 0.7 10*3/uL — ABNORMAL LOW (ref 1.7–7.7)
Neutrophils Relative %: 21 %
Platelet Count: 323 10*3/uL (ref 150–400)
RBC: 3.32 MIL/uL — ABNORMAL LOW (ref 3.87–5.11)
RDW: 13.3 % (ref 11.5–15.5)
WBC Count: 3.4 10*3/uL — ABNORMAL LOW (ref 4.0–10.5)
nRBC: 0 % (ref 0.0–0.2)

## 2023-09-29 LAB — CMP (CANCER CENTER ONLY)
ALT: 52 U/L — ABNORMAL HIGH (ref 0–44)
AST: 33 U/L (ref 15–41)
Albumin: 4.1 g/dL (ref 3.5–5.0)
Alkaline Phosphatase: 80 U/L (ref 38–126)
Anion gap: 5 (ref 5–15)
BUN: 11 mg/dL (ref 8–23)
CO2: 27 mmol/L (ref 22–32)
Calcium: 9.4 mg/dL (ref 8.9–10.3)
Chloride: 108 mmol/L (ref 98–111)
Creatinine: 0.77 mg/dL (ref 0.44–1.00)
GFR, Estimated: >60 mL/min (ref >60)
Glucose, Bld: 86 mg/dL (ref 70–99)
Potassium: 4 mmol/L (ref 3.5–5.1)
Sodium: 140 mmol/L (ref 135–145)
Total Bilirubin: 0.5 mg/dL (ref 0.0–1.2)
Total Protein: 6.4 g/dL — ABNORMAL LOW (ref 6.5–8.1)

## 2023-09-29 MED ORDER — SODIUM CHLORIDE 0.9% FLUSH
10.0000 mL | Freq: Once | INTRAVENOUS | Status: AC
  Administered 2023-09-29: 10 mL

## 2023-09-29 MED ORDER — FILGRASTIM-SNDZ 300 MCG/0.5ML IJ SOSY
300.0000 ug | PREFILLED_SYRINGE | Freq: Once | INTRAMUSCULAR | Status: AC
  Administered 2023-09-29: 300 ug via SUBCUTANEOUS
  Filled 2023-09-29: qty 0.5

## 2023-09-29 MED ORDER — HEPARIN SOD (PORK) LOCK FLUSH 100 UNIT/ML IV SOLN
500.0000 [IU] | Freq: Once | INTRAVENOUS | Status: AC
  Administered 2023-09-29: 500 [IU]

## 2023-09-29 NOTE — Progress Notes (Signed)
 Zarxio 300mcg x 3 days added to patient's supportive care plan per Dr. Arno Bibles.  Daylan Boggess, PharmD, MBA

## 2023-09-29 NOTE — Progress Notes (Signed)
 [facility] CONSULT NOTE  Patient Care Team: Zorita Hiss, NP as PCP - General (Nurse Practitioner) Alane Hsu, RN as Oncology Nurse Navigator Auther Bo, RN as Oncology Nurse Navigator Lockie Rima, MD as Consulting Physician (General Surgery) Murleen Arms, MD as Consulting Physician (Hematology and Oncology) Johna Myers, MD as Consulting Physician (Radiation Oncology)  CHIEF COMPLAINTS/PURPOSE OF CONSULTATION:  Newly diagnosed breast cancer  HISTORY OF PRESENTING ILLNESS:  Brittany [name] 68 y.o. female is here because of recent diagnosis of right breast cancer  I reviewed her records extensively and collaborated the history with the patient.  SUMMARY OF ONCOLOGIC HISTORY: Oncology History  Malignant neoplasm of upper-outer quadrant of right breast in female, estrogen receptor positive (HCC)  02/22/2023 Mammogram   Screening mammogram on February 22, 2023 showed possible mass in the right breast with calcifications warranting further evaluation.  No findings suspicious for malignancy in the left breast.  Diagnostic mammogram done confirmed a persistent irregular mass within the central right breast, adjacent to the mass are slightly heterogeneous calcifications.  Ultrasound showed 1.1 x 1.2 x 1.3 cm irregular hypoechoic mass at 10 o'clock position of the right breast 1 cm from the nipple, no abnormal right axillary lymph nodes are noted.   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  Prognostic showed ER 100% positive strong staining PR 95% positive strong staining Ki-67 of 5% and HER2 2+   03/14/2023 Initial Diagnosis   Malignant neoplasm of upper-outer quadrant of right breast in female, estrogen receptor positive (HCC)    Genetic Testing   [facility] CancerNext-Expanded Panel+RNA was Negative. Report date is  03/27/2023.   The CancerNext-Expanded gene panel offered by [facility] and includes sequencing, rearrangement, and RNA analysis for the following 71 genes: AIP, ALK, APC, ATM, AXIN2, BAP1, BARD1, BMPR1A, BRCA1, BRCA2, BRIP1, CDC73, CDH1, CDK4, CDKN1B, CDKN2A, CHEK2, CTNNA1, DICER1, FH, FLCN, KIF1B, LZTR1, MAX, MEN1, MET, MLH1, MSH2, MSH3, MSH6, MUTYH, NF1, NF2, NTHL1, PALB2, PHOX2B, PMS2, POT1, PRKAR1A, PTCH1, PTEN, RAD51C, RAD51D, RB1, RET, SDHA, SDHAF2, SDHB, SDHC, SDHD, SMAD4, SMARCA4, SMARCB1, SMARCE1, STK11, SUFU, TMEM127, TP53, TSC1, TSC2, and VHL (sequencing and deletion/duplication); EGFR, EGLN1, HOXB13, KIT, MITF, PDGFRA, POLD1, and POLE (sequencing only); EPCAM and GREM1 (deletion/duplication only).    05/31/2023 Cancer Staging   Staging form: Breast, AJCC 8th Edition - Pathologic stage from 05/31/2023: Stage IB (pT1c, pN2a, cM0, G2, ER+, PR+, HER2-) - Signed by Murleen Arms, MD on 05/31/2023 Stage prefix: Initial diagnosis Method of lymph node assessment: Axillary lymph node dissection Histologic grading system: 3 grade system   06/09/2023 -  Chemotherapy   Patient is on Treatment Plan : BREAST DOSE DENSE AC q14d / PACLitaxel  q7d      Discussed the use of AI scribe software for clinical note transcription with the patient, who gave verbal consent to proceed.  History of Present Illness  Brittany [name] is a 68 year old female undergoing chemotherapy who presents with low ANC levels preventing treatment continuation.  She is currently undergoing chemotherapy and was unable to receive her treatment last week due to low absolute neutrophil count (ANC) levels. Her ANC was 800 last time and has decreased to 700, which is below the threshold required to proceed with treatment. She expressed frustration about the situation, stating 'I fussed' when informed she could not receive treatment.  She feels better  this week compared to last week, despite the low ANC levels. She experiences  neuropathy that 'comes and goes,' describing it as 'numbish' but not completely numb. She finds relief using tiger balm on her toes. The neuropathy in her second toe existed prior to chemotherapy and she is not concerned about it.  No issues with her port, fevers, chills, breathing difficulties, bowel movements, or urination.   MEDICAL HISTORY:  Past Medical History:  Diagnosis Date   Anxiety and depression    Elevated LDL cholesterol level    Elevated liver enzymes 07/23/2021   GERD (gastroesophageal reflux disease)    Malignant neoplasm of breast (female) (HCC)    Thyroid  disorder 11/14/2017    SURGICAL HISTORY: Past Surgical History:  Procedure Laterality Date   AXILLARY LYMPH NODE DISSECTION Right 05/18/2023   Procedure: RIGHT AXILLARY LYMPH NODE DISSECTION;  Surgeon: Lockie Rima, MD;  Location: [facility] OR;  Service: General;  Laterality: Right;  PEC BLOCK   BREAST BIOPSY Right 03/09/2023   US  RT BREAST BX W LOC DEV 1ST LESION IMG BX SPEC US  GUIDE 03/09/2023 [facility] MAMMOGRAPHY   BREAST BIOPSY  04/12/2023   US  RT RADIOACTIVE SEED LOC 04/12/2023 [facility] ULTRASOUND   BREAST LUMPECTOMY WITH RADIOACTIVE SEED AND SENTINEL LYMPH NODE BIOPSY Right 04/13/2023   Procedure: RIGHT BREAST SEED LOCALIZED LUMPECTOMY WITH SENTINEL NODE BIOPSY;  Surgeon: Lockie Rima, MD;  Location: [facility];  Service: General;  Laterality: Right;   HERNIA REPAIR     LAPAROSCOPIC HYSTERECTOMY  03/2017   Partial, pt still has both adnexa   PORTACATH PLACEMENT N/A 05/18/2023   Procedure: PORT PLACEMENT WITH ULTRASOUND GUIDANCE;  Surgeon: Lockie Rima, MD;  Location: [facility] OR;  Service: General;  Laterality: N/A;    SOCIAL HISTORY: Social History   Socioeconomic History   Marital status: Divorced    Spouse name: Not on file   Number of children: Not on file   Years of education: Not on file   Highest education level: Master's degree (e.g., MA, MS, MEng, MEd, MSW, MBA)  Occupational History   Not on  file  Tobacco Use   Smoking status: Former   Smokeless tobacco: Never   Tobacco comments:    stopped at age 42. smoked for 4-5 years 1 ppd  Vaping Use   Vaping status: Never Used  Substance and Sexual Activity   Alcohol use: Yes    Comment: WINE OCC   Drug use: Never   Sexual activity: Not Currently    Partners: Male    Birth control/protection: Surgical    Comment: hysterectomy  Other Topics Concern   Not on file  Social History Narrative   Not on file   Social Drivers of Health   Financial Resource Strain: Low Risk  (06/20/2023)   Overall Financial Resource Strain (CARDIA)    Difficulty of Paying Living Expenses: Not very hard  Food Insecurity: No Food Insecurity (06/20/2023)   Hunger Vital Sign    Worried About Running Out of Food in the Last Year: Never true    Ran Out of Food in the Last Year: Never true  Transportation Needs: No Transportation Needs (06/20/2023)   PRAPARE - Administrator, Civil Service (Medical): No    Lack of Transportation (Non-Medical): No  Physical Activity: Insufficiently Active (06/20/2023)   Exercise Vital Sign    Days of Exercise per Week: 3 days    Minutes of Exercise per Session: 30 min  Stress: Stress Concern Present (06/20/2023)  [employer] of Occupational Health - Occupational Stress Questionnaire    Feeling of Stress : To some extent  Social Connections: Unknown (06/20/2023)   Social Connection and Isolation Panel [NHANES]    Frequency of Communication with Friends and Family: More than three times a week    Frequency of Social Gatherings with Friends and Family: Once a week    Attends Religious Services: Patient declined    Database administrator or Organizations: No    Attends Engineer, structural: Not on file    Marital Status: Divorced  Intimate Partner Violence: Not At Risk (03/16/2023)   Humiliation, Afraid, Rape, and Kick questionnaire    Fear of Current or Ex-Partner: No    Emotionally Abused: No     Physically Abused: No    Sexually Abused: No    FAMILY HISTORY: Family History  Problem Relation Age of Onset   Breast cancer Mother 44   Diabetes Mother    Hypertension Mother    Cancer Father 12 - 50       liver?   Diabetes Brother    Cancer Paternal Uncle    Breast cancer Paternal Grandmother 12    ALLERGIES:  is allergic to wellbutrin [bupropion].  MEDICATIONS:  Current Outpatient Medications  Medication Sig Dispense Refill   Biotin 5000 MCG CAPS Take 1 capsule by mouth daily.     ciprofloxacin  (CIPRO ) 500 MG tablet Take 1 tablet (500 mg total) by mouth 2 (two) times daily. 14 tablet 0   cycloSPORINE (RESTASIS) 0.05 % ophthalmic emulsion Place 1 drop into both eyes 2 (two) times daily.     dexamethasone  (DECADRON ) 4 MG tablet Take 2 tablets (8 mg total) by mouth daily for 3 days. Start the day after doxorubicin /cyclophosphamide  chemotherapy. Take with food. (Patient not taking: Reported on 05/26/2023) 30 tablet 1   EVENING PRIMROSE OIL PO Take 1 tablet by mouth daily.     Flaxseed, Linseed, (FLAX SEED OIL PO) Take 1 capsule by mouth daily.     fluconazole  (DIFLUCAN ) 200 MG tablet Take 1 tablet (200 mg total) by mouth daily. 2 tablet 0   glucosamine-chondroitin 500-400 MG tablet Take 1 tablet by mouth daily.     lidocaine -prilocaine  (EMLA ) cream Apply to affected area once (Patient not taking: Reported on 05/26/2023) 30 g 3   MAGNESIUM GLYCINATE PO Take 350 mg by mouth daily.     Methylsulfonylmethane (MSM) 1000 MG TABS Take 1 tablet by mouth 3 (three) times a week.     milk thistle 175 MG tablet Take 175 mg by mouth daily.     ondansetron  (ZOFRAN ) 8 MG tablet Take 1 tab (8 mg) by mouth every 8 hrs as needed for nausea/vomiting. Start third day after doxorubicin /cyclophosphamide  chemotherapy. (Patient not taking: Reported on 05/26/2023) 30 tablet 1   OVER THE COUNTER MEDICATION Holy basil -   5 times a week     PARoxetine  (PAXIL ) 10 MG tablet Take 1 tablet (10 mg total) by mouth  daily. 90 tablet 1   prochlorperazine  (COMPAZINE ) 10 MG tablet Take 1 tablet (10 mg total) by mouth every 6 (six) hours as needed for nausea or vomiting. (Patient not taking: Reported on 05/26/2023) 30 tablet 1   pyridoxine (B-6) 100 MG tablet Take 100 mg by mouth daily. (Patient not taking: Reported on 05/26/2023)     vitamin B-12 (CYANOCOBALAMIN) 500 MCG tablet Take 500 mcg by mouth daily. (Patient not taking: Reported on 05/26/2023)     vitamin C (ASCORBIC  ACID) 250 MG tablet Take 250 mg by mouth daily. (Patient not taking: Reported on 05/26/2023)     VITAMIN D  PO Take 1 tablet by mouth daily.     No current facility-administered medications for this visit.    REVIEW OF SYSTEMS:   Constitutional: Denies fevers, chills or abnormal night sweats Eyes: Denies blurriness of vision, double vision or watery eyes Ears, nose, mouth, throat, and face: Denies mucositis or sore throat Respiratory: Denies cough, dyspnea or wheezes Cardiovascular: Denies palpitation, chest discomfort or lower extremity swelling Gastrointestinal:  Denies nausea, heartburn or change in bowel habits Skin: Denies abnormal skin rashes Lymphatics: Denies new lymphadenopathy or easy bruising Neurological:Denies numbness, tingling or new weaknesses Behavioral/Psych: Mood is stable, no new changes  Breast: Denies any palpable lumps or discharge All other systems were reviewed with the patient and are negative.  PHYSICAL EXAMINATION: ECOG PERFORMANCE STATUS: 0 - Asymptomatic  Vitals:   09/29/23 1155  BP: 133/87  Pulse: 80  Resp: 17  Temp: (!) 97.3 F (36.3 C)  SpO2: 96%      Filed Weights   09/29/23 1155  Weight: 172 lb 14.4 oz (78.4 kg)    Physical Exam Constitutional:      Appearance: Normal appearance.  Cardiovascular:     Rate and Rhythm: Normal rate and regular rhythm.     Pulses: Normal pulses.     Heart sounds: Normal heart sounds.  Chest:     Comments: Bilateral breasts inspected and palpated. NO  palpable masses or regional adenopathy Musculoskeletal:        General: Normal range of motion.     Cervical back: Normal range of motion and neck supple. No rigidity.  Lymphadenopathy:     Cervical: No cervical adenopathy.  Skin:    General: Skin is warm and dry.  Neurological:     Mental Status: She is alert.      LABORATORY DATA:  I have reviewed the data as listed Lab Results  Component Value Date   WBC 3.4 (L) 09/29/2023   HGB 11.3 (L) 09/29/2023   HCT 32.7 (L) 09/29/2023   MCV 98.5 09/29/2023   PLT 323 09/29/2023   Lab Results  Component Value Date   NA 139 09/22/2023   K 4.0 09/22/2023   CL 107 09/22/2023   CO2 26 09/22/2023    RADIOGRAPHIC STUDIES: I have personally reviewed the radiological reports and agreed with the findings in the report.  ASSESSMENT AND PLAN:   Malignant neoplasm of upper-outer quadrant of right breast in female, estrogen receptor positive (HCC)  on adj chemotherapy with ddAC T. Assessment & Plan Neutropenia Neutropenia secondary to chemotherapy with ANC at 700, decreased from 800. Supportive measures needed to maintain treatment schedule. - Initiate three days of zarxio to boost ANC, starting today if possible. - paclitaxel  dose reduced to 60 mg/m2 given persistent neutropenia.  Chemotherapy-induced peripheral neuropathy Intermittent numbness in toes, manageable with topical treatments. We will continue to monitor at this time.  Time spent: 20 min  All questions were answered. The patient knows to call the clinic with any problems, questions or concerns.    Murleen Arms, MD 09/29/23

## 2023-09-29 NOTE — Patient Instructions (Signed)
 Filgrastim Injection What is this medication? FILGRASTIM (fil GRA stim) lowers the risk of infection in people who are receiving chemotherapy. It works by Systems analyst make more white blood cells, which protects your body from infection. It may also be used to help people who have been exposed to high doses of radiation. It can be used to help prepare your body before a stem cell transplant. It works by helping your bone marrow make and release stem cells into the blood. This medicine may be used for other purposes; ask your health care provider or pharmacist if you have questions. COMMON BRAND NAME(S): Neupogen, Nivestym, Nypozi, Releuko, Zarxio What should I tell my care team before I take this medication? They need to know if you have any of these conditions: History of blood diseases, such as sickle cell anemia Kidney disease Recent or ongoing radiation An unusual or allergic reaction to filgrastim, pegfilgrastim, latex, rubber, other medications, foods, dyes, or preservatives Pregnant or trying to get pregnant Breast-feeding How should I use this medication? This medication is injected under the skin or into a vein. It is usually given by your care team in a hospital or clinic setting. It may be given at home. If you get this medication at home, you will be taught how to prepare and give it. Use exactly as directed. Take it as directed on the prescription label at the same time every day. Keep taking it unless your care team tells you to stop. It is important that you put your used needles and syringes in a special sharps container. Do not put them in a trash can. If you do not have a sharps container, call your pharmacist or care team to get one. This medication comes with INSTRUCTIONS FOR USE. Ask your pharmacist for directions on how to use this medication. Read the information carefully. Talk to your pharmacist or care team if you have questions. Talk to your care team about the use of  this medication in children. While it may be prescribed for children for selected conditions, precautions do apply. Overdosage: If you think you have taken too much of this medicine contact a poison control center or emergency room at once. NOTE: This medicine is only for you. Do not share this medicine with others. What if I miss a dose? It is important not to miss any doses. Talk to your care team about what to do if you miss a dose. What may interact with this medication? Medications that may cause a release of neutrophils, such as lithium This list may not describe all possible interactions. Give your health care provider a list of all the medicines, herbs, non-prescription drugs, or dietary supplements you use. Also tell them if you smoke, drink alcohol, or use illegal drugs. Some items may interact with your medicine. What should I watch for while using this medication? Your condition will be monitored carefully while you are receiving this medication. You may need bloodwork while taking this medication. Talk to your care team about your risk of cancer. You may be more at risk for certain types of cancer if you take this medication. What side effects may I notice from receiving this medication? Side effects that you should report to your care team as soon as possible: Allergic reactions--skin rash, itching, hives, swelling of the face, lips, tongue, or throat Capillary leak syndrome--stomach or muscle pain, unusual weakness or fatigue, feeling faint or lightheaded, decrease in the amount of urine, swelling of the ankles, hands,  or feet, trouble breathing High white blood cell level--fever, fatigue, trouble breathing, night sweats, change in vision, weight loss Inflammation of the aorta--fever, fatigue, back, chest, or stomach pain, severe headache Kidney injury (glomerulonephritis)--decrease in the amount of urine, red or dark brown urine, foamy or bubbly urine, swelling of the ankles, hands,  or feet Shortness of breath or trouble breathing Spleen injury--pain in upper left stomach or shoulder Unusual bruising or bleeding Side effects that usually do not require medical attention (report to your care team if they continue or are bothersome): Back pain Bone pain Fatigue Fever Headache Nausea This list may not describe all possible side effects. Call your doctor for medical advice about side effects. You may report side effects to FDA at 1-800-FDA-1088. Where should I keep my medication? Keep out of the reach of children and pets. Keep this medication in the original packaging until you are ready to take it. Protect from light. See product for storage information. Each product may have different instructions. Get rid of any unused medication after the expiration date. To get rid of medications that are no longer needed or have expired: Take the medication to a medications take-back program. Check with your pharmacy or law enforcement to find a location. If you cannot return the medication, ask your pharmacist or care team how to get rid of this medication safely. NOTE: This sheet is a summary. It may not cover all possible information. If you have questions about this medicine, talk to your doctor, pharmacist, or health care provider.  2024 Elsevier/Gold Standard (2021-09-17 00:00:00)

## 2023-09-30 ENCOUNTER — Inpatient Hospital Stay

## 2023-09-30 VITALS — BP 128/88 | HR 102 | Temp 98.3°F | Resp 17

## 2023-09-30 DIAGNOSIS — Z95828 Presence of other vascular implants and grafts: Secondary | ICD-10-CM

## 2023-09-30 DIAGNOSIS — Z5111 Encounter for antineoplastic chemotherapy: Secondary | ICD-10-CM | POA: Diagnosis not present

## 2023-09-30 MED ORDER — FILGRASTIM-SNDZ 300 MCG/0.5ML IJ SOSY
300.0000 ug | PREFILLED_SYRINGE | Freq: Once | INTRAMUSCULAR | Status: AC
Start: 2023-09-30 — End: 2023-09-30
  Administered 2023-09-30: 300 ug via SUBCUTANEOUS
  Filled 2023-09-30: qty 0.5

## 2023-10-01 ENCOUNTER — Inpatient Hospital Stay

## 2023-10-01 VITALS — BP 123/86 | HR 90 | Temp 98.1°F | Resp 17

## 2023-10-01 DIAGNOSIS — Z95828 Presence of other vascular implants and grafts: Secondary | ICD-10-CM

## 2023-10-01 DIAGNOSIS — Z5111 Encounter for antineoplastic chemotherapy: Secondary | ICD-10-CM | POA: Diagnosis not present

## 2023-10-01 MED ORDER — FILGRASTIM-SNDZ 300 MCG/0.5ML IJ SOSY
300.0000 ug | PREFILLED_SYRINGE | Freq: Once | INTRAMUSCULAR | Status: AC
  Administered 2023-10-01: 300 ug via SUBCUTANEOUS
  Filled 2023-10-01: qty 0.5

## 2023-10-07 ENCOUNTER — Inpatient Hospital Stay

## 2023-10-07 ENCOUNTER — Encounter: Payer: Self-pay | Admitting: *Deleted

## 2023-10-07 VITALS — BP 126/85 | HR 90 | Temp 98.3°F | Resp 18 | Wt 169.8 lb

## 2023-10-07 DIAGNOSIS — Z170 Estrogen receptor positive status [ER+]: Secondary | ICD-10-CM

## 2023-10-07 DIAGNOSIS — Z5111 Encounter for antineoplastic chemotherapy: Secondary | ICD-10-CM | POA: Diagnosis not present

## 2023-10-07 LAB — CBC WITH DIFFERENTIAL (CANCER CENTER ONLY)
Abs Immature Granulocytes: 0.29 10*3/uL — ABNORMAL HIGH (ref 0.00–0.07)
Basophils Absolute: 0 10*3/uL (ref 0.0–0.1)
Basophils Relative: 1 %
Eosinophils Absolute: 0.1 10*3/uL (ref 0.0–0.5)
Eosinophils Relative: 2 %
HCT: 35.8 % — ABNORMAL LOW (ref 36.0–46.0)
Hemoglobin: 12.4 g/dL (ref 12.0–15.0)
Immature Granulocytes: 4 %
Lymphocytes Relative: 33 %
Lymphs Abs: 2.4 10*3/uL (ref 0.7–4.0)
MCH: 33.8 pg (ref 26.0–34.0)
MCHC: 34.6 g/dL (ref 30.0–36.0)
MCV: 97.5 fL (ref 80.0–100.0)
Monocytes Absolute: 1 10*3/uL (ref 0.1–1.0)
Monocytes Relative: 14 %
Neutro Abs: 3.5 10*3/uL (ref 1.7–7.7)
Neutrophils Relative %: 46 %
Platelet Count: 230 10*3/uL (ref 150–400)
RBC: 3.67 MIL/uL — ABNORMAL LOW (ref 3.87–5.11)
RDW: 12.7 % (ref 11.5–15.5)
WBC Count: 7.3 10*3/uL (ref 4.0–10.5)
nRBC: 0 % (ref 0.0–0.2)

## 2023-10-07 LAB — CMP (CANCER CENTER ONLY)
ALT: 40 U/L (ref 0–44)
AST: 24 U/L (ref 15–41)
Albumin: 4.3 g/dL (ref 3.5–5.0)
Alkaline Phosphatase: 98 U/L (ref 38–126)
Anion gap: 5 (ref 5–15)
BUN: 12 mg/dL (ref 8–23)
CO2: 28 mmol/L (ref 22–32)
Calcium: 9.3 mg/dL (ref 8.9–10.3)
Chloride: 107 mmol/L (ref 98–111)
Creatinine: 0.81 mg/dL (ref 0.44–1.00)
GFR, Estimated: >60 mL/min (ref >60)
Glucose, Bld: 88 mg/dL (ref 70–99)
Potassium: 3.9 mmol/L (ref 3.5–5.1)
Sodium: 140 mmol/L (ref 135–145)
Total Bilirubin: 0.6 mg/dL (ref 0.0–1.2)
Total Protein: 6.6 g/dL (ref 6.5–8.1)

## 2023-10-07 MED ORDER — SODIUM CHLORIDE 0.9 % IV SOLN: INTRAVENOUS | Status: DC

## 2023-10-07 MED ORDER — FAMOTIDINE IN NACL 20-0.9 MG/50ML-% IV SOLN
20.0000 mg | Freq: Once | INTRAVENOUS | Status: AC
  Administered 2023-10-07: 20 mg via INTRAVENOUS
  Filled 2023-10-07: qty 50

## 2023-10-07 MED ORDER — DIPHENHYDRAMINE HCL 50 MG/ML IJ SOLN
50.0000 mg | Freq: Once | INTRAMUSCULAR | Status: AC
  Administered 2023-10-07: 50 mg via INTRAVENOUS
  Filled 2023-10-07: qty 1

## 2023-10-07 MED ORDER — SODIUM CHLORIDE 0.9 % IV SOLN
60.0000 mg/m2 | Freq: Once | INTRAVENOUS | Status: AC
  Administered 2023-10-07: 114 mg via INTRAVENOUS
  Filled 2023-10-07: qty 19

## 2023-10-07 MED ORDER — DEXAMETHASONE SODIUM PHOSPHATE 10 MG/ML IJ SOLN
10.0000 mg | Freq: Once | INTRAMUSCULAR | Status: AC
  Administered 2023-10-07: 10 mg via INTRAVENOUS
  Filled 2023-10-07: qty 1

## 2023-10-07 NOTE — Patient Instructions (Signed)

## 2023-10-10 ENCOUNTER — Encounter: Payer: Self-pay | Admitting: *Deleted

## 2023-10-14 ENCOUNTER — Inpatient Hospital Stay: Attending: Hematology and Oncology

## 2023-10-14 ENCOUNTER — Inpatient Hospital Stay

## 2023-10-14 ENCOUNTER — Inpatient Hospital Stay (HOSPITAL_BASED_OUTPATIENT_CLINIC_OR_DEPARTMENT_OTHER): Attending: Hematology and Oncology | Admitting: Physician Assistant

## 2023-10-14 VITALS — BP 136/96 | HR 102 | Temp 98.2°F | Resp 17 | Wt 168.0 lb

## 2023-10-14 VITALS — HR 98

## 2023-10-14 DIAGNOSIS — Z87891 Personal history of nicotine dependence: Secondary | ICD-10-CM | POA: Diagnosis not present

## 2023-10-14 DIAGNOSIS — C50411 Malignant neoplasm of upper-outer quadrant of right female breast: Secondary | ICD-10-CM | POA: Insufficient documentation

## 2023-10-14 DIAGNOSIS — Z170 Estrogen receptor positive status [ER+]: Secondary | ICD-10-CM | POA: Diagnosis not present

## 2023-10-14 DIAGNOSIS — Z79632 Long term (current) use of antitumor antibiotic: Secondary | ICD-10-CM | POA: Diagnosis not present

## 2023-10-14 DIAGNOSIS — Z1721 Progesterone receptor positive status: Secondary | ICD-10-CM | POA: Diagnosis not present

## 2023-10-14 DIAGNOSIS — Z95828 Presence of other vascular implants and grafts: Secondary | ICD-10-CM

## 2023-10-14 DIAGNOSIS — Z5111 Encounter for antineoplastic chemotherapy: Secondary | ICD-10-CM | POA: Insufficient documentation

## 2023-10-14 DIAGNOSIS — Z1732 Human epidermal growth factor receptor 2 negative status: Secondary | ICD-10-CM | POA: Diagnosis not present

## 2023-10-14 DIAGNOSIS — Z79630 Long term (current) use of alkylating agent: Secondary | ICD-10-CM | POA: Diagnosis not present

## 2023-10-14 DIAGNOSIS — Z79899 Other long term (current) drug therapy: Secondary | ICD-10-CM | POA: Diagnosis not present

## 2023-10-14 LAB — CMP (CANCER CENTER ONLY)
ALT: 27 U/L (ref 0–44)
AST: 17 U/L (ref 15–41)
Albumin: 4.4 g/dL (ref 3.5–5.0)
Alkaline Phosphatase: 86 U/L (ref 38–126)
Anion gap: 5 (ref 5–15)
BUN: 12 mg/dL (ref 8–23)
CO2: 27 mmol/L (ref 22–32)
Calcium: 9.1 mg/dL (ref 8.9–10.3)
Chloride: 108 mmol/L (ref 98–111)
Creatinine: 0.7 mg/dL (ref 0.44–1.00)
GFR, Estimated: >60 mL/min (ref >60)
Glucose, Bld: 78 mg/dL (ref 70–99)
Potassium: 3.9 mmol/L (ref 3.5–5.1)
Sodium: 140 mmol/L (ref 135–145)
Total Bilirubin: 0.5 mg/dL (ref 0.0–1.2)
Total Protein: 6.8 g/dL (ref 6.5–8.1)

## 2023-10-14 LAB — CBC WITH DIFFERENTIAL (CANCER CENTER ONLY)
Abs Immature Granulocytes: 0.01 10*3/uL (ref 0.00–0.07)
Basophils Absolute: 0.1 10*3/uL (ref 0.0–0.1)
Basophils Relative: 1 %
Eosinophils Absolute: 0.1 10*3/uL (ref 0.0–0.5)
Eosinophils Relative: 2 %
HCT: 33.8 % — ABNORMAL LOW (ref 36.0–46.0)
Hemoglobin: 11.8 g/dL — ABNORMAL LOW (ref 12.0–15.0)
Immature Granulocytes: 0 %
Lymphocytes Relative: 36 %
Lymphs Abs: 1.6 10*3/uL (ref 0.7–4.0)
MCH: 33.7 pg (ref 26.0–34.0)
MCHC: 34.9 g/dL (ref 30.0–36.0)
MCV: 96.6 fL (ref 80.0–100.0)
Monocytes Absolute: 0.5 10*3/uL (ref 0.1–1.0)
Monocytes Relative: 12 %
Neutro Abs: 2.2 10*3/uL (ref 1.7–7.7)
Neutrophils Relative %: 49 %
Platelet Count: 335 10*3/uL (ref 150–400)
RBC: 3.5 MIL/uL — ABNORMAL LOW (ref 3.87–5.11)
RDW: 12.4 % (ref 11.5–15.5)
WBC Count: 4.5 10*3/uL (ref 4.0–10.5)
nRBC: 0 % (ref 0.0–0.2)

## 2023-10-14 MED ORDER — DEXAMETHASONE SODIUM PHOSPHATE 10 MG/ML IJ SOLN
10.0000 mg | Freq: Once | INTRAMUSCULAR | Status: AC
  Administered 2023-10-14: 10 mg via INTRAVENOUS
  Filled 2023-10-14: qty 1

## 2023-10-14 MED ORDER — SODIUM CHLORIDE 0.9 % IV SOLN: INTRAVENOUS | Status: DC

## 2023-10-14 MED ORDER — FAMOTIDINE IN NACL 20-0.9 MG/50ML-% IV SOLN
20.0000 mg | Freq: Once | INTRAVENOUS | Status: AC
  Administered 2023-10-14: 20 mg via INTRAVENOUS

## 2023-10-14 MED ORDER — DIPHENHYDRAMINE HCL 50 MG/ML IJ SOLN
50.0000 mg | Freq: Once | INTRAMUSCULAR | Status: AC
  Administered 2023-10-14: 50 mg via INTRAVENOUS
  Filled 2023-10-14: qty 1

## 2023-10-14 MED ORDER — SODIUM CHLORIDE 0.9 % IV SOLN
60.0000 mg/m2 | Freq: Once | INTRAVENOUS | Status: AC
  Administered 2023-10-14: 114 mg via INTRAVENOUS
  Filled 2023-10-14: qty 19

## 2023-10-14 MED ORDER — SODIUM CHLORIDE 0.9% FLUSH
10.0000 mL | Freq: Once | INTRAVENOUS | Status: AC
  Administered 2023-10-14: 10 mL

## 2023-10-15 ENCOUNTER — Encounter: Payer: Self-pay | Admitting: Hematology and Oncology

## 2023-10-15 NOTE — Progress Notes (Signed)
 [facility] PROGRESS NOTE  Patient Care Team: Brittany Hiss, NP as PCP - General (Nurse Practitioner) Brittany Hsu, RN as Oncology Nurse Navigator Brittany Bo, RN as Oncology Nurse Navigator Brittany Rima, MD as Consulting Physician (General Surgery) Brittany Arms, MD as Consulting Physician (Hematology and Oncology) Brittany Myers, MD as Consulting Physician (Radiation Oncology)  CHIEF COMPLAINTS/PURPOSE OF CONSULTATION:  Right breast cancer   SUMMARY OF ONCOLOGIC HISTORY: Oncology History  Malignant neoplasm of upper-outer quadrant of right breast in female, estrogen receptor positive (HCC)  02/22/2023 Mammogram   Screening mammogram on February 22, 2023 showed possible mass in the right breast with calcifications warranting further evaluation.  No findings suspicious for malignancy in the left breast.  Diagnostic mammogram done confirmed a persistent irregular mass within the central right breast, adjacent to the mass are slightly heterogeneous calcifications.  Ultrasound showed 1.1 x 1.2 x 1.3 cm irregular hypoechoic mass at 10 o'clock position of the right breast 1 cm from the nipple, no abnormal right axillary lymph nodes are noted.   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  Prognostic showed ER 100% positive strong staining PR 95% positive strong staining Ki-67 of 5% and HER2 2+   03/14/2023 Initial Diagnosis   Malignant neoplasm of upper-outer quadrant of right breast in female, estrogen receptor positive (HCC)    Genetic Testing   [facility] CancerNext-Expanded Panel+RNA was Negative. Report date is 03/27/2023.   The CancerNext-Expanded gene panel offered by [facility] and includes sequencing, rearrangement, and RNA analysis for the following 71 genes: AIP, ALK, APC, ATM, AXIN2, BAP1, BARD1, BMPR1A, BRCA1,  BRCA2, BRIP1, CDC73, CDH1, CDK4, CDKN1B, CDKN2A, CHEK2, CTNNA1, DICER1, FH, FLCN, KIF1B, LZTR1, MAX, MEN1, MET, MLH1, MSH2, MSH3, MSH6, MUTYH, NF1, NF2, NTHL1, PALB2, PHOX2B, PMS2, POT1, PRKAR1A, PTCH1, PTEN, RAD51C, RAD51D, RB1, RET, SDHA, SDHAF2, SDHB, SDHC, SDHD, SMAD4, SMARCA4, SMARCB1, SMARCE1, STK11, SUFU, TMEM127, TP53, TSC1, TSC2, and VHL (sequencing and deletion/duplication); EGFR, EGLN1, HOXB13, KIT, MITF, PDGFRA, POLD1, and POLE (sequencing only); EPCAM and GREM1 (deletion/duplication only).    05/31/2023 Cancer Staging   Staging form: Breast, AJCC 8th Edition - Pathologic stage from 05/31/2023: Stage IB (pT1c, pN2a, cM0, G2, ER+, PR+, HER2-) - Signed by Brittany Arms, MD on 05/31/2023 Stage prefix: Initial diagnosis Method of lymph node assessment: Axillary lymph node dissection Histologic grading system: 3 grade system   06/09/2023 -  Chemotherapy   Patient is on Treatment Plan : BREAST DOSE DENSE AC q14d / PACLitaxel  q7d      CURRENT TREATMENT: Weekly Taxol    INTERVAL HISTORY: Brittany [name] returns for a follow up prior to Cycle 13, Day 1 of weekly taxol .  She was last seen by Brittany [name] on 09/29/2023. In the interim, she denies any changes to her health. She is unaccompanied for this visit.   Brittany [name] reports that she is overall tolerating treatment without any new toxicities. Her energy and appetite are fairly stable. She is able to complete her ADLs on her own. Her weight is overall stable. She denies nausea, vomiting or bowel habit changes. She denies easy bruising or signs of bleeding. She reports her neuropathy is overall stable involving her toes and fingertips. The neuropathy is intermittent and doesn't interfere with grip or balance. She applies tiger balm to her feet and fingers which improves the sensation. She denies fevers, chills, sweats,  shortness of breath, chest pain or cough. She has no other complaints. Rest of the 10 point ROS is below.    MEDICAL HISTORY:   Past Medical History:  Diagnosis Date   Anxiety and depression    Elevated LDL cholesterol level    Elevated liver enzymes 07/23/2021   GERD (gastroesophageal reflux disease)    Malignant neoplasm of breast (female) (HCC)    Thyroid  disorder 11/14/2017    SURGICAL HISTORY: Past Surgical History:  Procedure Laterality Date   AXILLARY LYMPH NODE DISSECTION Right 05/18/2023   Procedure: RIGHT AXILLARY LYMPH NODE DISSECTION;  Surgeon: Brittany Rima, MD;  Location: [facility] OR;  Service: General;  Laterality: Right;  PEC BLOCK   BREAST BIOPSY Right 03/09/2023   US  RT BREAST BX W LOC DEV 1ST LESION IMG BX SPEC US  GUIDE 03/09/2023 [facility] MAMMOGRAPHY   BREAST BIOPSY  04/12/2023   US  RT RADIOACTIVE SEED LOC 04/12/2023 [facility] ULTRASOUND   BREAST LUMPECTOMY WITH RADIOACTIVE SEED AND SENTINEL LYMPH NODE BIOPSY Right 04/13/2023   Procedure: RIGHT BREAST SEED LOCALIZED LUMPECTOMY WITH SENTINEL NODE BIOPSY;  Surgeon: Brittany Rima, MD;  Location: [facility];  Service: General;  Laterality: Right;   HERNIA REPAIR     LAPAROSCOPIC HYSTERECTOMY  03/2017   Partial, pt still has both adnexa   PORTACATH PLACEMENT N/A 05/18/2023   Procedure: PORT PLACEMENT WITH ULTRASOUND GUIDANCE;  Surgeon: Brittany Rima, MD;  Location: [facility] OR;  Service: General;  Laterality: N/A;    SOCIAL HISTORY: Social History   Socioeconomic History   Marital status: Divorced    Spouse name: Not on file   Number of children: Not on file   Years of education: Not on file   Highest education level: Master's degree (e.g., MA, MS, MEng, MEd, MSW, MBA)  Occupational History   Not on file  Tobacco Use   Smoking status: Former   Smokeless tobacco: Never   Tobacco comments:    stopped at age 78. smoked for 4-5 years 1 ppd  Vaping Use   Vaping status: Never Used  Substance and Sexual Activity   Alcohol use: Yes    Comment: WINE OCC   Drug use: Never   Sexual activity: Not Currently    Partners: Male    Birth  control/protection: Surgical    Comment: hysterectomy  Other Topics Concern   Not on file  Social History Narrative   Not on file   Social Drivers of Health   Financial Resource Strain: Low Risk  (06/20/2023)   Overall Financial Resource Strain (CARDIA)    Difficulty of Paying Living Expenses: Not very hard  Food Insecurity: No Food Insecurity (06/20/2023)   Hunger Vital Sign    Worried About Running Out of Food in the Last Year: Never true    Ran Out of Food in the Last Year: Never true  Transportation Needs: No Transportation Needs (06/20/2023)   PRAPARE - Administrator, Civil Service (Medical): No    Lack of Transportation (Non-Medical): No  Physical Activity: Insufficiently Active (06/20/2023)   Exercise Vital Sign    Days of Exercise per Week: 3 days    Minutes of Exercise per Session: 30 min  Stress: Stress Concern Present (06/20/2023)   [employer] of Occupational Health - Occupational Stress Questionnaire    Feeling of Stress : To some extent  Social Connections: Unknown (06/20/2023)   Social Connection and Isolation Panel [NHANES]    Frequency of Communication with Friends and Family: More than three  times a week    Frequency of Social Gatherings with Friends and Family: Once a week    Attends Religious Services: Patient declined    Database administrator or Organizations: No    Attends Engineer, structural: Not on file    Marital Status: Divorced  Intimate Partner Violence: Not At Risk (03/16/2023)   Humiliation, Afraid, Rape, and Kick questionnaire    Fear of Current or Ex-Partner: No    Emotionally Abused: No    Physically Abused: No    Sexually Abused: No    FAMILY HISTORY: Family History  Problem Relation Age of Onset   Breast cancer Mother 90   Diabetes Mother    Hypertension Mother    Cancer Father 109 - 29       liver?   Diabetes Brother    Cancer Paternal Uncle    Breast cancer Paternal Grandmother 71    ALLERGIES:  is  allergic to wellbutrin [bupropion].  MEDICATIONS:  Current Outpatient Medications  Medication Sig Dispense Refill   Biotin 5000 MCG CAPS Take 1 capsule by mouth daily.     cycloSPORINE (RESTASIS) 0.05 % ophthalmic emulsion Place 1 drop into both eyes 2 (two) times daily.     EVENING PRIMROSE OIL PO Take 1 tablet by mouth daily.     Flaxseed, Linseed, (FLAX SEED OIL PO) Take 1 capsule by mouth daily.     glucosamine-chondroitin 500-400 MG tablet Take 1 tablet by mouth daily.     lidocaine -prilocaine  (EMLA ) cream Apply to affected area once 30 g 3   MAGNESIUM GLYCINATE PO Take 350 mg by mouth daily.     Methylsulfonylmethane (MSM) 1000 MG TABS Take 1 tablet by mouth 3 (three) times a week.     milk thistle 175 MG tablet Take 175 mg by mouth daily.     OVER THE COUNTER MEDICATION Holy basil -   5 times a week     pyridoxine (B-6) 100 MG tablet Take 100 mg by mouth daily.     vitamin B-12 (CYANOCOBALAMIN) 500 MCG tablet Take 500 mcg by mouth daily.     vitamin C (ASCORBIC ACID) 250 MG tablet Take 250 mg by mouth daily.     VITAMIN D  PO Take 1 tablet by mouth daily.     ciprofloxacin  (CIPRO ) 500 MG tablet Take 1 tablet (500 mg total) by mouth 2 (two) times daily. 14 tablet 0   dexamethasone  (DECADRON ) 4 MG tablet Take 2 tablets (8 mg total) by mouth daily for 3 days. Start the day after doxorubicin /cyclophosphamide  chemotherapy. Take with food. (Patient not taking: Reported on 05/26/2023) 30 tablet 1   fluconazole  (DIFLUCAN ) 200 MG tablet Take 1 tablet (200 mg total) by mouth daily. 2 tablet 0   ondansetron  (ZOFRAN ) 8 MG tablet Take 1 tab (8 mg) by mouth every 8 hrs as needed for nausea/vomiting. Start third day after doxorubicin /cyclophosphamide  chemotherapy. (Patient not taking: Reported on 10/14/2023) 30 tablet 1   PARoxetine  (PAXIL ) 10 MG tablet Take 1 tablet (10 mg total) by mouth daily. 90 tablet 1   prochlorperazine  (COMPAZINE ) 10 MG tablet Take 1 tablet (10 mg total) by mouth every 6 (six)  hours as needed for nausea or vomiting. (Patient not taking: Reported on 10/14/2023) 30 tablet 1   No current facility-administered medications for this visit.    REVIEW OF SYSTEMS:   Constitutional: Denies fevers, chills or abnormal night sweats Eyes: Denies blurriness of vision, double vision or watery eyes Ears, nose, mouth,  throat, and face: Denies mucositis or sore throat Respiratory: Denies cough, dyspnea or wheezes Cardiovascular: Denies palpitation, chest discomfort or lower extremity swelling Gastrointestinal:  Denies nausea, heartburn or change in bowel habits Skin: Denies abnormal skin rashes Lymphatics: Denies new lymphadenopathy or easy bruising Neurological:Denies numbness, tingling or new weaknesses Behavioral/Psych: Mood is stable, no new changes  Breast: Denies any palpable lumps or discharge All other systems were reviewed with the patient and are negative.  PHYSICAL EXAMINATION: ECOG PERFORMANCE STATUS: 0 - Asymptomatic  Vitals:   10/14/23 1344  BP: (!) 136/96  Pulse: (!) 102  Resp: 17  Temp: 98.2 F (36.8 C)  SpO2: 96%      Filed Weights   10/14/23 1344  Weight: 168 lb (76.2 kg)    Physical Exam Constitutional:      General: She is not in acute distress.    Appearance: Normal appearance. She is not ill-appearing.  Cardiovascular:     Rate and Rhythm: Normal rate and regular rhythm.  Pulmonary:     Effort: Pulmonary effort is normal.     Breath sounds: Normal breath sounds.  Abdominal:     General: There is no distension.  Musculoskeletal:        General: Normal range of motion.     Cervical back: Normal range of motion and neck supple. No rigidity.  Lymphadenopathy:     Cervical: No cervical adenopathy.  Skin:    General: Skin is warm and dry.  Neurological:     General: No focal deficit present.     Mental Status: She is alert and oriented to person, place, and time.      LABORATORY DATA:  I have reviewed the data as listed Lab  Results  Component Value Date   WBC 4.5 10/14/2023   HGB 11.8 (L) 10/14/2023   HCT 33.8 (L) 10/14/2023   MCV 96.6 10/14/2023   PLT 335 10/14/2023   Lab Results  Component Value Date   NA 140 10/14/2023   K 3.9 10/14/2023   CL 108 10/14/2023   CO2 27 10/14/2023    ASSESSMENT AND PLAN:  Brittany [name] is a 68 y.o. female who presents to the clinic for evaluation for right breast cancer.   #Malignant neoplasm of upper-outer quadrant of right breast in female, estrogen receptor positive: --Received adjuvant AC chemotherapy from 06/09/2023-07/21/2023 x 4 cycles --Started weekly Taxol  on 07/25/2023 PLAN --Due for Cycle 13 of weekly Taxol  today --Labs from today were reviewed and adequate for treatment. WBC 4.5, Hgb 11.8, Plt 335, Creatinine and LFTs are in range.  --Proceed with treatment without any dose modification --RTC in 1 week with labs/follow up prior to Cycle 14.   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 The patient knows to call the clinic with any problems, questions or concerns.    I have spent a total of 30 minutes minutes of face-to-face and non-face-to-face time, preparing to see the patient,  performing a medically appropriate examination, counseling and educating the patient, documenting clinical information in the electronic health record, independently interpreting results and communicating results to the patient, and care coordination.   Wyline Hearing PA-C Dept of Hematology and Oncology [facility] at [facility] Phone: [phone]

## 2023-10-20 ENCOUNTER — Encounter: Payer: Self-pay | Admitting: *Deleted

## 2023-10-21 ENCOUNTER — Inpatient Hospital Stay (HOSPITAL_BASED_OUTPATIENT_CLINIC_OR_DEPARTMENT_OTHER): Admitting: Adult Health

## 2023-10-21 ENCOUNTER — Inpatient Hospital Stay

## 2023-10-21 VITALS — BP 113/63 | HR 83 | Temp 97.8°F | Resp 18 | Ht 64.0 in | Wt 169.6 lb

## 2023-10-21 DIAGNOSIS — Z170 Estrogen receptor positive status [ER+]: Secondary | ICD-10-CM

## 2023-10-21 DIAGNOSIS — Z5111 Encounter for antineoplastic chemotherapy: Secondary | ICD-10-CM | POA: Diagnosis not present

## 2023-10-21 DIAGNOSIS — Z95828 Presence of other vascular implants and grafts: Secondary | ICD-10-CM

## 2023-10-21 DIAGNOSIS — C50411 Malignant neoplasm of upper-outer quadrant of right female breast: Secondary | ICD-10-CM | POA: Diagnosis not present

## 2023-10-21 LAB — CMP (CANCER CENTER ONLY)
ALT: 29 U/L (ref 0–44)
AST: 23 U/L (ref 15–41)
Albumin: 4.2 g/dL (ref 3.5–5.0)
Alkaline Phosphatase: 80 U/L (ref 38–126)
Anion gap: 5 (ref 5–15)
BUN: 10 mg/dL (ref 8–23)
CO2: 27 mmol/L (ref 22–32)
Calcium: 9 mg/dL (ref 8.9–10.3)
Chloride: 107 mmol/L (ref 98–111)
Creatinine: 0.77 mg/dL (ref 0.44–1.00)
GFR, Estimated: >60 mL/min (ref >60)
Glucose, Bld: 89 mg/dL (ref 70–99)
Potassium: 4.1 mmol/L (ref 3.5–5.1)
Sodium: 139 mmol/L (ref 135–145)
Total Bilirubin: 0.4 mg/dL (ref 0.0–1.2)
Total Protein: 6.5 g/dL (ref 6.5–8.1)

## 2023-10-21 LAB — CBC WITH DIFFERENTIAL (CANCER CENTER ONLY)
Abs Immature Granulocytes: 0.01 10*3/uL (ref 0.00–0.07)
Basophils Absolute: 0 10*3/uL (ref 0.0–0.1)
Basophils Relative: 1 %
Eosinophils Absolute: 0.1 10*3/uL (ref 0.0–0.5)
Eosinophils Relative: 4 %
HCT: 33.8 % — ABNORMAL LOW (ref 36.0–46.0)
Hemoglobin: 11.5 g/dL — ABNORMAL LOW (ref 12.0–15.0)
Immature Granulocytes: 0 %
Lymphocytes Relative: 38 %
Lymphs Abs: 1.5 10*3/uL (ref 0.7–4.0)
MCH: 33 pg (ref 26.0–34.0)
MCHC: 34 g/dL (ref 30.0–36.0)
MCV: 97.1 fL (ref 80.0–100.0)
Monocytes Absolute: 0.4 10*3/uL (ref 0.1–1.0)
Monocytes Relative: 11 %
Neutro Abs: 1.9 10*3/uL (ref 1.7–7.7)
Neutrophils Relative %: 46 %
Platelet Count: 330 10*3/uL (ref 150–400)
RBC: 3.48 MIL/uL — ABNORMAL LOW (ref 3.87–5.11)
RDW: 12.5 % (ref 11.5–15.5)
WBC Count: 4 10*3/uL (ref 4.0–10.5)
nRBC: 0 % (ref 0.0–0.2)

## 2023-10-21 MED ORDER — SODIUM CHLORIDE 0.9 % IV SOLN: INTRAVENOUS | Status: DC

## 2023-10-21 MED ORDER — DEXAMETHASONE SODIUM PHOSPHATE 10 MG/ML IJ SOLN
10.0000 mg | Freq: Once | INTRAMUSCULAR | Status: AC
  Administered 2023-10-21: 10 mg via INTRAVENOUS
  Filled 2023-10-21: qty 1

## 2023-10-21 MED ORDER — HEPARIN SOD (PORK) LOCK FLUSH 100 UNIT/ML IV SOLN
500.0000 [IU] | Freq: Once | INTRAVENOUS | Status: AC | PRN
  Administered 2023-10-21: 500 [IU]

## 2023-10-21 MED ORDER — FAMOTIDINE IN NACL 20-0.9 MG/50ML-% IV SOLN
20.0000 mg | Freq: Once | INTRAVENOUS | Status: AC
  Administered 2023-10-21: 20 mg via INTRAVENOUS
  Filled 2023-10-21: qty 50

## 2023-10-21 MED ORDER — SODIUM CHLORIDE 0.9 % IV SOLN
60.0000 mg/m2 | Freq: Once | INTRAVENOUS | Status: AC
  Administered 2023-10-21: 114 mg via INTRAVENOUS
  Filled 2023-10-21: qty 19

## 2023-10-21 MED ORDER — SODIUM CHLORIDE 0.9% FLUSH
10.0000 mL | INTRAVENOUS | Status: DC | PRN
  Administered 2023-10-21: 10 mL

## 2023-10-21 MED ORDER — SODIUM CHLORIDE 0.9% FLUSH
10.0000 mL | Freq: Once | INTRAVENOUS | Status: AC
  Administered 2023-10-21: 10 mL

## 2023-10-21 MED ORDER — DIPHENHYDRAMINE HCL 50 MG/ML IJ SOLN
50.0000 mg | Freq: Once | INTRAMUSCULAR | Status: AC
  Administered 2023-10-21: 50 mg via INTRAVENOUS
  Filled 2023-10-21: qty 1

## 2023-10-21 NOTE — Patient Instructions (Signed)

## 2023-10-21 NOTE — Progress Notes (Unsigned)
 [facility] Cancer Follow up:    Zorita Hiss, NP [address] [place] [address]   DIAGNOSIS: Cancer Staging  Malignant neoplasm of upper-outer quadrant of right breast in female, estrogen receptor positive (HCC) Staging form: Breast, AJCC 8th Edition - Pathologic stage from 05/31/2023: Stage IB (pT1c, pN2a, cM0, G2, ER+, PR+, HER2-) - Signed by Murleen Arms, MD on 05/31/2023 Stage prefix: Initial diagnosis Method of lymph node assessment: Axillary lymph node dissection Histologic grading system: 3 grade system    SUMMARY OF ONCOLOGIC HISTORY: Oncology History  Malignant neoplasm of upper-outer quadrant of right breast in female, estrogen receptor positive (HCC)  02/22/2023 Mammogram   Screening mammogram on February 22, 2023 showed possible mass in the right breast with calcifications warranting further evaluation.  No findings suspicious for malignancy in the left breast.  Diagnostic mammogram done confirmed a persistent irregular mass within the central right breast, adjacent to the mass are slightly heterogeneous calcifications.  Ultrasound showed 1.1 x 1.2 x 1.3 cm irregular hypoechoic mass at 10 o'clock position of the right breast 1 cm from the nipple, no abnormal right axillary lymph nodes are noted.   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  Prognostic showed ER 100% positive strong staining PR 95% positive strong staining Ki-67 of 5% and HER2 2+   03/14/2023 Initial Diagnosis   Malignant neoplasm of upper-outer quadrant of right breast in female, estrogen receptor positive (HCC)    Genetic Testing   [facility] CancerNext-Expanded Panel+RNA was Negative. Report date is 03/27/2023.   The CancerNext-Expanded gene panel offered by [facility] and includes sequencing, rearrangement, and RNA analysis for the  following 71 genes: AIP, ALK, APC, ATM, AXIN2, BAP1, BARD1, BMPR1A, BRCA1, BRCA2, BRIP1, CDC73, CDH1, CDK4, CDKN1B, CDKN2A, CHEK2, CTNNA1, DICER1, FH, FLCN, KIF1B, LZTR1, MAX, MEN1, MET, MLH1, MSH2, MSH3, MSH6, MUTYH, NF1, NF2, NTHL1, PALB2, PHOX2B, PMS2, POT1, PRKAR1A, PTCH1, PTEN, RAD51C, RAD51D, RB1, RET, SDHA, SDHAF2, SDHB, SDHC, SDHD, SMAD4, SMARCA4, SMARCB1, SMARCE1, STK11, SUFU, TMEM127, TP53, TSC1, TSC2, and VHL (sequencing and deletion/duplication); EGFR, EGLN1, HOXB13, KIT, MITF, PDGFRA, POLD1, and POLE (sequencing only); EPCAM and GREM1 (deletion/duplication only).    05/31/2023 Cancer Staging   Staging form: Breast, AJCC 8th Edition - Pathologic stage from 05/31/2023: Stage IB (pT1c, pN2a, cM0, G2, ER+, PR+, HER2-) - Signed by Murleen Arms, MD on 05/31/2023 Stage prefix: Initial diagnosis Method of lymph node assessment: Axillary lymph node dissection Histologic grading system: 3 grade system   06/09/2023 -  Chemotherapy   Patient is on Treatment Plan : BREAST DOSE DENSE AC q14d / PACLitaxel  q7d       CURRENT THERAPY:  INTERVAL HISTORY:  Discussed the use of AI scribe software for clinical note transcription with the patient, who gave verbal consent to proceed.  Alfreda E Stakes is a 68 year old female undergoing chemotherapy who presents for follow-up.  She is on weekly Taxol  chemotherapy, reduced to 60 mg/m due to intermittent neuropathy, and is in her tenth week of treatment. She experiences numbness and tingling in her toes and fingers, with improvement noted today. The neuropathy is more pronounced in her right foot, especially one toe with prior issues. Tingling in her other foot and hands has resolved.  Her white blood cells are 4, hemoglobin is 11.5, and platelets are 330. The ANC has decreased to 1.9 from 3.0  the previous week, and she receives neupogen  biosimilar to help prevent treatment related neutropenia.    She uses Tiger Balm for neuropathy relief, which is  effective. She denies shortness of breath, chest pain, or palpitations but has an intermittent dry cough.   Patient Active Problem List   Diagnosis Date Noted   Port-A-Cath in place 05/31/2023   Genetic testing 03/28/2023   Family history of breast cancer 03/16/2023   Malignant neoplasm of upper-outer quadrant of right breast in female, estrogen receptor positive (HCC) 03/14/2023   Arthralgia of right knee 01/28/2023   Pneumococcal vaccination administered at current visit 01/28/2023   Decreased GFR 12/25/2021   Encounter for general adult medical examination with abnormal findings 07/23/2021   Overweight (BMI 25.0-29.9) 07/23/2021   Hyperlipidemia 07/23/2021   Prediabetes 07/23/2021   Ceruminosis, bilateral 02/18/2021   Vitamin D  deficiency 04/01/2020   Elevated LDL cholesterol level    Anxiety and depression 11/14/2017   Abnormal mammogram with microcalcification 11/14/2017   Thyroid  disorder 11/14/2017    is allergic to wellbutrin [bupropion].  MEDICAL HISTORY: Past Medical History:  Diagnosis Date   Anxiety and depression    Elevated LDL cholesterol level    Elevated liver enzymes 07/23/2021   GERD (gastroesophageal reflux disease)    Malignant neoplasm of breast (female) (HCC)    Thyroid  disorder 11/14/2017    SURGICAL HISTORY: Past Surgical History:  Procedure Laterality Date   AXILLARY LYMPH NODE DISSECTION Right 05/18/2023   Procedure: RIGHT AXILLARY LYMPH NODE DISSECTION;  Surgeon: Lockie Rima, MD;  Location: [facility] OR;  Service: General;  Laterality: Right;  PEC BLOCK   BREAST BIOPSY Right 03/09/2023   US  RT BREAST BX W LOC DEV 1ST LESION IMG BX SPEC US  GUIDE 03/09/2023 [facility] MAMMOGRAPHY   BREAST BIOPSY  04/12/2023   US  RT RADIOACTIVE SEED LOC 04/12/2023 [facility] ULTRASOUND   BREAST LUMPECTOMY WITH RADIOACTIVE SEED AND SENTINEL LYMPH NODE BIOPSY Right 04/13/2023   Procedure: RIGHT BREAST SEED LOCALIZED LUMPECTOMY WITH SENTINEL NODE BIOPSY;  Surgeon: Lockie Rima, MD;   Location: [facility];  Service: General;  Laterality: Right;   HERNIA REPAIR     LAPAROSCOPIC HYSTERECTOMY  03/2017   Partial, pt still has both adnexa   PORTACATH PLACEMENT N/A 05/18/2023   Procedure: PORT PLACEMENT WITH ULTRASOUND GUIDANCE;  Surgeon: Lockie Rima, MD;  Location: [facility] OR;  Service: General;  Laterality: N/A;    SOCIAL HISTORY: Social History   Socioeconomic History   Marital status: Divorced    Spouse name: Not on file   Number of children: Not on file   Years of education: Not on file   Highest education level: Master's degree (e.g., MA, MS, MEng, MEd, MSW, MBA)  Occupational History   Not on file  Tobacco Use   Smoking status: Former   Smokeless tobacco: Never   Tobacco comments:    stopped at age 43. smoked for 4-5 years 1 ppd  Vaping Use   Vaping status: Never Used  Substance and Sexual Activity   Alcohol use: Yes    Comment: WINE OCC   Drug use: Never   Sexual activity: Not Currently    Partners: Male    Birth control/protection: Surgical    Comment: hysterectomy  Other Topics Concern   Not on file  Social History Narrative   Not on file   Social Drivers of Health   Financial Resource Strain: Low Risk  (06/20/2023)   Overall Financial Resource Strain (CARDIA)    Difficulty of Paying  Living Expenses: Not very hard  Food Insecurity: No Food Insecurity (06/20/2023)   Hunger Vital Sign    Worried About Running Out of Food in the Last Year: Never true    Ran Out of Food in the Last Year: Never true  Transportation Needs: No Transportation Needs (06/20/2023)   PRAPARE - Administrator, Civil Service (Medical): No    Lack of Transportation (Non-Medical): No  Physical Activity: Insufficiently Active (06/20/2023)   Exercise Vital Sign    Days of Exercise per Week: 3 days    Minutes of Exercise per Session: 30 min  Stress: Stress Concern Present (06/20/2023)   [employer] of Occupational Health - Occupational Stress  Questionnaire    Feeling of Stress : To some extent  Social Connections: Unknown (06/20/2023)   Social Connection and Isolation Panel    Frequency of Communication with Friends and Family: More than three times a week    Frequency of Social Gatherings with Friends and Family: Once a week    Attends Religious Services: Patient declined    Database administrator or Organizations: No    Attends Engineer, structural: Not on file    Marital Status: Divorced  Intimate Partner Violence: Not At Risk (03/16/2023)   Humiliation, Afraid, Rape, and Kick questionnaire    Fear of Current or Ex-Partner: No    Emotionally Abused: No    Physically Abused: No    Sexually Abused: No    FAMILY HISTORY: Family History  Problem Relation Age of Onset   Breast cancer Mother 20   Diabetes Mother    Hypertension Mother    Cancer Father 26 - 11       liver?   Diabetes Brother    Cancer Paternal Uncle    Breast cancer Paternal Grandmother 21    Review of Systems  Constitutional:  Positive for fatigue. Negative for appetite change, chills, fever and unexpected weight change.  HENT:   Negative for hearing loss, lump/mass and trouble swallowing.   Eyes:  Negative for eye problems and icterus.  Respiratory:  Negative for chest tightness, cough and shortness of breath.   Cardiovascular:  Negative for chest pain, leg swelling and palpitations.  Gastrointestinal:  Negative for abdominal distention, abdominal pain, constipation, diarrhea, nausea and vomiting.  Endocrine: Negative for hot flashes.  Genitourinary:  Negative for difficulty urinating.   Musculoskeletal:  Negative for arthralgias.  Skin:  Negative for itching and rash.  Neurological:  Positive for numbness. Negative for dizziness, extremity weakness and headaches.  Hematological:  Negative for adenopathy. Does not bruise/bleed easily.  Psychiatric/Behavioral:  Negative for depression. The patient is not nervous/anxious.       PHYSICAL  EXAMINATION    Vitals:   10/21/23 1106  BP: 113/63  Pulse: 83  Resp: 18  Temp: 97.8 F (36.6 C)  SpO2: 97%    Physical Exam Constitutional:      General: She is not in acute distress.    Appearance: Normal appearance. She is not toxic-appearing.  HENT:     Head: Normocephalic and atraumatic.     Mouth/Throat:     Mouth: Mucous membranes are moist.     Pharynx: Oropharynx is clear. No oropharyngeal exudate or posterior oropharyngeal erythema.   Eyes:     General: No scleral icterus.   Cardiovascular:     Rate and Rhythm: Normal rate and regular rhythm.     Pulses: Normal pulses.     Heart sounds: Normal heart  sounds.  Pulmonary:     Effort: Pulmonary effort is normal.     Breath sounds: Normal breath sounds.  Abdominal:     General: Abdomen is flat. Bowel sounds are normal. There is no distension.     Palpations: Abdomen is soft.     Tenderness: There is no abdominal tenderness.   Musculoskeletal:        General: No swelling.     Cervical back: Neck supple.  Lymphadenopathy:     Cervical: No cervical adenopathy.   Skin:    General: Skin is warm and dry.     Findings: No rash.   Neurological:     General: No focal deficit present.     Mental Status: She is alert.   Psychiatric:        Mood and Affect: Mood normal.        Behavior: Behavior normal.     LABORATORY DATA:  CBC    Component Value Date/Time   WBC 4.0 10/21/2023 1044   WBC 7.5 05/13/2023 1040   RBC 3.48 (L) 10/21/2023 1044   HGB 11.5 (L) 10/21/2023 1044   HGB 13.4 04/01/2020 0912   HCT 33.8 (L) 10/21/2023 1044   HCT 39.0 04/01/2020 0912   PLT 330 10/21/2023 1044   PLT 331 04/01/2020 0912   MCV 97.1 10/21/2023 1044   MCV 93 04/01/2020 0912   MCH 33.0 10/21/2023 1044   MCHC 34.0 10/21/2023 1044   RDW 12.5 10/21/2023 1044   RDW 12.6 04/01/2020 0912   LYMPHSABS 1.5 10/21/2023 1044   LYMPHSABS 2.8 04/01/2020 0912   MONOABS 0.4 10/21/2023 1044   EOSABS 0.1 10/21/2023 1044   EOSABS  0.1 04/01/2020 0912   BASOSABS 0.0 10/21/2023 1044   BASOSABS 0.0 04/01/2020 0912    CMP     Component Value Date/Time   NA 139 10/21/2023 1044   NA 140 04/01/2020 0912   K 4.1 10/21/2023 1044   CL 107 10/21/2023 1044   CO2 27 10/21/2023 1044   GLUCOSE 89 10/21/2023 1044   BUN 10 10/21/2023 1044   BUN 10 04/01/2020 0912   CREATININE 0.77 10/21/2023 1044   CALCIUM 9.0 10/21/2023 1044   PROT 6.5 10/21/2023 1044   PROT 6.6 04/01/2020 0912   ALBUMIN 4.2 10/21/2023 1044   ALBUMIN 4.3 04/01/2020 0912   AST 23 10/21/2023 1044   ALT 29 10/21/2023 1044   ALKPHOS 80 10/21/2023 1044   BILITOT 0.4 10/21/2023 1044   GFRNONAA >60 10/21/2023 1044   GFRAA 71 04/01/2020 0912     ASSESSMENT and THERAPY PLAN:    Breast cancer undergoing chemotherapy Currently on week 10 of weekly Taxol  chemotherapy at a reduced dose of 60 mg/m due to neuropathy. Blood work shows adequate ANC of 1.9 and normal white blood cells, hemoglobin, and platelets. No electrolyte, kidney, or liver function issues. - Continue weekly Taxol  chemotherapy at 60 mg/m.  Chemotherapy-induced peripheral neuropathy Intermittent neuropathy in toes and fingers with numbness and tingling, improved today. Likely aggravated by chemotherapy. Uses Tiger Balm for relief. - Monitor neuropathy symptoms, noting duration, severity, and impact on fine motor skills.   All questions were answered. The patient knows to call the clinic with any problems, questions or concerns. We can certainly see the patient much sooner if necessary.  Total encounter time:20 minutes*in face-to-face visit time, chart review, lab review, care coordination, order entry, and documentation of the encounter time.    Alwin Baars, NP 10/21/23 11:45 AM Medical Oncology and Hematology [facility] [address] [place], [place] [address] Tel. [phone]    Fax. [phone]  *Total Encounter Time as defined by the Centers for Medicare and  Medicaid Services includes, in addition to the face-to-face time of a patient visit (documented in the note above) non-face-to-face time: obtaining and reviewing outside history, ordering and reviewing medications, tests or procedures, care coordination (communications with other health care professionals or caregivers) and documentation in the medical record.

## 2023-10-24 ENCOUNTER — Encounter: Payer: Self-pay | Admitting: Adult Health

## 2023-10-24 ENCOUNTER — Encounter: Payer: Self-pay | Admitting: Hematology and Oncology

## 2023-10-27 ENCOUNTER — Telehealth: Payer: Self-pay

## 2023-10-27 NOTE — Telephone Encounter (Signed)
 Verbally confirmed appt for 6/20

## 2023-10-28 ENCOUNTER — Inpatient Hospital Stay

## 2023-10-28 ENCOUNTER — Inpatient Hospital Stay: Admitting: Hematology and Oncology

## 2023-10-28 ENCOUNTER — Other Ambulatory Visit: Payer: Self-pay | Admitting: General Surgery

## 2023-10-28 VITALS — BP 124/81 | HR 80 | Temp 98.0°F | Resp 16 | Wt 169.1 lb

## 2023-10-28 DIAGNOSIS — C50411 Malignant neoplasm of upper-outer quadrant of right female breast: Secondary | ICD-10-CM | POA: Diagnosis not present

## 2023-10-28 DIAGNOSIS — Z170 Estrogen receptor positive status [ER+]: Secondary | ICD-10-CM

## 2023-10-28 DIAGNOSIS — Z95828 Presence of other vascular implants and grafts: Secondary | ICD-10-CM

## 2023-10-28 DIAGNOSIS — Z5111 Encounter for antineoplastic chemotherapy: Secondary | ICD-10-CM | POA: Diagnosis not present

## 2023-10-28 LAB — CBC WITH DIFFERENTIAL (CANCER CENTER ONLY)
Abs Immature Granulocytes: 0.01 10*3/uL (ref 0.00–0.07)
Basophils Absolute: 0 10*3/uL (ref 0.0–0.1)
Basophils Relative: 1 %
Eosinophils Absolute: 0.1 10*3/uL (ref 0.0–0.5)
Eosinophils Relative: 2 %
HCT: 35.1 % — ABNORMAL LOW (ref 36.0–46.0)
Hemoglobin: 12.1 g/dL (ref 12.0–15.0)
Immature Granulocytes: 0 %
Lymphocytes Relative: 40 %
Lymphs Abs: 1.7 10*3/uL (ref 0.7–4.0)
MCH: 33.2 pg (ref 26.0–34.0)
MCHC: 34.5 g/dL (ref 30.0–36.0)
MCV: 96.2 fL (ref 80.0–100.0)
Monocytes Absolute: 0.5 10*3/uL (ref 0.1–1.0)
Monocytes Relative: 12 %
Neutro Abs: 1.9 10*3/uL (ref 1.7–7.7)
Neutrophils Relative %: 45 %
Platelet Count: 325 10*3/uL (ref 150–400)
RBC: 3.65 MIL/uL — ABNORMAL LOW (ref 3.87–5.11)
RDW: 12.6 % (ref 11.5–15.5)
WBC Count: 4.3 10*3/uL (ref 4.0–10.5)
nRBC: 0 % (ref 0.0–0.2)

## 2023-10-28 LAB — CMP (CANCER CENTER ONLY)
ALT: 35 U/L (ref 0–44)
AST: 23 U/L (ref 15–41)
Albumin: 4.3 g/dL (ref 3.5–5.0)
Alkaline Phosphatase: 84 U/L (ref 38–126)
Anion gap: 5 (ref 5–15)
BUN: 13 mg/dL (ref 8–23)
CO2: 26 mmol/L (ref 22–32)
Calcium: 9.2 mg/dL (ref 8.9–10.3)
Chloride: 108 mmol/L (ref 98–111)
Creatinine: 0.79 mg/dL (ref 0.44–1.00)
GFR, Estimated: >60 mL/min (ref >60)
Glucose, Bld: 89 mg/dL (ref 70–99)
Potassium: 4.1 mmol/L (ref 3.5–5.1)
Sodium: 139 mmol/L (ref 135–145)
Total Bilirubin: 0.4 mg/dL (ref 0.0–1.2)
Total Protein: 6.6 g/dL (ref 6.5–8.1)

## 2023-10-28 MED ORDER — SODIUM CHLORIDE 0.9 % IV SOLN: INTRAVENOUS | Status: DC

## 2023-10-28 MED ORDER — SODIUM CHLORIDE 0.9 % IV SOLN
60.0000 mg/m2 | Freq: Once | INTRAVENOUS | Status: AC
  Administered 2023-10-28: 114 mg via INTRAVENOUS
  Filled 2023-10-28: qty 19

## 2023-10-28 MED ORDER — SODIUM CHLORIDE 0.9% FLUSH
10.0000 mL | Freq: Once | INTRAVENOUS | Status: AC
  Administered 2023-10-28: 10 mL

## 2023-10-28 MED ORDER — FAMOTIDINE IN NACL 20-0.9 MG/50ML-% IV SOLN
20.0000 mg | Freq: Once | INTRAVENOUS | Status: AC
  Administered 2023-10-28: 20 mg via INTRAVENOUS
  Filled 2023-10-28: qty 50

## 2023-10-28 MED ORDER — DEXAMETHASONE SODIUM PHOSPHATE 10 MG/ML IJ SOLN
10.0000 mg | Freq: Once | INTRAMUSCULAR | Status: DC
  Filled 2023-10-28: qty 1

## 2023-10-28 MED ORDER — HEPARIN SOD (PORK) LOCK FLUSH 100 UNIT/ML IV SOLN
500.0000 [IU] | Freq: Once | INTRAVENOUS | Status: AC | PRN
  Administered 2023-10-28: 500 [IU]

## 2023-10-28 MED ORDER — SODIUM CHLORIDE 0.9% FLUSH
10.0000 mL | INTRAVENOUS | Status: DC | PRN
  Administered 2023-10-28: 10 mL

## 2023-10-28 MED ORDER — DIPHENHYDRAMINE HCL 50 MG/ML IJ SOLN
50.0000 mg | Freq: Once | INTRAMUSCULAR | Status: AC
  Administered 2023-10-28: 50 mg via INTRAVENOUS
  Filled 2023-10-28: qty 1

## 2023-10-28 NOTE — Progress Notes (Signed)
 [facility] Cancer Follow up:    Brittany Hiss, NP [address] [place] [address]   DIAGNOSIS:  Cancer Staging  Malignant neoplasm of upper-outer quadrant of right breast in female, estrogen receptor positive (HCC) Staging form: Breast, AJCC 8th Edition - Pathologic stage from 05/31/2023: Stage IB (pT1c, pN2a, cM0, G2, ER+, PR+, HER2-) - Signed by Murleen Arms, MD on 05/31/2023 Stage prefix: Initial diagnosis Method of lymph node assessment: Axillary lymph node dissection Histologic grading system: 3 grade system    SUMMARY OF ONCOLOGIC HISTORY: Oncology History  Malignant neoplasm of upper-outer quadrant of right breast in female, estrogen receptor positive (HCC)  02/22/2023 Mammogram   Screening mammogram on February 22, 2023 showed possible mass in the right breast with calcifications warranting further evaluation.  No findings suspicious for malignancy in the left breast.  Diagnostic mammogram done confirmed a persistent irregular mass within the central right breast, adjacent to the mass are slightly heterogeneous calcifications.  Ultrasound showed 1.1 x 1.2 x 1.3 cm irregular hypoechoic mass at 10 o'clock position of the right breast 1 cm from the nipple, no abnormal right axillary lymph nodes are noted.   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  Prognostic showed ER 100% positive strong staining PR 95% positive strong staining Ki-67 of 5% and HER2 2+   03/14/2023 Initial Diagnosis   Malignant neoplasm of upper-outer quadrant of right breast in female, estrogen receptor positive (HCC)    Genetic Testing   [facility] CancerNext-Expanded Panel+RNA was Negative. Report date is 03/27/2023.   The CancerNext-Expanded gene panel offered by [facility] and includes sequencing, rearrangement, and RNA analysis for the  following 71 genes: AIP, ALK, APC, ATM, AXIN2, BAP1, BARD1, BMPR1A, BRCA1, BRCA2, BRIP1, CDC73, CDH1, CDK4, CDKN1B, CDKN2A, CHEK2, CTNNA1, DICER1, FH, FLCN, KIF1B, LZTR1, MAX, MEN1, MET, MLH1, MSH2, MSH3, MSH6, MUTYH, NF1, NF2, NTHL1, PALB2, PHOX2B, PMS2, POT1, PRKAR1A, PTCH1, PTEN, RAD51C, RAD51D, RB1, RET, SDHA, SDHAF2, SDHB, SDHC, SDHD, SMAD4, SMARCA4, SMARCB1, SMARCE1, STK11, SUFU, TMEM127, TP53, TSC1, TSC2, and VHL (sequencing and deletion/duplication); EGFR, EGLN1, HOXB13, KIT, MITF, PDGFRA, POLD1, and POLE (sequencing only); EPCAM and GREM1 (deletion/duplication only).    05/31/2023 Cancer Staging   Staging form: Breast, AJCC 8th Edition - Pathologic stage from 05/31/2023: Stage IB (pT1c, pN2a, cM0, G2, ER+, PR+, HER2-) - Signed by Murleen Arms, MD on 05/31/2023 Stage prefix: Initial diagnosis Method of lymph node assessment: Axillary lymph node dissection Histologic grading system: 3 grade system   06/09/2023 -  Chemotherapy   Patient is on Treatment Plan : BREAST DOSE DENSE AC q14d / PACLitaxel  q7d       CURRENT THERAPY: paclitaxel .  INTERVAL HISTORY:   Brittany [name] is a 68 year old female undergoing chemotherapy who presents for follow-up.  She is on weekly Taxol  chemotherapy, reduced to 60 mg/m due to intermittent neuropathy, and is on her 11 th week of treatment.  Discussed the use of AI scribe software for clinical note transcription with the patient, who gave verbal consent to proceed.  History of Present Illness Brittany [name] is a 68 year old female who presents for follow-up regarding her cancer treatment progress.  She experiences neuropathy with tingling sensations alternating between her feet and occasionally affecting her fingers. The tingling is intermittent and does not result in numbness.  She feels sluggish and tired, with  occasional bursts of energy. No significant discomfort or other bothersome symptoms are reported.  She has mild itchiness all over  her body, which she associates with her treatment. The itchiness is not intense and does not present with a rash.  Her bowel movements are regular without constipation, which was previously a concern.  Her port is functioning well and is located on her side. She has a dry mouth but denies any mouth sores.   Rest of the pertinent 10 point ROS reviewed and neg.  Patient Active Problem List   Diagnosis Date Noted   Port-A-Cath in place 05/31/2023   Genetic testing 03/28/2023   Family history of breast cancer 03/16/2023   Malignant neoplasm of upper-outer quadrant of right breast in female, estrogen receptor positive (HCC) 03/14/2023   Arthralgia of right knee 01/28/2023   Pneumococcal vaccination administered at current visit 01/28/2023   Decreased GFR 12/25/2021   Encounter for general adult medical examination with abnormal findings 07/23/2021   Overweight (BMI 25.0-29.9) 07/23/2021   Hyperlipidemia 07/23/2021   Prediabetes 07/23/2021   Ceruminosis, bilateral 02/18/2021   Vitamin D  deficiency 04/01/2020   Elevated LDL cholesterol level    Anxiety and depression 11/14/2017   Abnormal mammogram with microcalcification 11/14/2017   Thyroid  disorder 11/14/2017    is allergic to wellbutrin [bupropion].  MEDICAL HISTORY: Past Medical History:  Diagnosis Date   Anxiety and depression    Elevated LDL cholesterol level    Elevated liver enzymes 07/23/2021   GERD (gastroesophageal reflux disease)    Malignant neoplasm of breast (female) (HCC)    Thyroid  disorder 11/14/2017    SURGICAL HISTORY: Past Surgical History:  Procedure Laterality Date   AXILLARY LYMPH NODE DISSECTION Right 05/18/2023   Procedure: RIGHT AXILLARY LYMPH NODE DISSECTION;  Surgeon: Lockie Rima, MD;  Location: [facility] OR;  Service: General;  Laterality: Right;  PEC BLOCK   BREAST BIOPSY Right 03/09/2023   US  RT BREAST BX W LOC DEV 1ST LESION IMG BX SPEC US  GUIDE 03/09/2023 [facility] MAMMOGRAPHY   BREAST BIOPSY   04/12/2023   US  RT RADIOACTIVE SEED LOC 04/12/2023 [facility] ULTRASOUND   BREAST LUMPECTOMY WITH RADIOACTIVE SEED AND SENTINEL LYMPH NODE BIOPSY Right 04/13/2023   Procedure: RIGHT BREAST SEED LOCALIZED LUMPECTOMY WITH SENTINEL NODE BIOPSY;  Surgeon: Lockie Rima, MD;  Location: [facility];  Service: General;  Laterality: Right;   HERNIA REPAIR     LAPAROSCOPIC HYSTERECTOMY  03/2017   Partial, pt still has both adnexa   PORTACATH PLACEMENT N/A 05/18/2023   Procedure: PORT PLACEMENT WITH ULTRASOUND GUIDANCE;  Surgeon: Lockie Rima, MD;  Location: [facility] OR;  Service: General;  Laterality: N/A;    SOCIAL HISTORY: Social History   Socioeconomic History   Marital status: Divorced    Spouse name: Not on file   Number of children: Not on file   Years of education: Not on file   Highest education level: Master's degree (e.g., MA, MS, MEng, MEd, MSW, MBA)  Occupational History   Not on file  Tobacco Use   Smoking status: Former   Smokeless tobacco: Never   Tobacco comments:    stopped at age 39. smoked for 4-5 years 1 ppd  Vaping Use   Vaping status: Never Used  Substance and Sexual Activity   Alcohol use: Yes    Comment: WINE OCC   Drug use: Never   Sexual activity: Not Currently    Partners: Male    Birth control/protection: Surgical    Comment: hysterectomy  Other  Topics Concern   Not on file  Social History Narrative   Not on file   Social Drivers of Health   Financial Resource Strain: Low Risk  (06/20/2023)   Overall Financial Resource Strain (CARDIA)    Difficulty of Paying Living Expenses: Not very hard  Food Insecurity: No Food Insecurity (06/20/2023)   Hunger Vital Sign    Worried About Running Out of Food in the Last Year: Never true    Ran Out of Food in the Last Year: Never true  Transportation Needs: No Transportation Needs (06/20/2023)   PRAPARE - Administrator, Civil Service (Medical): No    Lack of Transportation (Non-Medical): No  Physical  Activity: Insufficiently Active (06/20/2023)   Exercise Vital Sign    Days of Exercise per Week: 3 days    Minutes of Exercise per Session: 30 min  Stress: Stress Concern Present (06/20/2023)   [employer] of Occupational Health - Occupational Stress Questionnaire    Feeling of Stress : To some extent  Social Connections: Unknown (06/20/2023)   Social Connection and Isolation Panel    Frequency of Communication with Friends and Family: More than three times a week    Frequency of Social Gatherings with Friends and Family: Once a week    Attends Religious Services: Patient declined    Database administrator or Organizations: No    Attends Engineer, structural: Not on file    Marital Status: Divorced  Intimate Partner Violence: Not At Risk (03/16/2023)   Humiliation, Afraid, Rape, and Kick questionnaire    Fear of Current or Ex-Partner: No    Emotionally Abused: No    Physically Abused: No    Sexually Abused: No    FAMILY HISTORY: Family History  Problem Relation Age of Onset   Breast cancer Mother 2   Diabetes Mother    Hypertension Mother    Cancer Father 61 - 82       liver?   Diabetes Brother    Cancer Paternal Uncle    Breast cancer Paternal Grandmother 48    Review of Systems  Constitutional:  Positive for fatigue. Negative for appetite change, chills, fever and unexpected weight change.  HENT:   Negative for hearing loss, lump/mass and trouble swallowing.   Eyes:  Negative for eye problems and icterus.  Respiratory:  Negative for chest tightness, cough and shortness of breath.   Cardiovascular:  Negative for chest pain, leg swelling and palpitations.  Gastrointestinal:  Negative for abdominal distention, abdominal pain, constipation, diarrhea, nausea and vomiting.  Endocrine: Negative for hot flashes.  Genitourinary:  Negative for difficulty urinating.   Musculoskeletal:  Negative for arthralgias.  Skin:  Negative for itching and rash.  Neurological:   Positive for numbness. Negative for dizziness, extremity weakness and headaches.  Hematological:  Negative for adenopathy. Does not bruise/bleed easily.  Psychiatric/Behavioral:  Negative for depression. The patient is not nervous/anxious.       PHYSICAL EXAMINATION    Vitals:   10/28/23 1305  BP: 124/81  Pulse: 80  Resp: 16  Temp: 98 F (36.7 C)  SpO2: 98%    Physical Exam Constitutional:      General: She is not in acute distress.    Appearance: Normal appearance. She is not toxic-appearing.  HENT:     Head: Normocephalic and atraumatic.     Mouth/Throat:     Mouth: Mucous membranes are moist.     Pharynx: Oropharynx is clear. No oropharyngeal exudate or  posterior oropharyngeal erythema.   Eyes:     General: No scleral icterus.   Cardiovascular:     Rate and Rhythm: Normal rate and regular rhythm.     Pulses: Normal pulses.     Heart sounds: Normal heart sounds.  Pulmonary:     Effort: Pulmonary effort is normal.     Breath sounds: Normal breath sounds.  Abdominal:     General: Abdomen is flat. Bowel sounds are normal. There is no distension.     Palpations: Abdomen is soft.     Tenderness: There is no abdominal tenderness.   Musculoskeletal:        General: No swelling.     Cervical back: Neck supple.  Lymphadenopathy:     Cervical: No cervical adenopathy.   Skin:    General: Skin is warm and dry.     Findings: No rash.   Neurological:     General: No focal deficit present.     Mental Status: She is alert.   Psychiatric:        Mood and Affect: Mood normal.        Behavior: Behavior normal.     LABORATORY DATA:  CBC    Component Value Date/Time   WBC 4.3 10/28/2023 1250   WBC 7.5 05/13/2023 1040   RBC 3.65 (L) 10/28/2023 1250   HGB 12.1 10/28/2023 1250   HGB 13.4 04/01/2020 0912   HCT 35.1 (L) 10/28/2023 1250   HCT 39.0 04/01/2020 0912   PLT 325 10/28/2023 1250   PLT 331 04/01/2020 0912   MCV 96.2 10/28/2023 1250   MCV 93 04/01/2020  0912   MCH 33.2 10/28/2023 1250   MCHC 34.5 10/28/2023 1250   RDW 12.6 10/28/2023 1250   RDW 12.6 04/01/2020 0912   LYMPHSABS 1.7 10/28/2023 1250   LYMPHSABS 2.8 04/01/2020 0912   MONOABS 0.5 10/28/2023 1250   EOSABS 0.1 10/28/2023 1250   EOSABS 0.1 04/01/2020 0912   BASOSABS 0.0 10/28/2023 1250   BASOSABS 0.0 04/01/2020 0912    CMP     Component Value Date/Time   NA 139 10/21/2023 1044   NA 140 04/01/2020 0912   K 4.1 10/21/2023 1044   CL 107 10/21/2023 1044   CO2 27 10/21/2023 1044   GLUCOSE 89 10/21/2023 1044   BUN 10 10/21/2023 1044   BUN 10 04/01/2020 0912   CREATININE 0.77 10/21/2023 1044   CALCIUM 9.0 10/21/2023 1044   PROT 6.5 10/21/2023 1044   PROT 6.6 04/01/2020 0912   ALBUMIN 4.2 10/21/2023 1044   ALBUMIN 4.3 04/01/2020 0912   AST 23 10/21/2023 1044   ALT 29 10/21/2023 1044   ALKPHOS 80 10/21/2023 1044   BILITOT 0.4 10/21/2023 1044   GFRNONAA >60 10/21/2023 1044   GFRAA 71 04/01/2020 0912     ASSESSMENT and THERAPY PLAN:    Breast cancer undergoing chemotherapy Currently on week 11 of weekly Taxol  chemotherapy at a reduced dose of 60 mg/m due to neuropathy. Blood work shows adequate ANC of 1.9 and normal white blood cells, hemoglobin, and platelets. No electrolyte, kidney, or liver function issues. - Continue weekly Taxol  chemotherapy at 60 mg/m.  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 The patient knows to call the clinic with any problems, questions or concerns. We can certainly see the patient much sooner if necessary.  Total encounter time:20  minutes*in face-to-face visit time, chart review, lab review, care coordination, order entry, and documentation of the encounter time.   *Total Encounter Time as defined by the Centers for Medicare and  Medicaid Services includes, in addition to the face-to-face time of a patient visit (documented in the note above) non-face-to-face time: obtaining and reviewing outside history, ordering and reviewing medications, tests or procedures, care coordination (communications with other health care professionals or caregivers) and documentation in the medical record.

## 2023-10-28 NOTE — Patient Instructions (Signed)

## 2023-10-31 ENCOUNTER — Encounter: Payer: Self-pay | Admitting: Hematology and Oncology

## 2023-10-31 NOTE — Telephone Encounter (Signed)
 Called to confirm appt.

## 2023-11-01 ENCOUNTER — Other Ambulatory Visit: Payer: Self-pay | Admitting: *Deleted

## 2023-11-01 DIAGNOSIS — Z170 Estrogen receptor positive status [ER+]: Secondary | ICD-10-CM

## 2023-11-01 NOTE — Progress Notes (Signed)
 Location of Breast Cancer: Right breast  Histology per Pathology Report:  05/18/2023 FINAL MICROSCOPIC DIAGNOSIS:  A. RIGHT AXILLARY CONTENTS, EXCISION:  Five of 13 axillary lymph nodes with metastatic lobular carcinoma (5/13)  Changes consistent with prior procedure   04/13/2023 FINAL MICROSCOPIC DIAGNOSIS:  A. BREAST, RIGHT, LUMPECTOMY:  Invasive lobular carcinoma, 1.9 x 1.1 x 1.0 cm, grade II/III  Ductal carcinoma in situ: Not identified (lobular carcinoma in situ  present)  Margins, invasive: <1 mm anterior      Closest, invasive: Anterior; 2 mm anterior (refer to part B)  Margins, DCIS: No DCIS present LCIS negative      Closest, DCIS: Anterior/less than 1 mm; medial 8 mm (see part D)  Lymphovascular invasion: Not identified  Prognostic markers:  ER positive, PR positive, Her2 negative, Ki-67 5%  Other: N/A   Receptor Status: ER(positive), PR (positive), Her2-neu (negative), Ki-(67 of 5%) Stage IB  Did patient present with symptoms (if so, please note symptoms) or was this found on screening mammography?: Screening mammogram on February 22, 2023 showed possible mass in the right breast with calcifications warranting further evaluation.   Screening Mammogram 03/08/2023 IMPRESSION: 1.3 cm highly suspicious mass within the UPPER OUTER/central RIGHT breast. Tissue sampling is recommended. No abnormal appearing RIGHT axillary lymph nodes.  Past/Anticipated interventions by surgeon, if any: Right Breast Dr. Aron   -04/13/2023 RIGHT BREAST SEED LOCALIZED LUMPECTOMY WITH SENTINEL NODE BIOPSY   -05/18/2023 RIGHT AXILLARY LYMPH NODE DISSECTION   Past/Anticipated interventions by medical oncology, if any: Dr. Loretha Chemotherapy- Yes- Paclitaxel    Lymphedema issues, if any:  Yes- Right breast  Pain issues, if any:  no   Skin issues if any   SAFETY ISSUES: Prior radiation? No Pacemaker/ICD? No Possible current pregnancy? No- Hysterectomy  Is the patient on methotrexate?  No  Current Complaints / other details:  None  Vitals: BP (!) 145/93 (BP Location: Left Arm, Patient Position: Sitting, Cuff Size: Large)   Pulse 85   Temp 97.8 F (36.6 C)   Resp 18   Ht 5' 4 (1.626 m)   Wt 172 lb 12.8 oz (78.4 kg)   SpO2 95%   BMI 29.66 kg/m     This concludes the interaction.  Rosaline Minerva, LPN

## 2023-11-04 ENCOUNTER — Inpatient Hospital Stay

## 2023-11-04 VITALS — BP 127/86 | HR 91 | Temp 97.8°F | Resp 18 | Ht 64.0 in | Wt 171.2 lb

## 2023-11-04 DIAGNOSIS — Z95828 Presence of other vascular implants and grafts: Secondary | ICD-10-CM

## 2023-11-04 DIAGNOSIS — C50411 Malignant neoplasm of upper-outer quadrant of right female breast: Secondary | ICD-10-CM

## 2023-11-04 DIAGNOSIS — Z5111 Encounter for antineoplastic chemotherapy: Secondary | ICD-10-CM | POA: Diagnosis not present

## 2023-11-04 LAB — CBC WITH DIFFERENTIAL (CANCER CENTER ONLY)
Abs Immature Granulocytes: 0.01 10*3/uL (ref 0.00–0.07)
Basophils Absolute: 0 10*3/uL (ref 0.0–0.1)
Basophils Relative: 1 %
Eosinophils Absolute: 0.1 10*3/uL (ref 0.0–0.5)
Eosinophils Relative: 2 %
HCT: 35.3 % — ABNORMAL LOW (ref 36.0–46.0)
Hemoglobin: 12.3 g/dL (ref 12.0–15.0)
Immature Granulocytes: 0 %
Lymphocytes Relative: 34 %
Lymphs Abs: 1.6 10*3/uL (ref 0.7–4.0)
MCH: 33.1 pg (ref 26.0–34.0)
MCHC: 34.8 g/dL (ref 30.0–36.0)
MCV: 94.9 fL (ref 80.0–100.0)
Monocytes Absolute: 0.5 10*3/uL (ref 0.1–1.0)
Monocytes Relative: 12 %
Neutro Abs: 2.4 10*3/uL (ref 1.7–7.7)
Neutrophils Relative %: 51 %
Platelet Count: 306 10*3/uL (ref 150–400)
RBC: 3.72 MIL/uL — ABNORMAL LOW (ref 3.87–5.11)
RDW: 12.7 % (ref 11.5–15.5)
WBC Count: 4.5 10*3/uL (ref 4.0–10.5)
nRBC: 0 % (ref 0.0–0.2)

## 2023-11-04 LAB — CMP (CANCER CENTER ONLY)
ALT: 37 U/L (ref 0–44)
AST: 25 U/L (ref 15–41)
Albumin: 4.3 g/dL (ref 3.5–5.0)
Alkaline Phosphatase: 82 U/L (ref 38–126)
Anion gap: 5 (ref 5–15)
BUN: 12 mg/dL (ref 8–23)
CO2: 28 mmol/L (ref 22–32)
Calcium: 9.3 mg/dL (ref 8.9–10.3)
Chloride: 106 mmol/L (ref 98–111)
Creatinine: 0.77 mg/dL (ref 0.44–1.00)
GFR, Estimated: >60 mL/min (ref >60)
Glucose, Bld: 93 mg/dL (ref 70–99)
Potassium: 4.2 mmol/L (ref 3.5–5.1)
Sodium: 139 mmol/L (ref 135–145)
Total Bilirubin: 0.5 mg/dL (ref 0.0–1.2)
Total Protein: 6.7 g/dL (ref 6.5–8.1)

## 2023-11-04 MED ORDER — DEXAMETHASONE SODIUM PHOSPHATE 10 MG/ML IJ SOLN
10.0000 mg | Freq: Once | INTRAMUSCULAR | Status: AC
  Administered 2023-11-04: 10 mg via INTRAVENOUS
  Filled 2023-11-04: qty 1

## 2023-11-04 MED ORDER — SODIUM CHLORIDE 0.9 % IV SOLN: INTRAVENOUS | Status: DC

## 2023-11-04 MED ORDER — SODIUM CHLORIDE 0.9 % IV SOLN
60.0000 mg/m2 | Freq: Once | INTRAVENOUS | Status: AC
  Administered 2023-11-04: 114 mg via INTRAVENOUS
  Filled 2023-11-04: qty 19

## 2023-11-04 MED ORDER — DIPHENHYDRAMINE HCL 50 MG/ML IJ SOLN
50.0000 mg | Freq: Once | INTRAMUSCULAR | Status: AC
  Administered 2023-11-04: 50 mg via INTRAVENOUS
  Filled 2023-11-04: qty 1

## 2023-11-04 MED ORDER — SODIUM CHLORIDE 0.9% FLUSH
10.0000 mL | Freq: Once | INTRAVENOUS | Status: AC
  Administered 2023-11-04: 10 mL

## 2023-11-04 MED ORDER — HEPARIN SOD (PORK) LOCK FLUSH 100 UNIT/ML IV SOLN
500.0000 [IU] | Freq: Once | INTRAVENOUS | Status: AC | PRN
  Administered 2023-11-04: 500 [IU]

## 2023-11-04 MED ORDER — SODIUM CHLORIDE 0.9% FLUSH
10.0000 mL | INTRAVENOUS | Status: DC | PRN
  Administered 2023-11-04: 10 mL

## 2023-11-04 MED ORDER — FAMOTIDINE IN NACL 20-0.9 MG/50ML-% IV SOLN
20.0000 mg | Freq: Once | INTRAVENOUS | Status: AC
  Administered 2023-11-04: 20 mg via INTRAVENOUS
  Filled 2023-11-04: qty 50

## 2023-11-04 NOTE — Patient Instructions (Signed)
 [facility] - A DEPT OF [facility]  Discharge Instructions: Thank you for choosing [facility] to provide your oncology and hematology care.   If you have a lab appointment with the Cancer Center, please go directly to the Cancer Center and check in at the registration area.   Wear comfortable clothing and clothing appropriate for easy access to any Portacath or PICC line.   We strive to give you quality time with your provider. You may need to reschedule your appointment if you arrive late (15 or more minutes).  Arriving late affects you and other patients whose appointments are after yours.  Also, if you miss three or more appointments without notifying the office, you may be dismissed from the clinic at the provider's discretion.      For prescription refill requests, have your pharmacy contact our office and allow 72 hours for refills to be completed.    Today you received the following chemotherapy and/or immunotherapy agent: Paclitaxel (Taxol).      To help prevent nausea and vomiting after your treatment, we encourage you to take your nausea medication as directed.  BELOW ARE SYMPTOMS THAT SHOULD BE REPORTED IMMEDIATELY: *FEVER GREATER THAN 100.4 F (38 C) OR HIGHER *CHILLS OR SWEATING *NAUSEA AND VOMITING THAT IS NOT CONTROLLED WITH YOUR NAUSEA MEDICATION *UNUSUAL SHORTNESS OF BREATH *UNUSUAL BRUISING OR BLEEDING *URINARY PROBLEMS (pain or burning when urinating, or frequent urination) *BOWEL PROBLEMS (unusual diarrhea, constipation, pain near the anus) TENDERNESS IN MOUTH AND THROAT WITH OR WITHOUT PRESENCE OF ULCERS (sore throat, sores in mouth, or a toothache) UNUSUAL RASH, SWELLING OR PAIN  UNUSUAL VAGINAL DISCHARGE OR ITCHING   Items with * indicate a potential emergency and should be followed up as soon as possible or go to the Emergency Department if any problems should occur.  Please show the CHEMOTHERAPY ALERT CARD or  IMMUNOTHERAPY ALERT CARD at check-in to the Emergency Department and triage nurse.  Should you have questions after your visit or need to cancel or reschedule your appointment, please contact [facility] - A DEPT OF [facility]  Dept: [phone]  and follow the prompts.  Office hours are 8:00 a.m. to 4:30 p.m. Monday - Friday. Please note that voicemails left after 4:00 p.m. may not be returned until the following business day.  We are closed weekends and major holidays. You have access to a nurse at all times for urgent questions. Please call the main number to the clinic Dept: [phone] and follow the prompts.   For any non-urgent questions, you may also contact your provider using MyChart. We now offer e-Visits for anyone 108 and older to request care online for non-urgent symptoms. For details visit [email or web address].   Also download the MyChart app! Go to the app store, search "MyChart", open the app, select [facility], and log in with your MyChart username and password.

## 2023-11-08 LAB — HM DIABETES EYE EXAM

## 2023-11-14 ENCOUNTER — Ambulatory Visit: Attending: General Surgery

## 2023-11-14 ENCOUNTER — Telehealth: Payer: Self-pay | Admitting: Radiation Oncology

## 2023-11-14 VITALS — Wt 171.1 lb

## 2023-11-14 DIAGNOSIS — Z483 Aftercare following surgery for neoplasm: Secondary | ICD-10-CM | POA: Insufficient documentation

## 2023-11-14 NOTE — Telephone Encounter (Signed)
 Per PA Lanell, CT SIM appt needs to be moved past 7/14. Called pt who agreed to telephone consult 7/8 and CT [facility] 7/15. Appt changes made.

## 2023-11-14 NOTE — Therapy (Signed)
 OUTPATIENT PHYSICAL THERAPY SOZO SCREENING NOTE   Patient Name: Brittany [name] MRN: [id number] DOB:December 14, 1955, 68 y.o., female Today's Date: 11/14/2023  PCP: Elnor [name], NP REFERRING PROVIDER: Aron Shoulders, MD   PT End of Session - 11/14/23 [phone]     Visit Number 3   # unchanged due to screen only   PT Start Time 0944    PT Stop Time 1002    PT Time Calculation (min) 18 min    Activity Tolerance Patient tolerated treatment well    Behavior During Therapy [facility] for tasks assessed/performed          Past Medical History:  Diagnosis Date   Anxiety and depression    Elevated LDL cholesterol level    Elevated liver enzymes 07/23/2021   GERD (gastroesophageal reflux disease)    Malignant neoplasm of breast (female) (HCC)    Thyroid  disorder 11/14/2017   Past Surgical History:  Procedure Laterality Date   AXILLARY LYMPH NODE DISSECTION Right 05/18/2023   Procedure: RIGHT AXILLARY LYMPH NODE DISSECTION;  Surgeon: Aron Shoulders, MD;  [facility];  Service: General;  Laterality: Right;  PEC BLOCK   BREAST BIOPSY Right 03/09/2023   US  RT BREAST BX W LOC DEV 1ST LESION IMG BX SPEC US  GUIDE 03/09/2023 [facility] MAMMOGRAPHY   BREAST BIOPSY  04/12/2023   US  RT RADIOACTIVE SEED LOC 04/12/2023 [facility] ULTRASOUND   BREAST LUMPECTOMY WITH RADIOACTIVE SEED AND SENTINEL LYMPH NODE BIOPSY Right 04/13/2023   Procedure: RIGHT BREAST SEED LOCALIZED LUMPECTOMY WITH SENTINEL NODE BIOPSY;  Surgeon: Aron Shoulders, MD;  Location: [facility];  Service: General;  Laterality: Right;   HERNIA REPAIR     LAPAROSCOPIC HYSTERECTOMY  03/2017   Partial, pt still has both adnexa   PORTACATH PLACEMENT N/A 05/18/2023   Procedure: PORT PLACEMENT WITH ULTRASOUND GUIDANCE;  Surgeon: Aron Shoulders, MD;  [facility];  Service: General;  Laterality: N/A;   Patient Active Problem List   Diagnosis Date Noted   Port-A-Cath in place 05/31/2023   Genetic testing 03/28/2023   Family history of breast  cancer 03/16/2023   Malignant neoplasm of upper-outer quadrant of right breast in female, estrogen receptor positive (HCC) 03/14/2023   Arthralgia of right knee 01/28/2023   Pneumococcal vaccination administered at current visit 01/28/2023   Decreased GFR 12/25/2021   Encounter for general adult medical examination with abnormal findings 07/23/2021   Overweight (BMI 25.0-29.9) 07/23/2021   Hyperlipidemia 07/23/2021   Prediabetes 07/23/2021   Ceruminosis, bilateral 02/18/2021   Vitamin D  deficiency 04/01/2020   Elevated LDL cholesterol level    Anxiety and depression 11/14/2017   Abnormal mammogram with microcalcification 11/14/2017   Thyroid  disorder 11/14/2017    REFERRING DIAG: right breast cancer at risk for lymphedema  THERAPY DIAG: Aftercare following surgery for neoplasm  PERTINENT HISTORY:  Patient was diagnosed on 02/22/2023 with right grade 2 invasive ductal carcinoma breast cancer. It measures 1.3 cm and is located in the upper outer quadrant. It is ER/PR positive and HER2 negative with a Ki67 of 5% . She is now s/p Right lumpectomy with SLNB and 4+/5 LN's on 04/13/2023. She  had an ALND on 05/17/2022 with 5+/13 LN'. Her drain was pulled on 06/08/2023 and she has had some clear drainage from a seroma.She will be having chemotherapy, and likely radiation and adjuvant CDK 4 6 inhibition with antiestrogen therapy.   PRECAUTIONS: right UE Lymphedema risk, None  SUBJECTIVE: Pt returns for her 3 month L-Dex screen.   PAIN:  Are you having pain? No  SOZO SCREENING:  Patient was assessed today using the SOZO machine to determine the lymphedema index score. This was compared to her baseline score. It was determined that she is NOT within the recommended range when compared to her baseline and so she was fitted for a compression garment while in the clinic today (size III Medi Harmony, black). It is recommended she return in 1 month to be reassessed. If she continues to measure outside  the recommended range, physical therapy treatment will be recommended at that time and a referral requested.     L-DEX FLOWSHEETS - 11/14/23 0900       L-DEX LYMPHEDEMA SCREENING   Measurement Type Unilateral    L-DEX MEASUREMENT EXTREMITY Upper Extremity    POSITION  Standing    DOMINANT SIDE Right    At Risk Side Right    BASELINE SCORE (UNILATERAL) 2.1    L-DEX SCORE (UNILATERAL) 10.4    VALUE CHANGE (UNILAT) 8.3         P: Pt to return in 1 month for SOZO reassess. If still elevated, schedule for eval per our protocol to be educated in self MLD.    Aden Berwyn Caldron, PTA 11/14/2023, 11:03 AM    PLEASE KEEP YOUR COMPRESSION GARMENT ON DURING THE DAY TO GET THE BEST SWELLING REDUCTION. HERE ARE SOME ADDITIONAL TIPS: Do not sleep in your garment. If you have pain or notice swelling in your hand or at the top of your shoulder, call your therapist. This may be a sign that you need a different garment. 3.  Take good care of your garment so it lasts longer: Follow washing instructions on your garment label or box. Wash periodically using a mild detergent in warm water.  Do not use fabric softener or bleach.  Place garment in a mesh lingerie bag and use the gentle cycle of the washing machine or hand wash. Tumble dry low or lay flat to dry. TAKE CARE OF YOUR SKIN Apply a low pH moisturizing lotion to your skin daily Avoid scratching your skin Treat skin irritations quickly  Know the 5 warning signs of infection: redness, pain, warmth to touch, fever and increased swelling.  Call your physician immediately if you notice any of these signs of a possible infection.    Cancer Rehab [phone]

## 2023-11-14 NOTE — Progress Notes (Incomplete Revision)
 Radiation Oncology         (336) [phone] ________________________________  Name: Brittany [name]        MRN: [id number]  Date of Service: 11/15/2023 DOB: [birth year]  RR:Hmjb, [name], NP  Loretha Ash, MD     REFERRING PHYSICIAN: Loretha Ash, MD   DIAGNOSIS: The encounter diagnosis was Malignant neoplasm of upper-outer quadrant of right breast in female, estrogen receptor positive (HCC).   HISTORY OF PRESENT ILLNESS: Brittany [name] is a 68 y.o. female originally seen in the multidisciplinary breast clinic for a new diagnosis of right breast cancer. The patient was noted to have screening detected mass and calcifications in the right breast. She underwent diagnostic imaging on 03/08/23 including an ultrasound that showed a 1.3 cm mass in the 10:00 position. Her axilla was negative for adenopathy. She underwent a biopsy on 03/09/23 that showed a grade 2 invasive lobular carcinoma that was ER/PR positive, HER2 negative with a Ki 67 of 5%.   Since her last visit, the patient underwent a right lumpectomy with sentinel lymph node biopsy on 04/13/2023 with Dr. Aron.  This showed a grade 2 invasive lobular carcinoma measuring 1.9 cm LCIS was also present.  Her margins were less than 1 mm to the anterior for invasive disease and less than 1 mm anteriorly for DCIS.  The remainder of her margins were negative.  And 4 of the 5 sampled lymph nodes were positive.  She returned for ALND on 05/18/23 and 5 of the additional 13 lymph nodes were consistent with metastatic lobular carcinoma.  Given her N2 disease, she was counseled on adjuvant chemotherapy which she completed between 06/09/23-11/04/23.  She is seen to review the rationale for adjuvant radiation to the right breast and regional lymph nodes.    PREVIOUS RADIATION THERAPY: No   PAST MEDICAL HISTORY:  Past Medical History:  Diagnosis Date   Anxiety and depression    Elevated LDL cholesterol level    Elevated liver enzymes 07/23/2021    GERD (gastroesophageal reflux disease)    Malignant neoplasm of breast (female) (HCC)    Thyroid  disorder 11/14/2017       PAST SURGICAL HISTORY: Past Surgical History:  Procedure Laterality Date   AXILLARY LYMPH NODE DISSECTION Right 05/18/2023   Procedure: RIGHT AXILLARY LYMPH NODE DISSECTION;  Surgeon: Aron Shoulders, MD;  [facility];  Service: General;  Laterality: Right;  PEC BLOCK   BREAST BIOPSY Right 03/09/2023   US  RT BREAST BX W LOC DEV 1ST LESION IMG BX SPEC US  GUIDE 03/09/2023 [facility] MAMMOGRAPHY   BREAST BIOPSY  04/12/2023   US  RT RADIOACTIVE SEED LOC 04/12/2023 [facility] ULTRASOUND   BREAST LUMPECTOMY WITH RADIOACTIVE SEED AND SENTINEL LYMPH NODE BIOPSY Right 04/13/2023   Procedure: RIGHT BREAST SEED LOCALIZED LUMPECTOMY WITH SENTINEL NODE BIOPSY;  Surgeon: Aron Shoulders, MD;  Location: [facility];  Service: General;  Laterality: Right;   HERNIA REPAIR     LAPAROSCOPIC HYSTERECTOMY  03/2017   Partial, pt still has both adnexa   PORTACATH PLACEMENT N/A 05/18/2023   Procedure: PORT PLACEMENT WITH ULTRASOUND GUIDANCE;  Surgeon: Aron Shoulders, MD;  [facility];  Service: General;  Laterality: N/A;     FAMILY HISTORY:  Family History  Problem Relation Age of Onset   Breast cancer Mother 75   Diabetes Mother    Hypertension Mother    Cancer Father 90 - 4       liver?   Diabetes Brother    Cancer Paternal  Uncle    Breast cancer Paternal Grandmother 67     SOCIAL HISTORY:  reports that she has quit smoking. She has never used smokeless tobacco. She reports current alcohol use. She reports that she does not use drugs. The patient is divorced and lives in [place]. 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   ALLERGIES: Wellbutrin [bupropion]   MEDICATIONS:  Current Outpatient Medications  Medication Sig Dispense Refill   Biotin 5000 MCG CAPS Take 1 capsule by mouth daily.     cycloSPORINE (RESTASIS)  0.05 % ophthalmic emulsion Place 1 drop into both eyes 2 (two) times daily.     EVENING PRIMROSE OIL PO Take 1 tablet by mouth daily.     Flaxseed, Linseed, (FLAX SEED OIL PO) Take 1 capsule by mouth daily.     glucosamine-chondroitin 500-400 MG tablet Take 1 tablet by mouth daily.     lidocaine -prilocaine  (EMLA ) cream Apply to affected area once 30 g 3   MAGNESIUM GLYCINATE PO Take 350 mg by mouth daily.     Methylsulfonylmethane (MSM) 1000 MG TABS Take 1 tablet by mouth 3 (three) times a week.     milk thistle 175 MG tablet Take 175 mg by mouth daily.     ondansetron  (ZOFRAN ) 8 MG tablet Take 1 tab (8 mg) by mouth every 8 hrs as needed for nausea/vomiting. Start third day after doxorubicin /cyclophosphamide  chemotherapy. (Patient not taking: Reported on 10/14/2023) 30 tablet 1   OVER THE COUNTER MEDICATION Holy basil -   5 times a week     prochlorperazine  (COMPAZINE ) 10 MG tablet Take 1 tablet (10 mg total) by mouth every 6 (six) hours as needed for nausea or vomiting. (Patient not taking: Reported on 10/14/2023) 30 tablet 1   pyridoxine (B-6) 100 MG tablet Take 100 mg by mouth daily.     vitamin B-12 (CYANOCOBALAMIN) 500 MCG tablet Take 500 mcg by mouth daily.     vitamin C (ASCORBIC ACID) 250 MG tablet Take 250 mg by mouth daily.     VITAMIN D  PO Take 1 tablet by mouth daily.     No current facility-administered medications for this encounter.     REVIEW OF SYSTEMS: On review of systems, the patient reports that she is doing well overall in navigating the diagnosis. She has noted some bruising of the right breast since her biopsy. No other complaints are verbalized.      PHYSICAL EXAM:  Wt Readings from Last 3 Encounters:  11/14/23 171 lb 2 oz (77.6 kg)  11/04/23 171 lb 4 oz (77.7 kg)  10/28/23 169 lb 1.6 oz (76.7 kg)   Temp Readings from Last 3 Encounters:  11/04/23 97.8 F (36.6 C) (Oral)  10/28/23 98 F (36.7 C) (Temporal)  10/21/23 97.8 F (36.6 C) (Tympanic)   BP Readings  from Last 3 Encounters:  11/04/23 127/86  10/28/23 124/81  10/21/23 113/63   Pulse Readings from Last 3 Encounters:  11/04/23 91  10/28/23 80  10/21/23 83    In general this is a well appearing female in no acute distress. She's alert and oriented x4 and appropriate throughout the examination. Cardiopulmonary assessment is negative for acute distress and she exhibits normal effort. Bilateral breast exam is deferred.    ECOG = 1  0 - Asymptomatic (Fully active, able to carry on all predisease activities without restriction)  1 - Symptomatic but completely ambulatory (Restricted in physically strenuous activity but ambulatory and able to carry out  work of a light or sedentary nature. For example, light housework, office work)  2 - Symptomatic, <50% in bed during the day (Ambulatory and capable of all self care but unable to carry out any work activities. Up and about more than 50% of waking hours)  3 - Symptomatic, >50% in bed, but not bedbound (Capable of only limited self-care, confined to bed or chair 50% or more of waking hours)  4 - Bedbound (Completely disabled. Cannot carry on any self-care. Totally confined to bed or chair)  5 - Death   Raylene MM, Creech RH, Tormey DC, et al. [phone]). Toxicity and response criteria of the [facility]. Am. DOROTHA Bridges. Oncol. 5 (6): 649-55    LABORATORY DATA:  Lab Results  Component Value Date   WBC 4.5 11/04/2023   HGB 12.3 11/04/2023   HCT 35.3 (L) 11/04/2023   MCV 94.9 11/04/2023   PLT 306 11/04/2023   Lab Results  Component Value Date   NA 139 11/04/2023   K 4.2 11/04/2023   CL 106 11/04/2023   CO2 28 11/04/2023   Lab Results  Component Value Date   ALT 37 11/04/2023   AST 25 11/04/2023   ALKPHOS 82 11/04/2023   BILITOT 0.5 11/04/2023      RADIOGRAPHY: No results found.     IMPRESSION/PLAN: 1. Stage IB, pT1cN2aM0, grade 2 ER/PR positive invasive lobular carcinoma of the right breast. Dr. Dewey  has reviewed her final pathology findings from both surgeries and her course of treatments to date. She has done well since both of her surgeries and has completed chemotherapy. She is counseled on the role for external radiotherapy to the breast and regional nodes to reduce risks of local recurrence followed by antiestrogen therapy. We discussed the risks, benefits, short, and long term effects of radiotherapy, as well as the curative intent, and the patient is interested in proceeding. I reviewed the delivery and logistics of radiotherapy and that Dr. Dewey recommends  6 1/2  weeks of radiotherapy to the right breast and regional lymph node stations. She will simulate on ***.    In a visit lasting *** minutes, greater than 50% of the time was spent face to face reviewing her case, as well as in preparation of, discussing, and coordinating the patient's care.      Donald KYM Husband, [facility]    **Disclaimer: This note was dictated with voice recognition software. Similar sounding words can inadvertently be transcribed and this note may contain transcription errors which may not have been corrected upon publication of note.**

## 2023-11-14 NOTE — Progress Notes (Signed)
 Radiation Oncology         [phone] ________________________________  Name: Brittany [name]        MRN: [id number]  Date of Service: 11/15/2023 DOB: [birth year]  RR:Hmjb, [name], NP  Loretha Ash, MD     REFERRING PHYSICIAN: Loretha Ash, MD   DIAGNOSIS: There were no encounter diagnoses.   HISTORY OF PRESENT ILLNESS: Brittany [name] is a 68 y.o. female originally seen in the multidisciplinary breast clinic for a new diagnosis of right breast cancer. The patient was noted to have screening detected mass and calcifications in the right breast. She underwent diagnostic imaging on 03/08/23 including an ultrasound that showed a 1.3 cm mass in the 10:00 position. Her axilla was negative for adenopathy. She underwent a biopsy on 03/09/23 that showed a grade 2 invasive lobular carcinoma that was ER/PR positive, HER2 negative with a Ki 67 of 5%.   Since her last visit, the patient underwent a right lumpectomy with sentinel lymph node biopsy on 04/13/2023 with Dr. Aron.  This showed a grade 2 invasive lobular carcinoma measuring 1.9 cm LCIS was also present.  Her margins were less than 1 mm to the anterior for invasive disease and less than 1 mm anteriorly for DCIS.  The remainder of her margins were negative.  And 4 of the 5 sampled lymph nodes were positive.  She returned for ALND on 825 and 5 of the additional 13 lymph nodes were consistent with metastatic lobular carcinoma.  Given her N2 disease, she was counseled on adjuvant chemotherapy which she completed between 06/09/23-11/04/23.  She is seen to review the rationale for adjuvant radiation to the right breast and regional lymph nodes.    PREVIOUS RADIATION THERAPY: No   PAST MEDICAL HISTORY:  Past Medical History:  Diagnosis Date   Anxiety and depression    Elevated LDL cholesterol level    Elevated liver enzymes 07/23/2021   GERD (gastroesophageal reflux disease)    Malignant neoplasm of breast (female) (HCC)    Thyroid   disorder 11/14/2017       PAST SURGICAL HISTORY: Past Surgical History:  Procedure Laterality Date   AXILLARY LYMPH NODE DISSECTION Right 05/18/2023   Procedure: RIGHT AXILLARY LYMPH NODE DISSECTION;  Surgeon: Aron Shoulders, MD;  [facility];  Service: General;  Laterality: Right;  PEC BLOCK   BREAST BIOPSY Right 03/09/2023   US  RT BREAST BX W LOC DEV 1ST LESION IMG BX SPEC US  GUIDE 03/09/2023 [facility] MAMMOGRAPHY   BREAST BIOPSY  04/12/2023   US  RT RADIOACTIVE SEED LOC 04/12/2023 [facility] ULTRASOUND   BREAST LUMPECTOMY WITH RADIOACTIVE SEED AND SENTINEL LYMPH NODE BIOPSY Right 04/13/2023   Procedure: RIGHT BREAST SEED LOCALIZED LUMPECTOMY WITH SENTINEL NODE BIOPSY;  Surgeon: Aron Shoulders, MD;  Location: [facility];  Service: General;  Laterality: Right;   HERNIA REPAIR     LAPAROSCOPIC HYSTERECTOMY  03/2017   Partial, pt still has both adnexa   PORTACATH PLACEMENT N/A 05/18/2023   Procedure: PORT PLACEMENT WITH ULTRASOUND GUIDANCE;  Surgeon: Aron Shoulders, MD;  [facility];  Service: General;  Laterality: N/A;     FAMILY HISTORY:  Family History  Problem Relation Age of Onset   Breast cancer Mother 22   Diabetes Mother    Hypertension Mother    Cancer Father 52 - 13       liver?   Diabetes Brother    Cancer Paternal Uncle    Breast cancer Paternal Grandmother 91  SOCIAL HISTORY:  reports that she has quit smoking. She has never used smokeless tobacco. She reports current alcohol use. She reports that she does not use drugs. The patient is divorced and lives in [place]. 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   ALLERGIES: Wellbutrin [bupropion]   MEDICATIONS:  Current Outpatient Medications  Medication Sig Dispense Refill   Biotin 5000 MCG CAPS Take 1 capsule by mouth daily.     cycloSPORINE (RESTASIS) 0.05 % ophthalmic emulsion Place 1 drop into both eyes 2 (two) times daily.     EVENING PRIMROSE OIL  PO Take 1 tablet by mouth daily.     Flaxseed, Linseed, (FLAX SEED OIL PO) Take 1 capsule by mouth daily.     glucosamine-chondroitin 500-400 MG tablet Take 1 tablet by mouth daily.     lidocaine -prilocaine  (EMLA ) cream Apply to affected area once 30 g 3   MAGNESIUM GLYCINATE PO Take 350 mg by mouth daily.     Methylsulfonylmethane (MSM) 1000 MG TABS Take 1 tablet by mouth 3 (three) times a week.     milk thistle 175 MG tablet Take 175 mg by mouth daily.     ondansetron  (ZOFRAN ) 8 MG tablet Take 1 tab (8 mg) by mouth every 8 hrs as needed for nausea/vomiting. Start third day after doxorubicin /cyclophosphamide  chemotherapy. (Patient not taking: Reported on 10/14/2023) 30 tablet 1   OVER THE COUNTER MEDICATION Holy basil -   5 times a week     prochlorperazine  (COMPAZINE ) 10 MG tablet Take 1 tablet (10 mg total) by mouth every 6 (six) hours as needed for nausea or vomiting. (Patient not taking: Reported on 10/14/2023) 30 tablet 1   pyridoxine (B-6) 100 MG tablet Take 100 mg by mouth daily.     vitamin B-12 (CYANOCOBALAMIN) 500 MCG tablet Take 500 mcg by mouth daily.     vitamin C (ASCORBIC ACID) 250 MG tablet Take 250 mg by mouth daily.     VITAMIN D  PO Take 1 tablet by mouth daily.     No current facility-administered medications for this encounter.     REVIEW OF SYSTEMS: On review of systems, the patient reports that she is doing well overall in navigating the diagnosis. She has noted some bruising of the right breast since her biopsy. No other complaints are verbalized.      PHYSICAL EXAM:  Wt Readings from Last 3 Encounters:  11/14/23 171 lb 2 oz (77.6 kg)  11/04/23 171 lb 4 oz (77.7 kg)  10/28/23 169 lb 1.6 oz (76.7 kg)   Temp Readings from Last 3 Encounters:  11/04/23 97.8 F (36.6 C) (Oral)  10/28/23 98 F (36.7 C) (Temporal)  10/21/23 97.8 F (36.6 C) (Tympanic)   BP Readings from Last 3 Encounters:  11/04/23 127/86  10/28/23 124/81  10/21/23 113/63   Pulse Readings from  Last 3 Encounters:  11/04/23 91  10/28/23 80  10/21/23 83    In general this is a well appearing female in no acute distress. She's alert and oriented x4 and appropriate throughout the examination. Cardiopulmonary assessment is negative for acute distress and she exhibits normal effort. Bilateral breast exam is deferred.    ECOG = 1  0 - Asymptomatic (Fully active, able to carry on all predisease activities without restriction)  1 - Symptomatic but completely ambulatory (Restricted in physically strenuous activity but ambulatory and able to carry out work of a light or sedentary nature. For example, light housework, office work)  2 - Symptomatic, <50% in bed during the day (Ambulatory and capable of all self care but unable to carry out any work activities. Up and about more than 50% of waking hours)  3 - Symptomatic, >50% in bed, but not bedbound (Capable of only limited self-care, confined to bed or chair 50% or more of waking hours)  4 - Bedbound (Completely disabled. Cannot carry on any self-care. Totally confined to bed or chair)  5 - Death   Raylene MM, Creech RH, Tormey DC, et al. [phone]). Toxicity and response criteria of the [facility]. Am. DOROTHA Bridges. Oncol. 5 (6): 649-55    LABORATORY DATA:  Lab Results  Component Value Date   WBC 4.5 11/04/2023   HGB 12.3 11/04/2023   HCT 35.3 (L) 11/04/2023   MCV 94.9 11/04/2023   PLT 306 11/04/2023   Lab Results  Component Value Date   NA 139 11/04/2023   K 4.2 11/04/2023   CL 106 11/04/2023   CO2 28 11/04/2023   Lab Results  Component Value Date   ALT 37 11/04/2023   AST 25 11/04/2023   ALKPHOS 82 11/04/2023   BILITOT 0.5 11/04/2023      RADIOGRAPHY: No results found.     IMPRESSION/PLAN: 1. Stage IA, cT1cN0M0, grade 2 ER/PR positive invasive lobular carcinoma of the right breast. Dr. Dewey discusses the pathology findings and reviews the nature of early stage right breast disease. The  consensus from the breast conference includes MRI for extent of disease. Dr. Aron would offer breast conservation with lumpectomy with  sentinel node biopsy. Dr. Loretha recommends Oncotype Dx score to determine a role for systemic therapy. Provided that chemotherapy is not indicated, the patient's course would then be followed by external radiotherapy to the breast  to reduce risks of local recurrence followed by antiestrogen therapy. We discussed the risks, benefits, short, and long term effects of radiotherapy, as well as the curative intent, and the patient is interested in proceeding. Dr. Dewey discusses the delivery and logistics of radiotherapy and anticipates a course of 4 or up to 6 1/2  weeks of radiotherapy to the right breast. We will see her back a few weeks after surgery to discuss the simulation process and anticipate we starting radiotherapy about 4-6 weeks after surgery.  2. Possible genetic predisposition to malignancy. The patient is a candidate for genetic testing given her personal and family history. She will meet with our geneticist today in clinic.   In a visit lasting 60 minutes, greater than 50% of the time was spent face to face reviewing her case, as well as in preparation of, discussing, and coordinating the patient's care.  The above documentation reflects my direct findings during this shared patient visit. Please see the separate note by Dr. Dewey on this date for the remainder of the patient's plan of care.    Donald KYM Husband, [facility]    **Disclaimer: This note was dictated with voice recognition software. Similar sounding words can inadvertently be transcribed and this note may contain transcription errors which may not have been corrected upon publication of note.**

## 2023-11-15 ENCOUNTER — Ambulatory Visit
Admission: RE | Admit: 2023-11-15 | Discharge: 2023-11-15 | Disposition: A | Discharge status: Home / Self Care | Source: Ambulatory Visit | Attending: Radiation Oncology | Admitting: Radiation Oncology

## 2023-11-15 ENCOUNTER — Ambulatory Visit: Admitting: Radiation Oncology

## 2023-11-15 ENCOUNTER — Encounter: Payer: Self-pay | Admitting: *Deleted

## 2023-11-15 ENCOUNTER — Encounter: Payer: Self-pay | Admitting: Radiation Oncology

## 2023-11-15 VITALS — BP 145/93 | HR 85 | Temp 97.8°F | Resp 18 | Ht 64.0 in | Wt 172.8 lb

## 2023-11-15 DIAGNOSIS — Z79899 Other long term (current) drug therapy: Secondary | ICD-10-CM | POA: Insufficient documentation

## 2023-11-15 DIAGNOSIS — K219 Gastro-esophageal reflux disease without esophagitis: Secondary | ICD-10-CM | POA: Diagnosis not present

## 2023-11-15 DIAGNOSIS — Z170 Estrogen receptor positive status [ER+]: Secondary | ICD-10-CM | POA: Insufficient documentation

## 2023-11-15 DIAGNOSIS — Z809 Family history of malignant neoplasm, unspecified: Secondary | ICD-10-CM | POA: Diagnosis not present

## 2023-11-15 DIAGNOSIS — Z87891 Personal history of nicotine dependence: Secondary | ICD-10-CM | POA: Insufficient documentation

## 2023-11-15 DIAGNOSIS — Z90710 Acquired absence of both cervix and uterus: Secondary | ICD-10-CM | POA: Insufficient documentation

## 2023-11-15 DIAGNOSIS — E7800 Pure hypercholesterolemia, unspecified: Secondary | ICD-10-CM | POA: Insufficient documentation

## 2023-11-15 DIAGNOSIS — E079 Disorder of thyroid, unspecified: Secondary | ICD-10-CM | POA: Insufficient documentation

## 2023-11-15 DIAGNOSIS — R748 Abnormal levels of other serum enzymes: Secondary | ICD-10-CM | POA: Diagnosis not present

## 2023-11-15 DIAGNOSIS — C50411 Malignant neoplasm of upper-outer quadrant of right female breast: Secondary | ICD-10-CM

## 2023-11-15 DIAGNOSIS — Z803 Family history of malignant neoplasm of breast: Secondary | ICD-10-CM | POA: Insufficient documentation

## 2023-11-19 ENCOUNTER — Encounter: Payer: Self-pay | Admitting: Hematology and Oncology

## 2023-11-21 ENCOUNTER — Encounter: Payer: Self-pay | Admitting: *Deleted

## 2023-11-21 DIAGNOSIS — C50411 Malignant neoplasm of upper-outer quadrant of right female breast: Secondary | ICD-10-CM

## 2023-11-22 ENCOUNTER — Ambulatory Visit
Admission: RE | Admit: 2023-11-22 | Discharge: 2023-11-22 | Disposition: A | Discharge status: Home / Self Care | Source: Ambulatory Visit | Attending: Radiation Oncology | Admitting: Radiation Oncology

## 2023-11-22 DIAGNOSIS — Z170 Estrogen receptor positive status [ER+]: Secondary | ICD-10-CM | POA: Diagnosis not present

## 2023-11-22 DIAGNOSIS — C50411 Malignant neoplasm of upper-outer quadrant of right female breast: Secondary | ICD-10-CM | POA: Diagnosis not present

## 2023-11-22 DIAGNOSIS — Z510 Encounter for antineoplastic radiation therapy: Secondary | ICD-10-CM | POA: Insufficient documentation

## 2023-12-02 DIAGNOSIS — Z510 Encounter for antineoplastic radiation therapy: Secondary | ICD-10-CM | POA: Diagnosis not present

## 2023-12-05 ENCOUNTER — Other Ambulatory Visit: Payer: Self-pay

## 2023-12-05 ENCOUNTER — Ambulatory Visit
Admission: RE | Admit: 2023-12-05 | Discharge: 2023-12-05 | Disposition: A | Discharge status: Home / Self Care | Source: Ambulatory Visit | Attending: Radiation Oncology | Admitting: Radiation Oncology

## 2023-12-05 DIAGNOSIS — Z510 Encounter for antineoplastic radiation therapy: Secondary | ICD-10-CM | POA: Diagnosis not present

## 2023-12-05 LAB — RAD ONC ARIA SESSION SUMMARY

## 2023-12-06 ENCOUNTER — Ambulatory Visit
Admission: RE | Admit: 2023-12-06 | Discharge: 2023-12-06 | Disposition: A | Discharge status: Home / Self Care | Source: Ambulatory Visit | Attending: Radiation Oncology | Admitting: Radiation Oncology

## 2023-12-06 ENCOUNTER — Other Ambulatory Visit: Payer: Self-pay

## 2023-12-06 DIAGNOSIS — Z510 Encounter for antineoplastic radiation therapy: Secondary | ICD-10-CM | POA: Diagnosis not present

## 2023-12-06 LAB — RAD ONC ARIA SESSION SUMMARY

## 2023-12-07 ENCOUNTER — Other Ambulatory Visit: Payer: Self-pay

## 2023-12-07 ENCOUNTER — Ambulatory Visit
Admission: RE | Admit: 2023-12-07 | Discharge: 2023-12-07 | Disposition: A | Discharge status: Home / Self Care | Source: Ambulatory Visit | Attending: Radiation Oncology

## 2023-12-07 DIAGNOSIS — Z510 Encounter for antineoplastic radiation therapy: Secondary | ICD-10-CM | POA: Diagnosis not present

## 2023-12-07 LAB — RAD ONC ARIA SESSION SUMMARY

## 2023-12-08 ENCOUNTER — Ambulatory Visit
Admission: RE | Admit: 2023-12-08 | Discharge: 2023-12-08 | Disposition: A | Discharge status: Home / Self Care | Source: Ambulatory Visit | Attending: Radiation Oncology | Admitting: Radiation Oncology

## 2023-12-08 ENCOUNTER — Other Ambulatory Visit: Payer: Self-pay

## 2023-12-08 DIAGNOSIS — Z510 Encounter for antineoplastic radiation therapy: Secondary | ICD-10-CM | POA: Diagnosis not present

## 2023-12-08 LAB — RAD ONC ARIA SESSION SUMMARY

## 2023-12-09 ENCOUNTER — Ambulatory Visit
Admission: RE | Admit: 2023-12-09 | Discharge: 2023-12-09 | Disposition: A | Discharge status: Home / Self Care | Source: Ambulatory Visit | Attending: Radiation Oncology | Admitting: Radiation Oncology

## 2023-12-09 ENCOUNTER — Other Ambulatory Visit: Payer: Self-pay

## 2023-12-09 ENCOUNTER — Other Ambulatory Visit: Payer: Self-pay | Admitting: Nurse Practitioner

## 2023-12-09 DIAGNOSIS — Z510 Encounter for antineoplastic radiation therapy: Secondary | ICD-10-CM | POA: Insufficient documentation

## 2023-12-09 DIAGNOSIS — Z170 Estrogen receptor positive status [ER+]: Secondary | ICD-10-CM | POA: Insufficient documentation

## 2023-12-09 DIAGNOSIS — Z17411 Hormone receptor positive with human epidermal growth factor receptor 2 negative status: Secondary | ICD-10-CM | POA: Diagnosis not present

## 2023-12-09 DIAGNOSIS — C50411 Malignant neoplasm of upper-outer quadrant of right female breast: Secondary | ICD-10-CM | POA: Diagnosis present

## 2023-12-09 DIAGNOSIS — Z1721 Progesterone receptor positive status: Secondary | ICD-10-CM | POA: Diagnosis not present

## 2023-12-09 DIAGNOSIS — Z923 Personal history of irradiation: Secondary | ICD-10-CM | POA: Diagnosis not present

## 2023-12-09 DIAGNOSIS — F32A Depression, unspecified: Secondary | ICD-10-CM

## 2023-12-09 DIAGNOSIS — F17210 Nicotine dependence, cigarettes, uncomplicated: Secondary | ICD-10-CM | POA: Diagnosis not present

## 2023-12-09 LAB — RAD ONC ARIA SESSION SUMMARY
Course Elapsed Days: 4
Plan Fractions Treated to Date: 5
Plan Fractions Treated to Date: 5
Plan Prescribed Dose Per Fraction: 1.8 Gy
Plan Prescribed Dose Per Fraction: 1.8 Gy
Plan Total Fractions Prescribed: 28
Plan Total Fractions Prescribed: 28
Plan Total Prescribed Dose: 50.4 Gy
Plan Total Prescribed Dose: 50.4 Gy
Reference Point Dosage Given to Date: 9 Gy
Reference Point Dosage Given to Date: 9 Gy
Reference Point Session Dosage Given: 1.8 Gy
Reference Point Session Dosage Given: 1.8 Gy
Session Number: 5

## 2023-12-12 ENCOUNTER — Ambulatory Visit: Admission: RE | Admit: 2023-12-12 | Source: Ambulatory Visit

## 2023-12-13 ENCOUNTER — Ambulatory Visit
Admission: RE | Admit: 2023-12-13 | Discharge: 2023-12-13 | Disposition: A | Discharge status: Home / Self Care | Source: Ambulatory Visit | Attending: Radiation Oncology | Admitting: Radiation Oncology

## 2023-12-13 ENCOUNTER — Other Ambulatory Visit: Payer: Self-pay

## 2023-12-13 DIAGNOSIS — Z510 Encounter for antineoplastic radiation therapy: Secondary | ICD-10-CM | POA: Diagnosis not present

## 2023-12-13 LAB — RAD ONC ARIA SESSION SUMMARY

## 2023-12-14 ENCOUNTER — Other Ambulatory Visit: Payer: Self-pay

## 2023-12-14 ENCOUNTER — Ambulatory Visit
Admission: RE | Admit: 2023-12-14 | Discharge: 2023-12-14 | Disposition: A | Discharge status: Home / Self Care | Source: Ambulatory Visit | Attending: Radiation Oncology

## 2023-12-14 DIAGNOSIS — Z510 Encounter for antineoplastic radiation therapy: Secondary | ICD-10-CM | POA: Diagnosis not present

## 2023-12-14 LAB — RAD ONC ARIA SESSION SUMMARY
Course Elapsed Days: 9
Plan Fractions Treated to Date: 7
Plan Fractions Treated to Date: 7
Plan Prescribed Dose Per Fraction: 1.8 Gy
Plan Prescribed Dose Per Fraction: 1.8 Gy
Plan Total Fractions Prescribed: 28
Plan Total Fractions Prescribed: 28
Plan Total Prescribed Dose: 50.4 Gy
Plan Total Prescribed Dose: 50.4 Gy
Reference Point Dosage Given to Date: 12.6 Gy
Reference Point Dosage Given to Date: 12.6 Gy
Reference Point Session Dosage Given: 1.8 Gy
Reference Point Session Dosage Given: 1.8 Gy
Session Number: 7

## 2023-12-15 ENCOUNTER — Other Ambulatory Visit: Payer: Self-pay

## 2023-12-15 ENCOUNTER — Ambulatory Visit
Admission: RE | Admit: 2023-12-15 | Discharge: 2023-12-15 | Disposition: A | Discharge status: Home / Self Care | Source: Ambulatory Visit | Attending: Radiation Oncology

## 2023-12-15 ENCOUNTER — Ambulatory Visit
Admission: RE | Admit: 2023-12-15 | Discharge: 2023-12-15 | Disposition: A | Discharge status: Home / Self Care | Source: Ambulatory Visit | Attending: Radiation Oncology | Admitting: Radiation Oncology

## 2023-12-15 DIAGNOSIS — Z510 Encounter for antineoplastic radiation therapy: Secondary | ICD-10-CM | POA: Diagnosis not present

## 2023-12-15 LAB — RAD ONC ARIA SESSION SUMMARY
Course Elapsed Days: 10
Plan Fractions Treated to Date: 8
Plan Fractions Treated to Date: 8
Plan Prescribed Dose Per Fraction: 1.8 Gy
Plan Prescribed Dose Per Fraction: 1.8 Gy
Plan Total Fractions Prescribed: 28
Plan Total Fractions Prescribed: 28
Plan Total Prescribed Dose: 50.4 Gy
Plan Total Prescribed Dose: 50.4 Gy
Reference Point Dosage Given to Date: 14.4 Gy
Reference Point Dosage Given to Date: 14.4 Gy
Reference Point Session Dosage Given: 1.8 Gy
Reference Point Session Dosage Given: 1.8 Gy
Session Number: 8

## 2023-12-16 ENCOUNTER — Other Ambulatory Visit: Payer: Self-pay

## 2023-12-16 ENCOUNTER — Ambulatory Visit

## 2023-12-16 ENCOUNTER — Ambulatory Visit
Admission: RE | Admit: 2023-12-16 | Discharge: 2023-12-16 | Disposition: A | Discharge status: Home / Self Care | Source: Ambulatory Visit | Attending: Radiation Oncology | Admitting: Radiation Oncology

## 2023-12-16 DIAGNOSIS — Z510 Encounter for antineoplastic radiation therapy: Secondary | ICD-10-CM | POA: Diagnosis not present

## 2023-12-16 LAB — RAD ONC ARIA SESSION SUMMARY
Course Elapsed Days: 11
Plan Fractions Treated to Date: 9
Plan Fractions Treated to Date: 9
Plan Prescribed Dose Per Fraction: 1.8 Gy
Plan Prescribed Dose Per Fraction: 1.8 Gy
Plan Total Fractions Prescribed: 28
Plan Total Fractions Prescribed: 28
Plan Total Prescribed Dose: 50.4 Gy
Plan Total Prescribed Dose: 50.4 Gy
Reference Point Dosage Given to Date: 16.2 Gy
Reference Point Dosage Given to Date: 16.2 Gy
Reference Point Session Dosage Given: 1.8 Gy
Reference Point Session Dosage Given: 1.8 Gy
Session Number: 9

## 2023-12-19 ENCOUNTER — Ambulatory Visit: Attending: General Surgery

## 2023-12-19 ENCOUNTER — Ambulatory Visit
Admission: RE | Admit: 2023-12-19 | Discharge: 2023-12-19 | Disposition: A | Discharge status: Home / Self Care | Source: Ambulatory Visit | Attending: Radiation Oncology | Admitting: Radiation Oncology

## 2023-12-19 ENCOUNTER — Other Ambulatory Visit: Payer: Self-pay

## 2023-12-19 VITALS — Wt 174.1 lb

## 2023-12-19 DIAGNOSIS — Z483 Aftercare following surgery for neoplasm: Secondary | ICD-10-CM | POA: Insufficient documentation

## 2023-12-19 DIAGNOSIS — Z510 Encounter for antineoplastic radiation therapy: Secondary | ICD-10-CM | POA: Diagnosis not present

## 2023-12-19 LAB — RAD ONC ARIA SESSION SUMMARY
Course Elapsed Days: 14
Plan Fractions Treated to Date: 10
Plan Fractions Treated to Date: 10
Plan Prescribed Dose Per Fraction: 1.8 Gy
Plan Prescribed Dose Per Fraction: 1.8 Gy
Plan Total Fractions Prescribed: 28
Plan Total Fractions Prescribed: 28
Plan Total Prescribed Dose: 50.4 Gy
Plan Total Prescribed Dose: 50.4 Gy
Reference Point Dosage Given to Date: 18 Gy
Reference Point Dosage Given to Date: 18 Gy
Reference Point Session Dosage Given: 1.8 Gy
Reference Point Session Dosage Given: 1.8 Gy
Session Number: 10

## 2023-12-19 NOTE — Therapy (Signed)
 OUTPATIENT PHYSICAL THERAPY SOZO SCREENING NOTE   Patient Name: Brittany [name] MRN: [id number] DOB:[birth year], 68 y.o., female Today's Date: 12/19/2023  PCP: Elnor [name], NP REFERRING PROVIDER: Aron Shoulders, MD   PT End of Session - 12/19/23 1645     Visit Number 3   # unchanged due to screen only   PT Start Time 1643    PT Stop Time 1647    PT Time Calculation (min) 4 min    Activity Tolerance Patient tolerated treatment well    Behavior During Therapy [facility] for tasks assessed/performed          Past Medical History:  Diagnosis Date   Anxiety and depression    Elevated LDL cholesterol level    Elevated liver enzymes 07/23/2021   GERD (gastroesophageal reflux disease)    Malignant neoplasm of breast (female) (HCC)    Thyroid  disorder 11/14/2017   Past Surgical History:  Procedure Laterality Date   AXILLARY LYMPH NODE DISSECTION Right 05/18/2023   Procedure: RIGHT AXILLARY LYMPH NODE DISSECTION;  Surgeon: Aron Shoulders, MD;  Location: [facility];  Service: General;  Laterality: Right;  PEC BLOCK   BREAST BIOPSY Right 03/09/2023   US  RT BREAST BX W LOC DEV 1ST LESION IMG BX SPEC US  GUIDE 03/09/2023 [facility] MAMMOGRAPHY   BREAST BIOPSY  04/12/2023   US  RT RADIOACTIVE SEED LOC 04/12/2023 [facility] ULTRASOUND   BREAST LUMPECTOMY WITH RADIOACTIVE SEED AND SENTINEL LYMPH NODE BIOPSY Right 04/13/2023   Procedure: RIGHT BREAST SEED LOCALIZED LUMPECTOMY WITH SENTINEL NODE BIOPSY;  Surgeon: Aron Shoulders, MD;  Location:  SURGERY CENTER;  Service: General;  Laterality: Right;   HERNIA REPAIR     LAPAROSCOPIC HYSTERECTOMY  03/2017   Partial, pt still has both adnexa   PORTACATH PLACEMENT N/A 05/18/2023   Procedure: PORT PLACEMENT WITH ULTRASOUND GUIDANCE;  Surgeon: Aron Shoulders, MD;  Location: [facility];  Service: General;  Laterality: N/A;   Patient Active Problem List   Diagnosis Date Noted   Port-A-Cath in place 05/31/2023   Genetic testing 03/28/2023   Family history of  breast cancer 03/16/2023   Malignant neoplasm of upper-outer quadrant of right breast in female, estrogen receptor positive (HCC) 03/14/2023   Arthralgia of right knee 01/28/2023   Pneumococcal vaccination administered at current visit 01/28/2023   Decreased GFR 12/25/2021   Encounter for general adult medical examination with abnormal findings 07/23/2021   Overweight (BMI 25.0-29.9) 07/23/2021   Hyperlipidemia 07/23/2021   Prediabetes 07/23/2021   Ceruminosis, bilateral 02/18/2021   Vitamin D  deficiency 04/01/2020   Elevated LDL cholesterol level    Anxiety and depression 11/14/2017   Abnormal mammogram with microcalcification 11/14/2017   Thyroid  disorder 11/14/2017    REFERRING DIAG: right breast cancer at risk for lymphedema  THERAPY DIAG: Aftercare following surgery for neoplasm  PERTINENT HISTORY: Patient was diagnosed on 02/22/2023 with right grade 2 invasive ductal carcinoma breast cancer. It measures 1.3 cm and is located in the upper outer quadrant. It is ER/PR positive and HER2 negative with a Ki67 of 5% . She is now s/p Right lumpectomy with SLNB and 4+/5 LN's on 04/13/2023. She  had an ALND on 05/17/2022 with 5+/13 LN'. Her drain was pulled on 06/08/2023 and she has had some clear drainage from a seroma.She will be having chemotherapy, and likely radiation and adjuvant CDK 4 6 inhibition with antiestrogen therapy.   PRECAUTIONS: right UE Lymphedema risk, None  SUBJECTIVE: Pt returns for her 1 month L-Dex screen after having a  high change from baseline. I wore my sleeve about 90% of the time. I'll keep wearing it during radiation.  PAIN:  Are you having pain? No  SOZO SCREENING: Patient was assessed today using the SOZO machine to determine the lymphedema index score. This was compared to her baseline score. It was determined that she is within the recommended range when compared to her baseline and no further action is needed at this time. She will continue SOZO screenings.  These are done every 3 months for 2 years post operatively followed by every 6 months for 2 years, and then annually.   L-DEX FLOWSHEETS - 12/19/23 1600       L-DEX LYMPHEDEMA SCREENING   Measurement Type Unilateral    L-DEX MEASUREMENT EXTREMITY Upper Extremity    POSITION  Standing    DOMINANT SIDE Right    At Risk Side Right    BASELINE SCORE (UNILATERAL) 2.1    L-DEX SCORE (UNILATERAL) 6.4    VALUE CHANGE (UNILAT) 4.3         P: Resume every 3 month L-Dex screens.    Aden Berwyn Caldron, PTA 12/19/2023, 4:49 PM

## 2023-12-20 ENCOUNTER — Ambulatory Visit
Admission: RE | Admit: 2023-12-20 | Discharge: 2023-12-20 | Disposition: A | Discharge status: Home / Self Care | Source: Ambulatory Visit | Attending: Radiation Oncology

## 2023-12-20 ENCOUNTER — Other Ambulatory Visit: Payer: Self-pay

## 2023-12-20 DIAGNOSIS — Z510 Encounter for antineoplastic radiation therapy: Secondary | ICD-10-CM | POA: Diagnosis not present

## 2023-12-20 LAB — RAD ONC ARIA SESSION SUMMARY

## 2023-12-21 ENCOUNTER — Ambulatory Visit
Admission: RE | Admit: 2023-12-21 | Discharge: 2023-12-21 | Disposition: A | Discharge status: Home / Self Care | Source: Ambulatory Visit | Attending: Radiation Oncology | Admitting: Radiation Oncology

## 2023-12-21 ENCOUNTER — Other Ambulatory Visit: Payer: Self-pay

## 2023-12-21 DIAGNOSIS — Z510 Encounter for antineoplastic radiation therapy: Secondary | ICD-10-CM | POA: Diagnosis not present

## 2023-12-21 LAB — RAD ONC ARIA SESSION SUMMARY
Course Elapsed Days: 16
Plan Fractions Treated to Date: 12
Plan Fractions Treated to Date: 12
Plan Prescribed Dose Per Fraction: 1.8 Gy
Plan Prescribed Dose Per Fraction: 1.8 Gy
Plan Total Fractions Prescribed: 28
Plan Total Fractions Prescribed: 28
Plan Total Prescribed Dose: 50.4 Gy
Plan Total Prescribed Dose: 50.4 Gy
Reference Point Dosage Given to Date: 21.6 Gy
Reference Point Dosage Given to Date: 21.6 Gy
Reference Point Session Dosage Given: 1.8 Gy
Reference Point Session Dosage Given: 1.8 Gy
Session Number: 12

## 2023-12-22 ENCOUNTER — Other Ambulatory Visit: Payer: Self-pay

## 2023-12-22 ENCOUNTER — Ambulatory Visit
Admission: RE | Admit: 2023-12-22 | Discharge: 2023-12-22 | Disposition: A | Discharge status: Home / Self Care | Source: Ambulatory Visit | Attending: Radiation Oncology | Admitting: Radiation Oncology

## 2023-12-22 DIAGNOSIS — Z510 Encounter for antineoplastic radiation therapy: Secondary | ICD-10-CM | POA: Diagnosis not present

## 2023-12-22 LAB — RAD ONC ARIA SESSION SUMMARY
Course Elapsed Days: 17
Plan Fractions Treated to Date: 13
Plan Fractions Treated to Date: 13
Plan Prescribed Dose Per Fraction: 1.8 Gy
Plan Prescribed Dose Per Fraction: 1.8 Gy
Plan Total Fractions Prescribed: 28
Plan Total Fractions Prescribed: 28
Plan Total Prescribed Dose: 50.4 Gy
Plan Total Prescribed Dose: 50.4 Gy
Reference Point Dosage Given to Date: 23.4 Gy
Reference Point Dosage Given to Date: 23.4 Gy
Reference Point Session Dosage Given: 1.8 Gy
Reference Point Session Dosage Given: 1.8 Gy
Session Number: 13

## 2023-12-23 ENCOUNTER — Ambulatory Visit
Admission: RE | Admit: 2023-12-23 | Discharge: 2023-12-23 | Disposition: A | Discharge status: Home / Self Care | Source: Ambulatory Visit | Attending: Radiation Oncology | Admitting: Radiation Oncology

## 2023-12-23 ENCOUNTER — Other Ambulatory Visit: Payer: Self-pay

## 2023-12-23 ENCOUNTER — Inpatient Hospital Stay: Attending: Hematology and Oncology | Admitting: Hematology and Oncology

## 2023-12-23 VITALS — BP 110/74 | HR 102 | Temp 98.0°F | Resp 18 | Wt 175.0 lb

## 2023-12-23 DIAGNOSIS — Z17411 Hormone receptor positive with human epidermal growth factor receptor 2 negative status: Secondary | ICD-10-CM | POA: Insufficient documentation

## 2023-12-23 DIAGNOSIS — Z170 Estrogen receptor positive status [ER+]: Secondary | ICD-10-CM | POA: Insufficient documentation

## 2023-12-23 DIAGNOSIS — F17210 Nicotine dependence, cigarettes, uncomplicated: Secondary | ICD-10-CM | POA: Insufficient documentation

## 2023-12-23 DIAGNOSIS — Z1721 Progesterone receptor positive status: Secondary | ICD-10-CM | POA: Insufficient documentation

## 2023-12-23 DIAGNOSIS — C50411 Malignant neoplasm of upper-outer quadrant of right female breast: Secondary | ICD-10-CM | POA: Insufficient documentation

## 2023-12-23 DIAGNOSIS — Z923 Personal history of irradiation: Secondary | ICD-10-CM | POA: Insufficient documentation

## 2023-12-23 DIAGNOSIS — Z510 Encounter for antineoplastic radiation therapy: Secondary | ICD-10-CM | POA: Diagnosis not present

## 2023-12-23 LAB — RAD ONC ARIA SESSION SUMMARY
Course Elapsed Days: 18
Plan Fractions Treated to Date: 14
Plan Fractions Treated to Date: 14
Plan Prescribed Dose Per Fraction: 1.8 Gy
Plan Prescribed Dose Per Fraction: 1.8 Gy
Plan Total Fractions Prescribed: 28
Plan Total Fractions Prescribed: 28
Plan Total Prescribed Dose: 50.4 Gy
Plan Total Prescribed Dose: 50.4 Gy
Reference Point Dosage Given to Date: 25.2 Gy
Reference Point Dosage Given to Date: 25.2 Gy
Reference Point Session Dosage Given: 1.8 Gy
Reference Point Session Dosage Given: 1.8 Gy
Session Number: 14

## 2023-12-23 NOTE — Progress Notes (Signed)
 [facility] Cancer Follow up:    Elnor Lauraine BRAVO, NP [address] [place] [address]   DIAGNOSIS:  Cancer Staging  Malignant neoplasm of upper-outer quadrant of right breast in female, estrogen receptor positive (HCC) Staging form: Breast, AJCC 8th Edition - Pathologic stage from 05/31/2023: Stage IB (pT1c, pN2a, cM0, G2, ER+, PR+, HER2-) - Signed by Loretha Ash, MD on 05/31/2023 Stage prefix: Initial diagnosis Method of lymph node assessment: Axillary lymph node dissection Histologic grading system: 3 grade system    SUMMARY OF ONCOLOGIC HISTORY: Oncology History  Malignant neoplasm of upper-outer quadrant of right breast in female, estrogen receptor positive (HCC)  02/22/2023 Mammogram   Screening mammogram on February 22, 2023 showed possible mass in the right breast with calcifications warranting further evaluation.  No findings suspicious for malignancy in the left breast.  Diagnostic mammogram done confirmed a persistent irregular mass within the central right breast, adjacent to the mass are slightly heterogeneous calcifications.  Ultrasound showed 1.1 x 1.2 x 1.3 cm irregular hypoechoic mass at 10 o'clock position of the right breast 1 cm from the nipple, no abnormal right axillary lymph nodes are noted.   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  Prognostic showed ER 100% positive strong staining PR 95% positive strong staining Ki-67 of 5% and HER2 2+   03/14/2023 Initial Diagnosis   Malignant neoplasm of upper-outer quadrant of right breast in female, estrogen receptor positive (HCC)    Genetic Testing   [facility] CancerNext-Expanded Panel+RNA was Negative. Report date is 03/27/2023.   The CancerNext-Expanded gene panel offered by [facility] and includes sequencing, rearrangement, and RNA analysis for the  following 71 genes: AIP, ALK, APC, ATM, AXIN2, BAP1, BARD1, BMPR1A, BRCA1, BRCA2, BRIP1, CDC73, CDH1, CDK4, CDKN1B, CDKN2A, CHEK2, CTNNA1, DICER1, FH, FLCN, KIF1B, LZTR1, MAX, MEN1, MET, MLH1, MSH2, MSH3, MSH6, MUTYH, NF1, NF2, NTHL1, PALB2, PHOX2B, PMS2, POT1, PRKAR1A, PTCH1, PTEN, RAD51C, RAD51D, RB1, RET, SDHA, SDHAF2, SDHB, SDHC, SDHD, SMAD4, SMARCA4, SMARCB1, SMARCE1, STK11, SUFU, TMEM127, TP53, TSC1, TSC2, and VHL (sequencing and deletion/duplication); EGFR, EGLN1, HOXB13, KIT, MITF, PDGFRA, POLD1, and POLE (sequencing only); EPCAM and GREM1 (deletion/duplication only).    05/31/2023 Cancer Staging   Staging form: Breast, AJCC 8th Edition - Pathologic stage from 05/31/2023: Stage IB (pT1c, pN2a, cM0, G2, ER+, PR+, HER2-) - Signed by Loretha Ash, MD on 05/31/2023 Stage prefix: Initial diagnosis Method of lymph node assessment: Axillary lymph node dissection Histologic grading system: 3 grade system   06/09/2023 -  Chemotherapy   Patient is on Treatment Plan : BREAST DOSE DENSE AC q14d / PACLitaxel  q7d       CURRENT THERAPY: Radiation  INTERVAL HISTORY:  Discussed the use of AI scribe software for clinical note transcription with the patient, who gave verbal consent to proceed.  History of Present Illness Brittany [name] is a 68 year old female who presents for follow-up while she is on radiation  She experiences neuropathy in her toes, which she initially thought might be related to diabetes but acknowledges is likely a side effect of chemotherapy. She uses Tiger Balm for relief, which she finds helpful.  She developed shingles two weeks after completing chemotherapy, but it was not severe and occurred before starting radiation.  She experienced dental issues, including a cap popping off her tooth, leading to an infection that required another root  canal. She discussed her medications with her dentist to ensure no contraindications with her cancer treatment.  She underwent  menopause at age 65 and had a hysterectomy at age 48 due to polycystic growths. She did not experience hot flashes during menopause.  She plans to travel to [place] in December to visit her daughter, who works there as a Emergency planning/management officer for [employer].   Rest of the pertinent 10 point ROS reviewed and neg.  Patient Active Problem List   Diagnosis Date Noted   Port-A-Cath in place 05/31/2023   Genetic testing 03/28/2023   Family history of breast cancer 03/16/2023   Malignant neoplasm of upper-outer quadrant of right breast in female, estrogen receptor positive (HCC) 03/14/2023   Arthralgia of right knee 01/28/2023   Pneumococcal vaccination administered at current visit 01/28/2023   Decreased GFR 12/25/2021   Encounter for general adult medical examination with abnormal findings 07/23/2021   Overweight (BMI 25.0-29.9) 07/23/2021   Hyperlipidemia 07/23/2021   Prediabetes 07/23/2021   Ceruminosis, bilateral 02/18/2021   Vitamin D  deficiency 04/01/2020   Elevated LDL cholesterol level    Anxiety and depression 11/14/2017   Abnormal mammogram with microcalcification 11/14/2017   Thyroid  disorder 11/14/2017    is allergic to wellbutrin [bupropion].  MEDICAL HISTORY: Past Medical History:  Diagnosis Date   Anxiety and depression    Elevated LDL cholesterol level    Elevated liver enzymes 07/23/2021   GERD (gastroesophageal reflux disease)    Malignant neoplasm of breast (female) (HCC)    Thyroid  disorder 11/14/2017    SURGICAL HISTORY: Past Surgical History:  Procedure Laterality Date   AXILLARY LYMPH NODE DISSECTION Right 05/18/2023   Procedure: RIGHT AXILLARY LYMPH NODE DISSECTION;  Surgeon: Aron Shoulders, MD;  [facility];  Service: General;  Laterality: Right;  PEC BLOCK   BREAST BIOPSY Right 03/09/2023   US  RT BREAST BX W LOC DEV 1ST LESION IMG BX SPEC US  GUIDE 03/09/2023 [facility] MAMMOGRAPHY   BREAST BIOPSY  04/12/2023   US  RT RADIOACTIVE SEED LOC  04/12/2023 [facility] ULTRASOUND   BREAST LUMPECTOMY WITH RADIOACTIVE SEED AND SENTINEL LYMPH NODE BIOPSY Right 04/13/2023   Procedure: RIGHT BREAST SEED LOCALIZED LUMPECTOMY WITH SENTINEL NODE BIOPSY;  Surgeon: Aron Shoulders, MD;  Location: [facility];  Service: General;  Laterality: Right;   HERNIA REPAIR     LAPAROSCOPIC HYSTERECTOMY  03/2017   Partial, pt still has both adnexa   PORTACATH PLACEMENT N/A 05/18/2023   Procedure: PORT PLACEMENT WITH ULTRASOUND GUIDANCE;  Surgeon: Aron Shoulders, MD;  Location: [facility];  Service: General;  Laterality: N/A;    SOCIAL HISTORY: Social History   Socioeconomic History   Marital status: Divorced    Spouse name: Not on file   Number of children: Not on file   Years of education: Not on file   Highest education level: Master's degree (e.g., MA, MS, MEng, MEd, MSW, MBA)  Occupational History   Not on file  Tobacco Use   Smoking status: Former   Smokeless tobacco: Never   Tobacco comments:    stopped at age 51. smoked for 4-5 years 1 ppd  Vaping Use   Vaping status: Never Used  Substance and Sexual Activity   Alcohol use: Yes    Comment: WINE OCC   Drug use: Never   Sexual activity: Not Currently    Partners: Male    Birth control/protection: Surgical    Comment: hysterectomy  Other Topics Concern   Not on file  Social  History Narrative   Not on file   Social Drivers of Health   Financial Resource Strain: Low Risk  (06/20/2023)   Overall Financial Resource Strain (CARDIA)    Difficulty of Paying Living Expenses: Not very hard  Food Insecurity: No Food Insecurity (11/15/2023)   Hunger Vital Sign    Worried About Running Out of Food in the Last Year: Never true    Ran Out of Food in the Last Year: Never true  Transportation Needs: No Transportation Needs (11/15/2023)   PRAPARE - Administrator, Civil Service (Medical): No    Lack of Transportation (Non-Medical): No  Physical Activity: Insufficiently Active  (06/20/2023)   Exercise Vital Sign    Days of Exercise per Week: 3 days    Minutes of Exercise per Session: 30 min  Stress: Stress Concern Present (06/20/2023)   [employer] of Occupational Health - Occupational Stress Questionnaire    Feeling of Stress : To some extent  Social Connections: Unknown (06/20/2023)   Social Connection and Isolation Panel    Frequency of Communication with Friends and Family: More than three times a week    Frequency of Social Gatherings with Friends and Family: Once a week    Attends Religious Services: Patient declined    Database administrator or Organizations: No    Attends Engineer, structural: Not on file    Marital Status: Divorced  Intimate Partner Violence: Not At Risk (11/15/2023)   Humiliation, Afraid, Rape, and Kick questionnaire    Fear of Current or Ex-Partner: No    Emotionally Abused: No    Physically Abused: No    Sexually Abused: No    FAMILY HISTORY: Family History  Problem Relation Age of Onset   Breast cancer Mother 87   Diabetes Mother    Hypertension Mother    Cancer Father 81 - 81       liver?   Diabetes Brother    Cancer Paternal Uncle    Breast cancer Paternal Grandmother 50    Review of Systems  Constitutional:  Positive for fatigue. Negative for appetite change, chills, fever and unexpected weight change.  HENT:   Negative for hearing loss, lump/mass and trouble swallowing.   Eyes:  Negative for eye problems and icterus.  Respiratory:  Negative for chest tightness, cough and shortness of breath.   Cardiovascular:  Negative for chest pain, leg swelling and palpitations.  Gastrointestinal:  Negative for abdominal distention, abdominal pain, constipation, diarrhea, nausea and vomiting.  Endocrine: Negative for hot flashes.  Genitourinary:  Negative for difficulty urinating.   Musculoskeletal:  Negative for arthralgias.  Skin:  Negative for itching and rash.  Neurological:  Positive for numbness. Negative  for dizziness, extremity weakness and headaches.  Hematological:  Negative for adenopathy. Does not bruise/bleed easily.  Psychiatric/Behavioral:  Negative for depression. The patient is not nervous/anxious.       PHYSICAL EXAMINATION   Onc Performance Status - 12/23/23 1100       KPS SCALE   KPS % SCORE Able to carry on normal activity, minor s/s of disease          Vitals:   12/23/23 1142  BP: 110/74  Pulse: (!) 102  Resp: 18  Temp: 98 F (36.7 C)  SpO2: 95%   PE deferred in lieu of counseling  LABORATORY DATA:  CBC    Component Value Date/Time   WBC 4.5 11/04/2023 1007   WBC 7.5 05/13/2023 1040   RBC 3.72 (  L) 11/04/2023 1007   HGB 12.3 11/04/2023 1007   HGB 13.4 04/01/2020 0912   HCT 35.3 (L) 11/04/2023 1007   HCT 39.0 04/01/2020 0912   PLT 306 11/04/2023 1007   PLT 331 04/01/2020 0912   MCV 94.9 11/04/2023 1007   MCV 93 04/01/2020 0912   MCH 33.1 11/04/2023 1007   MCHC 34.8 11/04/2023 1007   RDW 12.7 11/04/2023 1007   RDW 12.6 04/01/2020 0912   LYMPHSABS 1.6 11/04/2023 1007   LYMPHSABS 2.8 04/01/2020 0912   MONOABS 0.5 11/04/2023 1007   EOSABS 0.1 11/04/2023 1007   EOSABS 0.1 04/01/2020 0912   BASOSABS 0.0 11/04/2023 1007   BASOSABS 0.0 04/01/2020 0912    CMP     Component Value Date/Time   NA 139 11/04/2023 1007   NA 140 04/01/2020 0912   K 4.2 11/04/2023 1007   CL 106 11/04/2023 1007   CO2 28 11/04/2023 1007   GLUCOSE 93 11/04/2023 1007   BUN 12 11/04/2023 1007   BUN 10 04/01/2020 0912   CREATININE 0.77 11/04/2023 1007   CALCIUM 9.3 11/04/2023 1007   PROT 6.7 11/04/2023 1007   PROT 6.6 04/01/2020 0912   ALBUMIN 4.3 11/04/2023 1007   ALBUMIN 4.3 04/01/2020 0912   AST 25 11/04/2023 1007   ALT 37 11/04/2023 1007   ALKPHOS 82 11/04/2023 1007   BILITOT 0.5 11/04/2023 1007   GFRNONAA >60 11/04/2023 1007   GFRAA 71 04/01/2020 0912     ASSESSMENT and THERAPY PLAN:    Assessment and Plan Assessment & Plan 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 Undergoing radiation therapy, expected to finish on September 12th.   - Complete radiation therapy by September 12th. - Initiate anti-estrogen therapy with anastrozole post-radiation. - Start CDK4/6 inhibitor (Abemaciclib) for two years post-radiation. - Monitor blood counts and liver function tests every eight weeks during Abemaciclib treatment. - Recommend Guardant Reveal test for early detection of cancer recurrence.  Chemotherapy-induced peripheral neuropathy Experiencing mild neuropathy in toes, likely due to chemotherapy. Neuropathy is not constant and is managed with Tiger Balm, providing relief. Not related to diabetes. - Continue using Tiger Balm for neuropathy relief. - Monitor neuropathy symptoms and report if she becomes bothersome.   All questions were answered. The patient knows to call the clinic with any problems, questions or concerns. We can certainly see the patient much sooner if necessary.  Total encounter time:30 minutes*in face-to-face visit time, chart review, lab review, care coordination, order entry, and documentation of the encounter time.   *Total Encounter Time as defined by the Centers for Medicare and Medicaid Services includes, in addition to the face-to-face time of a patient visit (documented in the note above) non-face-to-face time: obtaining and reviewing outside history, ordering and reviewing medications, tests or procedures, care coordination (communications with other health care professionals or caregivers) and documentation in the medical record.

## 2023-12-26 ENCOUNTER — Telehealth: Payer: Self-pay | Admitting: *Deleted

## 2023-12-26 ENCOUNTER — Other Ambulatory Visit: Payer: Self-pay

## 2023-12-26 ENCOUNTER — Ambulatory Visit
Admission: RE | Admit: 2023-12-26 | Discharge: 2023-12-26 | Disposition: A | Discharge status: Home / Self Care | Source: Ambulatory Visit | Attending: Radiation Oncology

## 2023-12-26 DIAGNOSIS — Z510 Encounter for antineoplastic radiation therapy: Secondary | ICD-10-CM | POA: Diagnosis not present

## 2023-12-26 LAB — RAD ONC ARIA SESSION SUMMARY

## 2023-12-26 NOTE — Telephone Encounter (Signed)
 Spoke with patient to let her know that Dr. Loretha and Dr.Byerly would like for her to keep the port for awhile before having it removed.   Patient verbalized understanding.

## 2023-12-27 ENCOUNTER — Other Ambulatory Visit: Payer: Self-pay

## 2023-12-27 ENCOUNTER — Ambulatory Visit
Admission: RE | Admit: 2023-12-27 | Discharge: 2023-12-27 | Disposition: A | Discharge status: Home / Self Care | Source: Ambulatory Visit | Attending: Radiation Oncology | Admitting: Radiation Oncology

## 2023-12-27 DIAGNOSIS — Z510 Encounter for antineoplastic radiation therapy: Secondary | ICD-10-CM | POA: Diagnosis not present

## 2023-12-27 LAB — RAD ONC ARIA SESSION SUMMARY
Course Elapsed Days: 22
Plan Fractions Treated to Date: 16
Plan Fractions Treated to Date: 16
Plan Prescribed Dose Per Fraction: 1.8 Gy
Plan Prescribed Dose Per Fraction: 1.8 Gy
Plan Total Fractions Prescribed: 28
Plan Total Fractions Prescribed: 28
Plan Total Prescribed Dose: 50.4 Gy
Plan Total Prescribed Dose: 50.4 Gy
Reference Point Dosage Given to Date: 28.8 Gy
Reference Point Dosage Given to Date: 28.8 Gy
Reference Point Session Dosage Given: 1.8 Gy
Reference Point Session Dosage Given: 1.8 Gy
Session Number: 16

## 2023-12-28 ENCOUNTER — Ambulatory Visit
Admission: RE | Admit: 2023-12-28 | Discharge: 2023-12-28 | Disposition: A | Discharge status: Home / Self Care | Source: Ambulatory Visit | Attending: Radiation Oncology | Admitting: Radiation Oncology

## 2023-12-28 ENCOUNTER — Other Ambulatory Visit: Payer: Self-pay

## 2023-12-28 DIAGNOSIS — Z510 Encounter for antineoplastic radiation therapy: Secondary | ICD-10-CM | POA: Diagnosis not present

## 2023-12-28 LAB — RAD ONC ARIA SESSION SUMMARY
Course Elapsed Days: 23
Plan Fractions Treated to Date: 17
Plan Fractions Treated to Date: 17
Plan Prescribed Dose Per Fraction: 1.8 Gy
Plan Prescribed Dose Per Fraction: 1.8 Gy
Plan Total Fractions Prescribed: 28
Plan Total Fractions Prescribed: 28
Plan Total Prescribed Dose: 50.4 Gy
Plan Total Prescribed Dose: 50.4 Gy
Reference Point Dosage Given to Date: 30.6 Gy
Reference Point Dosage Given to Date: 30.6 Gy
Reference Point Session Dosage Given: 1.8 Gy
Reference Point Session Dosage Given: 1.8 Gy
Session Number: 17

## 2023-12-29 ENCOUNTER — Other Ambulatory Visit: Payer: Self-pay

## 2023-12-29 ENCOUNTER — Ambulatory Visit
Admission: RE | Admit: 2023-12-29 | Discharge: 2023-12-29 | Disposition: A | Discharge status: Home / Self Care | Source: Ambulatory Visit | Attending: Radiation Oncology

## 2023-12-29 DIAGNOSIS — Z510 Encounter for antineoplastic radiation therapy: Secondary | ICD-10-CM | POA: Diagnosis not present

## 2023-12-29 LAB — RAD ONC ARIA SESSION SUMMARY
Course Elapsed Days: 24
Plan Fractions Treated to Date: 18
Plan Fractions Treated to Date: 18
Plan Prescribed Dose Per Fraction: 1.8 Gy
Plan Prescribed Dose Per Fraction: 1.8 Gy
Plan Total Fractions Prescribed: 28
Plan Total Fractions Prescribed: 28
Plan Total Prescribed Dose: 50.4 Gy
Plan Total Prescribed Dose: 50.4 Gy
Reference Point Dosage Given to Date: 32.4 Gy
Reference Point Dosage Given to Date: 32.4 Gy
Reference Point Session Dosage Given: 1.8 Gy
Reference Point Session Dosage Given: 1.8 Gy
Session Number: 18

## 2023-12-30 ENCOUNTER — Ambulatory Visit
Admission: RE | Admit: 2023-12-30 | Discharge: 2023-12-30 | Disposition: A | Discharge status: Home / Self Care | Source: Ambulatory Visit | Attending: Radiation Oncology | Admitting: Radiation Oncology

## 2023-12-30 ENCOUNTER — Other Ambulatory Visit: Payer: Self-pay

## 2023-12-30 DIAGNOSIS — Z510 Encounter for antineoplastic radiation therapy: Secondary | ICD-10-CM | POA: Diagnosis not present

## 2023-12-30 LAB — RAD ONC ARIA SESSION SUMMARY
Course Elapsed Days: 25
Plan Fractions Treated to Date: 19
Plan Fractions Treated to Date: 19
Plan Prescribed Dose Per Fraction: 1.8 Gy
Plan Prescribed Dose Per Fraction: 1.8 Gy
Plan Total Fractions Prescribed: 28
Plan Total Fractions Prescribed: 28
Plan Total Prescribed Dose: 50.4 Gy
Plan Total Prescribed Dose: 50.4 Gy
Reference Point Dosage Given to Date: 34.2 Gy
Reference Point Dosage Given to Date: 34.2 Gy
Reference Point Session Dosage Given: 1.8 Gy
Reference Point Session Dosage Given: 1.8 Gy
Session Number: 19

## 2024-01-02 ENCOUNTER — Ambulatory Visit
Admission: RE | Admit: 2024-01-02 | Discharge: 2024-01-02 | Disposition: A | Discharge status: Home / Self Care | Source: Ambulatory Visit | Attending: Radiation Oncology

## 2024-01-02 ENCOUNTER — Other Ambulatory Visit: Payer: Self-pay

## 2024-01-02 DIAGNOSIS — Z510 Encounter for antineoplastic radiation therapy: Secondary | ICD-10-CM | POA: Diagnosis not present

## 2024-01-02 LAB — RAD ONC ARIA SESSION SUMMARY
Course Elapsed Days: 28
Plan Fractions Treated to Date: 20
Plan Fractions Treated to Date: 20
Plan Prescribed Dose Per Fraction: 1.8 Gy
Plan Prescribed Dose Per Fraction: 1.8 Gy
Plan Total Fractions Prescribed: 28
Plan Total Fractions Prescribed: 28
Plan Total Prescribed Dose: 50.4 Gy
Plan Total Prescribed Dose: 50.4 Gy
Reference Point Dosage Given to Date: 36 Gy
Reference Point Dosage Given to Date: 36 Gy
Reference Point Session Dosage Given: 1.8 Gy
Reference Point Session Dosage Given: 1.8 Gy
Session Number: 20

## 2024-01-03 ENCOUNTER — Ambulatory Visit
Admission: RE | Admit: 2024-01-03 | Discharge: 2024-01-03 | Disposition: A | Discharge status: Home / Self Care | Source: Ambulatory Visit | Attending: Radiation Oncology | Admitting: Radiation Oncology

## 2024-01-03 ENCOUNTER — Other Ambulatory Visit: Payer: Self-pay

## 2024-01-03 DIAGNOSIS — Z510 Encounter for antineoplastic radiation therapy: Secondary | ICD-10-CM | POA: Diagnosis not present

## 2024-01-03 LAB — RAD ONC ARIA SESSION SUMMARY
Course Elapsed Days: 29
Plan Fractions Treated to Date: 21
Plan Fractions Treated to Date: 21
Plan Prescribed Dose Per Fraction: 1.8 Gy
Plan Prescribed Dose Per Fraction: 1.8 Gy
Plan Total Fractions Prescribed: 28
Plan Total Fractions Prescribed: 28
Plan Total Prescribed Dose: 50.4 Gy
Plan Total Prescribed Dose: 50.4 Gy
Reference Point Dosage Given to Date: 37.8 Gy
Reference Point Dosage Given to Date: 37.8 Gy
Reference Point Session Dosage Given: 1.8 Gy
Reference Point Session Dosage Given: 1.8 Gy
Session Number: 21

## 2024-01-04 ENCOUNTER — Other Ambulatory Visit: Payer: Self-pay

## 2024-01-04 ENCOUNTER — Ambulatory Visit
Admission: RE | Admit: 2024-01-04 | Discharge: 2024-01-04 | Disposition: A | Discharge status: Home / Self Care | Source: Ambulatory Visit | Attending: Radiation Oncology | Admitting: Radiation Oncology

## 2024-01-04 DIAGNOSIS — Z510 Encounter for antineoplastic radiation therapy: Secondary | ICD-10-CM | POA: Diagnosis not present

## 2024-01-04 LAB — RAD ONC ARIA SESSION SUMMARY
Course Elapsed Days: 30
Plan Fractions Treated to Date: 22
Plan Fractions Treated to Date: 22
Plan Prescribed Dose Per Fraction: 1.8 Gy
Plan Prescribed Dose Per Fraction: 1.8 Gy
Plan Total Fractions Prescribed: 28
Plan Total Fractions Prescribed: 28
Plan Total Prescribed Dose: 50.4 Gy
Plan Total Prescribed Dose: 50.4 Gy
Reference Point Dosage Given to Date: 39.6 Gy
Reference Point Dosage Given to Date: 39.6 Gy
Reference Point Session Dosage Given: 1.8 Gy
Reference Point Session Dosage Given: 1.8 Gy
Session Number: 22

## 2024-01-05 ENCOUNTER — Ambulatory Visit
Admission: RE | Admit: 2024-01-05 | Discharge: 2024-01-05 | Disposition: A | Discharge status: Home / Self Care | Source: Ambulatory Visit | Attending: Radiation Oncology | Admitting: Radiation Oncology

## 2024-01-05 ENCOUNTER — Other Ambulatory Visit: Payer: Self-pay

## 2024-01-05 DIAGNOSIS — Z510 Encounter for antineoplastic radiation therapy: Secondary | ICD-10-CM | POA: Diagnosis not present

## 2024-01-05 LAB — RAD ONC ARIA SESSION SUMMARY
Course Elapsed Days: 31
Plan Fractions Treated to Date: 23
Plan Fractions Treated to Date: 23
Plan Prescribed Dose Per Fraction: 1.8 Gy
Plan Prescribed Dose Per Fraction: 1.8 Gy
Plan Total Fractions Prescribed: 28
Plan Total Fractions Prescribed: 28
Plan Total Prescribed Dose: 50.4 Gy
Plan Total Prescribed Dose: 50.4 Gy
Reference Point Dosage Given to Date: 41.4 Gy
Reference Point Dosage Given to Date: 41.4 Gy
Reference Point Session Dosage Given: 1.8 Gy
Reference Point Session Dosage Given: 1.8 Gy
Session Number: 23

## 2024-01-06 ENCOUNTER — Ambulatory Visit
Admission: RE | Admit: 2024-01-06 | Discharge: 2024-01-06 | Disposition: A | Discharge status: Home / Self Care | Source: Ambulatory Visit | Attending: Radiation Oncology | Admitting: Radiation Oncology

## 2024-01-06 ENCOUNTER — Ambulatory Visit: Admitting: Radiation Oncology

## 2024-01-06 ENCOUNTER — Other Ambulatory Visit: Payer: Self-pay

## 2024-01-06 DIAGNOSIS — Z510 Encounter for antineoplastic radiation therapy: Secondary | ICD-10-CM | POA: Diagnosis not present

## 2024-01-06 LAB — RAD ONC ARIA SESSION SUMMARY
Course Elapsed Days: 32
Plan Fractions Treated to Date: 24
Plan Fractions Treated to Date: 24
Plan Prescribed Dose Per Fraction: 1.8 Gy
Plan Prescribed Dose Per Fraction: 1.8 Gy
Plan Total Fractions Prescribed: 28
Plan Total Fractions Prescribed: 28
Plan Total Prescribed Dose: 50.4 Gy
Plan Total Prescribed Dose: 50.4 Gy
Reference Point Dosage Given to Date: 43.2 Gy
Reference Point Dosage Given to Date: 43.2 Gy
Reference Point Session Dosage Given: 1.8 Gy
Reference Point Session Dosage Given: 1.8 Gy
Session Number: 24

## 2024-01-10 ENCOUNTER — Ambulatory Visit
Admission: RE | Admit: 2024-01-10 | Discharge: 2024-01-10 | Disposition: A | Discharge status: Home / Self Care | Source: Ambulatory Visit | Attending: Radiation Oncology | Admitting: Radiation Oncology

## 2024-01-10 ENCOUNTER — Other Ambulatory Visit: Payer: Self-pay

## 2024-01-10 DIAGNOSIS — Z170 Estrogen receptor positive status [ER+]: Secondary | ICD-10-CM | POA: Insufficient documentation

## 2024-01-10 DIAGNOSIS — Z510 Encounter for antineoplastic radiation therapy: Secondary | ICD-10-CM | POA: Insufficient documentation

## 2024-01-10 DIAGNOSIS — C50411 Malignant neoplasm of upper-outer quadrant of right female breast: Secondary | ICD-10-CM | POA: Insufficient documentation

## 2024-01-10 LAB — RAD ONC ARIA SESSION SUMMARY
Course Elapsed Days: 36
Plan Fractions Treated to Date: 25
Plan Fractions Treated to Date: 25
Plan Prescribed Dose Per Fraction: 1.8 Gy
Plan Prescribed Dose Per Fraction: 1.8 Gy
Plan Total Fractions Prescribed: 28
Plan Total Fractions Prescribed: 28
Plan Total Prescribed Dose: 50.4 Gy
Plan Total Prescribed Dose: 50.4 Gy
Reference Point Dosage Given to Date: 45 Gy
Reference Point Dosage Given to Date: 45 Gy
Reference Point Session Dosage Given: 1.8 Gy
Reference Point Session Dosage Given: 1.8 Gy
Session Number: 25

## 2024-01-11 ENCOUNTER — Other Ambulatory Visit: Payer: Self-pay

## 2024-01-11 ENCOUNTER — Ambulatory Visit (HOSPITAL_COMMUNITY): Admit: 2024-01-11 | Admitting: General Surgery

## 2024-01-11 ENCOUNTER — Ambulatory Visit
Admission: RE | Admit: 2024-01-11 | Discharge: 2024-01-11 | Disposition: A | Discharge status: Home / Self Care | Source: Ambulatory Visit | Attending: Radiation Oncology | Admitting: Radiation Oncology

## 2024-01-11 DIAGNOSIS — Z510 Encounter for antineoplastic radiation therapy: Secondary | ICD-10-CM | POA: Diagnosis not present

## 2024-01-11 LAB — RAD ONC ARIA SESSION SUMMARY
Course Elapsed Days: 37
Plan Fractions Treated to Date: 26
Plan Fractions Treated to Date: 26
Plan Prescribed Dose Per Fraction: 1.8 Gy
Plan Prescribed Dose Per Fraction: 1.8 Gy
Plan Total Fractions Prescribed: 28
Plan Total Fractions Prescribed: 28
Plan Total Prescribed Dose: 50.4 Gy
Plan Total Prescribed Dose: 50.4 Gy
Reference Point Dosage Given to Date: 46.8 Gy
Reference Point Dosage Given to Date: 46.8 Gy
Reference Point Session Dosage Given: 1.8 Gy
Reference Point Session Dosage Given: 1.8 Gy
Session Number: 26

## 2024-01-11 SURGERY — REMOVAL PORT-A-CATH
Anesthesia: Monitor Anesthesia Care

## 2024-01-12 ENCOUNTER — Ambulatory Visit
Admission: RE | Admit: 2024-01-12 | Discharge: 2024-01-12 | Disposition: A | Discharge status: Home / Self Care | Source: Ambulatory Visit | Attending: Radiation Oncology | Admitting: Radiation Oncology

## 2024-01-12 ENCOUNTER — Other Ambulatory Visit: Payer: Self-pay

## 2024-01-12 DIAGNOSIS — Z510 Encounter for antineoplastic radiation therapy: Secondary | ICD-10-CM | POA: Diagnosis not present

## 2024-01-12 LAB — RAD ONC ARIA SESSION SUMMARY
Course Elapsed Days: 38
Plan Fractions Treated to Date: 27
Plan Fractions Treated to Date: 27
Plan Prescribed Dose Per Fraction: 1.8 Gy
Plan Prescribed Dose Per Fraction: 1.8 Gy
Plan Total Fractions Prescribed: 28
Plan Total Fractions Prescribed: 28
Plan Total Prescribed Dose: 50.4 Gy
Plan Total Prescribed Dose: 50.4 Gy
Reference Point Dosage Given to Date: 48.6 Gy
Reference Point Dosage Given to Date: 48.6 Gy
Reference Point Session Dosage Given: 1.8 Gy
Reference Point Session Dosage Given: 1.8 Gy
Session Number: 27

## 2024-01-13 ENCOUNTER — Ambulatory Visit
Admission: RE | Admit: 2024-01-13 | Discharge: 2024-01-13 | Disposition: A | Discharge status: Home / Self Care | Source: Ambulatory Visit | Attending: Radiation Oncology | Admitting: Radiation Oncology

## 2024-01-13 ENCOUNTER — Other Ambulatory Visit: Payer: Self-pay

## 2024-01-13 ENCOUNTER — Ambulatory Visit

## 2024-01-13 DIAGNOSIS — Z510 Encounter for antineoplastic radiation therapy: Secondary | ICD-10-CM | POA: Diagnosis not present

## 2024-01-13 LAB — RAD ONC ARIA SESSION SUMMARY
Course Elapsed Days: 39
Plan Fractions Treated to Date: 28
Plan Fractions Treated to Date: 28
Plan Prescribed Dose Per Fraction: 1.8 Gy
Plan Prescribed Dose Per Fraction: 1.8 Gy
Plan Total Fractions Prescribed: 28
Plan Total Fractions Prescribed: 28
Plan Total Prescribed Dose: 50.4 Gy
Plan Total Prescribed Dose: 50.4 Gy
Reference Point Dosage Given to Date: 50.4 Gy
Reference Point Dosage Given to Date: 50.4 Gy
Reference Point Session Dosage Given: 1.8 Gy
Reference Point Session Dosage Given: 1.8 Gy
Session Number: 28

## 2024-01-16 ENCOUNTER — Other Ambulatory Visit: Payer: Self-pay

## 2024-01-16 ENCOUNTER — Ambulatory Visit

## 2024-01-16 ENCOUNTER — Ambulatory Visit
Admission: RE | Admit: 2024-01-16 | Discharge: 2024-01-16 | Disposition: A | Discharge status: Home / Self Care | Source: Ambulatory Visit | Attending: Radiation Oncology | Admitting: Radiation Oncology

## 2024-01-16 DIAGNOSIS — Z510 Encounter for antineoplastic radiation therapy: Secondary | ICD-10-CM | POA: Diagnosis not present

## 2024-01-16 LAB — RAD ONC ARIA SESSION SUMMARY
Course Elapsed Days: 42
Plan Fractions Treated to Date: 1
Plan Prescribed Dose Per Fraction: 2 Gy
Plan Total Fractions Prescribed: 5
Plan Total Prescribed Dose: 10 Gy
Reference Point Dosage Given to Date: 2 Gy
Reference Point Session Dosage Given: 2 Gy
Session Number: 29

## 2024-01-17 ENCOUNTER — Other Ambulatory Visit: Payer: Self-pay

## 2024-01-17 ENCOUNTER — Ambulatory Visit
Admission: RE | Admit: 2024-01-17 | Discharge: 2024-01-17 | Disposition: A | Discharge status: Home / Self Care | Source: Ambulatory Visit | Attending: Radiation Oncology | Admitting: Radiation Oncology

## 2024-01-17 DIAGNOSIS — Z510 Encounter for antineoplastic radiation therapy: Secondary | ICD-10-CM | POA: Diagnosis not present

## 2024-01-17 LAB — RAD ONC ARIA SESSION SUMMARY
Course Elapsed Days: 43
Plan Fractions Treated to Date: 2
Plan Prescribed Dose Per Fraction: 2 Gy
Plan Total Fractions Prescribed: 5
Plan Total Prescribed Dose: 10 Gy
Reference Point Dosage Given to Date: 4 Gy
Reference Point Session Dosage Given: 2 Gy
Session Number: 30

## 2024-01-18 ENCOUNTER — Other Ambulatory Visit: Payer: Self-pay | Admitting: General Surgery

## 2024-01-18 ENCOUNTER — Ambulatory Visit
Admission: RE | Admit: 2024-01-18 | Discharge: 2024-01-18 | Disposition: A | Discharge status: Home / Self Care | Source: Ambulatory Visit | Attending: Radiation Oncology | Admitting: Radiation Oncology

## 2024-01-18 ENCOUNTER — Other Ambulatory Visit: Payer: Self-pay

## 2024-01-18 DIAGNOSIS — Z170 Estrogen receptor positive status [ER+]: Secondary | ICD-10-CM

## 2024-01-18 DIAGNOSIS — Z510 Encounter for antineoplastic radiation therapy: Secondary | ICD-10-CM | POA: Diagnosis not present

## 2024-01-18 LAB — RAD ONC ARIA SESSION SUMMARY
Course Elapsed Days: 44
Plan Fractions Treated to Date: 3
Plan Prescribed Dose Per Fraction: 2 Gy
Plan Total Fractions Prescribed: 5
Plan Total Prescribed Dose: 10 Gy
Reference Point Dosage Given to Date: 6 Gy
Reference Point Session Dosage Given: 2 Gy
Session Number: 31

## 2024-01-19 ENCOUNTER — Ambulatory Visit
Admission: RE | Admit: 2024-01-19 | Discharge: 2024-01-19 | Disposition: A | Discharge status: Home / Self Care | Source: Ambulatory Visit | Attending: Radiation Oncology | Admitting: Radiation Oncology

## 2024-01-19 ENCOUNTER — Other Ambulatory Visit: Payer: Self-pay

## 2024-01-19 ENCOUNTER — Other Ambulatory Visit: Payer: Self-pay | Admitting: Hematology and Oncology

## 2024-01-19 ENCOUNTER — Telehealth: Payer: Self-pay | Admitting: *Deleted

## 2024-01-19 ENCOUNTER — Inpatient Hospital Stay

## 2024-01-19 ENCOUNTER — Ambulatory Visit

## 2024-01-19 DIAGNOSIS — Z1721 Progesterone receptor positive status: Secondary | ICD-10-CM | POA: Insufficient documentation

## 2024-01-19 DIAGNOSIS — Z170 Estrogen receptor positive status [ER+]: Secondary | ICD-10-CM | POA: Insufficient documentation

## 2024-01-19 DIAGNOSIS — F17210 Nicotine dependence, cigarettes, uncomplicated: Secondary | ICD-10-CM | POA: Insufficient documentation

## 2024-01-19 DIAGNOSIS — Z79811 Long term (current) use of aromatase inhibitors: Secondary | ICD-10-CM | POA: Insufficient documentation

## 2024-01-19 DIAGNOSIS — Z510 Encounter for antineoplastic radiation therapy: Secondary | ICD-10-CM | POA: Diagnosis not present

## 2024-01-19 DIAGNOSIS — C50411 Malignant neoplasm of upper-outer quadrant of right female breast: Secondary | ICD-10-CM | POA: Insufficient documentation

## 2024-01-19 DIAGNOSIS — Z923 Personal history of irradiation: Secondary | ICD-10-CM | POA: Insufficient documentation

## 2024-01-19 DIAGNOSIS — Z1732 Human epidermal growth factor receptor 2 negative status: Secondary | ICD-10-CM | POA: Insufficient documentation

## 2024-01-19 LAB — RAD ONC ARIA SESSION SUMMARY
Course Elapsed Days: 45
Plan Fractions Treated to Date: 4
Plan Prescribed Dose Per Fraction: 2 Gy
Plan Total Fractions Prescribed: 5
Plan Total Prescribed Dose: 10 Gy
Reference Point Dosage Given to Date: 8 Gy
Reference Point Session Dosage Given: 2 Gy
Session Number: 32

## 2024-01-19 NOTE — Telephone Encounter (Signed)
 Patient was present at [facility] today for flush appointment following her radiation treatment.  She states that she spoke with Dr Loretha at last office visit in August about receiving IVF's today however, no documentation on file as ordered.  Upon speaking to the patient over the phone to assess her symptoms she stated she was severely fatigued and had no desire to do anything.  I explained that this was an expected side effect of radiation and as I was beginning to ask if she had any other symptoms such as nausea/vomiting/diarrhea she terminated the call.  Notified Dr Loretha and radiation staff so that tomorrow during her under treat they can reassess and possibly offer [facility] for symptom management evaluation.

## 2024-01-19 NOTE — Progress Notes (Signed)
 Called the pt no answer, will try calling again.  Brittany [name]

## 2024-01-19 NOTE — Progress Notes (Signed)
 I tried calling Brittany [name] twice to assess reason for fatigue, No answer.

## 2024-01-20 ENCOUNTER — Other Ambulatory Visit: Payer: Self-pay

## 2024-01-20 ENCOUNTER — Ambulatory Visit
Admission: RE | Admit: 2024-01-20 | Discharge: 2024-01-20 | Disposition: A | Discharge status: Home / Self Care | Source: Ambulatory Visit | Attending: Radiation Oncology | Admitting: Radiation Oncology

## 2024-01-20 ENCOUNTER — Ambulatory Visit
Admission: RE | Admit: 2024-01-20 | Discharge: 2024-01-20 | Disposition: A | Discharge status: Home / Self Care | Source: Ambulatory Visit | Attending: Radiation Oncology

## 2024-01-20 DIAGNOSIS — Z510 Encounter for antineoplastic radiation therapy: Secondary | ICD-10-CM | POA: Diagnosis not present

## 2024-01-20 LAB — RAD ONC ARIA SESSION SUMMARY
Course Elapsed Days: 46
Plan Fractions Treated to Date: 5
Plan Prescribed Dose Per Fraction: 2 Gy
Plan Total Fractions Prescribed: 5
Plan Total Prescribed Dose: 10 Gy
Reference Point Dosage Given to Date: 10 Gy
Reference Point Session Dosage Given: 2 Gy
Session Number: 33

## 2024-01-24 NOTE — Radiation Completion Notes (Addendum)
  Radiation Oncology         [phone] ________________________________  Name: Brittany [name] MRN: [id number]  Date of Service: 01/20/2024  DOB: 1955-10-13  End of Treatment Note   Diagnosis: Stage IB, pT1cN2aM0, grade 2 ER/PR positive invasive lobular carcinoma of the right breast.    Intent: Curative     ==========DELIVERED PLANS==========  First Treatment Date: 2023-12-05 Last Treatment Date: 2024-01-20   Plan Name: Breast_R Site: Breast, Right Technique: 3D Mode: Photon Dose Per Fraction: 1.8 Gy Prescribed Dose (Delivered / Prescribed): 50.4 Gy / 50.4 Gy Prescribed Fxs (Delivered / Prescribed): 28 / 28   Plan Name: Breast_R_SCV Site: Breast, Right Technique: 3D Mode: Photon Dose Per Fraction: 1.8 Gy Prescribed Dose (Delivered / Prescribed): 50.4 Gy / 50.4 Gy Prescribed Fxs (Delivered / Prescribed): 28 / 28   Plan Name: Breast_R_Bst Site: Breast, Right Technique: 3D Mode: Photon Dose Per Fraction: 2 Gy Prescribed Dose (Delivered / Prescribed): 10 Gy / 10 Gy Prescribed Fxs (Delivered / Prescribed): 5 / 5     ==========ON TREATMENT VISIT DATES========== 2023-12-09, 2023-12-15, 2023-12-23, 2023-12-30, 2024-01-06, 2024-01-13, 2024-01-20    See weekly On Treatment Notes in Epic for details in the Media tab (listed as Progress notes on the On Treatment Visit Dates listed above). The patient tolerated radiation. She developed fatigue and anticipated skin changes in the treatment field.   The patient will receive a call in about one month from the radiation oncology department. She will continue follow up with Dr. Loretha as well.      Donald KYM Husband, PAC

## 2024-01-25 ENCOUNTER — Telehealth: Payer: Self-pay | Admitting: Adult Health

## 2024-01-25 NOTE — Telephone Encounter (Signed)
 Called and LVM regarding appt

## 2024-01-26 ENCOUNTER — Other Ambulatory Visit: Payer: Self-pay

## 2024-01-26 ENCOUNTER — Ambulatory Visit: Payer: Self-pay | Admitting: Nurse Practitioner

## 2024-01-26 ENCOUNTER — Ambulatory Visit: Payer: Medicare Other | Admitting: Nurse Practitioner

## 2024-01-26 ENCOUNTER — Ambulatory Visit (INDEPENDENT_AMBULATORY_CARE_PROVIDER_SITE_OTHER)

## 2024-01-26 VITALS — BP 100/80 | HR 86 | Ht 64.5 in | Wt 173.2 lb

## 2024-01-26 DIAGNOSIS — E559 Vitamin D deficiency, unspecified: Secondary | ICD-10-CM

## 2024-01-26 DIAGNOSIS — R300 Dysuria: Secondary | ICD-10-CM

## 2024-01-26 DIAGNOSIS — Z0000 Encounter for general adult medical examination without abnormal findings: Secondary | ICD-10-CM | POA: Diagnosis not present

## 2024-01-26 DIAGNOSIS — E785 Hyperlipidemia, unspecified: Secondary | ICD-10-CM | POA: Diagnosis not present

## 2024-01-26 DIAGNOSIS — Z0001 Encounter for general adult medical examination with abnormal findings: Secondary | ICD-10-CM

## 2024-01-26 DIAGNOSIS — F419 Anxiety disorder, unspecified: Secondary | ICD-10-CM | POA: Diagnosis not present

## 2024-01-26 DIAGNOSIS — E663 Overweight: Secondary | ICD-10-CM | POA: Diagnosis not present

## 2024-01-26 DIAGNOSIS — Z13820 Encounter for screening for osteoporosis: Secondary | ICD-10-CM

## 2024-01-26 DIAGNOSIS — F32A Depression, unspecified: Secondary | ICD-10-CM

## 2024-01-26 DIAGNOSIS — R5383 Other fatigue: Secondary | ICD-10-CM | POA: Diagnosis not present

## 2024-01-26 DIAGNOSIS — R7303 Prediabetes: Secondary | ICD-10-CM

## 2024-01-26 DIAGNOSIS — Z780 Asymptomatic menopausal state: Secondary | ICD-10-CM

## 2024-01-26 LAB — CBC
HCT: 40.4 % (ref 36.0–46.0)
Hemoglobin: 13.5 g/dL (ref 12.0–15.0)
MCHC: 33.4 g/dL (ref 30.0–36.0)
MCV: 96.5 fl (ref 78.0–100.0)
Platelets: 285 K/uL (ref 150.0–400.0)
RBC: 4.18 Mil/uL (ref 3.87–5.11)
RDW: 14.2 % (ref 11.5–15.5)
WBC: 5.1 K/uL (ref 4.0–10.5)

## 2024-01-26 LAB — LIPID PANEL
Cholesterol: 215 mg/dL — ABNORMAL HIGH (ref 0–200)
HDL: 55.9 mg/dL (ref >39.00)
LDL Cholesterol: 108 mg/dL — ABNORMAL HIGH (ref 0–99)
NonHDL: 159.3
Total CHOL/HDL Ratio: 4
Triglycerides: 258 mg/dL — ABNORMAL HIGH (ref 0.0–149.0)
VLDL: 51.6 mg/dL — ABNORMAL HIGH (ref 0.0–40.0)

## 2024-01-26 LAB — URINALYSIS, ROUTINE W REFLEX MICROSCOPIC
Bilirubin Urine: NEGATIVE
Hgb urine dipstick: NEGATIVE
Ketones, ur: NEGATIVE
Leukocytes,Ua: NEGATIVE
Nitrite: NEGATIVE
Specific Gravity, Urine: 1.025 (ref 1.000–1.030)
Total Protein, Urine: NEGATIVE
Urine Glucose: NEGATIVE
Urobilinogen, UA: 0.2 (ref 0.0–1.0)
pH: 6 (ref 5.0–8.0)

## 2024-01-26 LAB — TSH: TSH: 0.9 u[IU]/mL (ref 0.35–5.50)

## 2024-01-26 LAB — COMPREHENSIVE METABOLIC PANEL WITH GFR
ALT: 31 U/L (ref 0–35)
AST: 26 U/L (ref 0–37)
Albumin: 4.4 g/dL (ref 3.5–5.2)
Alkaline Phosphatase: 74 U/L (ref 39–117)
BUN: 12 mg/dL (ref 6–23)
CO2: 28 meq/L (ref 19–32)
Calcium: 9.6 mg/dL (ref 8.4–10.5)
Chloride: 105 meq/L (ref 96–112)
Creatinine, Ser: 0.86 mg/dL (ref 0.40–1.20)
GFR: 70.36 mL/min (ref >60.00)
Glucose, Bld: 86 mg/dL (ref 70–99)
Potassium: 4.3 meq/L (ref 3.5–5.1)
Sodium: 139 meq/L (ref 135–145)
Total Bilirubin: 0.5 mg/dL (ref 0.2–1.2)
Total Protein: 6.7 g/dL (ref 6.0–8.3)

## 2024-01-26 LAB — VITAMIN D 25 HYDROXY (VIT D DEFICIENCY, FRACTURES): VITD: 25.52 ng/mL — ABNORMAL LOW (ref 30.00–100.00)

## 2024-01-26 LAB — VITAMIN B12: Vitamin B-12: 743 pg/mL (ref 211–911)

## 2024-01-26 LAB — HEMOGLOBIN A1C: Hgb A1c MFr Bld: 5.8 % (ref 4.6–6.5)

## 2024-01-26 NOTE — Assessment & Plan Note (Signed)
  Vitamin D  deficiency Current vitamin D  status unknown. - Check serum vitamin D  level.

## 2024-01-26 NOTE — Assessment & Plan Note (Signed)
 Health Maintenance -Plan to have flu and covid vaccine administered in near future -Discussed health screenings -Continue to follow-up with specialists especially oncology  -Labs ordered, further recommendations may be made based upon his results

## 2024-01-26 NOTE — Assessment & Plan Note (Signed)
 History of prediabetes - Order A1c test.

## 2024-01-26 NOTE — Assessment & Plan Note (Signed)
 Fatigue following cancer treatment Significant fatigue likely related to recent cancer treatment. - Order complete blood cell count, kidney and liver function tests, electrolytes, and thyroid  function tests to evaluate fatigue.

## 2024-01-26 NOTE — Assessment & Plan Note (Signed)
 Labs ordered, further recommendations may be made based upon his results.

## 2024-01-26 NOTE — Progress Notes (Signed)
 Established Patient Office Visit  Subjective   Patient ID: Brittany [name], female    DOB: 02-20-1956  Age: 68 y.o. MRN: [id number]  Chief Complaint  Patient presents with   Annual Exam    Discussed the use of AI scribe software for clinical note transcription with the patient, who gave verbal consent to proceed.  History of Present Illness Brittany [name] is a 68 year old female who presents for a routine physical exam and lab work.  Health Maintenance: due for flu shot but wants to wait until next month to have this administered. Is considering getting a covid 19 booster. Up-to-date on mammogram (plans to have next mammogram next month), colonoscopy due 05/2027, tetanus vaccine due 01/2026, due for dexa scan.  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 during cancer treatment - Plans to attend physical therapy for management - Occasionally wears a compression sleeve  Respiratory symptoms - Dry cough onset around the time of radiation treatment - No chest pain, shortness of breath, or other respiratory symptoms  Mood and sleep - Takes Paxil  10 mg daily for mood stabilization - Mood generally stable with occasional periods of feeling down - No unexplained fevers or chills - No significant changes in sleep pattern  Metabolic risk factors - Prediabetes and hyperlipidemia not currently treated with medication - Takes vitamin B and vitamin D  supplements - Adjusts vitamin D  intake based on sun exposure  Genitourinary symptoms - Intermittent dysuria - Requests urine testing      Review of Systems  Constitutional:  Positive for malaise/fatigue. Negative for chills and fever.  Respiratory:   Positive for cough. Negative for shortness of breath.   Cardiovascular:  Negative for chest pain and palpitations.  Gastrointestinal:  Negative for abdominal pain and blood in stool.  Genitourinary:  Negative for hematuria.  Psychiatric/Behavioral:  Negative for depression. The patient is not nervous/anxious.       Objective:     BP 100/80   Pulse 86   Ht 5' 4.5 (1.638 m)   Wt 173 lb 3 oz (78.6 kg)   SpO2 99%   BMI 29.27 kg/m  BP Readings from Last 3 Encounters:  01/26/24 100/80  01/26/24 100/80  12/23/23 110/74   Wt Readings from Last 3 Encounters:  01/26/24 173 lb 3 oz (78.6 kg)  01/26/24 173 lb 3.2 oz (78.6 kg)  12/23/23 175 lb (79.4 kg)      Physical Exam Vitals reviewed.  Constitutional:      Appearance: Normal appearance.  HENT:     Head: Normocephalic and atraumatic.     Right Ear: Tympanic membrane, ear canal and external ear normal.     Left Ear: Tympanic membrane, ear canal and external ear normal.  Eyes:     General:        Right eye: No discharge.        Left eye: No discharge.     Extraocular Movements: Extraocular movements intact.     Conjunctiva/sclera: Conjunctivae normal.     Pupils: Pupils are equal, round, and reactive to light.  Neck:     Vascular: No carotid bruit.  Cardiovascular:     Rate and Rhythm: Normal rate and regular rhythm.     Pulses: Normal pulses.  Heart sounds: Normal heart sounds. No murmur heard. Pulmonary:     Effort: Pulmonary effort is normal.     Breath sounds: Normal breath sounds.  Abdominal:     General: Abdomen is flat. Bowel sounds are normal. There is no distension.     Palpations: Abdomen is soft. There is no mass.     Tenderness: There is no abdominal tenderness.  Musculoskeletal:        General: No tenderness.     Cervical back: Neck supple. No muscular tenderness.     Right lower leg: No edema.     Left lower leg: No edema.  Lymphadenopathy:     Cervical: No cervical adenopathy.     Upper Body:      Right upper body: No supraclavicular adenopathy.     Left upper body: No supraclavicular adenopathy.  Skin:    General: Skin is warm and dry.  Neurological:     General: No focal deficit present.     Mental Status: She is alert and oriented to person, place, and time.     Motor: No weakness.     Gait: Gait normal.  Psychiatric:        Mood and Affect: Mood normal.        Behavior: Behavior normal.        Judgment: Judgment normal.      No results found for any visits on 01/26/24.    The 10-year ASCVD risk score (Arnett DK, et al., 2019) is: 3.6%    Assessment & Plan:   Problem List Items Addressed This Visit       Other   Anxiety and depression   Anxiety and depression, stable on current therapy Anxiety and depression well-managed on Paxil  10 mg daily. - Continue Paxil  10 mg oral daily.      Vitamin D  deficiency    Vitamin D  deficiency Current vitamin D  status unknown. - Check serum vitamin D  level.      Relevant Orders   CBC   Comprehensive metabolic panel with GFR   Hemoglobin A1c   Lipid panel   TSH   VITAMIN D  25 Hydroxy (Vit-D Deficiency, Fractures)   Vitamin B12   Encounter for general adult medical examination with abnormal findings   Health Maintenance -Plan to have flu and covid vaccine administered in near future -Discussed health screenings -Continue to follow-up with specialists especially oncology  -Labs ordered, further recommendations may be made based upon his results      Relevant Orders   CBC   Comprehensive metabolic panel with GFR   Hemoglobin A1c   Lipid panel   TSH   VITAMIN D  25 Hydroxy (Vit-D Deficiency, Fractures)   Vitamin B12   Overweight (BMI 25.0-29.9)   Labs ordered, further recommendations may be made based upon his results       Relevant Orders   CBC   Comprehensive metabolic panel with GFR   Hemoglobin A1c   Lipid panel   TSH   VITAMIN D  25 Hydroxy (Vit-D Deficiency, Fractures)   Vitamin B12    Hyperlipidemia   Hyperlipidemia Not currently on cholesterol-lowering medication. - Order lipid panel.      Relevant Orders   CBC   Comprehensive metabolic panel with GFR   Hemoglobin A1c   Lipid panel   TSH   VITAMIN D  25 Hydroxy (Vit-D Deficiency, Fractures)   Vitamin B12   Prediabetes   History of prediabetes - Order A1c test.      Relevant  Orders   CBC   Comprehensive metabolic panel with GFR   Hemoglobin A1c   Lipid panel   TSH   VITAMIN D  25 Hydroxy (Vit-D Deficiency, Fractures)   Vitamin B12   Fatigue   Fatigue following cancer treatment Significant fatigue likely related to recent cancer treatment. - Order complete blood cell count, kidney and liver function tests, electrolytes, and thyroid  function tests to evaluate fatigue.      Relevant Orders   CBC   Comprehensive metabolic panel with GFR   Hemoglobin A1c   Lipid panel   TSH   VITAMIN D  25 Hydroxy (Vit-D Deficiency, Fractures)   Vitamin B12   Osteoporosis screening - Primary   DEXA scan ordered      Relevant Orders   CBC   Comprehensive metabolic panel with GFR   Hemoglobin A1c   Lipid panel   TSH   VITAMIN D  25 Hydroxy (Vit-D Deficiency, Fractures)   Vitamin B12   DG Bone Density   Dysuria   Intermittent dysuria Reports intermittent dysuria. - Order urinalysis and urine culture.      Relevant Orders   Urinalysis, Routine w reflex microscopic   Urine Culture   Other Visit Diagnoses       Menopause       Relevant Orders   DG Bone Density      Assessment and Plan Assessment & Plan  Health Maintenance -Plan to have flu and covid vaccine administered in near future -Discussed health screenings -Continue to follow-up with specialists especially oncology  -Labs ordered, further recommendations may be made based upon his results   Breast cancer, status post chemotherapy and radiation, with planned port removal and ongoing surveillance Completed chemotherapy and radiation.  Scheduled for port removal. Transitioning to follow-up phase. - Proceed with port removal on October 1st. - Continue with regular oncology follow-up. - Plan for mammogram at the end of October.  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 Symptoms include fullness and occasional pain. - Continue with physical therapy for lymphedema management. - Wear compression sleeve as needed.  Fatigue following cancer treatment Significant fatigue likely related to recent cancer treatment. - Order complete blood cell count, kidney and liver function tests, electrolytes, and thyroid  function tests to evaluate fatigue.  Intermittent dysuria Reports intermittent dysuria. - Order urinalysis and urine culture.  Vitamin D  deficiency Current vitamin D  status unknown. - Check serum vitamin D  level.  Hyperlipidemia Not currently on cholesterol-lowering medication. - Order lipid panel.  History of prediabetes - Order A1c test.  Anxiety and depression, stable on current therapy Anxiety and depression well-managed on Paxil  10 mg daily. - Continue Paxil  10 mg oral daily.   In addition to annual physical exam I also performed and office visit as detailed above.   Return in about 6 months (around 07/25/2024) for F/U with Camauri Fleece.    [name] Pereyra, NP

## 2024-01-26 NOTE — Patient Instructions (Signed)
 Ms. Brittany [name],  Thank you for taking the time for your Medicare Wellness Visit. I appreciate your continued commitment to your health goals. Please review the care plan we discussed, and feel free to reach out if I can assist you further.  Medicare recommends these wellness visits once per year to help you and your care team stay ahead of potential health issues. These visits are designed to focus on prevention, allowing your provider to concentrate on managing your acute and chronic conditions during your regular appointments.  Please note that Annual Wellness Visits do not include a physical exam. Some assessments may be limited, especially if the visit was conducted virtually. If needed, we may recommend a separate in-person follow-up with your provider.  Ongoing Care Seeing your primary care provider every 3 to 6 months helps us  monitor your health and provide consistent, personalized care. Next office visit on 01/26/2024.  You are due for a Flu vaccine and can get that done at your local pharmacy.  Referrals If a referral was made during today's visit and you haven't received any updates within two weeks, please contact the referred provider directly to check on the status.  Recommended Screenings:  Health Maintenance  Topic Date Due   Flu Shot  12/09/2023   COVID-19 Vaccine (5 - 2025-26 season) 01/09/2024   Medicare Annual Wellness Visit  01/25/2025   DTaP/Tdap/Td vaccine (2 - Td or Tdap) 01/14/2026   Colon Cancer Screening  06/09/2027   Pneumococcal Vaccine for age over 32  Completed   DEXA scan (bone density measurement)  Completed   Hepatitis C Screening  Completed   Zoster (Shingles) Vaccine  Completed   HPV Vaccine  Aged Out   Meningitis B Vaccine  Aged Out   Breast Cancer Screening  Discontinued       01/26/2024   10:41 AM  Advanced Directives  Does Patient Have a Medical Advance Directive? No   Advance Care Planning is important because it: Ensures you receive  medical care that aligns with your values, goals, and preferences. Provides guidance to your family and loved ones, reducing the emotional burden of decision-making during critical moments.  Vision: Annual vision screenings are recommended for early detection of glaucoma, cataracts, and diabetic retinopathy. These exams can also reveal signs of chronic conditions such as diabetes and high blood pressure.  Dental: Annual dental screenings help detect early signs of oral cancer, gum disease, and other conditions linked to overall health, including heart disease and diabetes.  Please see the attached documents for additional preventive care recommendations.

## 2024-01-26 NOTE — Therapy (Signed)
 OUTPATIENT PHYSICAL THERAPY  UPPER EXTREMITY ONCOLOGY EVALUATION  Patient Name: Brittany [name] MRN: [id number] DOB:[birth year], 68 y.o., female Today's Date: 01/27/2024  END OF SESSION:  PT End of Session - 01/27/24 0943     Visit Number 1    Number of Visits 19    Date for Recertification  03/09/24    Authorization Type not needed    PT Start Time 0900    PT Stop Time 0940    PT Time Calculation (min) 40 min    Activity Tolerance Patient tolerated treatment well    Behavior During Therapy [facility] for tasks assessed/performed          Past Medical History:  Diagnosis Date   Anxiety and depression    Elevated LDL cholesterol level    Elevated liver enzymes 07/23/2021   GERD (gastroesophageal reflux disease)    Malignant neoplasm of breast (female) (HCC)    Thyroid  disorder 11/14/2017   Past Surgical History:  Procedure Laterality Date   AXILLARY LYMPH NODE DISSECTION Right 05/18/2023   Procedure: RIGHT AXILLARY LYMPH NODE DISSECTION;  Surgeon: Aron Shoulders, MD;  Location: [facility] OR;  Service: General;  Laterality: Right;  PEC BLOCK   BREAST BIOPSY Right 03/09/2023   US  RT BREAST BX W LOC DEV 1ST LESION IMG BX SPEC US  GUIDE 03/09/2023 [facility] MAMMOGRAPHY   BREAST BIOPSY  04/12/2023   US  RT RADIOACTIVE SEED LOC 04/12/2023 [facility] ULTRASOUND   BREAST LUMPECTOMY WITH RADIOACTIVE SEED AND SENTINEL LYMPH NODE BIOPSY Right 04/13/2023   Procedure: RIGHT BREAST SEED LOCALIZED LUMPECTOMY WITH SENTINEL NODE BIOPSY;  Surgeon: Aron Shoulders, MD;  Location: [facility];  Service: General;  Laterality: Right;   HERNIA REPAIR     LAPAROSCOPIC HYSTERECTOMY  03/2017   Partial, pt still has both adnexa   PORTACATH PLACEMENT N/A 05/18/2023   Procedure: PORT PLACEMENT WITH ULTRASOUND GUIDANCE;  Surgeon: Aron Shoulders, MD;  Location: [facility] OR;  Service: General;  Laterality: N/A;   Patient Active Problem List   Diagnosis Date Noted   Fatigue 01/26/2024   Osteoporosis screening 01/26/2024    Dysuria 01/26/2024   Port-A-Cath in place 05/31/2023   Genetic testing 03/28/2023   Family history of breast cancer 03/16/2023   Malignant neoplasm of upper-outer quadrant of right breast in female, estrogen receptor positive (HCC) 03/14/2023   Arthralgia of right knee 01/28/2023   Pneumococcal vaccination administered at current visit 01/28/2023   Decreased GFR 12/25/2021   Encounter for general adult medical examination with abnormal findings 07/23/2021   Overweight (BMI 25.0-29.9) 07/23/2021   Hyperlipidemia 07/23/2021   Prediabetes 07/23/2021   Ceruminosis, bilateral 02/18/2021   Vitamin D  deficiency 04/01/2020   Elevated LDL cholesterol level    Anxiety and depression 11/14/2017   Abnormal mammogram with microcalcification 11/14/2017   Thyroid  disorder 11/14/2017    PCP: Lauraine Pereyra, NP  REFERRING PROVIDER: Shoulders Aron, MD  REFERRING DIAG: Diagnosis C50.411 (ICD-10-CM) - Malignant neoplasm of upper-outer quadrant of right female breast Z17.0 (ICD-10-CM) - Estrogen receptor positive  THERAPY DIAG:  Aftercare following surgery for neoplasm  Malignant neoplasm of upper-outer quadrant of right breast in female, estrogen receptor positive (HCC)  Abnormal posture  ONSET DATE: 02/22/23  Rationale for Evaluation and Treatment: Rehabilitation  SUBJECTIVE:  SUBJECTIVE STATEMENT: I just finished radiation.  I think the arm is swelling .  I think the lower arm is the most swollen.  Does not have a glove or hand piece.  The axilla is not feeling swollen.  The breast isn't feeling swollen.    PERTINENT HISTORY: Patient was diagnosed on 02/22/2023 with right grade 2 invasive ductal carcinoma breast cancer. It measures 1.3 cm and is located in the upper outer quadrant. It is ER/PR positive and HER2  negative with a Ki67 of 5% . She is now s/p Right lumpectomy with SLNB and 4+/5 LN's on 04/13/2023. She  had an ALND on 05/17/2022 with 5+/13 LN'. Completed chemotherapy, and radiation.  PAIN:  Are you having pain? No a little bit last week not this week   PRECAUTIONS: lymphedema risk   RED FLAGS: None   WEIGHT BEARING RESTRICTIONS: No  FALLS:  Has patient fallen in last 6 months? No  LIVING ENVIRONMENT: Lives with: lives alone Lives in: House/apartment  OCCUPATION: retired  LEISURE: walking, yoga  HAND DOMINANCE: right   PRIOR LEVEL OF FUNCTION: Independent  PATIENT GOALS: decrease the swelling    OBJECTIVE: Note: Objective measures were completed at Evaluation unless otherwise noted.  COGNITION: Overall cognitive status: Within functional limits for tasks assessed   PALPATION: +1 pitting to the forearm but already very firm   OBSERVATIONS / OTHER ASSESSMENTS: Rt arm visibly larger than the Lt   UPPER EXTREMITY AROM/PROM: WNL - reports no issues  LYMPHEDEMA ASSESSMENTS:  LYMPHEDEMA ASSESSMENTS (in cm):    LANDMARK RIGHT   eval RIGHT 01/27/24  15 cm proximal to olecranon process   32.4  10 cm proximal to olecranon process 30.3 29.7 31.8  Olecranon process 24.5 24.6 27.2  15 cm proximal to ulnar styloid process   27.9  10 cm proximal to ulnar styloid process 23.2 23.4 24.1  Just proximal to ulnar styloid process 15.7 15.7 17.7  Across hand at thumb web space 19.2 20.3 21.3  At base of 2nd digit 6.4 6.4 7.0  (Blank rows = not tested)   LANDMARK LEFT   eval 01/27/24  15 cm proximal to olecranon process  32.1  10 cm proximal to olecranon process  31.7  Olecranon process  26.1  15 cm proximal to ulna styloid process  24.8  10 cm proximal to ulnar styloid process  21.7  Just proximal to ulnar styloid process  16.6  Across hand at thumb web space  20.2  At base of 2nd digit  7.0  (Blank rows = not tested)  Rt volume: Lt volume:  Difference of   L-DEX LYMPHEDEMA SCREENING: L-DEX LYMPHEDEMA SCREENING Measurement Type: Unilateral L-DEX MEASUREMENT EXTREMITY: Upper Extremity POSITION : Standing DOMINANT SIDE: Right At Risk Side: Right BASELINE SCORE (UNILATERAL): 2.1 L-DEX SCORE (UNILATERAL): 31.3 VALUE CHANGE (UNILAT): 29.2  TREATMENT DATE:  01/27/24 Eval performed Discussed findings and CDT:   Applied bandage: light to start:  eucerin lotion to arm, tg soft to the arm, artiflex from hand to axilla, 6cm hand bandage up to mid forearm, and then 10cm wrist to axilla.  Education on when to remove and bandage care.     PATIENT EDUCATION:  Education details: per today's note Person educated: Patient Education method: Chief Technology Officer Education comprehension: verbalized understanding  HOME EXERCISE PROGRAM:   ASSESSMENT:  CLINICAL IMPRESSION: Patient is a 68 y.o. female who was seen today for physical therapy evaluation and treatment for her Rt UE lymphedema.  She has now completed radiation and is experiencing more swelling.  Her SOZO is now out of the chart at 31.3 with a volume calculation difference of .  She is agreeable to starting CDT at this time and a light bandage was applied due to pt wearing a tighter long sleeve shirt.      OBJECTIVE IMPAIRMENTS: decreased knowledge of condition, decreased knowledge of use of DME, and increased edema.   ACTIVITY LIMITATIONS: none  PARTICIPATION LIMITATIONS: none  PERSONAL FACTORS: 1-2 comorbidities: ALND and radiation are also affecting patient's functional outcome.   REHAB POTENTIAL: Excellent  CLINICAL DECISION MAKING: Stable/uncomplicated  EVALUATION COMPLEXITY: Low  GOALS: Goals reviewed with patient? Yes  SHORT TERM GOALS: Target date: 02/22/24  Pt will be ind with self bandaging of the Rt arm to improve independence  with lymphedema care Baseline: Goal status: INITIAL   LONG TERM GOALS: Target date: 03/09/24  Pt will be ind with self MLD for lymphedema maintenance or pursue compression pump Baseline:  Goal status: INITIAL  2.  Pt will decrease volume in the limb by at least 50ml to decrease infection risk  Baseline:  Goal status: INITIAL  3.  Pt will be measured for or obtain final compression garments for lymphedema maintenance  Baseline:  Goal status: INITIAL   PLAN:  PT FREQUENCY: 2-3x per week   PT DURATION: 6 weeks  PLANNED INTERVENTIONS: 97110-Therapeutic exercises, 97530- Therapeutic activity, 97112- Neuromuscular re-education, 97535- Self Care, [id number]- Manual therapy, 97760- Orthotic Initial, [id number]- Prosthetic Initial , [id number]- Orthotic/Prosthetic subsequent, Patient/Family education, Manual lymph drainage, Therapeutic exercises, Therapeutic activity, Neuromuscular re-education, Gait training, and Self Care  PLAN FOR NEXT SESSION: start CDT   Larue Saddie SAUNDERS, PT 01/27/2024, 9:57 AM

## 2024-01-26 NOTE — Assessment & Plan Note (Signed)
 Intermittent dysuria Reports intermittent dysuria. - Order urinalysis and urine culture.

## 2024-01-26 NOTE — Assessment & Plan Note (Signed)
 -  DEXA scan ordered

## 2024-01-26 NOTE — Assessment & Plan Note (Signed)
 Anxiety and depression, stable on current therapy Anxiety and depression well-managed on Paxil  10 mg daily. - Continue Paxil  10 mg oral daily.

## 2024-01-26 NOTE — Progress Notes (Signed)
 Subjective:   Brittany [name] is a 68 y.o. who presents for a Medicare Wellness preventive visit.  As a reminder, Annual Wellness Visits don't include a physical exam, and some assessments may be limited, especially if this visit is performed virtually. We may recommend an in-person follow-up visit with your provider if needed.  Visit Complete: In person  Persons Participating in Visit: Patient.  AWV Questionnaire: Yes: Patient Medicare AWV questionnaire was completed by the patient on 01/22/2024; I have confirmed that all information answered by patient is correct and no changes since this date.  Cardiac Risk Factors include: none;dyslipidemia     Objective:    Today's Vitals   01/26/24 1054  BP: 100/80  Pulse: 86  SpO2: 99%  Weight: 173 lb 3.2 oz (78.6 kg)  Height: 5' 4.5 (1.638 m)   Body mass index is 29.27 kg/m.     01/26/2024   10:41 AM 12/23/2023   11:42 AM 11/15/2023    9:09 AM 10/28/2023    2:38 PM 09/08/2023    2:19 PM 08/25/2023    1:03 PM 08/11/2023    2:21 PM  Advanced Directives  Does Patient Have a Medical Advance Directive? No No No No No No No  Would patient like information on creating a medical advance directive?  No - Patient declined Yes (ED - Information included in AVS) No - Patient declined No - Patient declined No - Patient declined No - Patient declined    Current Medications (verified) Outpatient Encounter Medications as of 01/26/2024  Medication Sig   cycloSPORINE (RESTASIS) 0.05 % ophthalmic emulsion Place 1 drop into both eyes 2 (two) times daily.   EVENING PRIMROSE OIL PO Take 1 tablet by mouth daily.   Flaxseed, Linseed, (FLAX SEED OIL PO) Take 1 capsule by mouth daily.   glucosamine-chondroitin 500-400 MG tablet Take 1 tablet by mouth daily.   MAGNESIUM GLYCINATE PO Take 350 mg by mouth daily.   Methylsulfonylmethane (MSM) 1000 MG TABS Take 1 tablet by mouth 3 (three) times a week.   milk thistle 175 MG tablet Take 175 mg by mouth daily.    PARoxetine  (PAXIL ) 10 MG tablet TAKE ONE TABLET BY MOUTH ONCE A DAY   pyridoxine (B-6) 100 MG tablet Take 100 mg by mouth daily.   vitamin B-12 (CYANOCOBALAMIN) 500 MCG tablet Take 500 mcg by mouth daily.   VITAMIN D  PO Take 1 tablet by mouth daily.   lidocaine -prilocaine  (EMLA ) cream Apply to affected area once   prochlorperazine  (COMPAZINE ) 10 MG tablet Take 1 tablet (10 mg total) by mouth every 6 (six) hours as needed for nausea or vomiting.   vitamin C (ASCORBIC ACID) 250 MG tablet Take 250 mg by mouth daily.   No facility-administered encounter medications on file as of 01/26/2024.    Allergies (verified) Wellbutrin [bupropion]   History: Past Medical History:  Diagnosis Date   Anxiety and depression    Elevated LDL cholesterol level    Elevated liver enzymes 07/23/2021   GERD (gastroesophageal reflux disease)    Malignant neoplasm of breast (female) [facility])    Thyroid  disorder 11/14/2017   Past Surgical History:  Procedure Laterality Date   AXILLARY LYMPH NODE DISSECTION Right 05/18/2023   Procedure: RIGHT AXILLARY LYMPH NODE DISSECTION;  Surgeon: Aron Shoulders, MD;  Location: [facility] OR;  Service: General;  Laterality: Right;  PEC BLOCK   BREAST BIOPSY Right 03/09/2023   US  RT BREAST BX W LOC DEV 1ST LESION IMG BX SPEC US  GUIDE 03/09/2023  [facility] MAMMOGRAPHY   BREAST BIOPSY  04/12/2023   US  RT RADIOACTIVE SEED LOC 04/12/2023 [facility] ULTRASOUND   BREAST LUMPECTOMY WITH RADIOACTIVE SEED AND SENTINEL LYMPH NODE BIOPSY Right 04/13/2023   Procedure: RIGHT BREAST SEED LOCALIZED LUMPECTOMY WITH SENTINEL NODE BIOPSY;  Surgeon: Aron Shoulders, MD;  Location: [facility];  Service: General;  Laterality: Right;   HERNIA REPAIR     LAPAROSCOPIC HYSTERECTOMY  03/2017   Partial, pt still has both adnexa   PORTACATH PLACEMENT N/A 05/18/2023   Procedure: PORT PLACEMENT WITH ULTRASOUND GUIDANCE;  Surgeon: Aron Shoulders, MD;  Location: [facility] OR;  Service: General;  Laterality: N/A;   Family  History  Problem Relation Age of Onset   Breast cancer Mother 26   Diabetes Mother    Hypertension Mother    Cancer Father 35 - 21       liver?   Diabetes Brother    Cancer Paternal Uncle    Breast cancer Paternal Grandmother 79   Social History   Socioeconomic History   Marital status: Divorced    Spouse name: Not on file   Number of children: 1   Years of education: Not on file   Highest education level: Master's degree (e.g., MA, MS, MEng, MEd, MSW, MBA)  Occupational History   Occupation: RETIRED  Tobacco Use   Smoking status: Former   Smokeless tobacco: Never   Tobacco comments:    stopped at age 17. smoked for 4-5 years 1 ppd  Vaping Use   Vaping status: Never Used  Substance and Sexual Activity   Alcohol use: Yes    Comment: WINE OCC   Drug use: Never   Sexual activity: Not Currently    Partners: Male    Birth control/protection: Surgical    Comment: hysterectomy  Other Topics Concern   Not on file  Social History Narrative   Lives alone/2025   Social Drivers of Health   Financial Resource Strain: Low Risk  (01/22/2024)   Overall Financial Resource Strain (CARDIA)    Difficulty of Paying Living Expenses: Not very hard  Food Insecurity: No Food Insecurity (01/22/2024)   Hunger Vital Sign    Worried About Running Out of Food in the Last Year: Never true    Ran Out of Food in the Last Year: Never true  Transportation Needs: No Transportation Needs (01/22/2024)   PRAPARE - Administrator, Civil Service (Medical): No    Lack of Transportation (Non-Medical): No  Physical Activity: Insufficiently Active (01/22/2024)   Exercise Vital Sign    Days of Exercise per Week: 3 days    Minutes of Exercise per Session: 30 min  Stress: Stress Concern Present (01/22/2024)   [employer] of Occupational Health - Occupational Stress Questionnaire    Feeling of Stress: To some extent  Social Connections: Socially Isolated (01/22/2024)   Social Connection and  Isolation Panel    Frequency of Communication with Friends and Family: Three times a week    Frequency of Social Gatherings with Friends and Family: Twice a week    Attends Religious Services: Never    Database administrator or Organizations: No    Attends Engineer, structural: Not on file    Marital Status: Divorced    Tobacco Counseling Counseling given: Not Answered Tobacco comments: stopped at age 31. smoked for 4-5 years 1 ppd    Clinical Intake:  Pre-visit preparation completed: Yes  Pain : No/denies pain     Nutritional Risks:  None Diabetes: No  Lab Results  Component Value Date   HGBA1C 5.7 01/28/2023   HGBA1C 6.0 05/26/2021     How often do you need to have someone help you when you read instructions, pamphlets, or other written materials from your doctor or pharmacy?: 1 - Never  Interpreter Needed?: No  Information entered by :: Ankur Snowdon, RMA   Activities of Daily Living     01/22/2024    8:52 AM 05/13/2023   10:26 AM  In your present state of health, do you have any difficulty performing the following activities:  Hearing? 0   Vision? 0   Difficulty concentrating or making decisions? 0   Walking or climbing stairs? 0   Dressing or bathing? 0   Doing errands, shopping? 0 0  Preparing Food and eating ? N   Using the Toilet? N   In the past six months, have you accidently leaked urine? Y   Do you have problems with loss of bowel control? N   Managing your Medications? N   Managing your Finances? N   Housekeeping or managing your Housekeeping? N     Patient Care Team: Elnor Lauraine BRAVO, NP as PCP - General (Nurse Practitioner) Tyree Nanetta SAILOR, RN as Oncology Nurse Navigator Aron Shoulders, MD as Consulting Physician (General Surgery) Loretha Ash, MD as Consulting Physician (Hematology and Oncology) Dewey Rush, MD as Consulting Physician (Radiation Oncology)  I have updated your Care Teams any recent Medical Services you may have  received from other providers in the past year.     Assessment:   This is a routine wellness examination for Brittany [name].  Hearing/Vision screen Hearing Screening - Comments:: Denies hearing difficulties   Vision Screening - Comments:: Wears eyeglasses/[facility]   Goals Addressed               This Visit's Progress     Patient Stated (pt-stated)        Lose some weight/2025       Depression Screen     01/26/2024   10:43 AM 11/04/2023   10:34 AM 03/16/2023    1:12 PM 01/28/2023    9:58 AM 10/23/2021    8:02 AM 07/23/2021    3:53 PM 05/15/2021    1:46 PM  PHQ 2/9 Scores  PHQ - 2 Score 0 0 2 2 2 1 1   PHQ- 9 Score 0   7 10      Fall Risk     01/22/2024    8:52 AM 01/28/2023    9:58 AM 10/23/2021    8:02 AM 07/23/2021    3:53 PM 05/15/2021    1:46 PM  Fall Risk   Falls in the past year? 0 0 0 0 0  Number falls in past yr: 0 0 0  0  Injury with Fall? 0 0   0  Risk for fall due to :  No Fall Risks     Follow up Falls evaluation completed;Falls prevention discussed Falls evaluation completed       MEDICARE RISK AT HOME:  Medicare Risk at Home Any stairs in or around the home?: (Patient-Rptd) Yes If so, are there any without handrails?: (Patient-Rptd) No Home free of loose throw rugs in walkways, pet beds, electrical cords, etc?: (Patient-Rptd) No Adequate lighting in your home to reduce risk of falls?: (Patient-Rptd) Yes Life alert?: (Patient-Rptd) No Use of a cane, walker or w/c?: (Patient-Rptd) No Grab bars in the bathroom?: (Patient-Rptd) No Shower chair or  bench in shower?: (Patient-Rptd) Yes Elevated toilet seat or a handicapped toilet?: (Patient-Rptd) No  TIMED UP AND GO:  Was the test performed?  Yes  Length of time to ambulate 10 feet: 10 sec Gait steady and fast without use of assistive device  Cognitive Function: Declined/Normal: No cognitive concerns noted by patient or family. Patient alert, oriented, able to answer questions appropriately and recall  recent events. No signs of memory loss or confusion.        Immunizations Immunization History  Administered Date(s) Administered   Influenza,inj,Quad PF,6+ Mos 02/07/2017, 01/16/2018, 02/09/2019   Influenza-Unspecified 02/11/2012, 02/07/2017, 02/08/2020, 02/07/2021, 02/09/2022   Moderna Sars-Covid-2 Vaccination 06/21/2019, 07/24/2019, 02/29/2020   PNEUMOCOCCAL CONJUGATE-20 01/28/2023   Pneumococcal Polysaccharide-23 06/13/2014   Tdap 01/15/2016   Unspecified SARS-COV-2 Vaccination 12/09/2022   Zoster Recombinant(Shingrix ) 11/14/2017, 01/16/2018   Zoster, Live 06/13/2014    Screening Tests Health Maintenance  Topic Date Due   Influenza Vaccine  12/09/2023   COVID-19 Vaccine (5 - 2025-26 season) 01/09/2024   Medicare Annual Wellness (AWV)  01/25/2025   DTaP/Tdap/Td (2 - Td or Tdap) 01/14/2026   Colonoscopy  06/09/2027   Pneumococcal Vaccine: 50+ Years  Completed   DEXA SCAN  Completed   Hepatitis C Screening  Completed   Zoster Vaccines- Shingrix   Completed   HPV VACCINES  Aged Out   Meningococcal B Vaccine  Aged Out   Mammogram  Discontinued    Health Maintenance Items Addressed: See Nurse Notes at the end of this note  Additional Screening:  Vision Screening: Recommended annual ophthalmology exams for early detection of glaucoma and other disorders of the eye. Is the patient up to date with their annual eye exam?  No  Who is the provider or what is the name of the office in which the patient attends annual eye exams? [facility]  Dental Screening: Recommended annual dental exams for proper oral hygiene  Community Resource Referral / Chronic Care Management: CRR required this visit?  No   CCM required this visit?  No   Plan:    I have personally reviewed and noted the following in the patient's chart:   Medical and social history Use of alcohol, tobacco or illicit drugs  Current medications and supplements including opioid prescriptions. Patient is not  currently taking opioid prescriptions. Functional ability and status Nutritional status Physical activity Advanced directives List of other physicians Hospitalizations, surgeries, and ER visits in previous 12 months Vitals Screenings to include cognitive, depression, and falls Referrals and appointments  In addition, I have reviewed and discussed with patient certain preventive protocols, quality metrics, and best practice recommendations. A written personalized care plan for preventive services as well as general preventive health recommendations were provided to patient.   Herman Mell L Marcel Gary, CMA   01/26/2024   After Visit Summary: (MyChart) Due to this being a telephonic visit, the after visit summary with patients personalized plan was offered to patient via MyChart   Notes: Patient is due for a Flu vaccine but will like to wait until October to get.  Patient stated that she will schedule for a mammogram to be done next month.  Patient had no other concerns to address today.

## 2024-01-26 NOTE — Assessment & Plan Note (Signed)
 Hyperlipidemia Not currently on cholesterol-lowering medication. - Order lipid panel.

## 2024-01-27 ENCOUNTER — Other Ambulatory Visit: Payer: Self-pay

## 2024-01-27 ENCOUNTER — Encounter: Payer: Self-pay | Admitting: Rehabilitation

## 2024-01-27 ENCOUNTER — Ambulatory Visit: Attending: General Surgery | Admitting: Rehabilitation

## 2024-01-27 DIAGNOSIS — Z483 Aftercare following surgery for neoplasm: Secondary | ICD-10-CM | POA: Insufficient documentation

## 2024-01-27 DIAGNOSIS — C50411 Malignant neoplasm of upper-outer quadrant of right female breast: Secondary | ICD-10-CM | POA: Insufficient documentation

## 2024-01-27 DIAGNOSIS — R293 Abnormal posture: Secondary | ICD-10-CM | POA: Insufficient documentation

## 2024-01-27 DIAGNOSIS — Z170 Estrogen receptor positive status [ER+]: Secondary | ICD-10-CM | POA: Insufficient documentation

## 2024-01-27 LAB — URINE CULTURE: Result:: NO GROWTH

## 2024-01-27 NOTE — Patient Instructions (Addendum)
 PLEASE KEEP YOUR BANDAGES ON AS LONG AS POSSIBLE TO GET THE BEST SWELLING REDUCTION. Should your bandages become uncomfortable or feel too tight, follow these steps: Elevate your extremity higher than your heart.  Try to move your arm or leg joints against the firmness of the bandage to help with moving the fluid and allow the bandages to loosen a bit.  If the bandaging is still is too tight, it is ok to carefully remove the top layer.  There will still be more layers under it that can provide compression to your extremity. Finally, if you STILL have significant pain after trying these steps, it is ok to take the bandage off.  Check your skin carefully for any signs of irritation  PLEASE bring ALL bandage materials back to your next appointment as we will reuse what we can TAKE CARE OF YOUR BANDAGES SO THEY WILL LAST LONGER AND STAY IN BETTER CONDITION Washing bandages:  Wash periodically using a mild detergent in warm water.  Do not use fabric softener or bleach.  Place bandages in a mesh lingerie bag or in a tied off pillow case and use the gentle cycle of the washing machine or hand wash. If you hand wash, you may want to put them in the spin cycle of your washer to get the extra water out, but make sure you put them in a mesh bag first. Do not wring or stretch them while they are wet.  Drying bandages: Lay the bandages out smoothly on a towel away from direct sunlight or heating sources that can damage the fabric. Rolling bandages in a towel and gently squeezing the towel to remove excess water before laying them out can speed up the process.  If you use a drying rack, place a towel on top of the rack to lay the bandages on.  If they hang down to dry, they fabric could be stretched out and the bandage will lose its compression.   Or, keep bandages in the mesh bag and dry them in the dryer on the low or no heat cycle. Rolling bandages: Please roll your bandages after drying them so they are ready for  your next treatment. If they are rolled too loose, they will be difficult to apply.  If rolled too tight, they can get stretched out.   TAKE CARE OF YOUR SKIN Apply a low pH moisturizing lotion to your skin daily Avoid scratching your skin Treat skin irritations quickly  Know the 5 warning signs of infection: redness, pain, warmth to touch, fever and increased swelling.  Call your physician immediately if you notice any of these signs of a possible infection.  .crb

## 2024-01-30 ENCOUNTER — Telehealth: Payer: Self-pay

## 2024-01-30 ENCOUNTER — Ambulatory Visit

## 2024-01-30 ENCOUNTER — Encounter: Payer: Self-pay | Admitting: Hematology and Oncology

## 2024-01-30 DIAGNOSIS — R293 Abnormal posture: Secondary | ICD-10-CM

## 2024-01-30 DIAGNOSIS — Z170 Estrogen receptor positive status [ER+]: Secondary | ICD-10-CM

## 2024-01-30 DIAGNOSIS — Z483 Aftercare following surgery for neoplasm: Secondary | ICD-10-CM | POA: Diagnosis present

## 2024-01-30 DIAGNOSIS — C50411 Malignant neoplasm of upper-outer quadrant of right female breast: Secondary | ICD-10-CM | POA: Diagnosis present

## 2024-01-30 NOTE — Telephone Encounter (Signed)
 S/w pt by phone per MyChart message to offer MD visit 9/23 at 1400. Advised pt our team attempted to call her to schedule post xrt f/u with dr Loretha but we were unable to reach. Dr Loretha will discuss AI therapy tomorrow at 1400. She is agreeable. Appt made.

## 2024-01-30 NOTE — Therapy (Signed)
 OUTPATIENT PHYSICAL THERAPY  UPPER EXTREMITY ONCOLOGY EVALUATION  Patient Name: Brittany [name] MRN: [id number] DOB:[birth year], 68 y.o., female Today's Date: 01/30/2024  END OF SESSION:  PT End of Session - 01/30/24 0753     Visit Number 2    Number of Visits 19    Date for Recertification  03/09/24    Authorization Type not needed    PT Start Time 0755    PT Stop Time 0852    PT Time Calculation (min) 57 min    Activity Tolerance Patient tolerated treatment well    Behavior During Therapy [facility] for tasks assessed/performed          Past Medical History:  Diagnosis Date   Anxiety and depression    Elevated LDL cholesterol level    Elevated liver enzymes 07/23/2021   GERD (gastroesophageal reflux disease)    Malignant neoplasm of breast (female) (HCC)    Thyroid  disorder 11/14/2017   Past Surgical History:  Procedure Laterality Date   AXILLARY LYMPH NODE DISSECTION Right 05/18/2023   Procedure: RIGHT AXILLARY LYMPH NODE DISSECTION;  Surgeon: Aron Shoulders, MD;  Location: [facility];  Service: General;  Laterality: Right;  PEC BLOCK   BREAST BIOPSY Right 03/09/2023   US  RT BREAST BX W LOC DEV 1ST LESION IMG BX SPEC US  GUIDE 03/09/2023 [facility] MAMMOGRAPHY   BREAST BIOPSY  04/12/2023   US  RT RADIOACTIVE SEED LOC 04/12/2023 [facility] ULTRASOUND   BREAST LUMPECTOMY WITH RADIOACTIVE SEED AND SENTINEL LYMPH NODE BIOPSY Right 04/13/2023   Procedure: RIGHT BREAST SEED LOCALIZED LUMPECTOMY WITH SENTINEL NODE BIOPSY;  Surgeon: Aron Shoulders, MD;  Location: [facility];  Service: General;  Laterality: Right;   HERNIA REPAIR     LAPAROSCOPIC HYSTERECTOMY  03/2017   Partial, pt still has both adnexa   PORTACATH PLACEMENT N/A 05/18/2023   Procedure: PORT PLACEMENT WITH ULTRASOUND GUIDANCE;  Surgeon: Aron Shoulders, MD;  Location: [facility];  Service: General;  Laterality: N/A;   Patient Active Problem List   Diagnosis Date Noted   Fatigue 01/26/2024   Osteoporosis screening 01/26/2024    Dysuria 01/26/2024   Port-A-Cath in place 05/31/2023   Genetic testing 03/28/2023   Family history of breast cancer 03/16/2023   Malignant neoplasm of upper-outer quadrant of right breast in female, estrogen receptor positive (HCC) 03/14/2023   Arthralgia of right knee 01/28/2023   Pneumococcal vaccination administered at current visit 01/28/2023   Decreased GFR 12/25/2021   Encounter for general adult medical examination with abnormal findings 07/23/2021   Overweight (BMI 25.0-29.9) 07/23/2021   Hyperlipidemia 07/23/2021   Prediabetes 07/23/2021   Ceruminosis, bilateral 02/18/2021   Vitamin D  deficiency 04/01/2020   Elevated LDL cholesterol level    Anxiety and depression 11/14/2017   Abnormal mammogram with microcalcification 11/14/2017   Thyroid  disorder 11/14/2017    PCP: Lauraine Pereyra, NP  REFERRING PROVIDER: Shoulders Aron, MD  REFERRING DIAG: Diagnosis C50.411 (ICD-10-CM) - Malignant neoplasm of upper-outer quadrant of right female breast Z17.0 (ICD-10-CM) - Estrogen receptor positive  THERAPY DIAG:  Aftercare following surgery for neoplasm  Malignant neoplasm of upper-outer quadrant of right breast in female, estrogen receptor positive (HCC)  Abnormal posture  ONSET DATE: 02/22/23  Rationale for Evaluation and Treatment: Rehabilitation  SUBJECTIVE:  SUBJECTIVE STATEMENT:  The wrap was a pain in the butt. I hate that I can't wash my hands with the finger wrap on. I took it off yesterday about 2:00.   EVAL I just finished radiation.  I think the arm is swelling .  I think the lower arm is the most swollen.  Does not have a glove or hand piece.  The axilla is not feeling swollen.  The breast isn't feeling swollen.    PERTINENT HISTORY: Patient was diagnosed on 02/22/2023 with right grade  2 invasive ductal carcinoma breast cancer. It measures 1.3 cm and is located in the upper outer quadrant. It is ER/PR positive and HER2 negative with a Ki67 of 5% . She is now s/p Right lumpectomy with SLNB and 4+/5 LN's on 04/13/2023. She  had an ALND on 05/17/2022 with 5+/13 LN'. Completed chemotherapy, and radiation.  PAIN:  Are you having pain? No a little bit last week not this week   PRECAUTIONS: lymphedema risk   RED FLAGS: None   WEIGHT BEARING RESTRICTIONS: No  FALLS:  Has patient fallen in last 6 months? No  LIVING ENVIRONMENT: Lives with: lives alone Lives in: House/apartment  OCCUPATION: retired  LEISURE: walking, yoga  HAND DOMINANCE: right   PRIOR LEVEL OF FUNCTION: Independent  PATIENT GOALS: decrease the swelling    OBJECTIVE: Note: Objective measures were completed at Evaluation unless otherwise noted.  COGNITION: Overall cognitive status: Within functional limits for tasks assessed   PALPATION: +1 pitting to the forearm but already very firm   OBSERVATIONS / OTHER ASSESSMENTS: Rt arm visibly larger than the Lt   UPPER EXTREMITY AROM/PROM: WNL - reports no issues  LYMPHEDEMA ASSESSMENTS:  LYMPHEDEMA ASSESSMENTS (in cm):    LANDMARK RIGHT   eval RIGHT 01/27/24  15 cm proximal to olecranon process   32.4  10 cm proximal to olecranon process 30.3 29.7 31.8  Olecranon process 24.5 24.6 27.2  15 cm proximal to ulnar styloid process   27.9  10 cm proximal to ulnar styloid process 23.2 23.4 24.1  Just proximal to ulnar styloid process 15.7 15.7 17.7  Across hand at thumb web space 19.2 20.3 21.3  At base of 2nd digit 6.4 6.4 7.0  (Blank rows = not tested)   LANDMARK LEFT   eval 01/27/24  15 cm proximal to olecranon process  32.1  10 cm proximal to olecranon process  31.7  Olecranon process  26.1  15 cm proximal to ulna styloid process  24.8  10 cm proximal to ulnar styloid process  21.7  Just proximal to ulnar styloid process  16.6  Across hand at  thumb web space  20.2  At base of 2nd digit  7.0  (Blank rows = not tested)  Rt volume: Lt volume:  Difference of  L-DEX LYMPHEDEMA SCREENING: L-DEX LYMPHEDEMA SCREENING Measurement Type: Unilateral L-DEX MEASUREMENT EXTREMITY: Upper Extremity POSITION : Standing DOMINANT SIDE: Right At Risk Side: Right BASELINE SCORE (UNILATERAL): 2.1 L-DEX SCORE (UNILATERAL): 31.3 VALUE CHANGE (UNILAT): 29.2  TREATMENT DATE:   01/30/2024 Initiated MLD and instructed pt in workings of lymphatic system and why technique is so light. In supine: Short neck, superficial and deep abdominals, L axillary nodes and establishment of interaxillary pathway, R inguinal nodes and establishment of axilloinguinal pathway, then R UE working proximal to distal, moving inner upper arm outwards and upwards, and doing both sides of forearms, spending extra time in any areas of fibrosis then retracing all steps lotion to arm, tg soft to the arm, artiflex from hand to axilla, fingers 1-5  wrapped to PIP with mollelast 6cm hand bandage , and then 10cm wrist to axilla with X at elbow for first, and 2nd 10 cm wrist to axilla with TG soft pulled over.  Checked capillary refill and was good. Pt performed remedial exercises.    01/27/24 Eval performed Discussed findings and CDT:   Applied bandage: light to start:  eucerin lotion to arm, tg soft to the arm, artiflex from hand to axilla, 6cm hand bandage up to mid forearm, and then 10cm wrist to axilla.  Education on when to remove and bandage care.     PATIENT EDUCATION:  Education details: per today's note Person educated: Patient Education method: Chief Technology Officer Education comprehension: verbalized understanding  HOME EXERCISE PROGRAM:   ASSESSMENT:  CLINICAL IMPRESSION: Initiated MLD with instruction to pt ,  and continued compression bandaging using additional 10 cm wrap. Pt reported her arm looked good after reoving first bandage over the weekend     OBJECTIVE IMPAIRMENTS: decreased knowledge of condition, decreased knowledge of use of DME, and increased edema.   ACTIVITY LIMITATIONS: none  PARTICIPATION LIMITATIONS: none  PERSONAL FACTORS: 1-2 comorbidities: ALND and radiation are also affecting patient's functional outcome.   REHAB POTENTIAL: Excellent  CLINICAL DECISION MAKING: Stable/uncomplicated  EVALUATION COMPLEXITY: Low  GOALS: Goals reviewed with patient? Yes  SHORT TERM GOALS: Target date: 02/22/24  Pt will be ind with self bandaging of the Rt arm to improve independence with lymphedema care Baseline: Goal status: INITIAL   LONG TERM GOALS: Target date: 03/09/24  Pt will be ind with self MLD for lymphedema maintenance or pursue compression pump Baseline:  Goal status: INITIAL  2.  Pt will decrease volume in the limb by at least 50ml to decrease infection risk  Baseline:  Goal status: INITIAL  3.  Pt will be measured for or obtain final compression garments for lymphedema maintenance  Baseline:  Goal status: INITIAL   PLAN:  PT FREQUENCY: 2-3x per week   PT DURATION: 6 weeks  PLANNED INTERVENTIONS: 97110-Therapeutic exercises, 97530- Therapeutic activity, 97112- Neuromuscular re-education, 97535- Self Care, [id number]- Manual therapy, 97760- Orthotic Initial, [id number]- Prosthetic Initial , [id number]- Orthotic/Prosthetic subsequent, Patient/Family education, Manual lymph drainage, Therapeutic exercises, Therapeutic activity, Neuromuscular re-education, Gait training, and Self Care  PLAN FOR NEXT SESSION: start CDT   Grayce JINNY Sheldon, PT 01/30/2024, 8:59 AM

## 2024-01-31 ENCOUNTER — Inpatient Hospital Stay (HOSPITAL_BASED_OUTPATIENT_CLINIC_OR_DEPARTMENT_OTHER): Admitting: Hematology and Oncology

## 2024-01-31 VITALS — BP 114/67 | HR 96 | Temp 98.0°F | Resp 20 | Wt 176.9 lb

## 2024-01-31 DIAGNOSIS — Z170 Estrogen receptor positive status [ER+]: Secondary | ICD-10-CM | POA: Diagnosis not present

## 2024-01-31 DIAGNOSIS — C50411 Malignant neoplasm of upper-outer quadrant of right female breast: Secondary | ICD-10-CM

## 2024-01-31 DIAGNOSIS — Z510 Encounter for antineoplastic radiation therapy: Secondary | ICD-10-CM | POA: Diagnosis not present

## 2024-01-31 MED ORDER — ANASTROZOLE 1 MG PO TABS: 1.0000 mg | ORAL_TABLET | Freq: Every day | ORAL | 3 refills | Status: AC

## 2024-01-31 NOTE — Progress Notes (Signed)
 [facility] Cancer Follow up:    Brittany Lauraine BRAVO, NP [address] [place] [address]   DIAGNOSIS:  Cancer Staging  Malignant neoplasm of upper-outer quadrant of right breast in female, estrogen receptor positive (HCC) Staging form: Breast, AJCC 8th Edition - Pathologic stage from 05/31/2023: Stage IB (pT1c, pN2a, cM0, G2, ER+, PR+, HER2-) - Signed by Loretha Ash, MD on 05/31/2023 Stage prefix: Initial diagnosis Method of lymph node assessment: Axillary lymph node dissection Histologic grading system: 3 grade system    SUMMARY OF ONCOLOGIC HISTORY: Oncology History  Malignant neoplasm of upper-outer quadrant of right breast in female, estrogen receptor positive (HCC)  02/22/2023 Mammogram   Screening mammogram on February 22, 2023 showed possible mass in the right breast with calcifications warranting further evaluation.  No findings suspicious for malignancy in the left breast.  Diagnostic mammogram done confirmed a persistent irregular mass within the central right breast, adjacent to the mass are slightly heterogeneous calcifications.  Ultrasound showed 1.1 x 1.2 x 1.3 cm irregular hypoechoic mass at 10 o'clock position of the right breast 1 cm from the nipple, no abnormal right axillary lymph nodes are noted.   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  Prognostic showed ER 100% positive strong staining PR 95% positive strong staining Ki-67 of 5% and HER2 2+   03/14/2023 Initial Diagnosis   Malignant neoplasm of upper-outer quadrant of right breast in female, estrogen receptor positive (HCC)    Genetic Testing   [facility] CancerNext-Expanded Panel+RNA was Negative. Report date is 03/27/2023.   The CancerNext-Expanded gene panel offered by [facility] and includes sequencing, rearrangement, and RNA analysis for the  following 71 genes: AIP, ALK, APC, ATM, AXIN2, BAP1, BARD1, BMPR1A, BRCA1, BRCA2, BRIP1, CDC73, CDH1, CDK4, CDKN1B, CDKN2A, CHEK2, CTNNA1, DICER1, FH, FLCN, KIF1B, LZTR1, MAX, MEN1, MET, MLH1, MSH2, MSH3, MSH6, MUTYH, NF1, NF2, NTHL1, PALB2, PHOX2B, PMS2, POT1, PRKAR1A, PTCH1, PTEN, RAD51C, RAD51D, RB1, RET, SDHA, SDHAF2, SDHB, SDHC, SDHD, SMAD4, SMARCA4, SMARCB1, SMARCE1, STK11, SUFU, TMEM127, TP53, TSC1, TSC2, and VHL (sequencing and deletion/duplication); EGFR, EGLN1, HOXB13, KIT, MITF, PDGFRA, POLD1, and POLE (sequencing only); EPCAM and GREM1 (deletion/duplication only).    05/31/2023 Cancer Staging   Staging form: Breast, AJCC 8th Edition - Pathologic stage from 05/31/2023: Stage IB (pT1c, pN2a, cM0, G2, ER+, PR+, HER2-) - Signed by Loretha Ash, MD on 05/31/2023 Stage prefix: Initial diagnosis Method of lymph node assessment: Axillary lymph node dissection Histologic grading system: 3 grade system   06/09/2023 -  Chemotherapy   Patient is on Treatment Plan : BREAST DOSE DENSE AC q14d / PACLitaxel  q7d       CURRENT THERAPY: Radiation  INTERVAL HISTORY:  Discussed the use of AI scribe software for clinical note transcription with the patient, who gave verbal consent to proceed.  History of Present Illness Brittany [name] is a 68 year old female who presents for follow-up.   Brittany [name] is a 68 year old female with breast cancer who presents for follow-up after completing radiation therapy.  She feels better after completing radiation therapy for breast cancer, although it was more physically draining than chemotherapy. The radiation caused significant fatigue and a sensation of heat throughout her body. It also exacerbated her neuropathy symptoms, particularly in her toes, more than chemotherapy did.  She recently received a hydration injection with vitamins, including Vitamin  C, which she feels improved her overall well-being. She is considering regular treatments at a local  hydration therapy center.  She is preparing to start an anti-estrogen therapy pill to reduce estrogen levels. She has not experienced significant hot flashes in the past, but is aware of potential menopausal symptoms such as hot flashes and vaginal dryness.  She has a history of neuropathy, which she manages with ALA supplements. Her neuropathy symptoms are not currently bothersome.  She is scheduled for a bone density scan following a good result in 2022, and she continues to engage in physical activities like walking and weightlifting to maintain bone health. No significant hot flashes during menopause and no current bothersome neuropathy symptoms.   Rest of the pertinent 10 point ROS reviewed and neg.  Patient Active Problem List   Diagnosis Date Noted   Fatigue 01/26/2024   Osteoporosis screening 01/26/2024   Dysuria 01/26/2024   Port-A-Cath in place 05/31/2023   Genetic testing 03/28/2023   Family history of breast cancer 03/16/2023   Malignant neoplasm of upper-outer quadrant of right breast in female, estrogen receptor positive (HCC) 03/14/2023   Arthralgia of right knee 01/28/2023   Pneumococcal vaccination administered at current visit 01/28/2023   Decreased GFR 12/25/2021   Encounter for general adult medical examination with abnormal findings 07/23/2021   Overweight (BMI 25.0-29.9) 07/23/2021   Hyperlipidemia 07/23/2021   Prediabetes 07/23/2021   Ceruminosis, bilateral 02/18/2021   Vitamin D  deficiency 04/01/2020   Elevated LDL cholesterol level    Anxiety and depression 11/14/2017   Abnormal mammogram with microcalcification 11/14/2017   Thyroid  disorder 11/14/2017    is allergic to wellbutrin [bupropion].  MEDICAL HISTORY: Past Medical History:  Diagnosis Date   Anxiety and depression    Elevated LDL cholesterol level    Elevated liver enzymes 07/23/2021   GERD (gastroesophageal reflux disease)    Malignant neoplasm of breast (female) [facility])    Thyroid   disorder 11/14/2017    SURGICAL HISTORY: Past Surgical History:  Procedure Laterality Date   AXILLARY LYMPH NODE DISSECTION Right 05/18/2023   Procedure: RIGHT AXILLARY LYMPH NODE DISSECTION;  Surgeon: Aron Shoulders, MD;  Location[facility];  Service: General;  Laterality: Right;  PEC BLOCK   BREAST BIOPSY Right 03/09/2023   US  RT BREAST BX W LOC DEV 1ST LESION IMG BX SPEC US  GUIDE 03/09/2023 [facility] MAMMOGRAPHY   BREAST BIOPSY  04/12/2023   US  RT RADIOACTIVE SEED LOC 04/12/2023 [facility] ULTRASOUND   BREAST LUMPECTOMY WITH RADIOACTIVE SEED AND SENTINEL LYMPH NODE BIOPSY Right 04/13/2023   Procedure: RIGHT BREAST SEED LOCALIZED LUMPECTOMY WITH SENTINEL NODE BIOPSY;  Surgeon: Aron Shoulders, MD;  Location: [facility];  Service: General;  Laterality: Right;   HERNIA REPAIR     LAPAROSCOPIC HYSTERECTOMY  03/2017   Partial, pt still has both adnexa   PORTACATH PLACEMENT N/A 05/18/2023   Procedure: PORT PLACEMENT WITH ULTRASOUND GUIDANCE;  Surgeon: Aron Shoulders, MD;  Location: [facility];  Service: General;  Laterality: N/A;    SOCIAL HISTORY: Social History   Socioeconomic History   Marital status: Divorced    Spouse name: Not on file   Number of children: 1   Years of education: Not on file   Highest education level: Master's degree (e.g., MA, MS, MEng, MEd, MSW, MBA)  Occupational History   Occupation: RETIRED  Tobacco Use   Smoking status: Former   Smokeless tobacco: Never   Tobacco comments:    stopped at age 27. smoked for 4-5 years 1  ppd  Vaping Use   Vaping status: Never Used  Substance and Sexual Activity   Alcohol use: Yes    Comment: WINE OCC   Drug use: Never   Sexual activity: Not Currently    Partners: Male    Birth control/protection: Surgical    Comment: hysterectomy  Other Topics Concern   Not on file  Social History Narrative   Lives alone/2025   Social Drivers of Health   Financial Resource Strain: Low Risk  (01/22/2024)   Overall Financial Resource  Strain (CARDIA)    Difficulty of Paying Living Expenses: Not very hard  Food Insecurity: No Food Insecurity (01/22/2024)   Hunger Vital Sign    Worried About Running Out of Food in the Last Year: Never true    Ran Out of Food in the Last Year: Never true  Transportation Needs: No Transportation Needs (01/22/2024)   PRAPARE - Administrator, Civil Service (Medical): No    Lack of Transportation (Non-Medical): No  Physical Activity: Insufficiently Active (01/22/2024)   Exercise Vital Sign    Days of Exercise per Week: 3 days    Minutes of Exercise per Session: 30 min  Stress: Stress Concern Present (01/22/2024)   [employer] of Occupational Health - Occupational Stress Questionnaire    Feeling of Stress: To some extent  Social Connections: Socially Isolated (01/22/2024)   Social Connection and Isolation Panel    Frequency of Communication with Friends and Family: Three times a week    Frequency of Social Gatherings with Friends and Family: Twice a week    Attends Religious Services: Never    Database administrator or Organizations: No    Attends Engineer, structural: Not on file    Marital Status: Divorced  Intimate Partner Violence: Patient Unable To Answer (01/26/2024)   Humiliation, Afraid, Rape, and Kick questionnaire    Fear of Current or Ex-Partner: Patient unable to answer    Emotionally Abused: Patient unable to answer    Physically Abused: Patient unable to answer    Sexually Abused: Patient unable to answer    FAMILY HISTORY: Family History  Problem Relation Age of Onset   Breast cancer Mother 90   Diabetes Mother    Hypertension Mother    Cancer Father 76 - 55       liver?   Diabetes Brother    Cancer Paternal Uncle    Breast cancer Paternal Grandmother 9       PHYSICAL EXAMINATION   Onc Performance Status - 01/31/24 1400       KPS SCALE   KPS % SCORE Able to carry on normal activity, minor s/s of disease          Vitals:    01/31/24 1413  BP: 114/67  Pulse: 96  Resp: 20  Temp: 98 F (36.7 C)  SpO2: 96%   PE deferred in lieu of counseling  LABORATORY DATA:  CBC    Component Value Date/Time   WBC 5.1 01/26/2024 1200   RBC 4.18 01/26/2024 1200   HGB 13.5 01/26/2024 1200   HGB 12.3 11/04/2023 1007   HGB 13.4 04/01/2020 0912   HCT 40.4 01/26/2024 1200   HCT 39.0 04/01/2020 0912   PLT 285.0 01/26/2024 1200   PLT 306 11/04/2023 1007   PLT 331 04/01/2020 0912   MCV 96.5 01/26/2024 1200   MCV 93 04/01/2020 0912   MCH 33.1 11/04/2023 1007   MCHC 33.4 01/26/2024 1200   RDW 14.2  01/26/2024 1200   RDW 12.6 04/01/2020 0912   LYMPHSABS 1.6 11/04/2023 1007   LYMPHSABS 2.8 04/01/2020 0912   MONOABS 0.5 11/04/2023 1007   EOSABS 0.1 11/04/2023 1007   EOSABS 0.1 04/01/2020 0912   BASOSABS 0.0 11/04/2023 1007   BASOSABS 0.0 04/01/2020 0912    CMP     Component Value Date/Time   NA 139 01/26/2024 1200   NA 140 04/01/2020 0912   K 4.3 01/26/2024 1200   CL 105 01/26/2024 1200   CO2 28 01/26/2024 1200   GLUCOSE 86 01/26/2024 1200   BUN 12 01/26/2024 1200   BUN 10 04/01/2020 0912   CREATININE 0.86 01/26/2024 1200   CREATININE 0.77 11/04/2023 1007   CALCIUM 9.6 01/26/2024 1200   PROT 6.7 01/26/2024 1200   PROT 6.6 04/01/2020 0912   ALBUMIN 4.4 01/26/2024 1200   ALBUMIN 4.3 04/01/2020 0912   AST 26 01/26/2024 1200   AST 25 11/04/2023 1007   ALT 31 01/26/2024 1200   ALT 37 11/04/2023 1007   ALKPHOS 74 01/26/2024 1200   BILITOT 0.5 01/26/2024 1200   BILITOT 0.5 11/04/2023 1007   GFRNONAA >60 11/04/2023 1007   GFRAA 71 04/01/2020 0912     ASSESSMENT and THERAPY PLAN:    Assessment and Plan Assessment & Plan 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 Ready to start antiestrogen therapy.  - Initiate antiestrogen therapy, anastrozole  daily  - Monitor for menopausal symptoms and bone density changes. - Await bone  density scan results from primary care provider. - Plan to add Verzenio after one month   Surveillance for breast cancer recurrence Annual mammograms. Recommended guardant reveal for monitoring.  Chemotherapy-induced peripheral neuropathy Peripheral neuropathy present, managed with ALA infusions and oral supplements. Mild, ok to continue monitoring.   All questions were answered. The patient knows to call the clinic with any problems, questions or concerns. We can certainly see the patient much sooner if necessary.  Total encounter time:30 minutes*in face-to-face visit time, chart review, lab review, care coordination, order entry, and documentation of the encounter time.   *Total Encounter Time as defined by the Centers for Medicare and Medicaid Services includes, in addition to the face-to-face time of a patient visit (documented in the note above) non-face-to-face time: obtaining and reviewing outside history, ordering and reviewing medications, tests or procedures, care coordination (communications with other health care professionals or caregivers) and documentation in the medical record.

## 2024-02-01 ENCOUNTER — Ambulatory Visit

## 2024-02-01 DIAGNOSIS — C50411 Malignant neoplasm of upper-outer quadrant of right female breast: Secondary | ICD-10-CM

## 2024-02-01 DIAGNOSIS — R293 Abnormal posture: Secondary | ICD-10-CM

## 2024-02-01 DIAGNOSIS — Z483 Aftercare following surgery for neoplasm: Secondary | ICD-10-CM | POA: Diagnosis not present

## 2024-02-01 NOTE — Therapy (Signed)
 OUTPATIENT PHYSICAL THERAPY  UPPER EXTREMITY ONCOLOGY TREATMENT   Patient Name: Brittany [name] MRN: [id number] DOB:1955/08/24, 68 y.o., female Today's Date: 02/01/2024  END OF SESSION:  PT End of Session - 02/01/24 1057     Visit Number 3    Number of Visits 19    Date for Recertification  03/09/24    Authorization Type not needed    PT Start Time 1058    PT Stop Time 1155    PT Time Calculation (min) 57 min    Activity Tolerance Patient tolerated treatment well    Behavior During Therapy [facility] for tasks assessed/performed          Past Medical History:  Diagnosis Date   Anxiety and depression    Elevated LDL cholesterol level    Elevated liver enzymes 07/23/2021   GERD (gastroesophageal reflux disease)    Malignant neoplasm of breast (female) (HCC)    Thyroid  disorder 11/14/2017   Past Surgical History:  Procedure Laterality Date   AXILLARY LYMPH NODE DISSECTION Right 05/18/2023   Procedure: RIGHT AXILLARY LYMPH NODE DISSECTION;  Surgeon: Aron Shoulders, MD;  Location: [facility];  Service: General;  Laterality: Right;  PEC BLOCK   BREAST BIOPSY Right 03/09/2023   US  RT BREAST BX W LOC DEV 1ST LESION IMG BX SPEC US  GUIDE 03/09/2023 [facility] MAMMOGRAPHY   BREAST BIOPSY  04/12/2023   US  RT RADIOACTIVE SEED LOC 04/12/2023 [facility] ULTRASOUND   BREAST LUMPECTOMY WITH RADIOACTIVE SEED AND SENTINEL LYMPH NODE BIOPSY Right 04/13/2023   Procedure: RIGHT BREAST SEED LOCALIZED LUMPECTOMY WITH SENTINEL NODE BIOPSY;  Surgeon: Aron Shoulders, MD;  Location: [facility];  Service: General;  Laterality: Right;   HERNIA REPAIR     LAPAROSCOPIC HYSTERECTOMY  03/2017   Partial, pt still has both adnexa   PORTACATH PLACEMENT N/A 05/18/2023   Procedure: PORT PLACEMENT WITH ULTRASOUND GUIDANCE;  Surgeon: Aron Shoulders, MD;  Location: [facility];  Service: General;  Laterality: N/A;   Patient Active Problem List   Diagnosis Date Noted   Fatigue 01/26/2024   Osteoporosis screening 01/26/2024    Dysuria 01/26/2024   Port-A-Cath in place 05/31/2023   Genetic testing 03/28/2023   Family history of breast cancer 03/16/2023   Malignant neoplasm of upper-outer quadrant of right breast in female, estrogen receptor positive (HCC) 03/14/2023   Arthralgia of right knee 01/28/2023   Pneumococcal vaccination administered at current visit 01/28/2023   Decreased GFR 12/25/2021   Encounter for general adult medical examination with abnormal findings 07/23/2021   Overweight (BMI 25.0-29.9) 07/23/2021   Hyperlipidemia 07/23/2021   Prediabetes 07/23/2021   Ceruminosis, bilateral 02/18/2021   Vitamin D  deficiency 04/01/2020   Elevated LDL cholesterol level    Anxiety and depression 11/14/2017   Abnormal mammogram with microcalcification 11/14/2017   Thyroid  disorder 11/14/2017    PCP: Lauraine Pereyra, NP  REFERRING PROVIDER: Shoulders Aron, MD  REFERRING DIAG: Diagnosis C50.411 (ICD-10-CM) - Malignant neoplasm of upper-outer quadrant of right female breast Z17.0 (ICD-10-CM) - Estrogen receptor positive  THERAPY DIAG:  Aftercare following surgery for neoplasm  Malignant neoplasm of upper-outer quadrant of right breast in female, estrogen receptor positive (HCC)  Abnormal posture  ONSET DATE: 02/22/23  Rationale for Evaluation and Treatment: Rehabilitation  SUBJECTIVE:  SUBJECTIVE STATEMENT:  I took the wrap off and tried redoing myself. I really struggle with having the fingers wrapped. Once they get wet I have to take them off. Can we try leaving my fingers out?    EVAL I just finished radiation.  I think the arm is swelling .  I think the lower arm is the most swollen.  Does not have a glove or hand piece.  The axilla is not feeling swollen.  The breast isn't feeling swollen.    PERTINENT HISTORY:  Patient was diagnosed on 02/22/2023 with right grade 2 invasive ductal carcinoma breast cancer. It measures 1.3 cm and is located in the upper outer quadrant. It is ER/PR positive and HER2 negative with a Ki67 of 5% . She is now s/p Right lumpectomy with SLNB and 4+/5 LN's on 04/13/2023. She  had an ALND on 05/17/2022 with 5+/13 LN'. Completed chemotherapy, and radiation.  PAIN:  Are you having pain? No a little bit last week not this week   PRECAUTIONS: lymphedema risk   RED FLAGS: None   WEIGHT BEARING RESTRICTIONS: No  FALLS:  Has patient fallen in last 6 months? No  LIVING ENVIRONMENT: Lives with: lives alone Lives in: House/apartment  OCCUPATION: retired  LEISURE: walking, yoga  HAND DOMINANCE: right   PRIOR LEVEL OF FUNCTION: Independent  PATIENT GOALS: decrease the swelling    OBJECTIVE: Note: Objective measures were completed at Evaluation unless otherwise noted.  COGNITION: Overall cognitive status: Within functional limits for tasks assessed   PALPATION: +1 pitting to the forearm but already very firm   OBSERVATIONS / OTHER ASSESSMENTS: Rt arm visibly larger than the Lt   UPPER EXTREMITY AROM/PROM: WNL - reports no issues  LYMPHEDEMA ASSESSMENTS:  LYMPHEDEMA ASSESSMENTS (in cm):    LANDMARK RIGHT   eval RIGHT 01/27/24 02/01/24  15 cm proximal to olecranon process   32.4 29.6   10 cm proximal to olecranon process 30.3 29.7 31.8 29.1  Olecranon process 24.5 24.6 27.2 25.2  15 cm proximal to ulnar styloid process   27.9 26.2  10 cm proximal to ulnar styloid process 23.2 23.4 24.1 24.4  Just proximal to ulnar styloid process 15.7 15.7 17.7 16.7  Across hand at thumb web space 19.2 20.3 21.3 19.5  At base of 2nd digit 6.4 6.4 7.0 6.6  (Blank rows = not tested)   LANDMARK LEFT   eval 01/27/24  15 cm proximal to olecranon process  32.1  10 cm proximal to olecranon process  31.7  Olecranon process  26.1  15 cm proximal to ulna styloid process  24.8  10 cm  proximal to ulnar styloid process  21.7  Just proximal to ulnar styloid process  16.6  Across hand at thumb web space  20.2  At base of 2nd digit  7.0  (Blank rows = not tested)  Rt volume: Lt volume:  Difference of  L-DEX LYMPHEDEMA SCREENING: L-DEX LYMPHEDEMA SCREENING Measurement Type: Unilateral L-DEX MEASUREMENT EXTREMITY: Upper Extremity POSITION : Standing DOMINANT SIDE: Right At Risk Side: Right BASELINE SCORE (UNILATERAL): 2.1 L-DEX SCORE (UNILATERAL): 31.3 VALUE CHANGE (UNILAT): 29.2  TREATMENT DATE:  02/01/24: Manual Therapy MLD to Rt UE in supine: Short neck, superficial and deep abdominals, Lt axillary and pectoral nodes and establishment of anterior interaxillary pathway, Rt inguinal nodes and establishment of Rt axilloinguinal pathway, then Rt UE working proximal to distal, moving inner upper arm outwards and upwards, and doing both sides of forearms, spending extra time in any areas of fibrosis then retracing all steps and began educating pt on light pressure and skin stretch only while performing. Compression Bandaging to Rt UE: Cocoa butter to arm, tg soft to the arm, artiflex from hand to axilla, left fingers out and instructed pt we'll keep an eye on fingers for swelling and wrap them next time, 6cm hand bandage , and then 10cm wrist to axilla with X at elbow for first, and 2nd 10 cm wrist to axilla with TG soft pulled over. Instructed pt verbally while performing and handout issued.    01/30/2024 Initiated MLD and instructed pt in workings of lymphatic system and why technique is so light. In supine: Short neck, superficial and deep abdominals, L axillary nodes and establishment of interaxillary pathway, R inguinal nodes and establishment of axilloinguinal pathway, then R UE working proximal to distal, moving inner upper arm  outwards and upwards, and doing both sides of forearms, spending extra time in any areas of fibrosis then retracing all steps lotion to arm, tg soft to the arm, artiflex from hand to axilla, fingers 1-5  wrapped to PIP with mollelast 6cm hand bandage , and then 10cm wrist to axilla with X at elbow for first, and 2nd 10 cm wrist to axilla with TG soft pulled over.  Checked capillary refill and was good. Pt performed remedial exercises.    01/27/24 Eval performed Discussed findings and CDT:   Applied bandage: light to start:  eucerin lotion to arm, tg soft to the arm, artiflex from hand to axilla, 6cm hand bandage up to mid forearm, and then 10cm wrist to axilla.  Education on when to remove and bandage care.     PATIENT EDUCATION:  Education details: Compression bandaging Person educated: Patient Education method: Chief Technology Officer Education comprehension: verbalized understanding  HOME EXERCISE PROGRAM:   ASSESSMENT:  CLINICAL IMPRESSION: Continued with CDT of Rt UE and began instructing pt in MLD and bandaging while performing, handout issued for bandaging. Left fingers unwrapped at pts request but instructed her that we will only do this intermittently so as not to cause finger swelling. She verbalized understanding.      OBJECTIVE IMPAIRMENTS: decreased knowledge of condition, decreased knowledge of use of DME, and increased edema.   ACTIVITY LIMITATIONS: none  PARTICIPATION LIMITATIONS: none  PERSONAL FACTORS: 1-2 comorbidities: ALND and radiation are also affecting patient's functional outcome.   REHAB POTENTIAL: Excellent  CLINICAL DECISION MAKING: Stable/uncomplicated  EVALUATION COMPLEXITY: Low  GOALS: Goals reviewed with patient? Yes  SHORT TERM GOALS: Target date: 02/22/24  Pt will be ind with self bandaging of the Rt arm to improve independence with lymphedema care Baseline: Goal status: INITIAL   LONG TERM GOALS: Target date: 03/09/24  Pt will  be ind with self MLD for lymphedema maintenance or pursue compression pump Baseline:  Goal status: INITIAL  2.  Pt will decrease volume in the limb by at least 50ml to decrease infection risk  Baseline:  Goal status: INITIAL  3.  Pt will be measured for or obtain final compression garments for lymphedema maintenance  Baseline:  Goal status: INITIAL   PLAN:  PT  FREQUENCY: 2-3x per week   PT DURATION: 6 weeks  PLANNED INTERVENTIONS: 97110-Therapeutic exercises, 97530- Therapeutic activity, [id number]- Neuromuscular re-education, 97535- Self Care, [id number]- Manual therapy, 97760- Orthotic Initial, [id number]- Prosthetic Initial , [phone]- Orthotic/Prosthetic subsequent, Patient/Family education, Manual lymph drainage, Therapeutic exercises, Therapeutic activity, Neuromuscular re-education, Gait training, and Self Care  PLAN FOR NEXT SESSION: Cont CDT of Rt UE, instruct in self MLD and assess her bandage when she returns wrapped in bandage of her doing.   Aden Berwyn Caldron, PTA 02/01/2024, 12:03 PM

## 2024-02-03 ENCOUNTER — Ambulatory Visit

## 2024-02-03 DIAGNOSIS — Z483 Aftercare following surgery for neoplasm: Secondary | ICD-10-CM

## 2024-02-03 DIAGNOSIS — R293 Abnormal posture: Secondary | ICD-10-CM

## 2024-02-03 DIAGNOSIS — C50411 Malignant neoplasm of upper-outer quadrant of right female breast: Secondary | ICD-10-CM

## 2024-02-03 NOTE — Therapy (Signed)
 OUTPATIENT PHYSICAL THERAPY  UPPER EXTREMITY ONCOLOGY TREATMENT   Patient Name: Brittany [name] MRN: [id number] DOB:1955/07/15, 68 y.o., female Today's Date: 02/03/2024  END OF SESSION:  PT End of Session - 02/03/24 1003     Visit Number 4    Number of Visits 19    Date for Recertification  03/09/24    Authorization Type not needed    PT Start Time 1003    PT Stop Time 1100    PT Time Calculation (min) 57 min    Activity Tolerance Patient tolerated treatment well    Behavior During Therapy [facility] for tasks assessed/performed          Past Medical History:  Diagnosis Date   Anxiety and depression    Elevated LDL cholesterol level    Elevated liver enzymes 07/23/2021   GERD (gastroesophageal reflux disease)    Malignant neoplasm of breast (female) (HCC)    Thyroid  disorder 11/14/2017   Past Surgical History:  Procedure Laterality Date   AXILLARY LYMPH NODE DISSECTION Right 05/18/2023   Procedure: RIGHT AXILLARY LYMPH NODE DISSECTION;  Surgeon: Aron Shoulders, MD;  Location: [facility];  Service: General;  Laterality: Right;  PEC BLOCK   BREAST BIOPSY Right 03/09/2023   US  RT BREAST BX W LOC DEV 1ST LESION IMG BX SPEC US  GUIDE 03/09/2023 [facility]AMMOGRAPHY   BREAST BIOPSY  04/12/2023   US  RT RADIOACTIVE SEED LOC 04/12/2023 [facility] ULTRASOUND   BREAST LUMPECTOMY WITH RADIOACTIVE SEED AND SENTINEL LYMPH NODE BIOPSY Right 04/13/2023   Procedure: RIGHT BREAST SEED LOCALIZED LUMPECTOMY WITH SENTINEL NODE BIOPSY;  Surgeon: Aron Shoulders, MD;  Location: [facility];  Service: General;  Laterality: Right;   HERNIA REPAIR     LAPAROSCOPIC HYSTERECTOMY  03/2017   Partial, pt still has both adnexa   PORTACATH PLACEMENT N/A 05/18/2023   Procedure: PORT PLACEMENT WITH ULTRASOUND GUIDANCE;  Surgeon: Aron Shoulders, MD;  Location: [facility];  Service: General;  Laterality: N/A;   Patient Active Problem List   Diagnosis Date Noted   Fatigue 01/26/2024   Osteoporosis screening 01/26/2024    Dysuria 01/26/2024   Port-A-Cath in place 05/31/2023   Genetic testing 03/28/2023   Family history of breast cancer 03/16/2023   Malignant neoplasm of upper-outer quadrant of right breast in female, estrogen receptor positive (HCC) 03/14/2023   Arthralgia of right knee 01/28/2023   Pneumococcal vaccination administered at current visit 01/28/2023   Decreased GFR 12/25/2021   Encounter for general adult medical examination with abnormal findings 07/23/2021   Overweight (BMI 25.0-29.9) 07/23/2021   Hyperlipidemia 07/23/2021   Prediabetes 07/23/2021   Ceruminosis, bilateral 02/18/2021   Vitamin D  deficiency 04/01/2020   Elevated LDL cholesterol level    Anxiety and depression 11/14/2017   Abnormal mammogram with microcalcification 11/14/2017   Thyroid  disorder 11/14/2017    PCP: Lauraine Pereyra, NP  REFERRING PROVIDER: Shoulders Aron, MD  REFERRING DIAG: Diagnosis C50.411 (ICD-10-CM) - Malignant neoplasm of upper-outer quadrant of right female breast Z17.0 (ICD-10-CM) - Estrogen receptor positive  THERAPY DIAG:  Aftercare following surgery for neoplasm  Malignant neoplasm of upper-outer quadrant of right breast in female, estrogen receptor positive (HCC)  Abnormal posture  ONSET DATE: 02/22/23  Rationale for Evaluation and Treatment: Rehabilitation  SUBJECTIVE:  SUBJECTIVE STATEMENT:  I did this wrap. I took the other off last night and put this one on last night at 12:00 after washing them. I was tired so its not great.   EVAL I just finished radiation.  I think the arm is swelling .  I think the lower arm is the most swollen.  Does not have a glove or hand piece.  The axilla is not feeling swollen.  The breast isn't feeling swollen.    PERTINENT HISTORY: Patient was diagnosed on 02/22/2023 with  right grade 2 invasive ductal carcinoma breast cancer. It measures 1.3 cm and is located in the upper outer quadrant. It is ER/PR positive and HER2 negative with a Ki67 of 5% . She is now s/p Right lumpectomy with SLNB and 4+/5 LN's on 04/13/2023. She  had an ALND on 05/17/2022 with 5+/13 LN'. Completed chemotherapy, and radiation.  PAIN:  Are you having pain? No a little bit last week not this week   PRECAUTIONS: lymphedema risk   RED FLAGS: None   WEIGHT BEARING RESTRICTIONS: No  FALLS:  Has patient fallen in last 6 months? No  LIVING ENVIRONMENT: Lives with: lives alone Lives in: House/apartment  OCCUPATION: retired  LEISURE: walking, yoga  HAND DOMINANCE: right   PRIOR LEVEL OF FUNCTION: Independent  PATIENT GOALS: decrease the swelling    OBJECTIVE: Note: Objective measures were completed at Evaluation unless otherwise noted.  COGNITION: Overall cognitive status: Within functional limits for tasks assessed   PALPATION: +1 pitting to the forearm but already very firm   OBSERVATIONS / OTHER ASSESSMENTS: Rt arm visibly larger than the Lt   UPPER EXTREMITY AROM/PROM: WNL - reports no issues  LYMPHEDEMA ASSESSMENTS:  LYMPHEDEMA ASSESSMENTS (in cm):    LANDMARK RIGHT   eval RIGHT 01/27/24 02/01/24  15 cm proximal to olecranon process   32.4 29.6   10 cm proximal to olecranon process 30.3 29.7 31.8 29.1  Olecranon process 24.5 24.6 27.2 25.2  15 cm proximal to ulnar styloid process   27.9 26.2  10 cm proximal to ulnar styloid process 23.2 23.4 24.1 24.4  Just proximal to ulnar styloid process 15.7 15.7 17.7 16.7  Across hand at thumb web space 19.2 20.3 21.3 19.5  At base of 2nd digit 6.4 6.4 7.0 6.6  (Blank rows = not tested)   LANDMARK LEFT   eval 01/27/24  15 cm proximal to olecranon process  32.1  10 cm proximal to olecranon process  31.7  Olecranon process  26.1  15 cm proximal to ulna styloid process  24.8  10 cm proximal to ulnar styloid process  21.7   Just proximal to ulnar styloid process  16.6  Across hand at thumb web space  20.2  At base of 2nd digit  7.0  (Blank rows = not tested)  Rt volume: Lt volume:  Difference of  L-DEX LYMPHEDEMA SCREENING: L-DEX LYMPHEDEMA SCREENING Measurement Type: Unilateral L-DEX MEASUREMENT EXTREMITY: Upper Extremity POSITION : Standing DOMINANT SIDE: Right At Risk Side: Right BASELINE SCORE (UNILATERAL): 2.1 L-DEX SCORE (UNILATERAL): 31.3 VALUE CHANGE (UNILAT): 29.2  TREATMENT DATE:  02/03/2024 Wraps removed and bandages rolled. Pt did not have wrap up high enough and it was getting loose, with uneven spacing. Briefly showed pt how to use table to help her hold the bandage for increased ease.  Manual Therapy MLD to Rt UE in supine: Short neck, superficial and deep abdominals, Lt axillary and pectoral nodes and establishment of anterior interaxillary pathway, Rt inguinal nodes and establishment of Rt axilloinguinal pathway, then Rt UE working proximal to distal, moving inner upper arm outwards and upwards, and doing both sides of forearms, spending extra time in any areas of fibrosis then retracing all steps. Had pt try pathways and right upper arm with VC's and occasional TC's Compression Bandaging to Rt UE: Cocoa butter to arm, tg soft to the arm, Finger wrap to PIP only of fingers 1-4 with TG soft pulled over it, artiflex from hand to axilla, 6cm hand bandage , and then 10cm wrist to axilla with X at elbow for first, and 2nd 10 cm wrist to axilla with TG soft pulled over.   02/01/24: Manual Therapy MLD to Rt UE in supine: Short neck, superficial and deep abdominals, Lt axillary and pectoral nodes and establishment of anterior interaxillary pathway, Rt inguinal nodes and establishment of Rt axilloinguinal pathway, then Rt UE working proximal to distal,  moving inner upper arm outwards and upwards, and doing both sides of forearms, spending extra time in any areas of fibrosis then retracing all steps and began educating pt on light pressure and skin stretch only while performing. Compression Bandaging to Rt UE: Cocoa butter to arm, tg soft to the arm, artiflex from hand to axilla, left fingers out and instructed pt we'll keep an eye on fingers for swelling and wrap them next time, 6cm hand bandage , and then 10cm wrist to axilla with X at elbow for first, and 2nd 10 cm wrist to axilla with TG soft pulled over. Instructed pt verbally while performing and handout issued.    01/30/2024 Initiated MLD and instructed pt in workings of lymphatic system and why technique is so light. In supine: Short neck, superficial and deep abdominals, L axillary nodes and establishment of interaxillary pathway, R inguinal nodes and establishment of axilloinguinal pathway, then R UE working proximal to distal, moving inner upper arm outwards and upwards, and doing both sides of forearms, spending extra time in any areas of fibrosis then retracing all steps lotion to arm, tg soft to the arm, artiflex from hand to axilla, fingers 1-5  wrapped to PIP with mollelast 6cm hand bandage , and then 10cm wrist to axilla with X at elbow for first, and 2nd 10 cm wrist to axilla with TG soft pulled over.  Checked capillary refill and was good. Pt performed remedial exercises.    01/27/24 Eval performed Discussed findings and CDT:   Applied bandage: light to start:  eucerin lotion to arm, tg soft to the arm, artiflex from hand to axilla, 6cm hand bandage up to mid forearm, and then 10cm wrist to axilla.  Education on when to remove and bandage care.     PATIENT EDUCATION:  Education details: Compression bandaging Person educated: Patient Education method: Chief Technology Officer Education comprehension: verbalized understanding  HOME EXERCISE  PROGRAM:   ASSESSMENT:  CLINICAL IMPRESSION: No significant swelling in right hand/fingers without wrapping fingers last time, but pt agreeable to wrap today, but only to the PIP. Pt did reasonably well with MLD with VC's and TC's for pressure, and technique  OBJECTIVE IMPAIRMENTS: decreased knowledge of condition, decreased knowledge of use of DME, and increased edema.   ACTIVITY LIMITATIONS: none  PARTICIPATION LIMITATIONS: none  PERSONAL FACTORS: 1-2 comorbidities: ALND and radiation are also affecting patient's functional outcome.   REHAB POTENTIAL: Excellent  CLINICAL DECISION MAKING: Stable/uncomplicated  EVALUATION COMPLEXITY: Low  GOALS: Goals reviewed with patient? Yes  SHORT TERM GOALS: Target date: 02/22/24  Pt will be ind with self bandaging of the Rt arm to improve independence with lymphedema care Baseline: Goal status: INITIAL   LONG TERM GOALS: Target date: 03/09/24  Pt will be ind with self MLD for lymphedema maintenance or pursue compression pump Baseline:  Goal status: INITIAL  2.  Pt will decrease volume in the limb by at least 50ml to decrease infection risk  Baseline:  Goal status: INITIAL  3.  Pt will be measured for or obtain final compression garments for lymphedema maintenance  Baseline:  Goal status: INITIAL   PLAN:  PT FREQUENCY: 2-3x per week   PT DURATION: 6 weeks  PLANNED INTERVENTIONS: 97110-Therapeutic exercises, 97530- Therapeutic activity, [id number]- Neuromuscular re-education, 97535- Self Care, 02859- Manual therapy, 97760- Orthotic Initial, [id number]- Prosthetic Initial , [phone]- Orthotic/Prosthetic subsequent, Patient/Family education, Manual lymph drainage, Therapeutic exercises, Therapeutic activity, Neuromuscular re-education, Gait training, and Self Care  PLAN FOR NEXT SESSION: Cont CDT of Rt UE, instruct in self MLD and assess her bandage when she returns wrapped in bandage of her doing.   Grayce JINNY Sheldon, PT 02/03/2024,  1:03 PM

## 2024-02-06 ENCOUNTER — Encounter (HOSPITAL_COMMUNITY): Payer: Self-pay | Admitting: General Surgery

## 2024-02-06 ENCOUNTER — Ambulatory Visit

## 2024-02-06 ENCOUNTER — Other Ambulatory Visit: Payer: Self-pay | Admitting: General Surgery

## 2024-02-06 DIAGNOSIS — Z483 Aftercare following surgery for neoplasm: Secondary | ICD-10-CM

## 2024-02-06 DIAGNOSIS — R293 Abnormal posture: Secondary | ICD-10-CM

## 2024-02-06 DIAGNOSIS — C50411 Malignant neoplasm of upper-outer quadrant of right female breast: Secondary | ICD-10-CM

## 2024-02-06 NOTE — Therapy (Signed)
 OUTPATIENT PHYSICAL THERAPY  UPPER EXTREMITY ONCOLOGY TREATMENT   Patient Name: Brittany [name] MRN: [id number] DOB:[birth year], 68 y.o., female Today's Date: 02/06/2024  END OF SESSION:  PT End of Session - 02/06/24 0913     Visit Number 5    Number of Visits 19    Date for Recertification  03/09/24    Authorization Type not needed    PT Start Time 0902    PT Stop Time 0951    PT Time Calculation (min) 49 min    Activity Tolerance Patient tolerated treatment well    Behavior During Therapy [facility] for tasks assessed/performed          Past Medical History:  Diagnosis Date   Anxiety and depression    Elevated LDL cholesterol level    Elevated liver enzymes 07/23/2021   GERD (gastroesophageal reflux disease)    Malignant neoplasm of breast (female) (HCC)    Thyroid  disorder 11/14/2017   Past Surgical History:  Procedure Laterality Date   AXILLARY LYMPH NODE DISSECTION Right 05/18/2023   Procedure: RIGHT AXILLARY LYMPH NODE DISSECTION;  Surgeon: Aron Shoulders, MD;  Location: [facility] OR;  Service: General;  Laterality: Right;  PEC BLOCK   BREAST BIOPSY Right 03/09/2023   US  RT BREAST BX W LOC DEV 1ST LESION IMG BX SPEC US  GUIDE 03/09/2023 [facility] MAMMOGRAPHY   BREAST BIOPSY  04/12/2023   US  RT RADIOACTIVE SEED LOC 04/12/2023 [facility] ULTRASOUND   BREAST LUMPECTOMY WITH RADIOACTIVE SEED AND SENTINEL LYMPH NODE BIOPSY Right 04/13/2023   Procedure: RIGHT BREAST SEED LOCALIZED LUMPECTOMY WITH SENTINEL NODE BIOPSY;  Surgeon: Aron Shoulders, MD;  Location: [facility];  Service: General;  Laterality: Right;   HERNIA REPAIR     LAPAROSCOPIC HYSTERECTOMY  03/2017   Partial, pt still has both adnexa   PORTACATH PLACEMENT N/A 05/18/2023   Procedure: PORT PLACEMENT WITH ULTRASOUND GUIDANCE;  Surgeon: Aron Shoulders, MD;  Location: [facility] OR;  Service: General;  Laterality: N/A;   Patient Active Problem List   Diagnosis Date Noted   Fatigue 01/26/2024   Osteoporosis screening 01/26/2024    Dysuria 01/26/2024   Port-A-Cath in place 05/31/2023   Genetic testing 03/28/2023   Family history of breast cancer 03/16/2023   Malignant neoplasm of upper-outer quadrant of right breast in female, estrogen receptor positive (HCC) 03/14/2023   Arthralgia of right knee 01/28/2023   Pneumococcal vaccination administered at current visit 01/28/2023   Decreased GFR 12/25/2021   Encounter for general adult medical examination with abnormal findings 07/23/2021   Overweight (BMI 25.0-29.9) 07/23/2021   Hyperlipidemia 07/23/2021   Prediabetes 07/23/2021   Ceruminosis, bilateral 02/18/2021   Vitamin D  deficiency 04/01/2020   Elevated LDL cholesterol level    Anxiety and depression 11/14/2017   Abnormal mammogram with microcalcification 11/14/2017   Thyroid  disorder 11/14/2017    PCP: Lauraine Pereyra, NP  REFERRING PROVIDER: Shoulders Aron, MD  REFERRING DIAG: Diagnosis C50.411 (ICD-10-CM) - Malignant neoplasm of upper-outer quadrant of right female breast Z17.0 (ICD-10-CM) - Estrogen receptor positive  THERAPY DIAG:  Aftercare following surgery for neoplasm  Malignant neoplasm of upper-outer quadrant of right breast in female, estrogen receptor positive (HCC)  Abnormal posture  ONSET DATE: 02/22/23  Rationale for Evaluation and Treatment: Rehabilitation  SUBJECTIVE:  SUBJECTIVE STATEMENT:  I didn't want to re wrap my arm this weekend so I left Fridays bandage on. But it stayed on pretty good and has only slid down a little. I think I have a UTI. I'm waiting for a call back from my doctor but I'm going to urgent care soon if I don't hear something because I'm supposed to have my port removed this week and I don't want anything to hinder that.    EVAL I just finished radiation.  I think the arm is swelling  .  I think the lower arm is the most swollen.  Does not have a glove or hand piece.  The axilla is not feeling swollen.  The breast isn't feeling swollen.    PERTINENT HISTORY: Patient was diagnosed on 02/22/2023 with right grade 2 invasive ductal carcinoma breast cancer. It measures 1.3 cm and is located in the upper outer quadrant. It is ER/PR positive and HER2 negative with a Ki67 of 5% . She is now s/p Right lumpectomy with SLNB and 4+/5 LN's on 04/13/2023. She  had an ALND on 05/17/2022 with 5+/13 LN'. Completed chemotherapy, and radiation.  PAIN:  Are you having pain? No   PRECAUTIONS: lymphedema risk   RED FLAGS: None   WEIGHT BEARING RESTRICTIONS: No  FALLS:  Has patient fallen in last 6 months? No  LIVING ENVIRONMENT: Lives with: lives alone Lives in: House/apartment  OCCUPATION: retired  LEISURE: walking, yoga  HAND DOMINANCE: right   PRIOR LEVEL OF FUNCTION: Independent  PATIENT GOALS: decrease the swelling    OBJECTIVE: Note: Objective measures were completed at Evaluation unless otherwise noted.  COGNITION: Overall cognitive status: Within functional limits for tasks assessed   PALPATION: +1 pitting to the forearm but already very firm   OBSERVATIONS / OTHER ASSESSMENTS: Rt arm visibly larger than the Lt   UPPER EXTREMITY AROM/PROM: WNL - reports no issues  LYMPHEDEMA ASSESSMENTS:  LYMPHEDEMA ASSESSMENTS (in cm):    LANDMARK RIGHT   eval RIGHT 01/27/24 02/01/24  15 cm proximal to olecranon process   32.4 29.6   10 cm proximal to olecranon process 30.3 29.7 31.8 29.1  Olecranon process 24.5 24.6 27.2 25.2  15 cm proximal to ulnar styloid process   27.9 26.2  10 cm proximal to ulnar styloid process 23.2 23.4 24.1 24.4  Just proximal to ulnar styloid process 15.7 15.7 17.7 16.7  Across hand at thumb web space 19.2 20.3 21.3 19.5  At base of 2nd digit 6.4 6.4 7.0 6.6  (Blank rows = not tested)   LANDMARK LEFT   eval 01/27/24  15 cm proximal to  olecranon process  32.1  10 cm proximal to olecranon process  31.7  Olecranon process  26.1  15 cm proximal to ulna styloid process  24.8  10 cm proximal to ulnar styloid process  21.7  Just proximal to ulnar styloid process  16.6  Across hand at thumb web space  20.2  At base of 2nd digit  7.0  (Blank rows = not tested)  Rt volume: Lt volume:  Difference of  L-DEX LYMPHEDEMA SCREENING: L-DEX LYMPHEDEMA SCREENING Measurement Type: Unilateral L-DEX MEASUREMENT EXTREMITY: Upper Extremity POSITION : Standing DOMINANT SIDE: Right At Risk Side: Right BASELINE SCORE (UNILATERAL): 2.1 L-DEX SCORE (UNILATERAL): 31.3 VALUE CHANGE (UNILAT): 29.2  TREATMENT DATE:  02/06/24: Wraps removed while assessing skin tolerance to bandaging and bandages rolled. Her skin looks great with no areas of redness or irritation developing. Pt washed her arm with soap and water. Manual Therapy MLD to Rt UE in supine: Short neck, superficial and deep abdominals, Lt axillary and pectoral nodes and establishment of anterior interaxillary pathway, Rt inguinal nodes and establishment of Rt axilloinguinal pathway, then Rt UE working proximal to distal, moving inner upper arm outwards and upwards, and doing both sides of forearms, spending extra time in any areas of fibrosis then retracing all steps.  Compression Bandaging to Rt UE: Cocoa butter to arm, tg soft to the arm (issued new one so pt has to one to wash and one to wear), Finger wrap to fingers 1-4, artiflex from hand to axilla, 6cm hand bandage , and then 10cm wrist to axilla with X at elbow for first, and 2nd 10 cm wrist to axilla with TG soft pulled over.   02/03/2024 Wraps removed and bandages rolled. Pt did not have wrap up high enough and it was getting loose, with uneven spacing. Briefly showed pt how to use table  to help her hold the bandage for increased ease.  Manual Therapy MLD to Rt UE in supine: Short neck, superficial and deep abdominals, Lt axillary and pectoral nodes and establishment of anterior interaxillary pathway, Rt inguinal nodes and establishment of Rt axilloinguinal pathway, then Rt UE working proximal to distal, moving inner upper arm outwards and upwards, and doing both sides of forearms, spending extra time in any areas of fibrosis then retracing all steps. Had pt try pathways and right upper arm with VC's and occasional TC's Compression Bandaging to Rt UE: Cocoa butter to arm, tg soft to the arm, Finger wrap to PIP only of fingers 1-4 with TG soft pulled over it, artiflex from hand to axilla, 6cm hand bandage , and then 10cm wrist to axilla with X at elbow for first, and 2nd 10 cm wrist to axilla with TG soft pulled over.   02/01/24: Manual Therapy MLD to Rt UE in supine: Short neck, superficial and deep abdominals, Lt axillary and pectoral nodes and establishment of anterior interaxillary pathway, Rt inguinal nodes and establishment of Rt axilloinguinal pathway, then Rt UE working proximal to distal, moving inner upper arm outwards and upwards, and doing both sides of forearms, spending extra time in any areas of fibrosis then retracing all steps and began educating pt on light pressure and skin stretch only while performing. Compression Bandaging to Rt UE: Cocoa butter to arm, tg soft to the arm, artiflex from hand to axilla, left fingers out and instructed pt we'll keep an eye on fingers for swelling and wrap them next time, 6cm hand bandage , and then 10cm wrist to axilla with X at elbow for first, and 2nd 10 cm wrist to axilla with TG soft pulled over. Instructed pt verbally while performing and handout issued.    01/30/2024 Initiated MLD and instructed pt in workings of lymphatic system and why technique is so light. In supine: Short neck, superficial and deep abdominals, L axillary  nodes and establishment of interaxillary pathway, R inguinal nodes and establishment of axilloinguinal pathway, then R UE working proximal to distal, moving inner upper arm outwards and upwards, and doing both sides of forearms, spending extra time in any areas of fibrosis then retracing all steps lotion to arm, tg soft to the arm, artiflex from hand to axilla, fingers 1-5  wrapped  to PIP with mollelast 6cm hand bandage , and then 10cm wrist to axilla with X at elbow for first, and 2nd 10 cm wrist to axilla with TG soft pulled over.  Checked capillary refill and was good. Pt performed remedial exercises.      PATIENT EDUCATION:  Education details: Compression bandaging Person educated: Patient Education method: Chief Technology Officer Education comprehension: verbalized understanding  HOME EXERCISE PROGRAM:   ASSESSMENT:  CLINICAL IMPRESSION: Pt had worked finger bandages off over weekend but arm bandage still intact. Due to this her fingers were slightly swollen closer to PIP so wrapped fingers again. Encouraged pt to wash bandages again before next session as they are starting to feel loose and lose conformity.     OBJECTIVE IMPAIRMENTS: decreased knowledge of condition, decreased knowledge of use of DME, and increased edema.   ACTIVITY LIMITATIONS: none  PARTICIPATION LIMITATIONS: none  PERSONAL FACTORS: 1-2 comorbidities: ALND and radiation are also affecting patient's functional outcome.   REHAB POTENTIAL: Excellent  CLINICAL DECISION MAKING: Stable/uncomplicated  EVALUATION COMPLEXITY: Low  GOALS: Goals reviewed with patient? Yes  SHORT TERM GOALS: Target date: 02/22/24  Pt will be ind with self bandaging of the Rt arm to improve independence with lymphedema care Baseline: Goal status: INITIAL   LONG TERM GOALS: Target date: 03/09/24  Pt will be ind with self MLD for lymphedema maintenance or pursue compression pump Baseline:  Goal status: INITIAL  2.  Pt  will decrease volume in the limb by at least 50ml to decrease infection risk  Baseline:  Goal status: INITIAL  3.  Pt will be measured for or obtain final compression garments for lymphedema maintenance  Baseline:  Goal status: INITIAL   PLAN:  PT FREQUENCY: 2-3x per week   PT DURATION: 6 weeks  PLANNED INTERVENTIONS: 97110-Therapeutic exercises, 97530- Therapeutic activity, [id number]- Neuromuscular re-education, 97535- Self Care, [id number]- Manual therapy, 97760- Orthotic Initial, [id number]- Prosthetic Initial , [phone]- Orthotic/Prosthetic subsequent, Patient/Family education, Manual lymph drainage, Therapeutic exercises, Therapeutic activity, Neuromuscular re-education, Gait training, and Self Care  PLAN FOR NEXT SESSION: Cont CDT of Rt UE, cont to review self MLD while performing and assess her bandage when she returns wrapped in bandage of her doing.   Aden Berwyn Caldron, PTA 02/06/2024, 10:02 AM

## 2024-02-06 NOTE — Progress Notes (Signed)
 PCP - Lauraine Pereyra, NP Cardiologist - none Oncology - Dr Amber Iruku  Chest x-ray - 05/18/23 EKG - n/a Stress Test - n/a ECHO - 04/26/23 Cardiac Cath - n/a  ICD Pacemaker/Loop - n/a  Sleep Study -  n/a  Diabetes - n/a  Aspirin & Blood Thinner Instructions:  n/a  ERAS - clear liquids til 0600 DOS.  Anesthesia review: Yes  STOP now taking any Aspirin (unless otherwise instructed by your surgeon), Aleve, Naproxen, Ibuprofen , Motrin , Advil , Goody's, BC's, all herbal medications, fish oil, and all vitamins.   Coronavirus Screening Do you have any of the following symptoms:  Cough yes/no: No Fever (>100.78F)  yes/no: No Runny nose yes/no: No Sore throat yes/no: No Difficulty breathing/shortness of breath  yes/no: No  Have you traveled in the last 14 days and where? yes/no: No  Patient verbalized understanding of instructions that were given via phone.

## 2024-02-07 ENCOUNTER — Other Ambulatory Visit: Payer: Self-pay | Admitting: Nurse Practitioner

## 2024-02-07 ENCOUNTER — Encounter (HOSPITAL_COMMUNITY): Payer: Self-pay | Admitting: General Surgery

## 2024-02-07 ENCOUNTER — Other Ambulatory Visit: Payer: Self-pay

## 2024-02-07 DIAGNOSIS — R300 Dysuria: Secondary | ICD-10-CM

## 2024-02-07 NOTE — Anesthesia Preprocedure Evaluation (Signed)
 Anesthesia Evaluation  Patient identified by MRN, date of birth, ID band Patient awake    Reviewed: Allergy  & Precautions, NPO status , Patient's Chart, lab work & pertinent test results, reviewed documented beta blocker date and time   History of Anesthesia Complications Negative for: history of anesthetic complications  Airway Mallampati: II  TM Distance: >3 FB     Dental no notable dental hx.    Pulmonary neg COPD, former smoker   breath sounds clear to auscultation       Cardiovascular (-) angina (-) CAD and (-) Cardiac Stents  Rhythm:Regular Rate:Normal  Echo 04/26/2023: IMPRESSIONS   1. Left ventricular ejection fraction, by estimation, is 55 to 60%. The  left ventricle has normal function. The left ventricle has no regional  wall motion abnormalities. Left ventricular diastolic parameters were  normal.   2. Right ventricular systolic function is normal. The right ventricular  size is normal. There is normal pulmonary artery systolic pressure. The  estimated right ventricular systolic pressure is 26.6 mmHg.   3. The mitral valve is normal in structure. Mild mitral valve  regurgitation. No evidence of mitral stenosis.   4. Tricuspid valve regurgitation is mild to moderate.   5. The aortic valve is normal in structure. Aortic valve regurgitation is  not visualized. No aortic stenosis is present.   6. The inferior vena cava is normal in size with greater than 50%  respiratory variability, suggesting right atrial pressure of 3 mmHg.     Neuro/Psych neg Seizures PSYCHIATRIC DISORDERS Anxiety Depression       GI/Hepatic ,GERD  ,,(+) neg Cirrhosis        Endo/Other    Renal/GU Renal disease     Musculoskeletal   Abdominal   Peds  Hematology   Anesthesia Other Findings   Reproductive/Obstetrics                              Anesthesia Physical Anesthesia Plan  ASA: 2  Anesthesia  Plan: MAC   Post-op Pain Management:    Induction: Intravenous  PONV Risk Score and Plan: 2 and Ondansetron  and Propofol  infusion  Airway Management Planned: Natural Airway and Simple Face Mask  Additional Equipment:   Intra-op Plan:   Post-operative Plan: Extubation in OR  Informed Consent: I have reviewed the patients History and Physical, chart, labs and discussed the procedure including the risks, benefits and alternatives for the proposed anesthesia with the patient or authorized representative who has indicated his/her understanding and acceptance.     Dental advisory given  Plan Discussed with: CRNA  Anesthesia Plan Comments:         Anesthesia Quick Evaluation

## 2024-02-08 ENCOUNTER — Ambulatory Visit (HOSPITAL_BASED_OUTPATIENT_CLINIC_OR_DEPARTMENT_OTHER): Payer: Self-pay | Admitting: Vascular Surgery

## 2024-02-08 ENCOUNTER — Other Ambulatory Visit: Payer: Self-pay

## 2024-02-08 ENCOUNTER — Ambulatory Visit (HOSPITAL_COMMUNITY): Payer: Self-pay | Admitting: Vascular Surgery

## 2024-02-08 ENCOUNTER — Ambulatory Visit (HOSPITAL_COMMUNITY)
Admission: RE | Admit: 2024-02-08 | Discharge: 2024-02-08 | Disposition: A | Discharge status: Home / Self Care | Attending: General Surgery | Admitting: General Surgery

## 2024-02-08 ENCOUNTER — Encounter (HOSPITAL_COMMUNITY): Admission: RE | Disposition: A | Payer: Self-pay | Source: Home / Self Care | Attending: General Surgery

## 2024-02-08 DIAGNOSIS — Z87891 Personal history of nicotine dependence: Secondary | ICD-10-CM | POA: Insufficient documentation

## 2024-02-08 DIAGNOSIS — F419 Anxiety disorder, unspecified: Secondary | ICD-10-CM | POA: Diagnosis not present

## 2024-02-08 DIAGNOSIS — F32A Depression, unspecified: Secondary | ICD-10-CM | POA: Insufficient documentation

## 2024-02-08 DIAGNOSIS — Z923 Personal history of irradiation: Secondary | ICD-10-CM | POA: Insufficient documentation

## 2024-02-08 DIAGNOSIS — Z9221 Personal history of antineoplastic chemotherapy: Secondary | ICD-10-CM | POA: Insufficient documentation

## 2024-02-08 DIAGNOSIS — C50911 Malignant neoplasm of unspecified site of right female breast: Secondary | ICD-10-CM | POA: Diagnosis not present

## 2024-02-08 DIAGNOSIS — Z170 Estrogen receptor positive status [ER+]: Secondary | ICD-10-CM | POA: Insufficient documentation

## 2024-02-08 DIAGNOSIS — F418 Other specified anxiety disorders: Secondary | ICD-10-CM

## 2024-02-08 DIAGNOSIS — Z79811 Long term (current) use of aromatase inhibitors: Secondary | ICD-10-CM | POA: Insufficient documentation

## 2024-02-08 DIAGNOSIS — Z452 Encounter for adjustment and management of vascular access device: Secondary | ICD-10-CM

## 2024-02-08 DIAGNOSIS — C50411 Malignant neoplasm of upper-outer quadrant of right female breast: Secondary | ICD-10-CM | POA: Diagnosis present

## 2024-02-08 HISTORY — PX: PORT-A-CATH REMOVAL: SHX5289

## 2024-02-08 HISTORY — DX: Prediabetes: R73.03

## 2024-02-08 SURGERY — REMOVAL PORT-A-CATH
Anesthesia: Monitor Anesthesia Care

## 2024-02-08 MED ORDER — ACETAMINOPHEN 10 MG/ML IV SOLN: 1000.0000 mg | Freq: Once | INTRAVENOUS | Status: DC | PRN

## 2024-02-08 MED ORDER — MIDAZOLAM HCL 2 MG/2ML IJ SOLN
INTRAMUSCULAR | Status: AC
  Filled 2024-02-08: qty 2

## 2024-02-08 MED ORDER — PHENYLEPHRINE 80 MCG/ML (10ML) SYRINGE FOR IV PUSH (FOR BLOOD PRESSURE SUPPORT)
PREFILLED_SYRINGE | INTRAVENOUS | Status: AC
  Filled 2024-02-08: qty 10

## 2024-02-08 MED ORDER — ONDANSETRON HCL 4 MG/2ML IJ SOLN
INTRAMUSCULAR | Status: AC
Start: 2024-02-08 — End: 2024-02-08
  Filled 2024-02-08: qty 2

## 2024-02-08 MED ORDER — LACTATED RINGERS IV SOLN: INTRAVENOUS | Status: DC

## 2024-02-08 MED ORDER — MIDAZOLAM HCL 2 MG/2ML IJ SOLN
INTRAMUSCULAR | Status: DC | PRN
  Administered 2024-02-08: 2 mg via INTRAVENOUS

## 2024-02-08 MED ORDER — ONDANSETRON HCL 4 MG/2ML IJ SOLN: 4.0000 mg | Freq: Once | INTRAMUSCULAR | Status: DC | PRN

## 2024-02-08 MED ORDER — ACETAMINOPHEN 500 MG PO TABS
1000.0000 mg | ORAL_TABLET | ORAL | Status: AC
Start: 2024-02-08 — End: 2024-02-08
  Administered 2024-02-08: 1000 mg via ORAL
  Filled 2024-02-08: qty 2

## 2024-02-08 MED ORDER — OXYCODONE HCL 5 MG PO TABS: 5.0000 mg | ORAL_TABLET | Freq: Once | ORAL | Status: DC | PRN

## 2024-02-08 MED ORDER — OXYCODONE HCL 5 MG/5ML PO SOLN: 5.0000 mg | Freq: Once | ORAL | Status: DC | PRN

## 2024-02-08 MED ORDER — BUPIVACAINE-EPINEPHRINE (PF) 0.25% -1:200000 IJ SOLN
INTRAMUSCULAR | Status: AC
Start: 2024-02-08 — End: 2024-02-08
  Filled 2024-02-08: qty 30

## 2024-02-08 MED ORDER — CEFAZOLIN SODIUM-DEXTROSE 2-4 GM/100ML-% IV SOLN
2.0000 g | INTRAVENOUS | Status: AC
  Administered 2024-02-08: 2 g via INTRAVENOUS
  Filled 2024-02-08: qty 100

## 2024-02-08 MED ORDER — CHLORHEXIDINE GLUCONATE CLOTH 2 % EX PADS
6.0000 | MEDICATED_PAD | Freq: Once | CUTANEOUS | Status: AC
  Administered 2024-02-08: 6 via TOPICAL

## 2024-02-08 MED ORDER — LIDOCAINE HCL 1 % IJ SOLN
INTRAMUSCULAR | Status: DC | PRN
  Administered 2024-02-08: 28 mL via INTRAMUSCULAR

## 2024-02-08 MED ORDER — PHENYLEPHRINE 80 MCG/ML (10ML) SYRINGE FOR IV PUSH (FOR BLOOD PRESSURE SUPPORT)
PREFILLED_SYRINGE | INTRAVENOUS | Status: DC | PRN
  Administered 2024-02-08 (×3): 160 ug via INTRAVENOUS

## 2024-02-08 MED ORDER — LIDOCAINE HCL (PF) 1 % IJ SOLN
INTRAMUSCULAR | Status: AC
  Filled 2024-02-08: qty 30

## 2024-02-08 MED ORDER — CHLORHEXIDINE GLUCONATE CLOTH 2 % EX PADS
6.0000 | MEDICATED_PAD | Freq: Once | CUTANEOUS | Status: DC
Start: 2024-02-08 — End: 2024-02-08

## 2024-02-08 MED ORDER — ONDANSETRON HCL 4 MG/2ML IJ SOLN
INTRAMUSCULAR | Status: DC | PRN
  Administered 2024-02-08: 4 mg via INTRAVENOUS

## 2024-02-08 MED ORDER — FENTANYL CITRATE (PF) 100 MCG/2ML IJ SOLN: 25.0000 ug | INTRAMUSCULAR | Status: DC | PRN

## 2024-02-08 MED ORDER — PROPOFOL 500 MG/50ML IV EMUL
INTRAVENOUS | Status: DC | PRN
  Administered 2024-02-08: 25 mg via INTRAVENOUS
  Administered 2024-02-08: 125 ug/kg/min via INTRAVENOUS
  Administered 2024-02-08 (×2): 20 mg via INTRAVENOUS

## 2024-02-08 MED ORDER — CHLORHEXIDINE GLUCONATE 0.12 % MT SOLN
15.0000 mL | Freq: Once | OROMUCOSAL | Status: AC
  Administered 2024-02-08: 15 mL via OROMUCOSAL
  Filled 2024-02-08: qty 15

## 2024-02-08 MED ORDER — ORAL CARE MOUTH RINSE: 15.0000 mL | Freq: Once | OROMUCOSAL | Status: AC

## 2024-02-08 MED ORDER — FENTANYL CITRATE (PF) 250 MCG/5ML IJ SOLN
INTRAMUSCULAR | Status: AC
  Filled 2024-02-08: qty 5

## 2024-02-08 MED ORDER — FENTANYL CITRATE (PF) 250 MCG/5ML IJ SOLN
INTRAMUSCULAR | Status: DC | PRN
  Administered 2024-02-08: 25 ug via INTRAVENOUS

## 2024-02-08 SURGICAL SUPPLY — 26 items
APPLICATOR CHLORAPREP 10.5 ORG (MISCELLANEOUS) ×1 IMPLANT
BAG COUNTER SPONGE SURGICOUNT (BAG) ×1 IMPLANT
COVER SURGICAL LIGHT HANDLE (MISCELLANEOUS) ×1 IMPLANT
DERMABOND ADVANCED .7 DNX12 (GAUZE/BANDAGES/DRESSINGS) ×1 IMPLANT
DRAPE LAPAROTOMY 100X72 PEDS (DRAPES) ×1 IMPLANT
ELECT CAUTERY BLADE 6.4 (BLADE) ×1 IMPLANT
ELECTRODE REM PT RTRN 9FT ADLT (ELECTROSURGICAL) ×1 IMPLANT
GAUZE 4X4 16PLY ~~LOC~~+RFID DBL (SPONGE) ×1 IMPLANT
GLOVE BIO SURGEON STRL SZ 6 (GLOVE) ×1 IMPLANT
GLOVE INDICATOR 6.5 STRL GRN (GLOVE) ×1 IMPLANT
GOWN STRL REUS W/ TWL LRG LVL3 (GOWN DISPOSABLE) ×1 IMPLANT
GOWN STRL REUS W/ TWL XL LVL3 (GOWN DISPOSABLE) ×1 IMPLANT
KIT BASIN OR (CUSTOM PROCEDURE TRAY) ×1 IMPLANT
KIT TURNOVER KIT B (KITS) ×1 IMPLANT
NDL HYPO 25GX1X1/2 BEV (NEEDLE) ×1 IMPLANT
NEEDLE HYPO 25GX1X1/2 BEV (NEEDLE) ×1 IMPLANT
PACK GENERAL/GYN (CUSTOM PROCEDURE TRAY) ×1 IMPLANT
PAD ARMBOARD POSITIONER FOAM (MISCELLANEOUS) ×2 IMPLANT
SOLN 0.9% NACL 1000 ML (IV SOLUTION) ×1 IMPLANT
SOLN 0.9% NACL POUR BTL 1000ML (IV SOLUTION) ×1 IMPLANT
SPIKE FLUID TRANSFER (MISCELLANEOUS) ×2 IMPLANT
SUT MON AB 4-0 PC3 18 (SUTURE) ×1 IMPLANT
SUT VIC AB 3-0 SH 27X BRD (SUTURE) ×1 IMPLANT
SYR CONTROL 10ML LL (SYRINGE) ×1 IMPLANT
TOWEL GREEN STERILE (TOWEL DISPOSABLE) ×1 IMPLANT
TOWEL GREEN STERILE FF (TOWEL DISPOSABLE) ×1 IMPLANT

## 2024-02-08 NOTE — H&P (Signed)
 Brittany [name] is an 68 y.o. female.   Chief Complaint: port in place HPI:  Patient is s/p breast conservation for right breast cancer.  She completed chemo and radiation and started antihormonal therapy.  She desires port removal.   Past Medical History:  Diagnosis Date   Anxiety and depression    Elevated LDL cholesterol level    no meds   Elevated liver enzymes 07/23/2021   GERD (gastroesophageal reflux disease)    Malignant neoplasm of breast (female) (HCC)    Pre-diabetes    no meds, does not check blood sugar   Thyroid  disorder 11/14/2017    Past Surgical History:  Procedure Laterality Date   AXILLARY LYMPH NODE DISSECTION Right 05/18/2023   Procedure: RIGHT AXILLARY LYMPH NODE DISSECTION;  Surgeon: Aron Shoulders, MD;  Location: [facility];  Service: General;  Laterality: Right;  PEC BLOCK   BREAST BIOPSY Right 03/09/2023   US  RT BREAST BX W LOC DEV 1ST LESION IMG BX SPEC US  GUIDE 03/09/2023 [facility] MAMMOGRAPHY   BREAST BIOPSY  04/12/2023   US  RT RADIOACTIVE SEED LOC 04/12/2023 [facility] ULTRASOUND   BREAST LUMPECTOMY WITH RADIOACTIVE SEED AND SENTINEL LYMPH NODE BIOPSY Right 04/13/2023   Procedure: RIGHT BREAST SEED LOCALIZED LUMPECTOMY WITH SENTINEL NODE BIOPSY;  Surgeon: Aron Shoulders, MD;  Location: [facility];  Service: General;  Laterality: Right;   COLONOSCOPY  06/08/2017   HERNIA REPAIR     LAPAROSCOPIC HYSTERECTOMY  03/2017   Partial, pt still has both adnexa   PORTACATH PLACEMENT N/A 05/18/2023   Procedure: PORT PLACEMENT WITH ULTRASOUND GUIDANCE;  Surgeon: Aron Shoulders, MD;  Location: [facility];  Service: General;  Laterality: N/A;    Family History  Problem Relation Age of Onset   Breast cancer Mother 2   Diabetes Mother    Hypertension Mother    Cancer Father 49 - 80       liver?   Diabetes Brother    Cancer Paternal Uncle    Breast cancer Paternal Grandmother 69   Social History:  reports that she has quit smoking. Her smoking use included  cigarettes. She has never used smokeless tobacco. She reports current alcohol use. She reports that she does not use drugs.  Allergies:  Allergies  Allergen Reactions   Wellbutrin [Bupropion] Hives    Medications Prior to Admission  Medication Sig Dispense Refill   anastrozole  (ARIMIDEX ) 1 MG tablet Take 1 tablet (1 mg total) by mouth daily. 90 tablet 3   bimatoprost (LATISSE) 0.03 % ophthalmic solution Place 1 drop into both eyes at bedtime.     cycloSPORINE (RESTASIS) 0.05 % ophthalmic emulsion Place 1 drop into both eyes 2 (two) times daily.     EVENING PRIMROSE OIL PO Take 1 tablet by mouth daily.     Flaxseed, Linseed, (FLAX SEED OIL PO) Take 1 capsule by mouth daily.     glucosamine-chondroitin 500-400 MG tablet Take 1 tablet by mouth daily.     MAGNESIUM GLYCINATE PO Take 350 mg by mouth daily.     Methylsulfonylmethane (MSM) 1000 MG TABS Take 1 tablet by mouth 3 (three) times a week.     milk thistle 175 MG tablet Take 175 mg by mouth daily.     PARoxetine  (PAXIL ) 10 MG tablet TAKE ONE TABLET BY MOUTH ONCE A DAY (Patient taking differently: Take 10 mg by mouth at bedtime.) 90 tablet 0   pyridoxine (B-6) 100 MG tablet Take 100 mg by mouth daily.  vitamin B-12 (CYANOCOBALAMIN) 500 MCG tablet Take 500 mcg by mouth daily.     VITAMIN D  PO Take 1 tablet by mouth daily.      No results found for this or any previous visit (from the past 48 hours). No results found.  Review of Systems  All other systems reviewed and are negative.   Blood pressure (P) 123/81, pulse (P) 78, temperature (P) 98.3 F (36.8 C), temperature source (P) Oral, resp. rate (P) 16, height (P) 5' 4.5 (1.638 m), weight (P) 79.4 kg, SpO2 (P) 97%. Physical Exam Vitals reviewed.  Constitutional:      General: She is not in acute distress.    Appearance: Normal appearance. She is not ill-appearing.  HENT:     Head: Normocephalic and atraumatic.     Right Ear: External ear normal.     Left Ear: External  ear normal.     Nose: Nose normal.  Eyes:     General: No scleral icterus.    Conjunctiva/sclera: Conjunctivae normal.     Pupils: Pupils are equal, round, and reactive to light.  Cardiovascular:     Rate and Rhythm: Normal rate and regular rhythm.     Pulses: Normal pulses.  Pulmonary:     Effort: Pulmonary effort is normal.     Comments: Left sided port Abdominal:     General: Abdomen is flat. There is no distension.     Tenderness: There is no abdominal tenderness.  Musculoskeletal:        General: No swelling or deformity. Normal range of motion.     Cervical back: Normal range of motion and neck supple.  Skin:    General: Skin is warm and dry.     Capillary Refill: Capillary refill takes 2 to 3 seconds.  Neurological:     General: No focal deficit present.     Mental Status: She is alert and oriented to person, place, and time.  Psychiatric:        Mood and Affect: Mood normal.        Behavior: Behavior normal.        Thought Content: Thought content normal.        Judgment: Judgment normal.      Assessment/Plan Right breast cancer Left port in place.  Patient completed tx Desires port removal Discussed surgery and risks Patient desires to proceed.   [name], MD 02/08/2024, 8:23 AM

## 2024-02-08 NOTE — Transfer of Care (Signed)
 Immediate Anesthesia Transfer of Care Note  Patient: Brittany [name]  Procedure(s) Performed: REMOVAL PORT-A-CATH  Patient Location: PACU  Anesthesia Type:MAC  Level of Consciousness: awake, drowsy, and patient cooperative  Airway & Oxygen Therapy: Patient Spontanous Breathing  Post-op Assessment: Report given to RN and Post -op Vital signs reviewed and stable  Post vital signs: Reviewed and stable  Last Vitals:  Vitals Value Taken Time  BP 92/60 02/08/24 09:21  Temp 36.5 C 02/08/24 09:20  Pulse 78 02/08/24 09:23  Resp 16 02/08/24 09:23  SpO2 91 % 02/08/24 09:23  Vitals shown include unfiled device data.  Last Pain:  Vitals:   02/08/24 0920  TempSrc:   PainSc: 0-No pain         Complications: No notable events documented.

## 2024-02-08 NOTE — Discharge Instructions (Addendum)
 [facility] Office Phone Number [phone]   POST OP INSTRUCTIONS  Always review your discharge instruction sheet given to you by the facility where your surgery was performed.  IF YOU HAVE DISABILITY OR FAMILY LEAVE FORMS, YOU MUST BRING THEM TO THE OFFICE FOR PROCESSING.  DO NOT GIVE THEM TO YOUR DOCTOR.  Take 2 tylenol  (acetominophen) three times a day for 3 days.  If you still have pain, add ibuprofen  with food in between if able to take this (if you have kidney issues or stomach issues, do not take ibuprofen ).  If both of those are not enough, add the narcotic pain pill.  If you find you are needing a lot of this overnight after surgery, call the next morning for a refill.   Take your usually prescribed medications unless otherwise directed If you need a refill on your pain medication, please contact your pharmacy.  They will contact our office to request authorization.  Prescriptions will not be filled after 5pm or on week-ends. You should eat very light the first 24 hours after surgery, such as soup, crackers, pudding, etc.  Resume your normal diet the day after surgery It is common to experience some constipation if taking pain medication after surgery.  Increasing fluid intake and taking a stool softener will usually help or prevent this problem from occurring.  A mild laxative (Milk of Magnesia or Miralax) should be taken according to package directions if there are no bowel movements after 48 hours. You may shower in 48 hours.  The surgical glue will flake off in 2-3 weeks.   ACTIVITIES:  No strenuous activity or heavy lifting for 1 week.   You may drive when you no longer are taking prescription pain medication, you can comfortably wear a seatbelt, and you can safely maneuver your car and apply brakes. RETURN TO WORK:  __________as tolerated if applicable as long as no lifting for 4 weeks. _______________ Rosine should see your doctor in the office for a follow-up  appointment approximately three-four weeks after your surgery.    WHEN TO CALL YOUR DOCTOR: Fever over 101.0 Nausea and/or vomiting. Extreme swelling or bruising. Continued bleeding from incision. Increased pain, redness, or drainage from the incision.  The clinic staff is available to answer your questions during regular business hours.  Please don't hesitate to call and ask to speak to one of the nurses for clinical concerns.  If you have a medical emergency, go to the nearest emergency room or call 911.  A surgeon from [facility] is always on call at the hospital.  For further questions, please visit [email or web address]

## 2024-02-08 NOTE — Op Note (Signed)
  PRE-OPERATIVE DIAGNOSIS:  un-needed Port-A-Cath for right breast cancer  POST-OPERATIVE DIAGNOSIS:  Same   PROCEDURE:  Procedure(s):  REMOVAL PORT-A-CATH  SURGEON:  Surgeon(s):  Stark Klein, MD  ANESTHESIA:   MAC + local  EBL:   Minimal  SPECIMEN:  None  Complications : none known  Procedure:   Pt was  identified in the holding area and taken to the operating room where she was placed supine on the operating room table.  MAC anesthesia was induced.  The left upper chest was prepped and draped.  The prior incision was anesthetized with local anesthetic.  The incision was opened with a #15 blade.  The subcutaneous tissue was divided with the cautery.  The port was identified and the capsule opened.  The four 2-0 prolene sutures were removed.  The port was then removed and pressure held on the tract.  The catheter appeared intact without evidence of breakage, length was 25 cm.  The wound was inspected for hemostasis, which was achieved with cautery.  The wound was closed with 3-0 vicryl deep dermal interrupted sutures and 4-0 Monocryl running subcuticular suture.  The wound was cleaned, dried, and dressed with dermabond.  The patient was awakened from anesthesia and taken to the PACU in stable condition.  Needle, sponge, and instrument counts are correct.

## 2024-02-08 NOTE — Anesthesia Postprocedure Evaluation (Signed)
 Anesthesia Post Note  Patient: VANIA ROSERO  Procedure(s) Performed: REMOVAL PORT-A-CATH     Patient location during evaluation: PACU Anesthesia Type: MAC Level of consciousness: awake and alert Pain management: pain level controlled Vital Signs Assessment: post-procedure vital signs reviewed and stable Respiratory status: spontaneous breathing, nonlabored ventilation, respiratory function stable and patient connected to nasal cannula oxygen Cardiovascular status: stable and blood pressure returned to baseline Postop Assessment: no apparent nausea or vomiting Anesthetic complications: no   No notable events documented.  Last Vitals:  Vitals:   02/08/24 0930 02/08/24 0935  BP: 91/64 101/67  Pulse: 80 80  Resp: 16 16  Temp:  (!) 36.4 C  SpO2: 94% 95%    Last Pain:  Vitals:   02/08/24 0935  TempSrc:   PainSc: 0-No pain                 Lynwood MARLA Cornea

## 2024-02-09 ENCOUNTER — Encounter (HOSPITAL_COMMUNITY): Payer: Self-pay | Admitting: General Surgery

## 2024-02-10 ENCOUNTER — Ambulatory Visit: Attending: General Surgery

## 2024-02-10 DIAGNOSIS — Z170 Estrogen receptor positive status [ER+]: Secondary | ICD-10-CM | POA: Diagnosis present

## 2024-02-10 DIAGNOSIS — R293 Abnormal posture: Secondary | ICD-10-CM | POA: Diagnosis present

## 2024-02-10 DIAGNOSIS — Z483 Aftercare following surgery for neoplasm: Secondary | ICD-10-CM | POA: Insufficient documentation

## 2024-02-10 DIAGNOSIS — I890 Lymphedema, not elsewhere classified: Secondary | ICD-10-CM | POA: Diagnosis present

## 2024-02-10 DIAGNOSIS — C50411 Malignant neoplasm of upper-outer quadrant of right female breast: Secondary | ICD-10-CM | POA: Insufficient documentation

## 2024-02-10 NOTE — Therapy (Signed)
 OUTPATIENT PHYSICAL THERAPY  UPPER EXTREMITY ONCOLOGY TREATMENT   Patient Name: Brittany [name] MRN: [id number] DOB:Oct 20, 1955, 68 y.o., female Today's Date: 02/10/2024  END OF SESSION:  PT End of Session - 02/10/24 1109     Visit Number 6    Number of Visits 19    Date for Recertification  03/09/24    Authorization Type not needed    PT Start Time 1104    PT Stop Time 1155    PT Time Calculation (min) 51 min    Activity Tolerance Patient tolerated treatment well    Behavior During Therapy [facility] for tasks assessed/performed          Past Medical History:  Diagnosis Date   Anxiety and depression    Elevated LDL cholesterol level    no meds   Elevated liver enzymes 07/23/2021   GERD (gastroesophageal reflux disease)    Malignant neoplasm of breast (female) (HCC)    Pre-diabetes    no meds, does not check blood sugar   Thyroid  disorder 11/14/2017   Past Surgical History:  Procedure Laterality Date   AXILLARY LYMPH NODE DISSECTION Right 05/18/2023   Procedure: RIGHT AXILLARY LYMPH NODE DISSECTION;  Surgeon: Aron Shoulders, MD;  Location: [facility] OR;  Service: General;  Laterality: Right;  PEC BLOCK   BREAST BIOPSY Right 03/09/2023   US  RT BREAST BX W LOC DEV 1ST LESION IMG BX SPEC US  GUIDE 03/09/2023 [facility] MAMMOGRAPHY   BREAST BIOPSY  04/12/2023   US  RT RADIOACTIVE SEED LOC 04/12/2023 [facility] ULTRASOUND   BREAST LUMPECTOMY WITH RADIOACTIVE SEED AND SENTINEL LYMPH NODE BIOPSY Right 04/13/2023   Procedure: RIGHT BREAST SEED LOCALIZED LUMPECTOMY WITH SENTINEL NODE BIOPSY;  Surgeon: Aron Shoulders, MD;  Location: [facility];  Service: General;  Laterality: Right;   COLONOSCOPY  06/08/2017   HERNIA REPAIR     LAPAROSCOPIC HYSTERECTOMY  03/2017   Partial, pt still has both adnexa   PORT-A-CATH REMOVAL N/A 02/08/2024   Procedure: REMOVAL PORT-A-CATH;  Surgeon: Aron Shoulders, MD;  Location: [facility] OR;  Service: General;  Laterality: N/A;   PORTACATH PLACEMENT N/A 05/18/2023    Procedure: PORT PLACEMENT WITH ULTRASOUND GUIDANCE;  Surgeon: Aron Shoulders, MD;  Location: [facility] OR;  Service: General;  Laterality: N/A;   Patient Active Problem List   Diagnosis Date Noted   Fatigue 01/26/2024   Osteoporosis screening 01/26/2024   Dysuria 01/26/2024   Port-A-Cath in place 05/31/2023   Genetic testing 03/28/2023   Family history of breast cancer 03/16/2023   Malignant neoplasm of upper-outer quadrant of right breast in female, estrogen receptor positive (HCC) 03/14/2023   Arthralgia of right knee 01/28/2023   Pneumococcal vaccination administered at current visit 01/28/2023   Decreased GFR 12/25/2021   Encounter for general adult medical examination with abnormal findings 07/23/2021   Overweight (BMI 25.0-29.9) 07/23/2021   Hyperlipidemia 07/23/2021   Prediabetes 07/23/2021   Ceruminosis, bilateral 02/18/2021   Vitamin D  deficiency 04/01/2020   Elevated LDL cholesterol level    Anxiety and depression 11/14/2017   Abnormal mammogram with microcalcification 11/14/2017   Thyroid  disorder 11/14/2017    PCP: Lauraine Pereyra, NP  REFERRING PROVIDER: Shoulders Aron, MD  REFERRING DIAG: Diagnosis C50.411 (ICD-10-CM) - Malignant neoplasm of upper-outer quadrant of right female breast Z17.0 (ICD-10-CM) - Estrogen receptor positive  THERAPY DIAG:  Aftercare following surgery for neoplasm  Malignant neoplasm of upper-outer quadrant of right breast in female, estrogen receptor positive (HCC)  Abnormal posture  ONSET DATE: 02/22/23  Rationale for Evaluation  and Treatment: Rehabilitation  SUBJECTIVE:                                                                                                                                                                                           SUBJECTIVE STATEMENT:  I had to be out of the bandages some since I was here last. I had my port removed Wed, 10/1 so was out of the bandages most of that day. I rewrapped myself yesterday but  I know it wasn't as good as when you guys do it. I can tell my forearm feels a bit more swollen because of that.    EVAL I just finished radiation.  I think the arm is swelling .  I think the lower arm is the most swollen.  Does not have a glove or hand piece.  The axilla is not feeling swollen.  The breast isn't feeling swollen.    PERTINENT HISTORY: Patient was diagnosed on 02/22/2023 with right grade 2 invasive ductal carcinoma breast cancer. It measures 1.3 cm and is located in the upper outer quadrant. It is ER/PR positive and HER2 negative with a Ki67 of 5% . She is now s/p Right lumpectomy with SLNB and 4+/5 LN's on 04/13/2023. She  had an ALND on 05/17/2022 with 5+/13 LN'. Completed chemotherapy, and radiation.  PAIN:  Are you having pain? No   PRECAUTIONS: lymphedema risk   RED FLAGS: None   WEIGHT BEARING RESTRICTIONS: No  FALLS:  Has patient fallen in last 6 months? No  LIVING ENVIRONMENT: Lives with: lives alone Lives in: House/apartment  OCCUPATION: retired  LEISURE: walking, yoga  HAND DOMINANCE: right   PRIOR LEVEL OF FUNCTION: Independent  PATIENT GOALS: decrease the swelling    OBJECTIVE: Note: Objective measures were completed at Evaluation unless otherwise noted.  COGNITION: Overall cognitive status: Within functional limits for tasks assessed   PALPATION: +1 pitting to the forearm but already very firm   OBSERVATIONS / OTHER ASSESSMENTS: Rt arm visibly larger than the Lt   UPPER EXTREMITY AROM/PROM: WNL - reports no issues  LYMPHEDEMA ASSESSMENTS:  LYMPHEDEMA ASSESSMENTS (in cm):    LANDMARK RIGHT   eval RIGHT 01/27/24 02/01/24 02/10/24  15 cm proximal to olecranon process   32.4 29.6  29.6  10 cm proximal to olecranon process 30.3 29.7 31.8 29.1 29.2  Olecranon process 24.5 24.6 27.2 25.2 25.1  15 cm proximal to ulnar styloid process   27.9 26.2 26.6  10 cm proximal to ulnar styloid process 23.2 23.4 24.1 24.4 24.8  Just proximal to ulnar  styloid process 15.7 15.7 17.7 16.7 16.7  Across hand at  thumb web space 19.2 20.3 21.3 19.5 19.3  At base of 2nd digit 6.4 6.4 7.0 6.6 6.4  (Blank rows = not tested)   LANDMARK LEFT   eval 01/27/24  15 cm proximal to olecranon process  32.1  10 cm proximal to olecranon process  31.7  Olecranon process  26.1  15 cm proximal to ulna styloid process  24.8  10 cm proximal to ulnar styloid process  21.7  Just proximal to ulnar styloid process  16.6  Across hand at thumb web space  20.2  At base of 2nd digit  7.0  (Blank rows = not tested)  Rt volume: Lt volume:  Difference of  L-DEX LYMPHEDEMA SCREENING: L-DEX LYMPHEDEMA SCREENING Measurement Type: Unilateral L-DEX MEASUREMENT EXTREMITY: Upper Extremity POSITION : Standing DOMINANT SIDE: Right At Risk Side: Right BASELINE SCORE (UNILATERAL): 2.1 L-DEX SCORE (UNILATERAL): 31.3 VALUE CHANGE (UNILAT): 29.2                                                                                                                             TREATMENT DATE:  02/10/24: Manual Therapy Circumference measurements re taken.  MLD to Rt UE in supine: Short neck, superficial and deep abdominals, Lt axillary and pectoral nodes and establishment of anterior interaxillary pathway, Rt inguinal nodes and establishment of Rt axilloinguinal pathway, then Rt UE working proximal to distal, moving inner upper arm outwards and upwards, and doing both sides of forearms, spending extra time in any areas of fibrosis then retracing all steps.  Compression Bandaging to Rt UE: Cocoa butter to arm, tg soft to the arm, Finger wrap to fingers 1-4, artiflex from hand to axilla, 6cm hand bandage, and then 10cm wrist to axilla with X at elbow for first, and 2nd 10 cm wrist to axilla with TG soft pulled over.   02/06/24: Wraps removed while assessing skin tolerance to bandaging and bandages rolled. Her skin looks great with no areas of redness or irritation  developing. Pt washed her arm with soap and water. Manual Therapy MLD to Rt UE in supine: Short neck, superficial and deep abdominals, Lt axillary and pectoral nodes and establishment of anterior interaxillary pathway, Rt inguinal nodes and establishment of Rt axilloinguinal pathway, then Rt UE working proximal to distal, moving inner upper arm outwards and upwards, and doing both sides of forearms, spending extra time in any areas of fibrosis then retracing all steps.  Compression Bandaging to Rt UE: Cocoa butter to arm, tg soft to the arm (issued new one so pt has to one to wash and one to wear), Finger wrap to fingers 1-4, artiflex from hand to axilla, 6cm hand bandage , and then 10cm wrist to axilla with X at elbow for first, and 2nd 10 cm wrist to axilla with TG soft pulled over.   02/03/2024 Wraps removed and bandages rolled. Pt did not have wrap up high enough and it was getting loose, with uneven spacing. Briefly  showed pt how to use table to help her hold the bandage for increased ease.  Manual Therapy MLD to Rt UE in supine: Short neck, superficial and deep abdominals, Lt axillary and pectoral nodes and establishment of anterior interaxillary pathway, Rt inguinal nodes and establishment of Rt axilloinguinal pathway, then Rt UE working proximal to distal, moving inner upper arm outwards and upwards, and doing both sides of forearms, spending extra time in any areas of fibrosis then retracing all steps. Had pt try pathways and right upper arm with VC's and occasional TC's Compression Bandaging to Rt UE: Cocoa butter to arm, tg soft to the arm, Finger wrap to PIP only of fingers 1-4 with TG soft pulled over it, artiflex from hand to axilla, 6cm hand bandage , and then 10cm wrist to axilla with X at elbow for first, and 2nd 10 cm wrist to axilla with TG soft pulled over.       PATIENT EDUCATION:  Education details: Compression bandaging Person educated: Patient Education method: Product manager Education comprehension: verbalized understanding  HOME EXERCISE PROGRAM:   ASSESSMENT:  CLINICAL IMPRESSION: Pt had to be out of her bandages some since last session due to having surgery to have her port removed 2 days ago. She did reapply bandages yesterday but her forearm measurements were slightly increased (0.4 cm) due to being out of bandages for a period of time. Continued with Rt UE CDT.     OBJECTIVE IMPAIRMENTS: decreased knowledge of condition, decreased knowledge of use of DME, and increased edema.   ACTIVITY LIMITATIONS: none  PARTICIPATION LIMITATIONS: none  PERSONAL FACTORS: 1-2 comorbidities: ALND and radiation are also affecting patient's functional outcome.   REHAB POTENTIAL: Excellent  CLINICAL DECISION MAKING: Stable/uncomplicated  EVALUATION COMPLEXITY: Low  GOALS: Goals reviewed with patient? Yes  SHORT TERM GOALS: Target date: 02/22/24  Pt will be ind with self bandaging of the Rt arm to improve independence with lymphedema care Baseline: Goal status: INITIAL   LONG TERM GOALS: Target date: 03/09/24  Pt will be ind with self MLD for lymphedema maintenance or pursue compression pump Baseline:  Goal status: INITIAL  2.  Pt will decrease volume in the limb by at least 50ml to decrease infection risk  Baseline:  Goal status: INITIAL  3.  Pt will be measured for or obtain final compression garments for lymphedema maintenance  Baseline:  Goal status: INITIAL   PLAN:  PT FREQUENCY: 2-3x per week   PT DURATION: 6 weeks  PLANNED INTERVENTIONS: 97110-Therapeutic exercises, 97530- Therapeutic activity, [id number]- Neuromuscular re-education, 97535- Self Care, [id number]- Manual therapy, 97760- Orthotic Initial, [id number]- Prosthetic Initial , [phone]- Orthotic/Prosthetic subsequent, Patient/Family education, Manual lymph drainage, Therapeutic exercises, Therapeutic activity, Neuromuscular re-education, Gait training, and Self Care  PLAN FOR NEXT  SESSION: Cont CDT of Rt UE, cont to review self MLD while performing and assess her bandage when she returns wrapped in bandage of her doing.   Aden Berwyn Caldron, PTA 02/10/2024, 12:05 PM

## 2024-02-13 ENCOUNTER — Ambulatory Visit

## 2024-02-13 DIAGNOSIS — Z483 Aftercare following surgery for neoplasm: Secondary | ICD-10-CM

## 2024-02-13 DIAGNOSIS — I890 Lymphedema, not elsewhere classified: Secondary | ICD-10-CM

## 2024-02-13 DIAGNOSIS — C50411 Malignant neoplasm of upper-outer quadrant of right female breast: Secondary | ICD-10-CM

## 2024-02-13 DIAGNOSIS — R293 Abnormal posture: Secondary | ICD-10-CM

## 2024-02-13 NOTE — Therapy (Signed)
 OUTPATIENT PHYSICAL THERAPY  UPPER EXTREMITY ONCOLOGY TREATMENT   Patient Name: Brittany [name] MRN: [id number] DOB:[birth year], 68 y.o., female Today's Date: 02/13/2024  END OF SESSION:  PT End of Session - 02/13/24 1400     Visit Number 7    Number of Visits 19    Date for Recertification  03/09/24    PT Start Time 1402    PT Stop Time 1457    PT Time Calculation (min) 55 min    Activity Tolerance Patient tolerated treatment well    Behavior During Therapy [facility] for tasks assessed/performed          Past Medical History:  Diagnosis Date   Anxiety and depression    Elevated LDL cholesterol level    no meds   Elevated liver enzymes 07/23/2021   GERD (gastroesophageal reflux disease)    Malignant neoplasm of breast (female) (HCC)    Pre-diabetes    no meds, does not check blood sugar   Thyroid  disorder 11/14/2017   Past Surgical History:  Procedure Laterality Date   AXILLARY LYMPH NODE DISSECTION Right 05/18/2023   Procedure: RIGHT AXILLARY LYMPH NODE DISSECTION;  Surgeon: Brittany Shoulders, MD;  Location: [facility];  Service: General;  Laterality: Right;  PEC BLOCK   BREAST BIOPSY Right 03/09/2023   US  RT BREAST BX W LOC DEV 1ST LESION IMG BX SPEC US  GUIDE 03/09/2023 [facility] MAMMOGRAPHY   BREAST BIOPSY  04/12/2023   US  RT RADIOACTIVE SEED LOC 04/12/2023 [facility] ULTRASOUND   BREAST LUMPECTOMY WITH RADIOACTIVE SEED AND SENTINEL LYMPH NODE BIOPSY Right 04/13/2023   Procedure: RIGHT BREAST SEED LOCALIZED LUMPECTOMY WITH SENTINEL NODE BIOPSY;  Surgeon: Brittany Shoulders, MD;  Location: [facility];  Service: General;  Laterality: Right;   COLONOSCOPY  06/08/2017   HERNIA REPAIR     LAPAROSCOPIC HYSTERECTOMY  03/2017   Partial, pt still has both adnexa   PORT-A-CATH REMOVAL N/A 02/08/2024   Procedure: REMOVAL PORT-A-CATH;  Surgeon: Brittany Shoulders, MD;  Location: [facility];  Service: General;  Laterality: N/A;   PORTACATH PLACEMENT N/A 05/18/2023   Procedure: PORT PLACEMENT WITH  ULTRASOUND GUIDANCE;  Surgeon: Brittany Shoulders, MD;  Location: [facility];  Service: General;  Laterality: N/A;   Patient Active Problem List   Diagnosis Date Noted   Fatigue 01/26/2024   Osteoporosis screening 01/26/2024   Dysuria 01/26/2024   Port-A-Cath in place 05/31/2023   Genetic testing 03/28/2023   Family history of breast cancer 03/16/2023   Malignant neoplasm of upper-outer quadrant of right breast in female, estrogen receptor positive (HCC) 03/14/2023   Arthralgia of right knee 01/28/2023   Pneumococcal vaccination administered at current visit 01/28/2023   Decreased GFR 12/25/2021   Encounter for general adult medical examination with abnormal findings 07/23/2021   Overweight (BMI 25.0-29.9) 07/23/2021   Hyperlipidemia 07/23/2021   Prediabetes 07/23/2021   Ceruminosis, bilateral 02/18/2021   Vitamin D  deficiency 04/01/2020   Elevated LDL cholesterol level    Anxiety and depression 11/14/2017   Abnormal mammogram with microcalcification 11/14/2017   Thyroid  disorder 11/14/2017    PCP: Brittany Pereyra, NP  REFERRING PROVIDER: Shoulders Aron, MD  REFERRING DIAG: Diagnosis C50.411 (ICD-10-CM) - Malignant neoplasm of upper-outer quadrant of right female breast Z17.0 (ICD-10-CM) - Estrogen receptor positive  THERAPY DIAG:  Aftercare following surgery for neoplasm  Malignant neoplasm of upper-outer quadrant of right breast in female, estrogen receptor positive (HCC)  Abnormal posture  Lymphedema, not elsewhere classified  ONSET DATE: 02/22/23  Rationale for Evaluation and Treatment:  Rehabilitation  SUBJECTIVE:                                                                                                                                                                                           SUBJECTIVE STATEMENT:  I just started with shingles today. It is in my left shoulder and chest area. It started last Thursday.Sat and Sunday I had a lot of pain and itching and I  couldn't sleep. I kept the wrap on from Friday until last night so I took it off last night and I just wore my sleeve today.  EVAL I just finished radiation.  I think the arm is swelling .  I think the lower arm is the most swollen.  Does not have a glove or hand piece.  The axilla is not feeling swollen.  The breast isn't feeling swollen.    PERTINENT HISTORY: Patient was diagnosed on 02/22/2023 with right grade 2 invasive ductal carcinoma breast cancer. It measures 1.3 cm and is located in the upper outer quadrant. It is ER/PR positive and HER2 negative with a Ki67 of 5% . She is now s/p Right lumpectomy with SLNB and 4+/5 LN's on 04/13/2023. She  had an ALND on 05/17/2022 with 5+/13 LN'. Completed chemotherapy, and radiation.  PAIN:  Are you having pain?  PAIN:  Are you having pain? Yes NPRS scale: 4/10 Pain location: left chest shingles Pain orientation: Left  PAIN TYPE: burning and sharp Pain description: constant  Aggravating factors: trauma from sx, Relieving factors: calamine lotion, desiden   PRECAUTIONS: lymphedema risk   RED FLAGS: None   WEIGHT BEARING RESTRICTIONS: No  FALLS:  Has patient fallen in last 6 months? No  LIVING ENVIRONMENT: Lives with: lives alone Lives in: House/apartment  OCCUPATION: retired  LEISURE: walking, yoga  HAND DOMINANCE: right   PRIOR LEVEL OF FUNCTION: Independent  PATIENT GOALS: decrease the swelling    OBJECTIVE: Note: Objective measures were completed at Evaluation unless otherwise noted.  COGNITION: Overall cognitive status: Within functional limits for tasks assessed   PALPATION: +1 pitting to the forearm but already very firm   OBSERVATIONS / OTHER ASSESSMENTS: Rt arm visibly larger than the Lt   UPPER EXTREMITY AROM/PROM: WNL - reports no issues  LYMPHEDEMA ASSESSMENTS:  LYMPHEDEMA ASSESSMENTS (in cm):    LANDMARK RIGHT   eval RIGHT 01/27/24 02/01/24 02/10/24  15 cm proximal to olecranon process   32.4 29.6  29.6   10 cm proximal to olecranon process 30.3 29.7 31.8 29.1 29.2  Olecranon process 24.5 24.6 27.2 25.2 25.1  15 cm proximal to ulnar styloid process   27.9 26.2  26.6  10 cm proximal to ulnar styloid process 23.2 23.4 24.1 24.4 24.8  Just proximal to ulnar styloid process 15.7 15.7 17.7 16.7 16.7  Across hand at thumb web space 19.2 20.3 21.3 19.5 19.3  At base of 2nd digit 6.4 6.4 7.0 6.6 6.4  (Blank rows = not tested)   LANDMARK LEFT   eval 01/27/24  15 cm proximal to olecranon process  32.1  10 cm proximal to olecranon process  31.7  Olecranon process  26.1  15 cm proximal to ulna styloid process  24.8  10 cm proximal to ulnar styloid process  21.7  Just proximal to ulnar styloid process  16.6  Across hand at thumb web space  20.2  At base of 2nd digit  7.0  (Blank rows = not tested)  Rt volume: Lt volume:  Difference of  L-DEX LYMPHEDEMA SCREENING: L-DEX LYMPHEDEMA SCREENING Measurement Type: Unilateral L-DEX MEASUREMENT EXTREMITY: Upper Extremity POSITION : Standing DOMINANT SIDE: Right At Risk Side: Right BASELINE SCORE (UNILATERAL): 2.1 L-DEX SCORE (UNILATERAL): 31.3 VALUE CHANGE (UNILAT): 29.2                                                                                                                             TREATMENT DATE:  02/13/2024 Manual Therapy MLD modified due to shingles at left chest/shoulder  MLD to Rt UE in supine: Short neck, superficial and deep abdominals, Lt axillary and pectoral nodes and establishment of POSTERIOR interaxillary pathway, Rt inguinal nodes and establishment of Rt axilloinguinal pathway, then Rt UE working proximal to distal, moving inner upper arm outwards and upwards, and doing both sides of forearms, spending extra time in any areas of fibrosis then retracing all steps.  Compression Bandaging to Rt UE: Cocoa butter to arm, tg soft to the arm, Finger wrap to fingers 1-4, artiflex from hand to axilla, 6cm hand  bandage, and then 10cm wrist to axilla with X at elbow for first, and 2nd 10 cm wrist to axilla with TG soft pulled over.   02/10/24: Manual Therapy Circumference measurements re taken.  MLD to Rt UE in supine: Short neck, superficial and deep abdominals, Lt axillary and pectoral nodes and establishment of Anterior interaxillary pathway, Rt inguinal nodes and establishment of Rt axilloinguinal pathway, then Rt UE working proximal to distal, moving inner upper arm outwards and upwards, and doing both sides of forearms, spending extra time in any areas of fibrosis then retracing all steps.  Compression Bandaging to Rt UE: Cocoa butter to arm, tg soft to the arm, Finger wrap to fingers 1-4, artiflex from hand to axilla, 6cm hand bandage, and then 10cm wrist to axilla with X at elbow for first, and 2nd 10 cm wrist to axilla with TG soft pulled over.   02/06/24: Wraps removed while assessing skin tolerance to bandaging and bandages rolled. Her skin looks great with no areas of redness or irritation developing. Pt washed her arm with soap  and water. Manual Therapy MLD to Rt UE in supine: Short neck, superficial and deep abdominals, Lt axillary and pectoral nodes and establishment of anterior interaxillary pathway, Rt inguinal nodes and establishment of Rt axilloinguinal pathway, then Rt UE working proximal to distal, moving inner upper arm outwards and upwards, and doing both sides of forearms, spending extra time in any areas of fibrosis then retracing all steps.  Compression Bandaging to Rt UE: Cocoa butter to arm, tg soft to the arm (issued new one so pt has to one to wash and one to wear), Finger wrap to fingers 1-4, artiflex from hand to axilla, 6cm hand bandage , and then 10cm wrist to axilla with X at elbow for first, and 2nd 10 cm wrist to axilla with TG soft pulled over.   02/03/2024 Wraps removed and bandages rolled. Pt did not have wrap up high enough and it was getting loose, with uneven spacing.  Briefly showed pt how to use table to help her hold the bandage for increased ease.  Manual Therapy MLD to Rt UE in supine: Short neck, superficial and deep abdominals, Lt axillary and pectoral nodes and establishment of anterior interaxillary pathway, Rt inguinal nodes and establishment of Rt axilloinguinal pathway, then Rt UE working proximal to distal, moving inner upper arm outwards and upwards, and doing both sides of forearms, spending extra time in any areas of fibrosis then retracing all steps. Had pt try pathways and right upper arm with VC's and occasional TC's Compression Bandaging to Rt UE: Cocoa butter to arm, tg soft to the arm, Finger wrap to PIP only of fingers 1-4 with TG soft pulled over it, artiflex from hand to axilla, 6cm hand bandage , and then 10cm wrist to axilla with X at elbow for first, and 2nd 10 cm wrist to axilla with TG soft pulled over.       PATIENT EDUCATION:  Education details: Compression bandaging Person educated: Patient Education method: Chief Technology Officer Education comprehension: verbalized understanding  HOME EXERCISE PROGRAM:   ASSESSMENT:  CLINICAL IMPRESSION:  Pt with recent onset of shingles at left shoulder and chest so modified MLD to use Posterior inter-axillary anastomosis instead of anterior. Pt had some pain at web space of thumb from prior thumb wrap so modified slightly today and padded with artiflex   OBJECTIVE IMPAIRMENTS: decreased knowledge of condition, decreased knowledge of use of DME, and increased edema.   ACTIVITY LIMITATIONS: none  PARTICIPATION LIMITATIONS: none  PERSONAL FACTORS: 1-2 comorbidities: ALND and radiation are also affecting patient's functional outcome.   REHAB POTENTIAL: Excellent  CLINICAL DECISION MAKING: Stable/uncomplicated  EVALUATION COMPLEXITY: Low  GOALS: Goals reviewed with patient? Yes  SHORT TERM GOALS: Target date: 02/22/24  Pt will be ind with self bandaging of the Rt arm to  improve independence with lymphedema care Baseline: Goal status: INITIAL   LONG TERM GOALS: Target date: 03/09/24  Pt will be ind with self MLD for lymphedema maintenance or pursue compression pump Baseline:  Goal status: INITIAL  2.  Pt will decrease volume in the limb by at least 50ml to decrease infection risk  Baseline:  Goal status: INITIAL  3.  Pt will be measured for or obtain final compression garments for lymphedema maintenance  Baseline:  Goal status: INITIAL   PLAN:  PT FREQUENCY: 2-3x per week   PT DURATION: 6 weeks  PLANNED INTERVENTIONS: 97110-Therapeutic exercises, 97530- Therapeutic activity, [id number]- Neuromuscular re-education, 97535- Self Care, [id number]- Manual therapy, [id number]- Orthotic Initial, [id number]- Prosthetic Initial ,  [id number]- Orthotic/Prosthetic subsequent, Patient/Family education, Manual lymph drainage, Therapeutic exercises, Therapeutic activity, Neuromuscular re-education, Gait training, and Self Care  PLAN FOR NEXT SESSION: Cont CDT of Rt UE, USE Posterior interaxillary anastomosis prn due to left chest chingles, cont to review self MLD while performing and assess her bandage when she returns wrapped in bandage of her doing.   Grayce JINNY Sheldon, PT 02/13/2024, 2:59 PM

## 2024-02-15 ENCOUNTER — Ambulatory Visit

## 2024-02-15 DIAGNOSIS — Z483 Aftercare following surgery for neoplasm: Secondary | ICD-10-CM

## 2024-02-15 DIAGNOSIS — Z170 Estrogen receptor positive status [ER+]: Secondary | ICD-10-CM

## 2024-02-15 DIAGNOSIS — R293 Abnormal posture: Secondary | ICD-10-CM

## 2024-02-15 DIAGNOSIS — I890 Lymphedema, not elsewhere classified: Secondary | ICD-10-CM

## 2024-02-15 NOTE — Therapy (Signed)
 OUTPATIENT PHYSICAL THERAPY  UPPER EXTREMITY ONCOLOGY TREATMENT   Patient Name: Brittany [name] MRN: [id number] DOB:07/05/1955, 68 y.o., female Today's Date: 02/15/2024  END OF SESSION:  PT End of Session - 02/15/24 1508     Visit Number 8    Number of Visits 19    Date for Recertification  03/09/24    Authorization Type not needed    PT Start Time 1502    PT Stop Time 1558    PT Time Calculation (min) 56 min    Activity Tolerance Patient tolerated treatment well    Behavior During Therapy [facility] for tasks assessed/performed          Past Medical History:  Diagnosis Date   Anxiety and depression    Elevated LDL cholesterol level    no meds   Elevated liver enzymes 07/23/2021   GERD (gastroesophageal reflux disease)    Malignant neoplasm of breast (female) (HCC)    Pre-diabetes    no meds, does not check blood sugar   Thyroid  disorder 11/14/2017   Past Surgical History:  Procedure Laterality Date   AXILLARY LYMPH NODE DISSECTION Right 05/18/2023   Procedure: RIGHT AXILLARY LYMPH NODE DISSECTION;  Surgeon: Aron Shoulders, MD;  Location: [facility];  Service: General;  Laterality: Right;  PEC BLOCK   BREAST BIOPSY Right 03/09/2023   US  RT BREAST BX W LOC DEV 1ST LESION IMG BX SPEC US  GUIDE 03/09/2023 [facility]AMMOGRAPHY   BREAST BIOPSY  04/12/2023   US  RT RADIOACTIVE SEED LOC 04/12/2023 [facility] ULTRASOUND   BREAST LUMPECTOMY WITH RADIOACTIVE SEED AND SENTINEL LYMPH NODE BIOPSY Right 04/13/2023   Procedure: RIGHT BREAST SEED LOCALIZED LUMPECTOMY WITH SENTINEL NODE BIOPSY;  Surgeon: Aron Shoulders, MD;  Location: [facility];  Service: General;  Laterality: Right;   COLONOSCOPY  06/08/2017   HERNIA REPAIR     LAPAROSCOPIC HYSTERECTOMY  03/2017   Partial, pt still has both adnexa   PORT-A-CATH REMOVAL N/A 02/08/2024   Procedure: REMOVAL PORT-A-CATH;  Surgeon: Aron Shoulders, MD;  Location: [facility];  Service: General;  Laterality: N/A;   PORTACATH PLACEMENT N/A 05/18/2023    Procedure: PORT PLACEMENT WITH ULTRASOUND GUIDANCE;  Surgeon: Aron Shoulders, MD;  Location: [facility];  Service: General;  Laterality: N/A;   Patient Active Problem List   Diagnosis Date Noted   Fatigue 01/26/2024   Osteoporosis screening 01/26/2024   Dysuria 01/26/2024   Port-A-Cath in place 05/31/2023   Genetic testing 03/28/2023   Family history of breast cancer 03/16/2023   Malignant neoplasm of upper-outer quadrant of right breast in female, estrogen receptor positive (HCC) 03/14/2023   Arthralgia of right knee 01/28/2023   Pneumococcal vaccination administered at current visit 01/28/2023   Decreased GFR 12/25/2021   Encounter for general adult medical examination with abnormal findings 07/23/2021   Overweight (BMI 25.0-29.9) 07/23/2021   Hyperlipidemia 07/23/2021   Prediabetes 07/23/2021   Ceruminosis, bilateral 02/18/2021   Vitamin D  deficiency 04/01/2020   Elevated LDL cholesterol level    Anxiety and depression 11/14/2017   Abnormal mammogram with microcalcification 11/14/2017   Thyroid  disorder 11/14/2017    PCP: Lauraine Pereyra, NP  REFERRING PROVIDER: Shoulders Aron, MD  REFERRING DIAG: Diagnosis C50.411 (ICD-10-CM) - Malignant neoplasm of upper-outer quadrant of right female breast Z17.0 (ICD-10-CM) - Estrogen receptor positive  THERAPY DIAG:  Aftercare following surgery for neoplasm  Malignant neoplasm of upper-outer quadrant of right breast in female, estrogen receptor positive (HCC)  Abnormal posture  Lymphedema, not elsewhere classified  ONSET DATE:  02/22/23  Rationale for Evaluation and Treatment: Rehabilitation  SUBJECTIVE:                                                                                                                                                                                           SUBJECTIVE STATEMENT:  Will you measure my arm? It feels smaller. They gave me steroids for the shingles and that has helped a lot. It's mostly gone  now.   EVAL I just finished radiation.  I think the arm is swelling .  I think the lower arm is the most swollen.  Does not have a glove or hand piece.  The axilla is not feeling swollen.  The breast isn't feeling swollen.    PERTINENT HISTORY: Patient was diagnosed on 02/22/2023 with right grade 2 invasive ductal carcinoma breast cancer. It measures 1.3 cm and is located in the upper outer quadrant. It is ER/PR positive and HER2 negative with a Ki67 of 5% . She is now s/p Right lumpectomy with SLNB and 4+/5 LN's on 04/13/2023. She  had an ALND on 05/17/2022 with 5+/13 LN'. Completed chemotherapy, and radiation.  PAIN:  Are you having pain?  PAIN:  Are you having pain? No   PRECAUTIONS: lymphedema risk   RED FLAGS: None   WEIGHT BEARING RESTRICTIONS: No  FALLS:  Has patient fallen in last 6 months? No  LIVING ENVIRONMENT: Lives with: lives alone Lives in: House/apartment  OCCUPATION: retired  LEISURE: walking, yoga  HAND DOMINANCE: right   PRIOR LEVEL OF FUNCTION: Independent  PATIENT GOALS: decrease the swelling    OBJECTIVE: Note: Objective measures were completed at Evaluation unless otherwise noted.  COGNITION: Overall cognitive status: Within functional limits for tasks assessed   PALPATION: +1 pitting to the forearm but already very firm   OBSERVATIONS / OTHER ASSESSMENTS: Rt arm visibly larger than the Lt   UPPER EXTREMITY AROM/PROM: WNL - reports no issues  LYMPHEDEMA ASSESSMENTS:  LYMPHEDEMA ASSESSMENTS (in cm):    LANDMARK RIGHT   eval RIGHT 01/27/24 02/01/24 02/10/24 02/15/24  15 cm proximal to olecranon process   32.4 29.6  29.6 29.3  10 cm proximal to olecranon process 30.3 29.7 31.8 29.1 29.2 28.3  Olecranon process 24.5 24.6 27.2 25.2 25.1 24.2  15 cm proximal to ulnar styloid process   27.9 26.2 26.6 25.8  10 cm proximal to ulnar styloid process 23.2 23.4 24.1 24.4 24.8 24.3  Just proximal to ulnar styloid process 15.7 15.7 17.7 16.7 16.7 16   Across hand at thumb web space 19.2 20.3 21.3 19.5 19.3 18.8  At base of 2nd digit 6.4 6.4 7.0  6.6 6.4 6.2  (Blank rows = not tested)   LANDMARK LEFT   eval 01/27/24  15 cm proximal to olecranon process  32.1  10 cm proximal to olecranon process  31.7  Olecranon process  26.1  15 cm proximal to ulna styloid process  24.8  10 cm proximal to ulnar styloid process  21.7  Just proximal to ulnar styloid process  16.6  Across hand at thumb web space  20.2  At base of 2nd digit  7.0  (Blank rows = not tested)  Rt volume: Lt volume:  Difference of  L-DEX LYMPHEDEMA SCREENING: L-DEX LYMPHEDEMA SCREENING Measurement Type: Unilateral L-DEX MEASUREMENT EXTREMITY: Upper Extremity POSITION : Standing DOMINANT SIDE: Right At Risk Side: Right BASELINE SCORE (UNILATERAL): 2.1 L-DEX SCORE (UNILATERAL): 31.3 VALUE CHANGE (UNILAT): 29.2                                                                                                                             TREATMENT DATE:  02/15/24: Manual Therapy  MLD to Rt UE in supine: Short neck, superficial and deep abdominals, Lt axillary and pectoral nodes and establishment of anterior interaxillary pathway, Rt inguinal nodes and establishment of Rt axilloinguinal pathway, then Rt UE working proximal to distal, moving inner upper arm outwards and upwards, and doing both sides of forearms, spending extra time in any areas of fibrosis then retracing all steps.  Compression Bandaging to Rt UE: Cocoa butter to arm, tg soft to the arm, Finger wrap to fingers 1-4 starting closer to PIP, artiflex from hand to axilla, 6cm hand bandage, and then 10cm wrist to axilla with X at elbow for first, and 2nd 10 cm wrist to axilla with TG soft pulled over.  02/13/2024 Manual Therapy MLD modified due to shingles at left chest/shoulder  MLD to Rt UE in supine: Short neck, superficial and deep abdominals, Lt axillary and pectoral nodes and establishment  of POSTERIOR interaxillary pathway, Rt inguinal nodes and establishment of Rt axilloinguinal pathway, then Rt UE working proximal to distal, moving inner upper arm outwards and upwards, and doing both sides of forearms, spending extra time in any areas of fibrosis then retracing all steps.  Compression Bandaging to Rt UE: Cocoa butter to arm, tg soft to the arm, Finger wrap to fingers 1-4, artiflex from hand to axilla, 6cm hand bandage, and then 10cm wrist to axilla with X at elbow for first, and 2nd 10 cm wrist to axilla with TG soft pulled over.   02/10/24: Manual Therapy Circumference measurements re taken.  MLD to Rt UE in supine: Short neck, superficial and deep abdominals, Lt axillary and pectoral nodes and establishment of Anterior interaxillary pathway, Rt inguinal nodes and establishment of Rt axilloinguinal pathway, then Rt UE working proximal to distal, moving inner upper arm outwards and upwards, and doing both sides of forearms, spending extra time in any areas of fibrosis then retracing all steps.  Compression Bandaging to Rt  UE: Cocoa butter to arm, tg soft to the arm, Finger wrap to fingers 1-4, artiflex from hand to axilla, 6cm hand bandage, and then 10cm wrist to axilla with X at elbow for first, and 2nd 10 cm wrist to axilla with TG soft pulled over.       PATIENT EDUCATION:  Education details: Compression bandaging Person educated: Patient Education method: Chief Technology Officer Education comprehension: verbalized understanding  HOME EXERCISE PROGRAM:   ASSESSMENT:  CLINICAL IMPRESSION: Pt reports shingles mostly gone now due to getting steroids so resumed use of anterior anastomosis. Re measured circumference as pt reports feeling like her arm is smaller today and she did have some good reductions.    OBJECTIVE IMPAIRMENTS: decreased knowledge of condition, decreased knowledge of use of DME, and increased edema.   ACTIVITY LIMITATIONS: none  PARTICIPATION  LIMITATIONS: none  PERSONAL FACTORS: 1-2 comorbidities: ALND and radiation are also affecting patient's functional outcome.   REHAB POTENTIAL: Excellent  CLINICAL DECISION MAKING: Stable/uncomplicated  EVALUATION COMPLEXITY: Low  GOALS: Goals reviewed with patient? Yes  SHORT TERM GOALS: Target date: 02/22/24  Pt will be ind with self bandaging of the Rt arm to improve independence with lymphedema care Baseline: Goal status: INITIAL   LONG TERM GOALS: Target date: 03/09/24  Pt will be ind with self MLD for lymphedema maintenance or pursue compression pump Baseline:  Goal status: INITIAL  2.  Pt will decrease volume in the limb by at least 50ml to decrease infection risk  Baseline:  Goal status: INITIAL  3.  Pt will be measured for or obtain final compression garments for lymphedema maintenance  Baseline:  Goal status: INITIAL   PLAN:  PT FREQUENCY: 2-3x per week   PT DURATION: 6 weeks  PLANNED INTERVENTIONS: 97110-Therapeutic exercises, 97530- Therapeutic activity, 97112- Neuromuscular re-education, 97535- Self Care, [id number]- Manual therapy, [phone]- Orthotic Initial, [id number]- Prosthetic Initial , [phone]- Orthotic/Prosthetic subsequent, Patient/Family education, Manual lymph drainage, Therapeutic exercises, Therapeutic activity, Neuromuscular re-education, Gait training, and Self Care  PLAN FOR NEXT SESSION: Cont CDT of Rt UE, USE Posterior interaxillary anastomosis prn due to left chest chingles, cont to review self MLD while performing and assess her bandage when she returns wrapped in bandage of her doing.   Aden Berwyn Caldron, PTA 02/15/2024, 4:02 PM

## 2024-02-17 ENCOUNTER — Ambulatory Visit

## 2024-02-17 DIAGNOSIS — Z483 Aftercare following surgery for neoplasm: Secondary | ICD-10-CM

## 2024-02-17 DIAGNOSIS — R293 Abnormal posture: Secondary | ICD-10-CM

## 2024-02-17 DIAGNOSIS — I890 Lymphedema, not elsewhere classified: Secondary | ICD-10-CM

## 2024-02-17 DIAGNOSIS — C50411 Malignant neoplasm of upper-outer quadrant of right female breast: Secondary | ICD-10-CM

## 2024-02-17 NOTE — Progress Notes (Signed)
  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    First Treatment Date: 2023-12-05 Last Treatment Date: 2024-01-20   Plan Name: Breast_R Site: Breast, Right Technique: 3D Mode: Photon Dose Per Fraction: 1.8 Gy Prescribed Dose (Delivered / Prescribed): 50.4 Gy / 50.4 Gy Prescribed Fxs (Delivered / Prescribed): 28 / 28   Plan Name: Breast_R_SCV Site: Breast, Right Technique: 3D Mode: Photon Dose Per Fraction: 1.8 Gy Prescribed Dose (Delivered / Prescribed): 50.4 Gy / 50.4 Gy Prescribed Fxs (Delivered / Prescribed): 28 / 28   Plan Name: Breast_R_Bst Site: Breast, Right Technique: 3D Mode: Photon Dose Per Fraction: 2 Gy Prescribed Dose (Delivered / Prescribed): 10 Gy / 10 Gy Prescribed Fxs (Delivered / Prescribed): 5 / 5      The patient was available for call today.   Symptoms of fatigue have improved since completing therapy.  Symptoms of skin changes have improved since completing therapy.  The patient was encouraged to avoid sun exposure in the area of prior treatment for up to one year following radiation with either sunscreen or by the style of clothing worn in the sun.  The patient has scheduled follow up with her medical oncologist Dr. Loretha for ongoing surveillance, and was encouraged to call if she develops concerns or questions regarding radiation.

## 2024-02-17 NOTE — Therapy (Signed)
 OUTPATIENT PHYSICAL THERAPY  UPPER EXTREMITY ONCOLOGY TREATMENT   Patient Name: Brittany [name] MRN: [id number] DOB:April 09, 1956, 68 y.o., female Today's Date: 02/17/2024  END OF SESSION:  PT End of Session - 02/17/24 0901     Visit Number 9    Number of Visits 19    Date for Recertification  03/09/24    Authorization Type not needed    PT Start Time 0900    PT Stop Time 0956    PT Time Calculation (min) 56 min    Activity Tolerance Patient tolerated treatment well    Behavior During Therapy [facility] for tasks assessed/performed          Past Medical History:  Diagnosis Date   Anxiety and depression    Elevated LDL cholesterol level    no meds   Elevated liver enzymes 07/23/2021   GERD (gastroesophageal reflux disease)    Malignant neoplasm of breast (female) (HCC)    Pre-diabetes    no meds, does not check blood sugar   Thyroid  disorder 11/14/2017   Past Surgical History:  Procedure Laterality Date   AXILLARY LYMPH NODE DISSECTION Right 05/18/2023   Procedure: RIGHT AXILLARY LYMPH NODE DISSECTION;  Surgeon: Aron Shoulders, MD;  Location: [facility] OR;  Service: General;  Laterality: Right;  PEC BLOCK   BREAST BIOPSY Right 03/09/2023   US  RT BREAST BX W LOC DEV 1ST LESION IMG BX SPEC US  GUIDE 03/09/2023 [facility] MAMMOGRAPHY   BREAST BIOPSY  04/12/2023   US  RT RADIOACTIVE SEED LOC 04/12/2023 [facility] ULTRASOUND   BREAST LUMPECTOMY WITH RADIOACTIVE SEED AND SENTINEL LYMPH NODE BIOPSY Right 04/13/2023   Procedure: RIGHT BREAST SEED LOCALIZED LUMPECTOMY WITH SENTINEL NODE BIOPSY;  Surgeon: Aron Shoulders, MD;  Location: [facility];  Service: General;  Laterality: Right;   COLONOSCOPY  06/08/2017   HERNIA REPAIR     LAPAROSCOPIC HYSTERECTOMY  03/2017   Partial, pt still has both adnexa   PORT-A-CATH REMOVAL N/A 02/08/2024   Procedure: REMOVAL PORT-A-CATH;  Surgeon: Aron Shoulders, MD;  Location: [facility] OR;  Service: General;  Laterality: N/A;   PORTACATH PLACEMENT N/A 05/18/2023    Procedure: PORT PLACEMENT WITH ULTRASOUND GUIDANCE;  Surgeon: Aron Shoulders, MD;  Location: [facility] OR;  Service: General;  Laterality: N/A;   Patient Active Problem List   Diagnosis Date Noted   Fatigue 01/26/2024   Osteoporosis screening 01/26/2024   Dysuria 01/26/2024   Port-A-Cath in place 05/31/2023   Genetic testing 03/28/2023   Family history of breast cancer 03/16/2023   Malignant neoplasm of upper-outer quadrant of right breast in female, estrogen receptor positive (HCC) 03/14/2023   Arthralgia of right knee 01/28/2023   Pneumococcal vaccination administered at current visit 01/28/2023   Decreased GFR 12/25/2021   Encounter for general adult medical examination with abnormal findings 07/23/2021   Overweight (BMI 25.0-29.9) 07/23/2021   Hyperlipidemia 07/23/2021   Prediabetes 07/23/2021   Ceruminosis, bilateral 02/18/2021   Vitamin D  deficiency 04/01/2020   Elevated LDL cholesterol level    Anxiety and depression 11/14/2017   Abnormal mammogram with microcalcification 11/14/2017   Thyroid  disorder 11/14/2017    PCP: Lauraine Pereyra, NP  REFERRING PROVIDER: Shoulders Aron, MD  REFERRING DIAG: Diagnosis C50.411 (ICD-10-CM) - Malignant neoplasm of upper-outer quadrant of right female breast Z17.0 (ICD-10-CM) - Estrogen receptor positive  THERAPY DIAG:  Aftercare following surgery for neoplasm  Malignant neoplasm of upper-outer quadrant of right breast in female, estrogen receptor positive (HCC)  Abnormal posture  Lymphedema, not elsewhere classified  ONSET DATE:  02/22/23  Rationale for Evaluation and Treatment: Rehabilitation  SUBJECTIVE:                                                                                                                                                                                           SUBJECTIVE STATEMENT:  I left the bandages on until last night. I took them off because my arm was getting a little itchy and I wanted to wash the  bandages.   EVAL I just finished radiation.  I think the arm is swelling .  I think the lower arm is the most swollen.  Does not have a glove or hand piece.  The axilla is not feeling swollen.  The breast isn't feeling swollen.    PERTINENT HISTORY: Patient was diagnosed on 02/22/2023 with right grade 2 invasive ductal carcinoma breast cancer. It measures 1.3 cm and is located in the upper outer quadrant. It is ER/PR positive and HER2 negative with a Ki67 of 5% . She is now s/p Right lumpectomy with SLNB and 4+/5 LN's on 04/13/2023. She  had an ALND on 05/17/2022 with 5+/13 LN'. Completed chemotherapy, and radiation.  PAIN:  Are you having pain?  PAIN:  Are you having pain? No   PRECAUTIONS: lymphedema risk   RED FLAGS: None   WEIGHT BEARING RESTRICTIONS: No  FALLS:  Has patient fallen in last 6 months? No  LIVING ENVIRONMENT: Lives with: lives alone Lives in: House/apartment  OCCUPATION: retired  LEISURE: walking, yoga  HAND DOMINANCE: right   PRIOR LEVEL OF FUNCTION: Independent  PATIENT GOALS: decrease the swelling    OBJECTIVE: Note: Objective measures were completed at Evaluation unless otherwise noted.  COGNITION: Overall cognitive status: Within functional limits for tasks assessed   PALPATION: +1 pitting to the forearm but already very firm   OBSERVATIONS / OTHER ASSESSMENTS: Rt arm visibly larger than the Lt   UPPER EXTREMITY AROM/PROM: WNL - reports no issues  LYMPHEDEMA ASSESSMENTS:  LYMPHEDEMA ASSESSMENTS (in cm):    LANDMARK RIGHT   eval RIGHT 01/27/24 02/01/24 02/10/24 02/15/24  15 cm proximal to olecranon process   32.4 29.6  29.6 29.3  10 cm proximal to olecranon process 30.3 29.7 31.8 29.1 29.2 28.3  Olecranon process 24.5 24.6 27.2 25.2 25.1 24.2  15 cm proximal to ulnar styloid process   27.9 26.2 26.6 25.8  10 cm proximal to ulnar styloid process 23.2 23.4 24.1 24.4 24.8 24.3  Just proximal to ulnar styloid process 15.7 15.7 17.7 16.7 16.7 16   Across hand at thumb web space 19.2 20.3 21.3 19.5 19.3 18.8  At base of 2nd digit 6.4  6.4 7.0 6.6 6.4 6.2  (Blank rows = not tested)   LANDMARK LEFT   eval 01/27/24  15 cm proximal to olecranon process  32.1  10 cm proximal to olecranon process  31.7  Olecranon process  26.1  15 cm proximal to ulna styloid process  24.8  10 cm proximal to ulnar styloid process  21.7  Just proximal to ulnar styloid process  16.6  Across hand at thumb web space  20.2  At base of 2nd digit  7.0  (Blank rows = not tested)  Rt volume: Lt volume:  Difference of  L-DEX LYMPHEDEMA SCREENING: L-DEX LYMPHEDEMA SCREENING Measurement Type: Unilateral L-DEX MEASUREMENT EXTREMITY: Upper Extremity POSITION : Standing DOMINANT SIDE: Right At Risk Side: Right BASELINE SCORE (UNILATERAL): 2.1 L-DEX SCORE (UNILATERAL): 31.3 VALUE CHANGE (UNILAT): 29.2                                                                                                                             TREATMENT DATE:  02/17/24: Manual Therapy  MLD (done by Randall Pack, SPT with supervision throughout by PTA) to Rt UE in supine: Short neck, superficial and deep abdominals, Lt axillary nodes and establishment of anterior interaxillary pathway, Rt inguinal nodes and establishment of Rt axilloinguinal pathway, then Rt UE working proximal to distal, moving inner upper arm outwards and upwards, and doing both sides of forearms, spending extra time in any areas of fibrosis then retracing all steps.  Compression Bandaging to Rt UE: Cocoa butter to arm, tg soft to the arm, Finger wrap to fingers 1-4 starting closer to PIP, artiflex from hand to axilla, 6cm hand bandage, and then 10cm wrist to axilla with X at elbow for first, and 2nd 10 cm wrist to axilla with TG soft pulled over.  02/15/24: Manual Therapy  MLD to Rt UE in supine: Short neck, superficial and deep abdominals, Lt axillary and pectoral nodes and establishment of  anterior interaxillary pathway, Rt inguinal nodes and establishment of Rt axilloinguinal pathway, then Rt UE working proximal to distal, moving inner upper arm outwards and upwards, and doing both sides of forearms, spending extra time in any areas of fibrosis then retracing all steps.  Compression Bandaging to Rt UE: Cocoa butter to arm, tg soft to the arm, Finger wrap to fingers 1-4 starting closer to PIP, artiflex from hand to axilla, 6cm hand bandage, and then 10cm wrist to axilla with X at elbow for first, and 2nd 10 cm wrist to axilla with TG soft pulled over.  02/13/2024 Manual Therapy MLD modified due to shingles at left chest/shoulder  MLD to Rt UE in supine: Short neck, superficial and deep abdominals, Lt axillary and pectoral nodes and establishment of POSTERIOR interaxillary pathway, Rt inguinal nodes and establishment of Rt axilloinguinal pathway, then Rt UE working proximal to distal, moving inner upper arm outwards and upwards, and doing both sides of forearms, spending extra time in any areas of fibrosis  then retracing all steps.  Compression Bandaging to Rt UE: Cocoa butter to arm, tg soft to the arm, Finger wrap to fingers 1-4, artiflex from hand to axilla, 6cm hand bandage, and then 10cm wrist to axilla with X at elbow for first, and 2nd 10 cm wrist to axilla with TG soft pulled over.       PATIENT EDUCATION:  Education details: Compression bandaging Person educated: Patient Education method: Chief Technology Officer Education comprehension: verbalized understanding  HOME EXERCISE PROGRAM:   ASSESSMENT:  CLINICAL IMPRESSION: Pt tolerating compression bandaging well. Pt had put her compression sleeve on last night after doffing bandages and slept in sleeve. Reminded pt that she should not sleep in compression sleeve due to high resting pressure and she verbalized understanding. Discussed with pt how we can measure her for her new compression garments once maximal  circumferential reductions attained. She would like to cont bandaging at least another week or so and then determine from there.    OBJECTIVE IMPAIRMENTS: decreased knowledge of condition, decreased knowledge of use of DME, and increased edema.   ACTIVITY LIMITATIONS: none  PARTICIPATION LIMITATIONS: none  PERSONAL FACTORS: 1-2 comorbidities: ALND and radiation are also affecting patient's functional outcome.   REHAB POTENTIAL: Excellent  CLINICAL DECISION MAKING: Stable/uncomplicated  EVALUATION COMPLEXITY: Low  GOALS: Goals reviewed with patient? Yes  SHORT TERM GOALS: Target date: 02/22/24  Pt will be ind with self bandaging of the Rt arm to improve independence with lymphedema care Baseline: Goal status: INITIAL   LONG TERM GOALS: Target date: 03/09/24  Pt will be ind with self MLD for lymphedema maintenance or pursue compression pump Baseline:  Goal status: INITIAL  2.  Pt will decrease volume in the limb by at least 50ml to decrease infection risk  Baseline:  Goal status: INITIAL  3.  Pt will be measured for or obtain final compression garments for lymphedema maintenance  Baseline:  Goal status: INITIAL   PLAN:  PT FREQUENCY: 2-3x per week   PT DURATION: 6 weeks  PLANNED INTERVENTIONS: 97110-Therapeutic exercises, 97530- Therapeutic activity, 97112- Neuromuscular re-education, 97535- Self Care, [id number]- Manual therapy, [phone]- Orthotic Initial, [id number]- Prosthetic Initial , [phone]- Orthotic/Prosthetic subsequent, Patient/Family education, Manual lymph drainage, Therapeutic exercises, Therapeutic activity, Neuromuscular re-education, Gait training, and Self Care  PLAN FOR NEXT SESSION: Cont CDT of Rt UE, cont to review self MLD while performing and assess her bandage when she returns wrapped in bandage of her doing; pt probable to be ready to be measured in next 1-2 weeks once her circumferential measurements have maintained  Aden Berwyn Caldron,  PTA 02/17/2024, 10:05 AM

## 2024-02-20 ENCOUNTER — Telehealth: Payer: Self-pay | Admitting: Hematology and Oncology

## 2024-02-20 ENCOUNTER — Encounter: Payer: Self-pay | Admitting: Rehabilitation

## 2024-02-20 ENCOUNTER — Ambulatory Visit: Admitting: Rehabilitation

## 2024-02-20 ENCOUNTER — Ambulatory Visit
Admission: RE | Admit: 2024-02-20 | Discharge: 2024-02-20 | Disposition: A | Discharge status: Home / Self Care | Source: Ambulatory Visit | Attending: Radiation Oncology | Admitting: Radiation Oncology

## 2024-02-20 DIAGNOSIS — C50411 Malignant neoplasm of upper-outer quadrant of right female breast: Secondary | ICD-10-CM | POA: Insufficient documentation

## 2024-02-20 DIAGNOSIS — Z170 Estrogen receptor positive status [ER+]: Secondary | ICD-10-CM

## 2024-02-20 DIAGNOSIS — Z483 Aftercare following surgery for neoplasm: Secondary | ICD-10-CM

## 2024-02-20 DIAGNOSIS — I890 Lymphedema, not elsewhere classified: Secondary | ICD-10-CM

## 2024-02-20 DIAGNOSIS — R293 Abnormal posture: Secondary | ICD-10-CM

## 2024-02-20 NOTE — Telephone Encounter (Signed)
 Left voicemail for patient regarding rescheduled appointment from 03/02/2024 to 03/08/2024.

## 2024-02-20 NOTE — Therapy (Signed)
 OUTPATIENT PHYSICAL THERAPY  UPPER EXTREMITY ONCOLOGY TREATMENT   Patient Name: Brittany [name] MRN: [id number] DOB:October 13, 1955, 68 y.o., female Today's Date: 02/20/2024  END OF SESSION:  PT End of Session - 02/20/24 0900     Visit Number 10    Number of Visits 19    Date for Recertification  03/09/24    Authorization Type not needed    PT Start Time 0900    PT Stop Time 0957    PT Time Calculation (min) 57 min    Activity Tolerance Patient tolerated treatment well    Behavior During Therapy [facility] for tasks assessed/performed          Past Medical History:  Diagnosis Date   Anxiety and depression    Elevated LDL cholesterol level    no meds   Elevated liver enzymes 07/23/2021   GERD (gastroesophageal reflux disease)    Malignant neoplasm of breast (female) (HCC)    Pre-diabetes    no meds, does not check blood sugar   Thyroid  disorder 11/14/2017   Past Surgical History:  Procedure Laterality Date   AXILLARY LYMPH NODE DISSECTION Right 05/18/2023   Procedure: RIGHT AXILLARY LYMPH NODE DISSECTION;  Surgeon: Aron Shoulders, MD;  Location: [facility] OR;  Service: General;  Laterality: Right;  PEC BLOCK   BREAST BIOPSY Right 03/09/2023   US  RT BREAST BX W LOC DEV 1ST LESION IMG BX SPEC US  GUIDE 03/09/2023 [facility] MAMMOGRAPHY   BREAST BIOPSY  04/12/2023   US  RT RADIOACTIVE SEED LOC 04/12/2023 [facility] ULTRASOUND   BREAST LUMPECTOMY WITH RADIOACTIVE SEED AND SENTINEL LYMPH NODE BIOPSY Right 04/13/2023   Procedure: RIGHT BREAST SEED LOCALIZED LUMPECTOMY WITH SENTINEL NODE BIOPSY;  Surgeon: Aron Shoulders, MD;  Location: [facility];  Service: General;  Laterality: Right;   COLONOSCOPY  06/08/2017   HERNIA REPAIR     LAPAROSCOPIC HYSTERECTOMY  03/2017   Partial, pt still has both adnexa   PORT-A-CATH REMOVAL N/A 02/08/2024   Procedure: REMOVAL PORT-A-CATH;  Surgeon: Aron Shoulders, MD;  Location: [facility] OR;  Service: General;  Laterality: N/A;   PORTACATH PLACEMENT N/A  05/18/2023   Procedure: PORT PLACEMENT WITH ULTRASOUND GUIDANCE;  Surgeon: Aron Shoulders, MD;  Location: [facility] OR;  Service: General;  Laterality: N/A;   Patient Active Problem List   Diagnosis Date Noted   Fatigue 01/26/2024   Osteoporosis screening 01/26/2024   Dysuria 01/26/2024   Port-A-Cath in place 05/31/2023   Genetic testing 03/28/2023   Family history of breast cancer 03/16/2023   Malignant neoplasm of upper-outer quadrant of right breast in female, estrogen receptor positive (HCC) 03/14/2023   Arthralgia of right knee 01/28/2023   Pneumococcal vaccination administered at current visit 01/28/2023   Decreased GFR 12/25/2021   Encounter for general adult medical examination with abnormal findings 07/23/2021   Overweight (BMI 25.0-29.9) 07/23/2021   Hyperlipidemia 07/23/2021   Prediabetes 07/23/2021   Ceruminosis, bilateral 02/18/2021   Vitamin D  deficiency 04/01/2020   Elevated LDL cholesterol level    Anxiety and depression 11/14/2017   Abnormal mammogram with microcalcification 11/14/2017   Thyroid  disorder 11/14/2017    PCP: Lauraine Pereyra, NP  REFERRING PROVIDER: Shoulders Aron, MD  REFERRING DIAG: Diagnosis C50.411 (ICD-10-CM) - Malignant neoplasm of upper-outer quadrant of right female breast Z17.0 (ICD-10-CM) - Estrogen receptor positive  THERAPY DIAG:  Malignant neoplasm of upper-outer quadrant of right breast in female, estrogen receptor positive (HCC)  Aftercare following surgery for neoplasm  Abnormal posture  Lymphedema, not elsewhere classified  ONSET DATE:  02/22/23  Rationale for Evaluation and Treatment: Rehabilitation  SUBJECTIVE:                                                                                                                                                                                           SUBJECTIVE STATEMENT:  Pt reports she took the bandage off earlier this morning. She states the swelling is about the same or getting a  little better; she would like to stop the compression wrapping at the start of November.   EVAL I just finished radiation.  I think the arm is swelling .  I think the lower arm is the most swollen.  Does not have a glove or hand piece.  The axilla is not feeling swollen.  The breast isn't feeling swollen.    PERTINENT HISTORY: Patient was diagnosed on 02/22/2023 with right grade 2 invasive ductal carcinoma breast cancer. It measures 1.3 cm and is located in the upper outer quadrant. It is ER/PR positive and HER2 negative with a Ki67 of 5% . She is now s/p Right lumpectomy with SLNB and 4+/5 LN's on 04/13/2023. She  had an ALND on 05/17/2022 with 5+/13 LN'. Completed chemotherapy, and radiation.  PAIN:  Are you having pain?  PAIN:  Are you having pain? No  PRECAUTIONS: lymphedema risk   RED FLAGS: None   WEIGHT BEARING RESTRICTIONS: No  FALLS:  Has patient fallen in last 6 months? No  LIVING ENVIRONMENT: Lives with: lives alone Lives in: House/apartment  OCCUPATION: retired  LEISURE: walking, yoga  HAND DOMINANCE: right   PRIOR LEVEL OF FUNCTION: Independent  PATIENT GOALS: decrease the swelling    OBJECTIVE: Note: Objective measures were completed at Evaluation unless otherwise noted.  COGNITION: Overall cognitive status: Within functional limits for tasks assessed   PALPATION: +1 pitting to the forearm but already very firm   OBSERVATIONS / OTHER ASSESSMENTS: Rt arm visibly larger than the Lt   UPPER EXTREMITY AROM/PROM: WNL - reports no issues  LYMPHEDEMA ASSESSMENTS:  LYMPHEDEMA ASSESSMENTS (in cm):    LANDMARK RIGHT   eval RIGHT 01/27/24 02/01/24 02/10/24 02/15/24 02/20/24  15 cm proximal to olecranon process   32.4 29.6  29.6 29.3 31.6  10 cm proximal to olecranon process 30.3 29.7 31.8 29.1 29.2 28.3 29.7  Olecranon process 24.5 24.6 27.2 25.2 25.1 24.2 25.1  15 cm proximal to ulnar styloid process   27.9 26.2 26.6 25.8 26.5  10 cm proximal to ulnar styloid  process 23.2 23.4 24.1 24.4 24.8 24.3 24.3  Just proximal to ulnar styloid process 15.7 15.7 17.7 16.7 16.7 16 16.2  Across hand at thumb  web space 19.2 20.3 21.3 19.5 19.3 18.8 20.8  At base of 2nd digit 6.4 6.4 7.0 6.6 6.4 6.2 6.5  (Blank rows = not tested)   LANDMARK LEFT   eval 01/27/24  15 cm proximal to olecranon process  32.1  10 cm proximal to olecranon process  31.7  Olecranon process  26.1  15 cm proximal to ulna styloid process  24.8  10 cm proximal to ulnar styloid process  21.7  Just proximal to ulnar styloid process  16.6  Across hand at thumb web space  20.2  At base of 2nd digit  7.0  (Blank rows = not tested)  Rt volume: Lt volume:  Difference of  L-DEX LYMPHEDEMA SCREENING: L-DEX LYMPHEDEMA SCREENING Measurement Type: Unilateral L-DEX MEASUREMENT EXTREMITY: Upper Extremity POSITION : Standing DOMINANT SIDE: Right At Risk Side: Right BASELINE SCORE (UNILATERAL): 2.1 L-DEX SCORE (UNILATERAL): 31.3 VALUE CHANGE (UNILAT): 29.2                                                                                                                             TREATMENT DATE:  02/20/24: Manual Therapy  MLD to Rt UE in supine: Short neck, superficial and deep abdominals, Lt axillary nodes and establishment of anterior interaxillary pathway, Rt inguinal nodes and establishment of Rt axilloinguinal pathway, then Rt UE working proximal to distal, moving inner upper arm outwards and upwards, and doing both sides of forearms, spending extra time in any areas of fibrosis then retracing all steps.  Compression Bandaging to Rt UE: Cocoa butter to arm, tg soft to the arm, Finger wrap to fingers 1-4 starting closer to PIP, artiflex from hand to axilla, 6cm hand bandage, and then 10cm wrist to axilla with X at elbow for first, and 2nd 10 cm wrist to axilla with TG soft pulled over.  02/17/24: Manual Therapy  MLD (done by Randall Pack, SPT with supervision throughout  by PTA) to Rt UE in supine: Short neck, superficial and deep abdominals, Lt axillary nodes and establishment of anterior interaxillary pathway, Rt inguinal nodes and establishment of Rt axilloinguinal pathway, then Rt UE working proximal to distal, moving inner upper arm outwards and upwards, and doing both sides of forearms, spending extra time in any areas of fibrosis then retracing all steps.  Compression Bandaging to Rt UE: Cocoa butter to arm, tg soft to the arm, Finger wrap to fingers 1-4 starting closer to PIP, artiflex from hand to axilla, 6cm hand bandage, and then 10cm wrist to axilla with X at elbow for first, and 2nd 10 cm wrist to axilla with TG soft pulled over.  02/15/24: Manual Therapy  MLD to Rt UE in supine: Short neck, superficial and deep abdominals, Lt axillary and pectoral nodes and establishment of anterior interaxillary pathway, Rt inguinal nodes and establishment of Rt axilloinguinal pathway, then Rt UE working proximal to distal, moving inner upper arm outwards and upwards, and doing both sides of forearms, spending  extra time in any areas of fibrosis then retracing all steps.  Compression Bandaging to Rt UE: Cocoa butter to arm, tg soft to the arm, Finger wrap to fingers 1-4 starting closer to PIP, artiflex from hand to axilla, 6cm hand bandage, and then 10cm wrist to axilla with X at elbow for first, and 2nd 10 cm wrist to axilla with TG soft pulled over.  02/13/2024 Manual Therapy MLD modified due to shingles at left chest/shoulder  MLD to Rt UE in supine: Short neck, superficial and deep abdominals, Lt axillary and pectoral nodes and establishment of POSTERIOR interaxillary pathway, Rt inguinal nodes and establishment of Rt axilloinguinal pathway, then Rt UE working proximal to distal, moving inner upper arm outwards and upwards, and doing both sides of forearms, spending extra time in any areas of fibrosis then retracing all steps.  Compression Bandaging to Rt UE: Cocoa  butter to arm, tg soft to the arm, Finger wrap to fingers 1-4, artiflex from hand to axilla, 6cm hand bandage, and then 10cm wrist to axilla with X at elbow for first, and 2nd 10 cm wrist to axilla with TG soft pulled over.       PATIENT EDUCATION:  Education details: Compression bandaging Person educated: Patient Education method: Chief Technology Officer Education comprehension: verbalized understanding  HOME EXERCISE PROGRAM:   ASSESSMENT:  CLINICAL IMPRESSION: Manual therapy, including MLD, is utilized to assist with Rt UE lymphedema and swelling. Pt tolerating compression bandaging well with her wearing the bandage all weekend and taking it off this morning. Lymphedema measurements were re-measured with no significant changes in size noted. Patient agreed to continue compression bandaging for another week or two but then would like to order the compression garment.   OBJECTIVE IMPAIRMENTS: decreased knowledge of condition, decreased knowledge of use of DME, and increased edema.   ACTIVITY LIMITATIONS: none  PARTICIPATION LIMITATIONS: none  PERSONAL FACTORS: 1-2 comorbidities: ALND and radiation are also affecting patient's functional outcome.   REHAB POTENTIAL: Excellent  CLINICAL DECISION MAKING: Stable/uncomplicated  EVALUATION COMPLEXITY: Low  GOALS: Goals reviewed with patient? Yes  SHORT TERM GOALS: Target date: 02/22/24  Pt will be ind with self bandaging of the Rt arm to improve independence with lymphedema care Baseline: Goal status: INITIAL   LONG TERM GOALS: Target date: 03/09/24  Pt will be ind with self MLD for lymphedema maintenance or pursue compression pump Baseline:  Goal status: INITIAL  2.  Pt will decrease volume in the limb by at least 50ml to decrease infection risk  Baseline:  Goal status: INITIAL  3.  Pt will be measured for or obtain final compression garments for lymphedema maintenance  Baseline:  Goal status:  INITIAL   PLAN:  PT FREQUENCY: 2-3x per week   PT DURATION: 6 weeks  PLANNED INTERVENTIONS: 97110-Therapeutic exercises, 97530- Therapeutic activity, 97112- Neuromuscular re-education, 97535- Self Care, [id number]- Manual therapy, [phone]- Orthotic Initial, [phone]- Prosthetic Initial , [phone]- Orthotic/Prosthetic subsequent, Patient/Family education, Manual lymph drainage, Therapeutic exercises, Therapeutic activity, Neuromuscular re-education, Gait training, and Self Care  PLAN FOR NEXT SESSION: Cont CDT of Rt UE, cont to review self MLD while performing and assess her bandage when she returns wrapped in bandage of her doing; pt probable to be ready to be measured in next 1-2 weeks once her circumferential measurements have maintained  Randall Pack, SPT 02/20/2024, 12:30 PM I agree with the following treatment note after reviewing documentation. This session was performed under the supervision of a licensed clinician.  Saddie Raw, PT 02/20/24, 2:27  PM

## 2024-02-22 ENCOUNTER — Ambulatory Visit

## 2024-02-22 DIAGNOSIS — Z483 Aftercare following surgery for neoplasm: Secondary | ICD-10-CM | POA: Diagnosis not present

## 2024-02-22 DIAGNOSIS — I890 Lymphedema, not elsewhere classified: Secondary | ICD-10-CM

## 2024-02-22 DIAGNOSIS — R293 Abnormal posture: Secondary | ICD-10-CM

## 2024-02-22 DIAGNOSIS — C50411 Malignant neoplasm of upper-outer quadrant of right female breast: Secondary | ICD-10-CM

## 2024-02-22 NOTE — Therapy (Signed)
 OUTPATIENT PHYSICAL THERAPY  UPPER EXTREMITY ONCOLOGY TREATMENT   Patient Name: Brittany [name] MRN: [id number] DOB:[birth year], 68 y.o., female Today's Date: 02/22/2024  END OF SESSION:  PT End of Session - 02/22/24 1456     Visit Number 11    Number of Visits 19    Date for Recertification  03/09/24    Authorization Type not needed    PT Start Time 1403    PT Stop Time 1457    PT Time Calculation (min) 54 min    Activity Tolerance Patient tolerated treatment well    Behavior During Therapy [facility] for tasks assessed/performed          Past Medical History:  Diagnosis Date   Anxiety and depression    Elevated LDL cholesterol level    no meds   Elevated liver enzymes 07/23/2021   GERD (gastroesophageal reflux disease)    Malignant neoplasm of breast (female) (HCC)    Pre-diabetes    no meds, does not check blood sugar   Thyroid  disorder 11/14/2017   Past Surgical History:  Procedure Laterality Date   AXILLARY LYMPH NODE DISSECTION Right 05/18/2023   Procedure: RIGHT AXILLARY LYMPH NODE DISSECTION;  Surgeon: Aron Shoulders, MD;  Location: [facility] OR;  Service: General;  Laterality: Right;  PEC BLOCK   BREAST BIOPSY Right 03/09/2023   US  RT BREAST BX W LOC DEV 1ST LESION IMG BX SPEC US  GUIDE 03/09/2023 [facility] MAMMOGRAPHY   BREAST BIOPSY  04/12/2023   US  RT RADIOACTIVE SEED LOC 04/12/2023 [facility] ULTRASOUND   BREAST LUMPECTOMY WITH RADIOACTIVE SEED AND SENTINEL LYMPH NODE BIOPSY Right 04/13/2023   Procedure: RIGHT BREAST SEED LOCALIZED LUMPECTOMY WITH SENTINEL NODE BIOPSY;  Surgeon: Aron Shoulders, MD;  Location: [facility];  Service: General;  Laterality: Right;   COLONOSCOPY  06/08/2017   HERNIA REPAIR     LAPAROSCOPIC HYSTERECTOMY  03/2017   Partial, pt still has both adnexa   PORT-A-CATH REMOVAL N/A 02/08/2024   Procedure: REMOVAL PORT-A-CATH;  Surgeon: Aron Shoulders, MD;  Location: [facility] OR;  Service: General;  Laterality: N/A;   PORTACATH PLACEMENT N/A  05/18/2023   Procedure: PORT PLACEMENT WITH ULTRASOUND GUIDANCE;  Surgeon: Aron Shoulders, MD;  Location: [facility] OR;  Service: General;  Laterality: N/A;   Patient Active Problem List   Diagnosis Date Noted   Fatigue 01/26/2024   Osteoporosis screening 01/26/2024   Dysuria 01/26/2024   Port-A-Cath in place 05/31/2023   Genetic testing 03/28/2023   Family history of breast cancer 03/16/2023   Malignant neoplasm of upper-outer quadrant of right breast in female, estrogen receptor positive (HCC) 03/14/2023   Arthralgia of right knee 01/28/2023   Pneumococcal vaccination administered at current visit 01/28/2023   Decreased GFR 12/25/2021   Encounter for general adult medical examination with abnormal findings 07/23/2021   Overweight (BMI 25.0-29.9) 07/23/2021   Hyperlipidemia 07/23/2021   Prediabetes 07/23/2021   Ceruminosis, bilateral 02/18/2021   Vitamin D  deficiency 04/01/2020   Elevated LDL cholesterol level    Anxiety and depression 11/14/2017   Abnormal mammogram with microcalcification 11/14/2017   Thyroid  disorder 11/14/2017    PCP: Lauraine Pereyra, NP  REFERRING PROVIDER: Shoulders Aron, MD  REFERRING DIAG: Diagnosis C50.411 (ICD-10-CM) - Malignant neoplasm of upper-outer quadrant of right female breast Z17.0 (ICD-10-CM) - Estrogen receptor positive  THERAPY DIAG:  Malignant neoplasm of upper-outer quadrant of right breast in female, estrogen receptor positive (HCC)  Aftercare following surgery for neoplasm  Abnormal posture  Lymphedema, not elsewhere classified  ONSET DATE:  02/22/23  Rationale for Evaluation and Treatment: Rehabilitation  SUBJECTIVE:                                                                                                                                                                                           SUBJECTIVE STATEMENT:  Pt reports she took the bandage off earlier this morning. She states the swelling is about the same or getting a  little better; she would like to stop the compression wrapping at the start of November.   EVAL I just finished radiation.  I think the arm is swelling .  I think the lower arm is the most swollen.  Does not have a glove or hand piece.  The axilla is not feeling swollen.  The breast isn't feeling swollen.    PERTINENT HISTORY: Patient was diagnosed on 02/22/2023 with right grade 2 invasive ductal carcinoma breast cancer. It measures 1.3 cm and is located in the upper outer quadrant. It is ER/PR positive and HER2 negative with a Ki67 of 5% . She is now s/p Right lumpectomy with SLNB and 4+/5 LN's on 04/13/2023. She  had an ALND on 05/17/2022 with 5+/13 LN'. Completed chemotherapy, and radiation.  PAIN:  Are you having pain?  PAIN:  Are you having pain? No  PRECAUTIONS: lymphedema risk   RED FLAGS: None   WEIGHT BEARING RESTRICTIONS: No  FALLS:  Has patient fallen in last 6 months? No  LIVING ENVIRONMENT: Lives with: lives alone Lives in: House/apartment  OCCUPATION: retired  LEISURE: walking, yoga  HAND DOMINANCE: right   PRIOR LEVEL OF FUNCTION: Independent  PATIENT GOALS: decrease the swelling    OBJECTIVE: Note: Objective measures were completed at Evaluation unless otherwise noted.  COGNITION: Overall cognitive status: Within functional limits for tasks assessed   PALPATION: +1 pitting to the forearm but already very firm   OBSERVATIONS / OTHER ASSESSMENTS: Rt arm visibly larger than the Lt   UPPER EXTREMITY AROM/PROM: WNL - reports no issues  LYMPHEDEMA ASSESSMENTS:  LYMPHEDEMA ASSESSMENTS (in cm):    LANDMARK RIGHT   eval RIGHT 01/27/24 02/01/24 02/10/24 02/15/24 02/20/24  15 cm proximal to olecranon process   32.4 29.6  29.6 29.3 31.6  10 cm proximal to olecranon process 30.3 29.7 31.8 29.1 29.2 28.3 29.7  Olecranon process 24.5 24.6 27.2 25.2 25.1 24.2 25.1  15 cm proximal to ulnar styloid process   27.9 26.2 26.6 25.8 26.5  10 cm proximal to ulnar styloid  process 23.2 23.4 24.1 24.4 24.8 24.3 24.3  Just proximal to ulnar styloid process 15.7 15.7 17.7 16.7 16.7 16 16.2  Across hand at thumb  web space 19.2 20.3 21.3 19.5 19.3 18.8 20.8  At base of 2nd digit 6.4 6.4 7.0 6.6 6.4 6.2 6.5  (Blank rows = not tested)   LANDMARK LEFT   eval 01/27/24  15 cm proximal to olecranon process  32.1  10 cm proximal to olecranon process  31.7  Olecranon process  26.1  15 cm proximal to ulna styloid process  24.8  10 cm proximal to ulnar styloid process  21.7  Just proximal to ulnar styloid process  16.6  Across hand at thumb web space  20.2  At base of 2nd digit  7.0  (Blank rows = not tested)  Rt volume: Lt volume:  Difference of  L-DEX LYMPHEDEMA SCREENING: L-DEX LYMPHEDEMA SCREENING Measurement Type: Unilateral L-DEX MEASUREMENT EXTREMITY: Upper Extremity POSITION : Standing DOMINANT SIDE: Right At Risk Side: Right BASELINE SCORE (UNILATERAL): 2.1 L-DEX SCORE (UNILATERAL): 31.3 VALUE CHANGE (UNILAT): 29.2                                                                                                                             TREATMENT DATE:  02/22/24: Manual Therapy  MLD to Rt UE in supine: Short neck, superficial and deep abdominals, Lt axillary and pectoral nodes and establishment of anterior interaxillary pathway, Rt inguinal nodes and establishment of Rt axilloinguinal pathway, then Rt UE working proximal to distal, moving inner upper arm outwards and upwards, and doing both sides of forearms, spending extra time in any areas of fibrosis then retracing all steps.  Compression Bandaging to Rt UE: Cocoa butter to arm, tg soft to the arm, Finger wrap to fingers 1-4 starting closer to PIP, artiflex from hand to axilla, 6cm hand bandage, and then 10cm wrist to axilla with X at elbow for first, and 2nd 10 cm wrist to axilla with TG soft pulled over.  02/20/24: Manual Therapy  MLD to Rt UE in supine: Short neck,  superficial and deep abdominals, Lt axillary nodes and establishment of anterior interaxillary pathway, Rt inguinal nodes and establishment of Rt axilloinguinal pathway, then Rt UE working proximal to distal, moving inner upper arm outwards and upwards, and doing both sides of forearms, spending extra time in any areas of fibrosis then retracing all steps.  Compression Bandaging to Rt UE: Cocoa butter to arm, tg soft to the arm, Finger wrap to fingers 1-4 starting closer to PIP, artiflex from hand to axilla, 6cm hand bandage, and then 10cm wrist to axilla with X at elbow for first, and 2nd 10 cm wrist to axilla with TG soft pulled over.  02/17/24: Manual Therapy  MLD (done by Randall Pack, SPT with supervision throughout by PTA) to Rt UE in supine: Short neck, superficial and deep abdominals, Lt axillary nodes and establishment of anterior interaxillary pathway, Rt inguinal nodes and establishment of Rt axilloinguinal pathway, then Rt UE working proximal to distal, moving inner upper arm outwards and upwards, and doing both sides of forearms, spending  extra time in any areas of fibrosis then retracing all steps.  Compression Bandaging to Rt UE: Cocoa butter to arm, tg soft to the arm, Finger wrap to fingers 1-4 starting closer to PIP, artiflex from hand to axilla, 6cm hand bandage, and then 10cm wrist to axilla with X at elbow for first, and 2nd 10 cm wrist to axilla with TG soft pulled over.  02/15/24: Manual Therapy  MLD to Rt UE in supine: Short neck, superficial and deep abdominals, Lt axillary and pectoral nodes and establishment of anterior interaxillary pathway, Rt inguinal nodes and establishment of Rt axilloinguinal pathway, then Rt UE working proximal to distal, moving inner upper arm outwards and upwards, and doing both sides of forearms, spending extra time in any areas of fibrosis then retracing all steps.  Compression Bandaging to Rt UE: Cocoa butter to arm, tg soft to the arm, Finger wrap to  fingers 1-4 starting closer to PIP, artiflex from hand to axilla, 6cm hand bandage, and then 10cm wrist to axilla with X at elbow for first, and 2nd 10 cm wrist to axilla with TG soft pulled over.        PATIENT EDUCATION:  Education details: Compression bandaging Person educated: Patient Education method: Chief Technology Officer Education comprehension: verbalized understanding  HOME EXERCISE PROGRAM:   ASSESSMENT:  CLINICAL IMPRESSION: Pt was set up to be measured for new compression garments 10/29 and R/S her PT appt for that day for right after measurement. Today continued with CDT of Rt UE.   OBJECTIVE IMPAIRMENTS: decreased knowledge of condition, decreased knowledge of use of DME, and increased edema.   ACTIVITY LIMITATIONS: none  PARTICIPATION LIMITATIONS: none  PERSONAL FACTORS: 1-2 comorbidities: ALND and radiation are also affecting patient's functional outcome.   REHAB POTENTIAL: Excellent  CLINICAL DECISION MAKING: Stable/uncomplicated  EVALUATION COMPLEXITY: Low  GOALS: Goals reviewed with patient? Yes  SHORT TERM GOALS: Target date: 02/22/24  Pt will be ind with self bandaging of the Rt arm to improve independence with lymphedema care Baseline: Goal status: INITIAL   LONG TERM GOALS: Target date: 03/09/24  Pt will be ind with self MLD for lymphedema maintenance or pursue compression pump Baseline:  Goal status: INITIAL  2.  Pt will decrease volume in the limb by at least 50ml to decrease infection risk  Baseline:  Goal status: INITIAL  3.  Pt will be measured for or obtain final compression garments for lymphedema maintenance  Baseline:  Goal status: INITIAL   PLAN:  PT FREQUENCY: 2-3x per week   PT DURATION: 6 weeks  PLANNED INTERVENTIONS: 97110-Therapeutic exercises, 97530- Therapeutic activity, 97112- Neuromuscular re-education, 97535- Self Care, [id number]- Manual therapy, [phone]- Orthotic Initial, [id number]- Prosthetic Initial , [phone]-  Orthotic/Prosthetic subsequent, Patient/Family education, Manual lymph drainage, Therapeutic exercises, Therapeutic activity, Neuromuscular re-education, Gait training, and Self Care  PLAN FOR NEXT SESSION: Cont CDT of Rt UE, cont to review self MLD while performing and assess her bandage when she returns wrapped in bandage of her doing; pt scheduled to be measured for new compression garments 10/29 @ 1:30/PT treatment 2:00 that day.  Berwyn Knights, PTA 02/22/24 2:57 PM

## 2024-02-24 ENCOUNTER — Ambulatory Visit: Admitting: Rehabilitation

## 2024-02-24 ENCOUNTER — Encounter: Payer: Self-pay | Admitting: Rehabilitation

## 2024-02-24 DIAGNOSIS — I890 Lymphedema, not elsewhere classified: Secondary | ICD-10-CM

## 2024-02-24 DIAGNOSIS — C50411 Malignant neoplasm of upper-outer quadrant of right female breast: Secondary | ICD-10-CM

## 2024-02-24 DIAGNOSIS — R293 Abnormal posture: Secondary | ICD-10-CM

## 2024-02-24 DIAGNOSIS — Z483 Aftercare following surgery for neoplasm: Secondary | ICD-10-CM

## 2024-02-24 NOTE — Therapy (Addendum)
 OUTPATIENT PHYSICAL THERAPY  UPPER EXTREMITY ONCOLOGY TREATMENT   Patient Name: Brittany [name] MRN: [id number] DOB:[birth year], 68 y.o., female Today's Date: 02/24/2024  END OF SESSION:  PT End of Session - 02/24/24 1009     Visit Number 12    Number of Visits 19    Date for Recertification  03/09/24    Authorization Type not needed    PT Start Time 1009    PT Stop Time 1100    PT Time Calculation (min) 51 min    Activity Tolerance Patient tolerated treatment well    Behavior During Therapy WFL for tasks assessed/performed          Past Medical History:  Diagnosis Date   Anxiety and depression    Elevated LDL cholesterol level    no meds   Elevated liver enzymes 07/23/2021   GERD (gastroesophageal reflux disease)    Malignant neoplasm of breast (female) (HCC)    Pre-diabetes    no meds, does not check blood sugar   Thyroid  disorder 11/14/2017   Past Surgical History:  Procedure Laterality Date   AXILLARY LYMPH NODE DISSECTION Right 05/18/2023   Procedure: RIGHT AXILLARY LYMPH NODE DISSECTION;  Surgeon: Aron Shoulders, MD;  Location: [facility];  Service: General;  Laterality: Right;  PEC BLOCK   BREAST BIOPSY Right 03/09/2023   US  RT BREAST BX W LOC DEV 1ST LESION IMG BX SPEC US  GUIDE 03/09/2023 [facility] MAMMOGRAPHY   BREAST BIOPSY  04/12/2023   US  RT RADIOACTIVE SEED LOC 04/12/2023 [facility] ULTRASOUND   BREAST LUMPECTOMY WITH RADIOACTIVE SEED AND SENTINEL LYMPH NODE BIOPSY Right 04/13/2023   Procedure: RIGHT BREAST SEED LOCALIZED LUMPECTOMY WITH SENTINEL NODE BIOPSY;  Surgeon: Aron Shoulders, MD;  Location: [facility];  Service: General;  Laterality: Right;   COLONOSCOPY  06/08/2017   HERNIA REPAIR     LAPAROSCOPIC HYSTERECTOMY  03/2017   Partial, pt still has both adnexa   PORT-A-CATH REMOVAL N/A 02/08/2024   Procedure: REMOVAL PORT-A-CATH;  Surgeon: Aron Shoulders, MD;  Location: [facility];  Service: General;  Laterality: N/A;   PORTACATH PLACEMENT N/A  05/18/2023   Procedure: PORT PLACEMENT WITH ULTRASOUND GUIDANCE;  Surgeon: Aron Shoulders, MD;  Location: [facility];  Service: General;  Laterality: N/A;   Patient Active Problem List   Diagnosis Date Noted   Fatigue 01/26/2024   Osteoporosis screening 01/26/2024   Dysuria 01/26/2024   Port-A-Cath in place 05/31/2023   Genetic testing 03/28/2023   Family history of breast cancer 03/16/2023   Malignant neoplasm of upper-outer quadrant of right breast in female, estrogen receptor positive (HCC) 03/14/2023   Arthralgia of right knee 01/28/2023   Pneumococcal vaccination administered at current visit 01/28/2023   Decreased GFR 12/25/2021   Encounter for general adult medical examination with abnormal findings 07/23/2021   Overweight (BMI 25.0-29.9) 07/23/2021   Hyperlipidemia 07/23/2021   Prediabetes 07/23/2021   Ceruminosis, bilateral 02/18/2021   Vitamin D  deficiency 04/01/2020   Elevated LDL cholesterol level    Anxiety and depression 11/14/2017   Abnormal mammogram with microcalcification 11/14/2017   Thyroid  disorder 11/14/2017    PCP: Lauraine Pereyra, NP  REFERRING PROVIDER: Shoulders Aron, MD  REFERRING DIAG: Diagnosis C50.411 (ICD-10-CM) - Malignant neoplasm of upper-outer quadrant of right female breast Z17.0 (ICD-10-CM) - Estrogen receptor positive  THERAPY DIAG:  Malignant neoplasm of upper-outer quadrant of right breast in female, estrogen receptor positive (HCC)  Aftercare following surgery for neoplasm  Abnormal posture  Lymphedema, not elsewhere classified  ONSET DATE:  02/22/23  Rationale for Evaluation and Treatment: Rehabilitation  SUBJECTIVE:                                                                                                                                                                                           SUBJECTIVE STATEMENT:  Patient reports she took the bandage off this morning and has been wearing her compression sleeve since. She feels  like arm has stayed the same size for the past few visits. Patient reports the compression bandage is fine during the day but is hard to wear at night because of how hot it is.    EVAL I just finished radiation.  I think the arm is swelling .  I think the lower arm is the most swollen.  Does not have a glove or hand piece.  The axilla is not feeling swollen.  The breast isn't feeling swollen.    PERTINENT HISTORY: Patient was diagnosed on 02/22/2023 with right grade 2 invasive ductal carcinoma breast cancer. It measures 1.3 cm and is located in the upper outer quadrant. It is ER/PR positive and HER2 negative with a Ki67 of 5% . She is now s/p Right lumpectomy with SLNB and 4+/5 LN's on 04/13/2023. She  had an ALND on 05/17/2022 with 5+/13 LN'. Completed chemotherapy, and radiation.  PAIN:  Are you having pain?  PAIN:  Are you having pain? No  PRECAUTIONS: lymphedema risk   RED FLAGS: None   WEIGHT BEARING RESTRICTIONS: No  FALLS:  Has patient fallen in last 6 months? No  LIVING ENVIRONMENT: Lives with: lives alone Lives in: House/apartment  OCCUPATION: retired  LEISURE: walking, yoga  HAND DOMINANCE: right   PRIOR LEVEL OF FUNCTION: Independent  PATIENT GOALS: decrease the swelling    OBJECTIVE: Note: Objective measures were completed at Evaluation unless otherwise noted.  COGNITION: Overall cognitive status: Within functional limits for tasks assessed   PALPATION: +1 pitting to the forearm but already very firm   OBSERVATIONS / OTHER ASSESSMENTS: Rt arm visibly larger than the Lt   UPPER EXTREMITY AROM/PROM: WNL - reports no issues  LYMPHEDEMA ASSESSMENTS:  LYMPHEDEMA ASSESSMENTS (in cm):    LANDMARK RIGHT   eval RIGHT 01/27/24 02/01/24 02/10/24 02/15/24 02/20/24  15 cm proximal to olecranon process   32.4 29.6  29.6 29.3 31.6  10 cm proximal to olecranon process 30.3 29.7 31.8 29.1 29.2 28.3 29.7  Olecranon process 24.5 24.6 27.2 25.2 25.1 24.2 25.1  15 cm proximal  to ulnar styloid process   27.9 26.2 26.6 25.8 26.5  10 cm proximal to ulnar styloid process 23.2 23.4 24.1 24.4 24.8 24.3 24.3  Just proximal to ulnar styloid process 15.7 15.7 17.7 16.7 16.7 16 16.2  Across hand at thumb web space 19.2 20.3 21.3 19.5 19.3 18.8 20.8  At base of 2nd digit 6.4 6.4 7.0 6.6 6.4 6.2 6.5  (Blank rows = not tested)   LANDMARK LEFT   eval 01/27/24  15 cm proximal to olecranon process  32.1  10 cm proximal to olecranon process  31.7  Olecranon process  26.1  15 cm proximal to ulna styloid process  24.8  10 cm proximal to ulnar styloid process  21.7  Just proximal to ulnar styloid process  16.6  Across hand at thumb web space  20.2  At base of 2nd digit  7.0  (Blank rows = not tested)  Rt volume: Lt volume:  Difference of  L-DEX LYMPHEDEMA SCREENING: L-DEX LYMPHEDEMA SCREENING Measurement Type: Unilateral L-DEX MEASUREMENT EXTREMITY: Upper Extremity POSITION : Standing DOMINANT SIDE: Right At Risk Side: Right BASELINE SCORE (UNILATERAL): 2.1 L-DEX SCORE (UNILATERAL): 31.3 VALUE CHANGE (UNILAT): 29.2                                                                                                                             TREATMENT DATE:  02/24/24: Manual Therapy  MLD to Rt UE in supine: Short neck, superficial and deep abdominals, Lt axillary and pectoral nodes and establishment of anterior interaxillary pathway, Rt inguinal nodes and establishment of Rt axilloinguinal pathway, then Rt UE working proximal to distal, moving inner upper arm outwards and upwards, and doing both sides of forearms, spending extra time in any areas of fibrosis then retracing all steps.  Compression Bandaging to Rt UE: Cocoa butter to arm, tg soft to the arm, Finger wrap to fingers 1-4 starting closer to PIP, artiflex from hand to axilla, 6cm hand bandage, and then 10cm wrist to axilla with X at elbow for first, and 2nd 10 cm wrist to axilla with TG soft  pulled over.  02/22/24: Manual Therapy  MLD to Rt UE in supine: Short neck, superficial and deep abdominals, Lt axillary and pectoral nodes and establishment of anterior interaxillary pathway, Rt inguinal nodes and establishment of Rt axilloinguinal pathway, then Rt UE working proximal to distal, moving inner upper arm outwards and upwards, and doing both sides of forearms, spending extra time in any areas of fibrosis then retracing all steps.  Compression Bandaging to Rt UE: Cocoa butter to arm, tg soft to the arm, Finger wrap to fingers 1-4 starting closer to PIP, artiflex from hand to axilla, 6cm hand bandage, and then 10cm wrist to axilla with X at elbow for first, and 2nd 10 cm wrist to axilla with TG soft pulled over.  02/20/24: Manual Therapy  MLD to Rt UE in supine: Short neck, superficial and deep abdominals, Lt axillary nodes and establishment of anterior interaxillary pathway, Rt inguinal nodes and establishment of Rt axilloinguinal pathway, then Rt UE working proximal to distal, moving inner upper arm  outwards and upwards, and doing both sides of forearms, spending extra time in any areas of fibrosis then retracing all steps.  Compression Bandaging to Rt UE: Cocoa butter to arm, tg soft to the arm, Finger wrap to fingers 1-4 starting closer to PIP, artiflex from hand to axilla, 6cm hand bandage, and then 10cm wrist to axilla with X at elbow for first, and 2nd 10 cm wrist to axilla with TG soft pulled over.  02/17/24: Manual Therapy  MLD (done by Randall Pack, SPT with supervision throughout by PTA) to Rt UE in supine: Short neck, superficial and deep abdominals, Lt axillary nodes and establishment of anterior interaxillary pathway, Rt inguinal nodes and establishment of Rt axilloinguinal pathway, then Rt UE working proximal to distal, moving inner upper arm outwards and upwards, and doing both sides of forearms, spending extra time in any areas of fibrosis then retracing all steps.   Compression Bandaging to Rt UE: Cocoa butter to arm, tg soft to the arm, Finger wrap to fingers 1-4 starting closer to PIP, artiflex from hand to axilla, 6cm hand bandage, and then 10cm wrist to axilla with X at elbow for first, and 2nd 10 cm wrist to axilla with TG soft pulled over.  02/15/24: Manual Therapy  MLD to Rt UE in supine: Short neck, superficial and deep abdominals, Lt axillary and pectoral nodes and establishment of anterior interaxillary pathway, Rt inguinal nodes and establishment of Rt axilloinguinal pathway, then Rt UE working proximal to distal, moving inner upper arm outwards and upwards, and doing both sides of forearms, spending extra time in any areas of fibrosis then retracing all steps.  Compression Bandaging to Rt UE: Cocoa butter to arm, tg soft to the arm, Finger wrap to fingers 1-4 starting closer to PIP, artiflex from hand to axilla, 6cm hand bandage, and then 10cm wrist to axilla with X at elbow for first, and 2nd 10 cm wrist to axilla with TG soft pulled over.    PATIENT EDUCATION:  Education details: Compression bandaging Person educated: Patient Education method: Chief Technology Officer Education comprehension: verbalized understanding  HOME EXERCISE PROGRAM:   ASSESSMENT:  CLINICAL IMPRESSION: Patient tolerates the continuation of MLD and compression wrapping well. Patient is set to be measured for a new compression garment on 03/07/24. Patient would benefit from continued skilled physical therapy to address the increased edema in the Rt arm.   OBJECTIVE IMPAIRMENTS: decreased knowledge of condition, decreased knowledge of use of DME, and increased edema.   ACTIVITY LIMITATIONS: none  PARTICIPATION LIMITATIONS: none  PERSONAL FACTORS: 1-2 comorbidities: ALND and radiation are also affecting patient's functional outcome.   REHAB POTENTIAL: Excellent  CLINICAL DECISION MAKING: Stable/uncomplicated  EVALUATION COMPLEXITY: Low  GOALS: Goals  reviewed with patient? Yes  SHORT TERM GOALS: Target date: 02/22/24  Pt will be ind with self bandaging of the Rt arm to improve independence with lymphedema care Baseline: Goal status: INITIAL   LONG TERM GOALS: Target date: 03/09/24  Pt will be ind with self MLD for lymphedema maintenance or pursue compression pump Baseline:  Goal status: INITIAL  2.  Pt will decrease volume in the limb by at least 50ml to decrease infection risk  Baseline:  Goal status: INITIAL  3.  Pt will be measured for or obtain final compression garments for lymphedema maintenance  Baseline:  Goal status: INITIAL   PLAN:  PT FREQUENCY: 2-3x per week   PT DURATION: 6 weeks  PLANNED INTERVENTIONS: [id number]-Therapeutic exercises, 97530- Therapeutic activity, [id number]- Neuromuscular re-education, 97535-  Self Care, [id number]- Manual therapy, [phone]- Orthotic Initial, [id number]- Prosthetic Initial , [phone]- Orthotic/Prosthetic subsequent, Patient/Family education, Manual lymph drainage, Therapeutic exercises, Therapeutic activity, Neuromuscular re-education, Gait training, and Self Care  PLAN FOR NEXT SESSION: Cont CDT of Rt UE, cont to review self MLD while performing and assess her bandage when she returns wrapped in bandage of her doing; pt scheduled to be measured for new compression garments 10/29 @ 1:30/PT treatment 2:00 that day.  Randall Pack, SPT 02/24/24 11:05 AM  I agree with the following treatment note after reviewing documentation. This session was performed under the supervision of a licensed clinician.  Saddie Raw, PT 02/24/24, 12:13 PM

## 2024-02-29 ENCOUNTER — Other Ambulatory Visit: Payer: Self-pay | Admitting: General Surgery

## 2024-02-29 ENCOUNTER — Ambulatory Visit
Admission: RE | Admit: 2024-02-29 | Discharge: 2024-02-29 | Disposition: A | Discharge status: Home / Self Care | Source: Ambulatory Visit | Attending: General Surgery | Admitting: General Surgery

## 2024-02-29 ENCOUNTER — Ambulatory Visit

## 2024-02-29 DIAGNOSIS — C50411 Malignant neoplasm of upper-outer quadrant of right female breast: Secondary | ICD-10-CM

## 2024-02-29 DIAGNOSIS — Z483 Aftercare following surgery for neoplasm: Secondary | ICD-10-CM

## 2024-02-29 DIAGNOSIS — I890 Lymphedema, not elsewhere classified: Secondary | ICD-10-CM

## 2024-02-29 DIAGNOSIS — R293 Abnormal posture: Secondary | ICD-10-CM

## 2024-02-29 DIAGNOSIS — R921 Mammographic calcification found on diagnostic imaging of breast: Secondary | ICD-10-CM

## 2024-02-29 NOTE — Therapy (Signed)
 OUTPATIENT PHYSICAL THERAPY  UPPER EXTREMITY ONCOLOGY TREATMENT   Patient Name: Brittany [name] MRN: [id number] DOB:1955-12-20, 68 y.o., female Today's Date: 02/29/2024  END OF SESSION:  PT End of Session - 02/29/24 0800     Visit Number 13    Number of Visits 19    Date for Recertification  03/09/24    Authorization Type not needed    PT Start Time 0801    PT Stop Time 0854    PT Time Calculation (min) 53 min    Activity Tolerance Patient tolerated treatment well    Behavior During Therapy [facility] for tasks assessed/performed          Past Medical History:  Diagnosis Date   Anxiety and depression    Elevated LDL cholesterol level    no meds   Elevated liver enzymes 07/23/2021   GERD (gastroesophageal reflux disease)    Malignant neoplasm of breast (female) (HCC)    Pre-diabetes    no meds, does not check blood sugar   Thyroid  disorder 11/14/2017   Past Surgical History:  Procedure Laterality Date   AXILLARY LYMPH NODE DISSECTION Right 05/18/2023   Procedure: RIGHT AXILLARY LYMPH NODE DISSECTION;  Surgeon: Aron Shoulders, MD;  Location: [facility];  Service: General;  Laterality: Right;  PEC BLOCK   BREAST BIOPSY Right 03/09/2023   US  RT BREAST BX W LOC DEV 1ST LESION IMG BX SPEC US  GUIDE 03/09/2023 [facility] MAMMOGRAPHY   BREAST BIOPSY  04/12/2023   US  RT RADIOACTIVE SEED LOC 04/12/2023 [facility] ULTRASOUND   BREAST LUMPECTOMY WITH RADIOACTIVE SEED AND SENTINEL LYMPH NODE BIOPSY Right 04/13/2023   Procedure: RIGHT BREAST SEED LOCALIZED LUMPECTOMY WITH SENTINEL NODE BIOPSY;  Surgeon: Aron Shoulders, MD;  Location: [facility];  Service: General;  Laterality: Right;   COLONOSCOPY  06/08/2017   HERNIA REPAIR     LAPAROSCOPIC HYSTERECTOMY  03/2017   Partial, pt still has both adnexa   PORT-A-CATH REMOVAL N/A 02/08/2024   Procedure: REMOVAL PORT-A-CATH;  Surgeon: Aron Shoulders, MD;  Location: [facility] OR;  Service: General;  Laterality: N/A;   PORTACATH PLACEMENT N/A  05/18/2023   Procedure: PORT PLACEMENT WITH ULTRASOUND GUIDANCE;  Surgeon: Aron Shoulders, MD;  Location: [facility] OR;  Service: General;  Laterality: N/A;   Patient Active Problem List   Diagnosis Date Noted   Fatigue 01/26/2024   Osteoporosis screening 01/26/2024   Dysuria 01/26/2024   Port-A-Cath in place 05/31/2023   Genetic testing 03/28/2023   Family history of breast cancer 03/16/2023   Malignant neoplasm of upper-outer quadrant of right breast in female, estrogen receptor positive (HCC) 03/14/2023   Arthralgia of right knee 01/28/2023   Pneumococcal vaccination administered at current visit 01/28/2023   Decreased GFR 12/25/2021   Encounter for general adult medical examination with abnormal findings 07/23/2021   Overweight (BMI 25.0-29.9) 07/23/2021   Hyperlipidemia 07/23/2021   Prediabetes 07/23/2021   Ceruminosis, bilateral 02/18/2021   Vitamin D  deficiency 04/01/2020   Elevated LDL cholesterol level    Anxiety and depression 11/14/2017   Abnormal mammogram with microcalcification 11/14/2017   Thyroid  disorder 11/14/2017    PCP: Lauraine Pereyra, NP  REFERRING PROVIDER: Shoulders Aron, MD  REFERRING DIAG: Diagnosis C50.411 (ICD-10-CM) - Malignant neoplasm of upper-outer quadrant of right female breast Z17.0 (ICD-10-CM) - Estrogen receptor positive  THERAPY DIAG:  Malignant neoplasm of upper-outer quadrant of right breast in female, estrogen receptor positive (HCC)  Aftercare following surgery for neoplasm  Abnormal posture  Lymphedema, not elsewhere classified  ONSET DATE:  02/22/23  Rationale for Evaluation and Treatment: Rehabilitation  SUBJECTIVE:                                                                                                                                                                                           SUBJECTIVE STATEMENT:    I and took the bandage off Saturday when I was out of town. I was in [place] this weekend.   EVAL I just  finished radiation.  I think the arm is swelling .  I think the lower arm is the most swollen.  Does not have a glove or hand piece.  The axilla is not feeling swollen.  The breast isn't feeling swollen.    PERTINENT HISTORY: Patient was diagnosed on 02/22/2023 with right grade 2 invasive ductal carcinoma breast cancer. It measures 1.3 cm and is located in the upper outer quadrant. It is ER/PR positive and HER2 negative with a Ki67 of 5% . She is now s/p Right lumpectomy with SLNB and 4+/5 LN's on 04/13/2023. She  had an ALND on 05/17/2022 with 5+/13 LN'. Completed chemotherapy, and radiation.  PAIN:  Are you having pain?  PAIN:  Are you having pain? No  PRECAUTIONS: lymphedema risk   RED FLAGS: None   WEIGHT BEARING RESTRICTIONS: No  FALLS:  Has patient fallen in last 6 months? No  LIVING ENVIRONMENT: Lives with: lives alone Lives in: House/apartment  OCCUPATION: retired  LEISURE: walking, yoga  HAND DOMINANCE: right   PRIOR LEVEL OF FUNCTION: Independent  PATIENT GOALS: decrease the swelling    OBJECTIVE: Note: Objective measures were completed at Evaluation unless otherwise noted.  COGNITION: Overall cognitive status: Within functional limits for tasks assessed   PALPATION: +1 pitting to the forearm but already very firm   OBSERVATIONS / OTHER ASSESSMENTS: Rt arm visibly larger than the Lt   UPPER EXTREMITY AROM/PROM: WNL - reports no issues  LYMPHEDEMA ASSESSMENTS:  LYMPHEDEMA ASSESSMENTS (in cm):    LANDMARK RIGHT   eval RIGHT 01/27/24 02/01/24 02/10/24 02/15/24 02/20/24  15 cm proximal to olecranon process   32.4 29.6  29.6 29.3 31.6  10 cm proximal to olecranon process 30.3 29.7 31.8 29.1 29.2 28.3 29.7  Olecranon process 24.5 24.6 27.2 25.2 25.1 24.2 25.1  15 cm proximal to ulnar styloid process   27.9 26.2 26.6 25.8 26.5  10 cm proximal to ulnar styloid process 23.2 23.4 24.1 24.4 24.8 24.3 24.3  Just proximal to ulnar styloid process 15.7 15.7 17.7 16.7 16.7  16 16.2  Across hand at thumb web space 19.2 20.3 21.3 19.5 19.3 18.8 20.8  At base of 2nd digit  6.4 6.4 7.0 6.6 6.4 6.2 6.5  (Blank rows = not tested)   LANDMARK LEFT   eval 01/27/24  15 cm proximal to olecranon process  32.1  10 cm proximal to olecranon process  31.7  Olecranon process  26.1  15 cm proximal to ulna styloid process  24.8  10 cm proximal to ulnar styloid process  21.7  Just proximal to ulnar styloid process  16.6  Across hand at thumb web space  20.2  At base of 2nd digit  7.0  (Blank rows = not tested)  Rt volume: Lt volume:  Difference of  L-DEX LYMPHEDEMA SCREENING: L-DEX LYMPHEDEMA SCREENING Measurement Type: Unilateral L-DEX MEASUREMENT EXTREMITY: Upper Extremity POSITION : Standing DOMINANT SIDE: Right At Risk Side: Right BASELINE SCORE (UNILATERAL): 2.1 L-DEX SCORE (UNILATERAL): 31.3 VALUE CHANGE (UNILAT): 29.2                                                                                                                             TREATMENT DATE:   02/29/2024 Discussed night garments and showed her tribute and Profile. Thinks she would like to have one. Manual Therapy  MLD to Rt UE in supine: Short neck, superficial and deep abdominals, Lt axillary and pectoral nodes and establishment of anterior interaxillary pathway, Rt inguinal nodes and establishment of Rt axilloinguinal pathway, then Rt UE working proximal to distal, moving inner upper arm outwards and upwards, and doing both sides of forearms, spending extra time in any areas of fibrosis then retracing all steps.  Compression Bandaging to Rt UE: Cocoa butter to arm, tg soft to the arm, Finger wrap to fingers 1-4 starting closer to PIP, artiflex from hand to axilla, 6cm hand bandage, and then 10cm wrist to axilla with X at elbow for first, and 2nd 10 cm wrist to axilla with TG soft pulled over.  02/24/24: Manual Therapy  MLD to Rt UE in supine: Short neck, superficial and  deep abdominals, Lt axillary and pectoral nodes and establishment of anterior interaxillary pathway, Rt inguinal nodes and establishment of Rt axilloinguinal pathway, then Rt UE working proximal to distal, moving inner upper arm outwards and upwards, and doing both sides of forearms, spending extra time in any areas of fibrosis then retracing all steps.  Compression Bandaging to Rt UE: Cocoa butter to arm, tg soft to the arm, Finger wrap to fingers 1-4 starting closer to PIP, artiflex from hand to axilla, 6cm hand bandage, and then 10cm wrist to axilla with X at elbow for first, and 2nd 10 cm wrist to axilla with TG soft pulled over.  02/22/24: Manual Therapy  MLD to Rt UE in supine: Short neck, superficial and deep abdominals, Lt axillary and pectoral nodes and establishment of anterior interaxillary pathway, Rt inguinal nodes and establishment of Rt axilloinguinal pathway, then Rt UE working proximal to distal, moving inner upper arm outwards and upwards, and doing both sides of forearms, spending extra time in any  areas of fibrosis then retracing all steps.  Compression Bandaging to Rt UE: Cocoa butter to arm, tg soft to the arm, Finger wrap to fingers 1-4 starting closer to PIP, artiflex from hand to axilla, 6cm hand bandage, and then 10cm wrist to axilla with X at elbow for first, and 2nd 10 cm wrist to axilla with TG soft pulled over.  02/20/24: Manual Therapy  MLD to Rt UE in supine: Short neck, superficial and deep abdominals, Lt axillary nodes and establishment of anterior interaxillary pathway, Rt inguinal nodes and establishment of Rt axilloinguinal pathway, then Rt UE working proximal to distal, moving inner upper arm outwards and upwards, and doing both sides of forearms, spending extra time in any areas of fibrosis then retracing all steps.  Compression Bandaging to Rt UE: Cocoa butter to arm, tg soft to the arm, Finger wrap to fingers 1-4 starting closer to PIP, artiflex from hand to  axilla, 6cm hand bandage, and then 10cm wrist to axilla with X at elbow for first, and 2nd 10 cm wrist to axilla with TG soft pulled over.  02/17/24: Manual Therapy  MLD (done by Randall Pack, SPT with supervision throughout by PTA) to Rt UE in supine: Short neck, superficial and deep abdominals, Lt axillary nodes and establishment of anterior interaxillary pathway, Rt inguinal nodes and establishment of Rt axilloinguinal pathway, then Rt UE working proximal to distal, moving inner upper arm outwards and upwards, and doing both sides of forearms, spending extra time in any areas of fibrosis then retracing all steps.  Compression Bandaging to Rt UE: Cocoa butter to arm, tg soft to the arm, Finger wrap to fingers 1-4 starting closer to PIP, artiflex from hand to axilla, 6cm hand bandage, and then 10cm wrist to axilla with X at elbow for first, and 2nd 10 cm wrist to axilla with TG soft pulled over.  02/15/24: Manual Therapy  MLD to Rt UE in supine: Short neck, superficial and deep abdominals, Lt axillary and pectoral nodes and establishment of anterior interaxillary pathway, Rt inguinal nodes and establishment of Rt axilloinguinal pathway, then Rt UE working proximal to distal, moving inner upper arm outwards and upwards, and doing both sides of forearms, spending extra time in any areas of fibrosis then retracing all steps.  Compression Bandaging to Rt UE: Cocoa butter to arm, tg soft to the arm, Finger wrap to fingers 1-4 starting closer to PIP, artiflex from hand to axilla, 6cm hand bandage, and then 10cm wrist to axilla with X at elbow for first, and 2nd 10 cm wrist to axilla with TG soft pulled over.    PATIENT EDUCATION:  Education details: Compression bandaging Person educated: Patient Education method: Chief Technology Officer Education comprehension: verbalized understanding  HOME EXERCISE PROGRAM:   ASSESSMENT:  CLINICAL IMPRESSION:  Pt liked the idea of the nigh garment and would  like to order one next week when she is measured.  OBJECTIVE IMPAIRMENTS: decreased knowledge of condition, decreased knowledge of use of DME, and increased edema.   ACTIVITY LIMITATIONS: none  PARTICIPATION LIMITATIONS: none  PERSONAL FACTORS: 1-2 comorbidities: ALND and radiation are also affecting patient's functional outcome.   REHAB POTENTIAL: Excellent  CLINICAL DECISION MAKING: Stable/uncomplicated  EVALUATION COMPLEXITY: Low  GOALS: Goals reviewed with patient? Yes  SHORT TERM GOALS: Target date: 02/22/24  Pt will be ind with self bandaging of the Rt arm to improve independence with lymphedema care Baseline: Goal status: INITIAL   LONG TERM GOALS: Target date: 03/09/24  Pt will be  ind with self MLD for lymphedema maintenance or pursue compression pump Baseline:  Goal status: INITIAL  2.  Pt will decrease volume in the limb by at least 50ml to decrease infection risk  Baseline:  Goal status: INITIAL  3.  Pt will be measured for or obtain final compression garments for lymphedema maintenance  Baseline:  Goal status: INITIAL   PLAN:  PT FREQUENCY: 2-3x per week   PT DURATION: 6 weeks  PLANNED INTERVENTIONS: 97110-Therapeutic exercises, 97530- Therapeutic activity, 97112- Neuromuscular re-education, 97535- Self Care, [id number]- Manual therapy, [phone]- Orthotic Initial, [id number]- Prosthetic Initial , [phone]- Orthotic/Prosthetic subsequent, Patient/Family education, Manual lymph drainage, Therapeutic exercises, Therapeutic activity, Neuromuscular re-education, Gait training, and Self Care  PLAN FOR NEXT SESSION: Cont CDT of Rt UE, cont to review self MLD while performing and assess her bandage when she returns wrapped in bandage of her doing; pt scheduled to be measured for new compression garments 10/29 @ 1:30/PT treatment 2:00 that day.  Grayce Sheldon, PT 02/29/24 8:56 AM

## 2024-03-01 ENCOUNTER — Ambulatory Visit
Admission: RE | Admit: 2024-03-01 | Discharge: 2024-03-01 | Disposition: A | Discharge status: Home / Self Care | Source: Ambulatory Visit | Attending: General Surgery | Admitting: General Surgery

## 2024-03-01 ENCOUNTER — Encounter

## 2024-03-01 DIAGNOSIS — R921 Mammographic calcification found on diagnostic imaging of breast: Secondary | ICD-10-CM

## 2024-03-01 DIAGNOSIS — Z170 Estrogen receptor positive status [ER+]: Secondary | ICD-10-CM

## 2024-03-01 HISTORY — PX: BREAST BIOPSY: SHX20

## 2024-03-02 ENCOUNTER — Telehealth: Admitting: Hematology and Oncology

## 2024-03-02 ENCOUNTER — Ambulatory Visit

## 2024-03-02 DIAGNOSIS — Z483 Aftercare following surgery for neoplasm: Secondary | ICD-10-CM

## 2024-03-02 DIAGNOSIS — C50411 Malignant neoplasm of upper-outer quadrant of right female breast: Secondary | ICD-10-CM

## 2024-03-02 DIAGNOSIS — R293 Abnormal posture: Secondary | ICD-10-CM

## 2024-03-02 DIAGNOSIS — I890 Lymphedema, not elsewhere classified: Secondary | ICD-10-CM

## 2024-03-02 NOTE — Therapy (Addendum)
 OUTPATIENT PHYSICAL THERAPY  UPPER EXTREMITY ONCOLOGY TREATMENT   Patient Name: Brittany [name] MRN: [id number] DOB:1956/03/18, 68 y.o., female Today's Date: 03/02/2024  END OF SESSION:  PT End of Session - 03/02/24 0811     Visit Number 14    Number of Visits 19    Date for Recertification  03/09/24    Authorization Type not needed    PT Start Time 0804    PT Stop Time 0859    PT Time Calculation (min) 55 min    Activity Tolerance Patient tolerated treatment well    Behavior During Therapy [facility] for tasks assessed/performed          Past Medical History:  Diagnosis Date   Anxiety and depression    Elevated LDL cholesterol level    no meds   Elevated liver enzymes 07/23/2021   GERD (gastroesophageal reflux disease)    Malignant neoplasm of breast (female) (HCC)    Pre-diabetes    no meds, does not check blood sugar   Thyroid  disorder 11/14/2017   Past Surgical History:  Procedure Laterality Date   AXILLARY LYMPH NODE DISSECTION Right 05/18/2023   Procedure: RIGHT AXILLARY LYMPH NODE DISSECTION;  Surgeon: Aron Shoulders, MD;  Location: [facility] OR;  Service: General;  Laterality: Right;  PEC BLOCK   BREAST BIOPSY Right 03/09/2023   US  RT BREAST BX W LOC DEV 1ST LESION IMG BX SPEC US  GUIDE 03/09/2023 [facility] MAMMOGRAPHY   BREAST BIOPSY  04/12/2023   US  RT RADIOACTIVE SEED LOC 04/12/2023 [facility] ULTRASOUND   BREAST BIOPSY Right 03/01/2024   MM RT BREAST BX W LOC DEV 1ST LESION IMAGE BX SPEC STEREO GUIDE 03/01/2024 [facility] MAMMOGRAPHY   BREAST LUMPECTOMY Right 04/2023   BREAST LUMPECTOMY WITH RADIOACTIVE SEED AND SENTINEL LYMPH NODE BIOPSY Right 04/13/2023   Procedure: RIGHT BREAST SEED LOCALIZED LUMPECTOMY WITH SENTINEL NODE BIOPSY;  Surgeon: Aron Shoulders, MD;  Location: [facility];  Service: General;  Laterality: Right;   COLONOSCOPY  06/08/2017   HERNIA REPAIR     LAPAROSCOPIC HYSTERECTOMY  03/2017   Partial, pt still has both adnexa   PORT-A-CATH REMOVAL N/A  02/08/2024   Procedure: REMOVAL PORT-A-CATH;  Surgeon: Aron Shoulders, MD;  Location: [facility] OR;  Service: General;  Laterality: N/A;   PORTACATH PLACEMENT N/A 05/18/2023   Procedure: PORT PLACEMENT WITH ULTRASOUND GUIDANCE;  Surgeon: Aron Shoulders, MD;  Location: [facility] OR;  Service: General;  Laterality: N/A;   Patient Active Problem List   Diagnosis Date Noted   Fatigue 01/26/2024   Osteoporosis screening 01/26/2024   Dysuria 01/26/2024   Port-A-Cath in place 05/31/2023   Genetic testing 03/28/2023   Family history of breast cancer 03/16/2023   Malignant neoplasm of upper-outer quadrant of right breast in female, estrogen receptor positive (HCC) 03/14/2023   Arthralgia of right knee 01/28/2023   Pneumococcal vaccination administered at current visit 01/28/2023   Decreased GFR 12/25/2021   Encounter for general adult medical examination with abnormal findings 07/23/2021   Overweight (BMI 25.0-29.9) 07/23/2021   Hyperlipidemia 07/23/2021   Prediabetes 07/23/2021   Ceruminosis, bilateral 02/18/2021   Vitamin D  deficiency 04/01/2020   Elevated LDL cholesterol level    Anxiety and depression 11/14/2017   Abnormal mammogram with microcalcification 11/14/2017   Thyroid  disorder 11/14/2017    PCP: Lauraine Pereyra, NP  REFERRING PROVIDER: Shoulders Aron, MD  REFERRING DIAG: Diagnosis C50.411 (ICD-10-CM) - Malignant neoplasm of upper-outer quadrant of right female breast Z17.0 (ICD-10-CM) - Estrogen receptor positive  THERAPY DIAG:  Malignant neoplasm of upper-outer quadrant of right breast in female, estrogen receptor positive [facility])  Aftercare following surgery for neoplasm  Abnormal posture  Lymphedema, not elsewhere classified  ONSET DATE: 02/22/23  Rationale for Evaluation and Treatment: Rehabilitation  SUBJECTIVE:                                                                                                                                                                                            SUBJECTIVE STATEMENT:  I had my mammogram Wed and they wanted me to get a biopsy of a nearby spot near where my cancer was. So I had the biopsy yesterday. You'll just have to be careful when you go across my chest.    EVAL I just finished radiation.  I think the arm is swelling .  I think the lower arm is the most swollen.  Does not have a glove or hand piece.  The axilla is not feeling swollen.  The breast isn't feeling swollen.    PERTINENT HISTORY: Patient was diagnosed on 02/22/2023 with right grade 2 invasive ductal carcinoma breast cancer. It measures 1.3 cm and is located in the upper outer quadrant. It is ER/PR positive and HER2 negative with a Ki67 of 5% . She is now s/p Right lumpectomy with SLNB and 4+/5 LN's on 04/13/2023. She  had an ALND on 05/17/2022 with 5+/13 LN'. Completed chemotherapy, and radiation.  PAIN:  Are you having pain?  PAIN:  Are you having pain? No  PRECAUTIONS: lymphedema risk   RED FLAGS: None   WEIGHT BEARING RESTRICTIONS: No  FALLS:  Has patient fallen in last 6 months? No  LIVING ENVIRONMENT: Lives with: lives alone Lives in: House/apartment  OCCUPATION: retired  LEISURE: walking, yoga  HAND DOMINANCE: right   PRIOR LEVEL OF FUNCTION: Independent  PATIENT GOALS: decrease the swelling    OBJECTIVE: Note: Objective measures were completed at Evaluation unless otherwise noted.  COGNITION: Overall cognitive status: Within functional limits for tasks assessed   PALPATION: +1 pitting to the forearm but already very firm   OBSERVATIONS / OTHER ASSESSMENTS: Rt arm visibly larger than the Lt   UPPER EXTREMITY AROM/PROM: WNL - reports no issues  LYMPHEDEMA ASSESSMENTS:  LYMPHEDEMA ASSESSMENTS (in cm):    [facility] RIGHT   eval RIGHT 01/27/24 02/01/24 02/10/24 02/15/24 02/20/24 03/02/24  15 cm proximal to olecranon process   32.4 29.6  29.6 29.3 31.6 28.9  10 cm proximal to olecranon process 30.3 29.7 31.8 29.1 29.2 28.3 29.7  28.7  Olecranon process 24.5 24.6 27.2 25.2 25.1 24.2 25.1 24.7  15 cm proximal to ulnar styloid process  27.9 26.2 26.6 25.8 26.5 26.3  10 cm proximal to ulnar styloid process 23.2 23.4 24.1 24.4 24.8 24.3 24.3 24.7  Just proximal to ulnar styloid process 15.7 15.7 17.7 16.7 16.7 16 16.2 16.4  Across hand at thumb web space 19.2 20.3 21.3 19.5 19.3 18.8 20.8 19.1  At base of 2nd digit 6.4 6.4 7.0 6.6 6.4 6.2 6.5 6.3  (Blank rows = not tested)   LANDMARK LEFT   eval 01/27/24  15 cm proximal to olecranon process  32.1  10 cm proximal to olecranon process  31.7  Olecranon process  26.1  15 cm proximal to ulna styloid process  24.8  10 cm proximal to ulnar styloid process  21.7  Just proximal to ulnar styloid process  16.6  Across hand at thumb web space  20.2  At base of 2nd digit  7.0  (Blank rows = not tested)  Rt volume: Lt volume:  Difference of  L-DEX LYMPHEDEMA SCREENING: L-DEX LYMPHEDEMA SCREENING Measurement Type: Unilateral L-DEX MEASUREMENT EXTREMITY: Upper Extremity POSITION : Standing DOMINANT SIDE: Right At Risk Side: Right BASELINE SCORE (UNILATERAL): 2.1 L-DEX SCORE (UNILATERAL): 31.3 VALUE CHANGE (UNILAT): 29.2                                                                                                                             TREATMENT DATE:  03/02/24: Manual Therapy  MLD to Rt UE in supine: Short neck, superficial and deep abdominals, Lt axillary and pectoral nodes and establishment of anterior interaxillary pathway avoiding new biopsy area at Rt superior chest, Rt inguinal nodes and establishment of Rt axilloinguinal pathway, then Rt UE working proximal to distal, moving inner upper arm outwards and upwards, and doing both sides of forearms, spending extra time in any areas of fibrosis then retracing all steps.  Compression Bandaging to Rt UE: Cocoa butter to arm, tg soft to the arm, Artiflex to antecubital fossa and to upper arm  since no foam here, 1/4 gray foam from wrist to elbow fixated with elastomull, Finger wrap to fingers 1-4 starting closer to PIP, artiflex to hand and wrist, 1/2 gray foam to dorsal hand/wrist fixated with 6cm hand bandage, and then 10cm wrist to axilla with X at elbow for first, and 2nd 10 cm wrist to axilla with TG soft pulled over.  02/29/2024 Discussed night garments and showed her tribute and Profile. Thinks she would like to have one. Manual Therapy  MLD to Rt UE in supine: Short neck, superficial and deep abdominals, Lt axillary and pectoral nodes and establishment of anterior interaxillary pathway, Rt inguinal nodes and establishment of Rt axilloinguinal pathway, then Rt UE working proximal to distal, moving inner upper arm outwards and upwards, and doing both sides of forearms, spending extra time in any areas of fibrosis then retracing all steps.  Compression Bandaging to Rt UE: Cocoa butter to arm, tg soft to the arm, Finger wrap to fingers 1-4 starting closer  to PIP, artiflex from hand to axilla, 6cm hand bandage, and then 10cm wrist to axilla with X at elbow for first, and 2nd 10 cm wrist to axilla with TG soft pulled over.  02/24/24: Manual Therapy  MLD to Rt UE in supine: Short neck, superficial and deep abdominals, Lt axillary and pectoral nodes and establishment of anterior interaxillary pathway, Rt inguinal nodes and establishment of Rt axilloinguinal pathway, then Rt UE working proximal to distal, moving inner upper arm outwards and upwards, and doing both sides of forearms, spending extra time in any areas of fibrosis then retracing all steps.  Compression Bandaging to Rt UE: Cocoa butter to arm, tg soft to the arm, Finger wrap to fingers 1-4 starting closer to PIP, artiflex from hand to axilla, 6cm hand bandage, and then 10cm wrist to axilla with X at elbow for first, and 2nd 10 cm wrist to axilla with TG soft pulled over.      PATIENT EDUCATION:  Education details:  Compression bandaging Person educated: Patient Education method: Chief Technology Officer Education comprehension: verbalized understanding  HOME EXERCISE PROGRAM:   ASSESSMENT:  CLINICAL IMPRESSION:  Continued with CDT to Rt UE. Avoided new biopsy area with anastomosis work. Added gray foam to forearm and hand trying to attain further reduction from forearm before garment measurements next week.   OBJECTIVE IMPAIRMENTS: decreased knowledge of condition, decreased knowledge of use of DME, and increased edema.   ACTIVITY LIMITATIONS: none  PARTICIPATION LIMITATIONS: none  PERSONAL FACTORS: 1-2 comorbidities: ALND and radiation are also affecting patient's functional outcome.   REHAB POTENTIAL: Excellent  CLINICAL DECISION MAKING: Stable/uncomplicated  EVALUATION COMPLEXITY: Low  GOALS: Goals reviewed with patient? Yes  SHORT TERM GOALS: Target date: 02/22/24  Pt will be ind with self bandaging of the Rt arm to improve independence with lymphedema care Baseline: Goal status: INITIAL   LONG TERM GOALS: Target date: 03/09/24  Pt will be ind with self MLD for lymphedema maintenance or pursue compression pump Baseline:  Goal status: INITIAL  2.  Pt will decrease volume in the limb by at least 50ml to decrease infection risk  Baseline:  Goal status: INITIAL  3.  Pt will be measured for or obtain final compression garments for lymphedema maintenance  Baseline:  Goal status: INITIAL   PLAN:  PT FREQUENCY: 2-3x per week   PT DURATION: 6 weeks  PLANNED INTERVENTIONS: [id number]-Therapeutic exercises, 97530- Therapeutic activity, [id number]- Neuromuscular re-education, 97535- Self Care, [id number]- Manual therapy, [phone]- Orthotic Initial, [id number]- Prosthetic Initial , [phone]- Orthotic/Prosthetic subsequent, Patient/Family education, Manual lymph drainage, Therapeutic exercises, Therapeutic activity, Neuromuscular re-education, Gait training, and Self Care  PLAN FOR NEXT SESSION: How  was foam? Cont CDT of Rt UE, cont to review self MLD while performing and assess her bandage when she returns wrapped in bandage of her doing; pt scheduled to be measured for new compression garments 10/29 @ 1:30/PT treatment 2:00 that day.  Berwyn Knights, PTA 03/02/24 10:54 AM

## 2024-03-05 ENCOUNTER — Ambulatory Visit: Admitting: Rehabilitation

## 2024-03-05 ENCOUNTER — Encounter: Payer: Self-pay | Admitting: Rehabilitation

## 2024-03-05 DIAGNOSIS — I890 Lymphedema, not elsewhere classified: Secondary | ICD-10-CM

## 2024-03-05 DIAGNOSIS — Z483 Aftercare following surgery for neoplasm: Secondary | ICD-10-CM

## 2024-03-05 DIAGNOSIS — R293 Abnormal posture: Secondary | ICD-10-CM

## 2024-03-05 DIAGNOSIS — C50411 Malignant neoplasm of upper-outer quadrant of right female breast: Secondary | ICD-10-CM

## 2024-03-05 NOTE — Therapy (Signed)
 OUTPATIENT PHYSICAL THERAPY  UPPER EXTREMITY ONCOLOGY TREATMENT   Patient Name: Brittany [name] MRN: [id number] DOB:10-01-1955, 68 y.o., female Today's Date: 03/05/2024  END OF SESSION:  PT End of Session - 03/05/24 0800     Visit Number 15    Number of Visits 19    Date for Recertification  03/09/24    Authorization Type not needed    PT Start Time 0800    PT Stop Time 0856    PT Time Calculation (min) 56 min    Activity Tolerance Patient tolerated treatment well    Behavior During Therapy [facility] for tasks assessed/performed          Past Medical History:  Diagnosis Date   Anxiety and depression    Elevated LDL cholesterol level    no meds   Elevated liver enzymes 07/23/2021   GERD (gastroesophageal reflux disease)    Malignant neoplasm of breast (female) (HCC)    Pre-diabetes    no meds, does not check blood sugar   Thyroid  disorder 11/14/2017   Past Surgical History:  Procedure Laterality Date   AXILLARY LYMPH NODE DISSECTION Right 05/18/2023   Procedure: RIGHT AXILLARY LYMPH NODE DISSECTION;  Surgeon: Aron Shoulders, MD;  Location: [facility] OR;  Service: General;  Laterality: Right;  PEC BLOCK   BREAST BIOPSY Right 03/09/2023   US  RT BREAST BX W LOC DEV 1ST LESION IMG BX SPEC US  GUIDE 03/09/2023 [facility] MAMMOGRAPHY   BREAST BIOPSY  04/12/2023   US  RT RADIOACTIVE SEED LOC 04/12/2023 [facility] ULTRASOUND   BREAST BIOPSY Right 03/01/2024   MM RT BREAST BX W LOC DEV 1ST LESION IMAGE BX SPEC STEREO GUIDE 03/01/2024 [facility] MAMMOGRAPHY   BREAST LUMPECTOMY Right 04/2023   BREAST LUMPECTOMY WITH RADIOACTIVE SEED AND SENTINEL LYMPH NODE BIOPSY Right 04/13/2023   Procedure: RIGHT BREAST SEED LOCALIZED LUMPECTOMY WITH SENTINEL NODE BIOPSY;  Surgeon: Aron Shoulders, MD;  Location: [facility];  Service: General;  Laterality: Right;   COLONOSCOPY  06/08/2017   HERNIA REPAIR     LAPAROSCOPIC HYSTERECTOMY  03/2017   Partial, pt still has both adnexa   PORT-A-CATH REMOVAL N/A  02/08/2024   Procedure: REMOVAL PORT-A-CATH;  Surgeon: Aron Shoulders, MD;  Location: [facility] OR;  Service: General;  Laterality: N/A;   PORTACATH PLACEMENT N/A 05/18/2023   Procedure: PORT PLACEMENT WITH ULTRASOUND GUIDANCE;  Surgeon: Aron Shoulders, MD;  Location: [facility] OR;  Service: General;  Laterality: N/A;   Patient Active Problem List   Diagnosis Date Noted   Fatigue 01/26/2024   Osteoporosis screening 01/26/2024   Dysuria 01/26/2024   Port-A-Cath in place 05/31/2023   Genetic testing 03/28/2023   Family history of breast cancer 03/16/2023   Malignant neoplasm of upper-outer quadrant of right breast in female, estrogen receptor positive (HCC) 03/14/2023   Arthralgia of right knee 01/28/2023   Pneumococcal vaccination administered at current visit 01/28/2023   Decreased GFR 12/25/2021   Encounter for general adult medical examination with abnormal findings 07/23/2021   Overweight (BMI 25.0-29.9) 07/23/2021   Hyperlipidemia 07/23/2021   Prediabetes 07/23/2021   Ceruminosis, bilateral 02/18/2021   Vitamin D  deficiency 04/01/2020   Elevated LDL cholesterol level    Anxiety and depression 11/14/2017   Abnormal mammogram with microcalcification 11/14/2017   Thyroid  disorder 11/14/2017    PCP: Lauraine Pereyra, NP  REFERRING PROVIDER: Shoulders Aron, MD  REFERRING DIAG: Diagnosis C50.411 (ICD-10-CM) - Malignant neoplasm of upper-outer quadrant of right female breast Z17.0 (ICD-10-CM) - Estrogen receptor positive  THERAPY DIAG:  Malignant neoplasm of upper-outer quadrant of right breast in female, estrogen receptor positive [facility])  Aftercare following surgery for neoplasm  Abnormal posture  Lymphedema, not elsewhere classified  ONSET DATE: 02/22/23  Rationale for Evaluation and Treatment: Rehabilitation  SUBJECTIVE:                                                                                                                                                                                            SUBJECTIVE STATEMENT:  The patient reports that the foam made a huge difference on the forearm. She would like to wrap with the foam again today before she gets measured for a compression sleeve on Wednesday. The arm was itchy so she took the bandage off on Saturday - states she prefers the lotion over the coco butter before wrapping her arm.   EVAL I just finished radiation.  I think the arm is swelling .  I think the lower arm is the most swollen.  Does not have a glove or hand piece.  The axilla is not feeling swollen.  The breast isn't feeling swollen.    PERTINENT HISTORY: Patient was diagnosed on 02/22/2023 with right grade 2 invasive ductal carcinoma breast cancer. It measures 1.3 cm and is located in the upper outer quadrant. It is ER/PR positive and HER2 negative with a Ki67 of 5% . She is now s/p Right lumpectomy with SLNB and 4+/5 LN's on 04/13/2023. She  had an ALND on 05/17/2022 with 5+/13 LN'. Completed chemotherapy, and radiation.  PAIN:  Are you having pain?  PAIN:  Are you having pain? No  PRECAUTIONS: lymphedema risk   RED FLAGS: None   WEIGHT BEARING RESTRICTIONS: No  FALLS:  Has patient fallen in last 6 months? No  LIVING ENVIRONMENT: Lives with: lives alone Lives in: House/apartment  OCCUPATION: retired  LEISURE: walking, yoga  HAND DOMINANCE: right   PRIOR LEVEL OF FUNCTION: Independent  PATIENT GOALS: decrease the swelling    OBJECTIVE: Note: Objective measures were completed at Evaluation unless otherwise noted.  COGNITION: Overall cognitive status: Within functional limits for tasks assessed   PALPATION: +1 pitting to the forearm but already very firm   OBSERVATIONS / OTHER ASSESSMENTS: Rt arm visibly larger than the Lt   UPPER EXTREMITY AROM/PROM: WNL - reports no issues  LYMPHEDEMA ASSESSMENTS:  LYMPHEDEMA ASSESSMENTS (in cm):    [facility] RIGHT   eval RIGHT 01/27/24 02/01/24 02/10/24 02/15/24 02/20/24 03/02/24  15 cm proximal to  olecranon process   32.4 29.6  29.6 29.3 31.6 28.9  10 cm proximal to olecranon process 30.3 29.7 31.8 29.1 29.2 28.3 29.7 28.7  Olecranon  process 24.5 24.6 27.2 25.2 25.1 24.2 25.1 24.7  15 cm proximal to ulnar styloid process   27.9 26.2 26.6 25.8 26.5 26.3  10 cm proximal to ulnar styloid process 23.2 23.4 24.1 24.4 24.8 24.3 24.3 24.7  Just proximal to ulnar styloid process 15.7 15.7 17.7 16.7 16.7 16 16.2 16.4  Across hand at thumb web space 19.2 20.3 21.3 19.5 19.3 18.8 20.8 19.1  At base of 2nd digit 6.4 6.4 7.0 6.6 6.4 6.2 6.5 6.3  (Blank rows = not tested)   [place] LEFT   eval 01/27/24  15 cm proximal to olecranon process  32.1  10 cm proximal to olecranon process  31.7  Olecranon process  26.1  15 cm proximal to ulna styloid process  24.8  10 cm proximal to ulnar styloid process  21.7  Just proximal to ulnar styloid process  16.6  Across hand at thumb web space  20.2  At base of 2nd digit  7.0  (Blank rows = not tested)  Rt volume: Lt volume:  Difference of  L-DEX LYMPHEDEMA SCREENING: L-DEX LYMPHEDEMA SCREENING Measurement Type: Unilateral L-DEX MEASUREMENT EXTREMITY: Upper Extremity POSITION : Standing DOMINANT SIDE: Right At Risk Side: Right BASELINE SCORE (UNILATERAL): 2.1 L-DEX SCORE (UNILATERAL): 31.3 VALUE CHANGE (UNILAT): 29.2                                                                                                                             TREATMENT DATE:  03/05/24: Manual Therapy  MLD to Rt UE in supine: Short neck, superficial and deep abdominals, Lt axillary and pectoral nodes and establishment of anterior interaxillary pathway avoiding new biopsy area at Rt superior chest, Rt inguinal nodes and establishment of Rt axilloinguinal pathway, then Rt UE working proximal to distal, moving inner upper arm outwards and upwards, and doing both sides of forearms, spending extra time in any areas of fibrosis then retracing all  steps.  Compression Bandaging to Rt UE: Cocoa butter to arm, tg soft to the arm, Artiflex to antecubital fossa and to upper arm since no foam here, 1/4 gray foam from wrist to elbow fixated with elastomull, Finger wrap to fingers 1-4 starting closer to PIP, artiflex to hand and wrist, 1/2 gray foam to dorsal hand/wrist fixated with 6cm hand bandage, and then 10cm wrist to axilla with X at elbow for first, and 2nd 10 cm wrist to axilla with TG soft pulled over.  03/02/24: Manual Therapy  MLD to Rt UE in supine: Short neck, superficial and deep abdominals, Lt axillary and pectoral nodes and establishment of anterior interaxillary pathway avoiding new biopsy area at Rt superior chest, Rt inguinal nodes and establishment of Rt axilloinguinal pathway, then Rt UE working proximal to distal, moving inner upper arm outwards and upwards, and doing both sides of forearms, spending extra time in any areas of fibrosis then retracing all steps.  Compression Bandaging to Rt UE: Cocoa butter to arm, tg  soft to the arm, Artiflex to antecubital fossa and to upper arm since no foam here, 1/4 gray foam from wrist to elbow fixated with elastomull, Finger wrap to fingers 1-4 starting closer to PIP, artiflex to hand and wrist, 1/2 gray foam to dorsal hand/wrist fixated with 6cm hand bandage, and then 10cm wrist to axilla with X at elbow for first, and 2nd 10 cm wrist to axilla with TG soft pulled over.  02/29/2024 Discussed night garments and showed her tribute and Profile. Thinks she would like to have one. Manual Therapy  MLD to Rt UE in supine: Short neck, superficial and deep abdominals, Lt axillary and pectoral nodes and establishment of anterior interaxillary pathway, Rt inguinal nodes and establishment of Rt axilloinguinal pathway, then Rt UE working proximal to distal, moving inner upper arm outwards and upwards, and doing both sides of forearms, spending extra time in any areas of fibrosis then retracing all  steps.  Compression Bandaging to Rt UE: Cocoa butter to arm, tg soft to the arm, Finger wrap to fingers 1-4 starting closer to PIP, artiflex from hand to axilla, 6cm hand bandage, and then 10cm wrist to axilla with X at elbow for first, and 2nd 10 cm wrist to axilla with TG soft pulled over.  02/24/24: Manual Therapy  MLD to Rt UE in supine: Short neck, superficial and deep abdominals, Lt axillary and pectoral nodes and establishment of anterior interaxillary pathway, Rt inguinal nodes and establishment of Rt axilloinguinal pathway, then Rt UE working proximal to distal, moving inner upper arm outwards and upwards, and doing both sides of forearms, spending extra time in any areas of fibrosis then retracing all steps.  Compression Bandaging to Rt UE: Cocoa butter to arm, tg soft to the arm, Finger wrap to fingers 1-4 starting closer to PIP, artiflex from hand to axilla, 6cm hand bandage, and then 10cm wrist to axilla with X at elbow for first, and 2nd 10 cm wrist to axilla with TG soft pulled over.    PATIENT EDUCATION:  Education details: Compression bandaging Person educated: Patient Education method: Chief Technology Officer Education comprehension: verbalized understanding  HOME EXERCISE PROGRAM:   ASSESSMENT:  CLINICAL IMPRESSION: Continued with CDT to Rt UE and avoided the biopsy spot due to the patient reporting tenderness in that region. Patient tolerated the addition of foam with compression bandaging after her previous visit and reports she noticed a difference in size with the foam. Per the patient's request, the foam is used with the compression bandaging and the patient will get measured for a compression sleeve on 03/07/24.   OBJECTIVE IMPAIRMENTS: decreased knowledge of condition, decreased knowledge of use of DME, and increased edema.   ACTIVITY LIMITATIONS: none  PARTICIPATION LIMITATIONS: none  PERSONAL FACTORS: 1-2 comorbidities: ALND and radiation are also affecting  patient's functional outcome.   REHAB POTENTIAL: Excellent  CLINICAL DECISION MAKING: Stable/uncomplicated  EVALUATION COMPLEXITY: Low  GOALS: Goals reviewed with patient? Yes  SHORT TERM GOALS: Target date: 02/22/24  Pt will be ind with self bandaging of the Rt arm to improve independence with lymphedema care Baseline: Goal status: INITIAL   LONG TERM GOALS: Target date: 03/09/24  Pt will be ind with self MLD for lymphedema maintenance or pursue compression pump Baseline:  Goal status: INITIAL  2.  Pt will decrease volume in the limb by at least 50ml to decrease infection risk  Baseline:  Goal status: INITIAL  3.  Pt will be measured for or obtain final compression garments for lymphedema  maintenance  Baseline:  Goal status: INITIAL   PLAN:  PT FREQUENCY: 2-3x per week   PT DURATION: 6 weeks  PLANNED INTERVENTIONS: [id number]-Therapeutic exercises, 97530- Therapeutic activity, [id number]- Neuromuscular re-education, 97535- Self Care, [id number]- Manual therapy, [phone]- Orthotic Initial, [phone]- Prosthetic Initial , [phone]- Orthotic/Prosthetic subsequent, Patient/Family education, Manual lymph drainage, Therapeutic exercises, Therapeutic activity, Neuromuscular re-education, Gait training, and Self Care  PLAN FOR NEXT SESSION: How was foam? Cont CDT of Rt UE, cont to review self MLD while performing and assess her bandage when she returns wrapped in bandage of her doing; pt scheduled to be measured for new compression garments 10/29 @ 1:30/PT treatment 2:00 that day.  Randall Pack, SPT 03/05/24 12:05 PM  I agree with the following treatment note after reviewing documentation. This session was performed under the supervision of a licensed clinician.  Saddie Raw, PT 03/05/24, 3:04 PM

## 2024-03-07 ENCOUNTER — Ambulatory Visit

## 2024-03-07 ENCOUNTER — Encounter: Payer: Self-pay | Admitting: Hematology and Oncology

## 2024-03-07 DIAGNOSIS — Z483 Aftercare following surgery for neoplasm: Secondary | ICD-10-CM | POA: Diagnosis not present

## 2024-03-07 DIAGNOSIS — R293 Abnormal posture: Secondary | ICD-10-CM

## 2024-03-07 DIAGNOSIS — I890 Lymphedema, not elsewhere classified: Secondary | ICD-10-CM

## 2024-03-07 DIAGNOSIS — Z170 Estrogen receptor positive status [ER+]: Secondary | ICD-10-CM

## 2024-03-07 NOTE — Therapy (Signed)
 OUTPATIENT PHYSICAL THERAPY  UPPER EXTREMITY ONCOLOGY TREATMENT   Patient Name: Brittany [name] MRN: [id number] DOB:[birth year], 68 y.o., female Today's Date: 03/07/2024  END OF SESSION:  PT End of Session - 03/07/24 1403     Visit Number 16    Number of Visits 19    Date for Recertification  03/09/24    Authorization Type not needed    PT Start Time 1401    PT Stop Time 1458    PT Time Calculation (min) 57 min    Activity Tolerance Patient tolerated treatment well    Behavior During Therapy [facility] for tasks assessed/performed          Past Medical History:  Diagnosis Date   Anxiety and depression    Elevated LDL cholesterol level    no meds   Elevated liver enzymes 07/23/2021   GERD (gastroesophageal reflux disease)    Malignant neoplasm of breast (female) (HCC)    Pre-diabetes    no meds, does not check blood sugar   Thyroid  disorder 11/14/2017   Past Surgical History:  Procedure Laterality Date   AXILLARY LYMPH NODE DISSECTION Right 05/18/2023   Procedure: RIGHT AXILLARY LYMPH NODE DISSECTION;  Surgeon: Aron Shoulders, MD;  Location: [facility] OR;  Service: General;  Laterality: Right;  PEC BLOCK   BREAST BIOPSY Right 03/09/2023   US  RT BREAST BX W LOC DEV 1ST LESION IMG BX SPEC US  GUIDE 03/09/2023 [facility] MAMMOGRAPHY   BREAST BIOPSY  04/12/2023   US  RT RADIOACTIVE SEED LOC 04/12/2023 [facility] ULTRASOUND   BREAST BIOPSY Right 03/01/2024   MM RT BREAST BX W LOC DEV 1ST LESION IMAGE BX SPEC STEREO GUIDE 03/01/2024 [facility] MAMMOGRAPHY   BREAST LUMPECTOMY Right 04/2023   BREAST LUMPECTOMY WITH RADIOACTIVE SEED AND SENTINEL LYMPH NODE BIOPSY Right 04/13/2023   Procedure: RIGHT BREAST SEED LOCALIZED LUMPECTOMY WITH SENTINEL NODE BIOPSY;  Surgeon: Aron Shoulders, MD;  Location: [facility];  Service: General;  Laterality: Right;   COLONOSCOPY  06/08/2017   HERNIA REPAIR     LAPAROSCOPIC HYSTERECTOMY  03/2017   Partial, pt still has both adnexa   PORT-A-CATH REMOVAL N/A  02/08/2024   Procedure: REMOVAL PORT-A-CATH;  Surgeon: Aron Shoulders, MD;  Location: [facility] OR;  Service: General;  Laterality: N/A;   PORTACATH PLACEMENT N/A 05/18/2023   Procedure: PORT PLACEMENT WITH ULTRASOUND GUIDANCE;  Surgeon: Aron Shoulders, MD;  Location: [facility] OR;  Service: General;  Laterality: N/A;   Patient Active Problem List   Diagnosis Date Noted   Fatigue 01/26/2024   Osteoporosis screening 01/26/2024   Dysuria 01/26/2024   Port-A-Cath in place 05/31/2023   Genetic testing 03/28/2023   Family history of breast cancer 03/16/2023   Malignant neoplasm of upper-outer quadrant of right breast in female, estrogen receptor positive (HCC) 03/14/2023   Arthralgia of right knee 01/28/2023   Pneumococcal vaccination administered at current visit 01/28/2023   Decreased GFR 12/25/2021   Encounter for general adult medical examination with abnormal findings 07/23/2021   Overweight (BMI 25.0-29.9) 07/23/2021   Hyperlipidemia 07/23/2021   Prediabetes 07/23/2021   Ceruminosis, bilateral 02/18/2021   Vitamin D  deficiency 04/01/2020   Elevated LDL cholesterol level    Anxiety and depression 11/14/2017   Abnormal mammogram with microcalcification 11/14/2017   Thyroid  disorder 11/14/2017    PCP: Lauraine Pereyra, NP  REFERRING PROVIDER: Shoulders Aron, MD  REFERRING DIAG: Diagnosis C50.411 (ICD-10-CM) - Malignant neoplasm of upper-outer quadrant of right female breast Z17.0 (ICD-10-CM) - Estrogen receptor positive  THERAPY DIAG:  Malignant neoplasm of upper-outer quadrant of right breast in female, estrogen receptor positive (HCC)  Aftercare following surgery for neoplasm  Abnormal posture  Lymphedema, not elsewhere classified  ONSET DATE: 02/22/23  Rationale for Evaluation and Treatment: Rehabilitation  SUBJECTIVE:                                                                                                                                                                                            SUBJECTIVE STATEMENT:  My forearm feels a little puffy today but I removed the hand bandage yesterday because I was carving pumpkins. I took the rest of the bandage off this morning. Cassidy measured me for my new day and night compression garments before my session today.   EVAL I just finished radiation.  I think the arm is swelling .  I think the lower arm is the most swollen.  Does not have a glove or hand piece.  The axilla is not feeling swollen.  The breast isn't feeling swollen.    PERTINENT HISTORY: Patient was diagnosed on 02/22/2023 with right grade 2 invasive ductal carcinoma breast cancer. It measures 1.3 cm and is located in the upper outer quadrant. It is ER/PR positive and HER2 negative with a Ki67 of 5% . She is now s/p Right lumpectomy with SLNB and 4+/5 LN's on 04/13/2023. She  had an ALND on 05/17/2022 with 5+/13 LN'. Completed chemotherapy, and radiation.  PAIN:  Are you having pain?  PAIN:  Are you having pain? No  PRECAUTIONS: lymphedema risk   RED FLAGS: None   WEIGHT BEARING RESTRICTIONS: No  FALLS:  Has patient fallen in last 6 months? No  LIVING ENVIRONMENT: Lives with: lives alone Lives in: House/apartment  OCCUPATION: retired  LEISURE: walking, yoga  HAND DOMINANCE: right   PRIOR LEVEL OF FUNCTION: Independent  PATIENT GOALS: decrease the swelling    OBJECTIVE: Note: Objective measures were completed at Evaluation unless otherwise noted.  COGNITION: Overall cognitive status: Within functional limits for tasks assessed   PALPATION: +1 pitting to the forearm but already very firm   OBSERVATIONS / OTHER ASSESSMENTS: Rt arm visibly larger than the Lt   UPPER EXTREMITY AROM/PROM: WNL - reports no issues  LYMPHEDEMA ASSESSMENTS:  LYMPHEDEMA ASSESSMENTS (in cm):    [facility] RIGHT   eval RIGHT 01/27/24 02/01/24 02/10/24 02/15/24 02/20/24 03/02/24  15 cm proximal to olecranon process   32.4 29.6  29.6 29.3 31.6 28.9  10 cm proximal to  olecranon process 30.3 29.7 31.8 29.1 29.2 28.3 29.7 28.7  Olecranon process 24.5 24.6 27.2 25.2 25.1 24.2 25.1 24.7  15 cm proximal to ulnar  styloid process   27.9 26.2 26.6 25.8 26.5 26.3  10 cm proximal to ulnar styloid process 23.2 23.4 24.1 24.4 24.8 24.3 24.3 24.7  Just proximal to ulnar styloid process 15.7 15.7 17.7 16.7 16.7 16 16.2 16.4  Across hand at thumb web space 19.2 20.3 21.3 19.5 19.3 18.8 20.8 19.1  At base of 2nd digit 6.4 6.4 7.0 6.6 6.4 6.2 6.5 6.3  (Blank rows = not tested)   LANDMARK LEFT   eval 01/27/24  15 cm proximal to olecranon process  32.1  10 cm proximal to olecranon process  31.7  Olecranon process  26.1  15 cm proximal to ulna styloid process  24.8  10 cm proximal to ulnar styloid process  21.7  Just proximal to ulnar styloid process  16.6  Across hand at thumb web space  20.2  At base of 2nd digit  7.0  (Blank rows = not tested)  Rt volume: Lt volume:  Difference of  L-DEX LYMPHEDEMA SCREENING: L-DEX LYMPHEDEMA SCREENING Measurement Type: Unilateral L-DEX MEASUREMENT EXTREMITY: Upper Extremity POSITION : Standing DOMINANT SIDE: Right At Risk Side: Right BASELINE SCORE (UNILATERAL): 2.1 L-DEX SCORE (UNILATERAL): 31.3 VALUE CHANGE (UNILAT): 29.2                                                                                                                             TREATMENT DATE:  03/07/24: Manual Therapy  MLD to Rt UE in supine: Short neck, superficial and deep abdominals, Lt axillary and pectoral nodes and establishment of anterior interaxillary pathway avoiding new biopsy area at Rt superior chest, Rt inguinal nodes and establishment of Rt axilloinguinal pathway, then Rt UE working proximal to distal, moving inner upper arm outwards and upwards, and doing both sides of forearms, spending extra time in any areas of fibrosis then retracing all steps.  Compression Bandaging to Rt UE: Cocoa butter to arm, tg soft to the  arm, Artiflex to antecubital fossa, 1/4 gray foam from wrist to elbow fixated with elastomull, Finger wrap to fingers 1-4 starting closer to PIP, artiflex to hand and wrist, 1/4 doubled (pt forgot her 1/2 dorsal hand piece) gray foam to dorsal hand/wrist fixated with 6cm hand bandage, and then 10cm wrist to axilla with X at elbow for first, and 2nd 10 cm wrist to axilla with TG soft pulled over.  03/05/24: Manual Therapy  MLD to Rt UE in supine: Short neck, superficial and deep abdominals, Lt axillary and pectoral nodes and establishment of anterior interaxillary pathway avoiding new biopsy area at Rt superior chest, Rt inguinal nodes and establishment of Rt axilloinguinal pathway, then Rt UE working proximal to distal, moving inner upper arm outwards and upwards, and doing both sides of forearms, spending extra time in any areas of fibrosis then retracing all steps.  Compression Bandaging to Rt UE: Cocoa butter to arm, tg soft to the arm, Artiflex to antecubital fossa and to upper arm since no foam  here, 1/4 gray foam from wrist to elbow fixated with elastomull, Finger wrap to fingers 1-4 starting closer to PIP, artiflex to hand and wrist, 1/2 gray foam to dorsal hand/wrist fixated with 6cm hand bandage, and then 10cm wrist to axilla with X at elbow for first, and 2nd 10 cm wrist to axilla with TG soft pulled over.  03/02/24: Manual Therapy  MLD to Rt UE in supine: Short neck, superficial and deep abdominals, Lt axillary and pectoral nodes and establishment of anterior interaxillary pathway avoiding new biopsy area at Rt superior chest, Rt inguinal nodes and establishment of Rt axilloinguinal pathway, then Rt UE working proximal to distal, moving inner upper arm outwards and upwards, and doing both sides of forearms, spending extra time in any areas of fibrosis then retracing all steps.  Compression Bandaging to Rt UE: Cocoa butter to arm, tg soft to the arm, Artiflex to antecubital fossa and to  upper arm since no foam here, 1/4 gray foam from wrist to elbow fixated with elastomull, Finger wrap to fingers 1-4 starting closer to PIP, artiflex to hand and wrist, 1/2 gray foam to dorsal hand/wrist fixated with 6cm hand bandage, and then 10cm wrist to axilla with X at elbow for first, and 2nd 10 cm wrist to axilla with TG soft pulled over.  02/29/2024 Discussed night garments and showed her tribute and Profile. Thinks she would like to have one. Manual Therapy  MLD to Rt UE in supine: Short neck, superficial and deep abdominals, Lt axillary and pectoral nodes and establishment of anterior interaxillary pathway, Rt inguinal nodes and establishment of Rt axilloinguinal pathway, then Rt UE working proximal to distal, moving inner upper arm outwards and upwards, and doing both sides of forearms, spending extra time in any areas of fibrosis then retracing all steps.  Compression Bandaging to Rt UE: Cocoa butter to arm, tg soft to the arm, Finger wrap to fingers 1-4 starting closer to PIP, artiflex from hand to axilla, 6cm hand bandage, and then 10cm wrist to axilla with X at elbow for first, and 2nd 10 cm wrist to axilla with TG soft pulled over.     PATIENT EDUCATION:  Education details: Compression bandaging Person educated: Patient Education method: Chief Technology Officer Education comprehension: verbalized understanding  HOME EXERCISE PROGRAM:   ASSESSMENT:  CLINICAL IMPRESSION: Continued with foam to Rt forearm and dorsal hand. Pt repots some visible increase today palpable in forearm but she wore her compression bandage with her hand unwrapped for a day so this may have impacted lymphatic flow. She was measured for her new day and nighttime compression garments before PT session here today at the clinic. Pt will benefit from continued physical therapy at this time until her new garments arrive.   OBJECTIVE IMPAIRMENTS: decreased knowledge of condition, decreased knowledge of use of  DME, and increased edema.   ACTIVITY LIMITATIONS: none  PARTICIPATION LIMITATIONS: none  PERSONAL FACTORS: 1-2 comorbidities: ALND and radiation are also affecting patient's functional outcome.   REHAB POTENTIAL: Excellent  CLINICAL DECISION MAKING: Stable/uncomplicated  EVALUATION COMPLEXITY: Low  GOALS: Goals reviewed with patient? Yes  SHORT TERM GOALS: Target date: 02/22/24  Pt will be ind with self bandaging of the Rt arm to improve independence with lymphedema care Baseline: Goal status: INITIAL   LONG TERM GOALS: Target date: 03/09/24  Pt will be ind with self MLD for lymphedema maintenance or pursue compression pump Baseline:  Goal status: INITIAL  2.  Pt will decrease volume in the limb by  at least 50ml to decrease infection risk  Baseline:  Goal status: INITIAL  3.  Pt will be measured for or obtain final compression garments for lymphedema maintenance  Baseline:  Goal status: INITIAL   PLAN:  PT FREQUENCY: 2-3x per week   PT DURATION: 6 weeks  PLANNED INTERVENTIONS: 97110-Therapeutic exercises, 97530- Therapeutic activity, 97112- Neuromuscular re-education, 97535- Self Care, [id number]- Manual therapy, [phone]- Orthotic Initial, [id number]- Prosthetic Initial , [phone]- Orthotic/Prosthetic subsequent, Patient/Family education, Manual lymph drainage, Therapeutic exercises, Therapeutic activity, Neuromuscular re-education, Gait training, and Self Care  PLAN FOR NEXT SESSION: Cont CDT of Rt UE, cont to review self MLD while performing and assess her bandage when she returns wrapped in bandage of her doing  Pt was measured for compression garments 03/07/24.  Berwyn Knights, PTA 03/07/24 4:39 PM

## 2024-03-08 ENCOUNTER — Telehealth: Payer: Self-pay | Admitting: Pharmacist

## 2024-03-08 ENCOUNTER — Telehealth: Payer: Self-pay

## 2024-03-08 ENCOUNTER — Other Ambulatory Visit (HOSPITAL_COMMUNITY): Payer: Self-pay

## 2024-03-08 ENCOUNTER — Inpatient Hospital Stay: Attending: Hematology and Oncology | Admitting: Hematology and Oncology

## 2024-03-08 DIAGNOSIS — Z170 Estrogen receptor positive status [ER+]: Secondary | ICD-10-CM

## 2024-03-08 DIAGNOSIS — C50411 Malignant neoplasm of upper-outer quadrant of right female breast: Secondary | ICD-10-CM | POA: Diagnosis not present

## 2024-03-08 MED ORDER — ABEMACICLIB 100 MG PO TABS: 100.0000 mg | ORAL_TABLET | Freq: Two times a day (BID) | ORAL | 1 refills | Status: DC

## 2024-03-08 NOTE — Progress Notes (Unsigned)
 [facility] Cancer Follow up:    Brittany Lauraine BRAVO, NP [address] [place] [address]   DIAGNOSIS:  Cancer Staging  Malignant neoplasm of upper-outer quadrant of right breast in female, estrogen receptor positive (HCC) Staging form: Breast, AJCC 8th Edition - Pathologic stage from 05/31/2023: Stage IB (pT1c, pN2a, cM0, G2, ER+, PR+, HER2-) - Signed by Loretha Ash, MD on 05/31/2023 Stage prefix: Initial diagnosis Method of lymph node assessment: Axillary lymph node dissection Histologic grading system: 3 grade system    SUMMARY OF ONCOLOGIC HISTORY: Oncology History  Malignant neoplasm of upper-outer quadrant of right breast in female, estrogen receptor positive (HCC)  02/22/2023 Mammogram   Screening mammogram on February 22, 2023 showed possible mass in the right breast with calcifications warranting further evaluation.  No findings suspicious for malignancy in the left breast.  Diagnostic mammogram done confirmed a persistent irregular mass within the central right breast, adjacent to the mass are slightly heterogeneous calcifications.  Ultrasound showed 1.1 x 1.2 x 1.3 cm irregular hypoechoic mass at 10 o'clock position of the right breast 1 cm from the nipple, no abnormal right axillary lymph nodes are noted.   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  Prognostic showed ER 100% positive strong staining PR 95% positive strong staining Ki-67 of 5% and HER2 2+   03/14/2023 Initial Diagnosis   Malignant neoplasm of upper-outer quadrant of right breast in female, estrogen receptor positive (HCC)    Genetic Testing   [facility] CancerNext-Expanded Panel+RNA was Negative. Report date is 03/27/2023.   The CancerNext-Expanded gene panel offered by [facility] and includes sequencing, rearrangement, and RNA analysis for the  following 71 genes: AIP, ALK, APC, ATM, AXIN2, BAP1, BARD1, BMPR1A, BRCA1, BRCA2, BRIP1, CDC73, CDH1, CDK4, CDKN1B, CDKN2A, CHEK2, CTNNA1, DICER1, FH, FLCN, KIF1B, LZTR1, MAX, MEN1, MET, MLH1, MSH2, MSH3, MSH6, MUTYH, NF1, NF2, NTHL1, PALB2, PHOX2B, PMS2, POT1, PRKAR1A, PTCH1, PTEN, RAD51C, RAD51D, RB1, RET, SDHA, SDHAF2, SDHB, SDHC, SDHD, SMAD4, SMARCA4, SMARCB1, SMARCE1, STK11, SUFU, TMEM127, TP53, TSC1, TSC2, and VHL (sequencing and deletion/duplication); EGFR, EGLN1, HOXB13, KIT, MITF, PDGFRA, POLD1, and POLE (sequencing only); EPCAM and GREM1 (deletion/duplication only).    05/31/2023 Cancer Staging   Staging form: Breast, AJCC 8th Edition - Pathologic stage from 05/31/2023: Stage IB (pT1c, pN2a, cM0, G2, ER+, PR+, HER2-) - Signed by Loretha Ash, MD on 05/31/2023 Stage prefix: Initial diagnosis Method of lymph node assessment: Axillary lymph node dissection Histologic grading system: 3 grade system   06/09/2023 -  Chemotherapy   Patient is on Treatment Plan : BREAST DOSE DENSE AC q14d / PACLitaxel  q7d       CURRENT THERAPY: Radiation  INTERVAL HISTORY:  History of Present Illness  BRIANNIA [name] is a 68 year old female with breast cancer who presents for telephone follow up while on anastrozole .  She is tolerating anastrozole  well. She has some mild aches and pains. She feels ready to try verzenio  Rest of the pertinent 10 point ROS reviewed and neg.  Patient Active Problem List   Diagnosis Date Noted   Fatigue 01/26/2024   Osteoporosis screening 01/26/2024   Dysuria 01/26/2024   Port-A-Cath in place 05/31/2023   Genetic testing 03/28/2023   Family history of breast cancer 03/16/2023   Malignant neoplasm of upper-outer quadrant of right breast in female, estrogen receptor positive (HCC) 03/14/2023  Arthralgia of right knee 01/28/2023   Pneumococcal vaccination administered at current visit 01/28/2023   Decreased GFR 12/25/2021   Encounter for general adult medical  examination with abnormal findings 07/23/2021   Overweight (BMI 25.0-29.9) 07/23/2021   Hyperlipidemia 07/23/2021   Prediabetes 07/23/2021   Ceruminosis, bilateral 02/18/2021   Vitamin D  deficiency 04/01/2020   Elevated LDL cholesterol level    Anxiety and depression 11/14/2017   Abnormal mammogram with microcalcification 11/14/2017   Thyroid  disorder 11/14/2017    is allergic to wellbutrin [bupropion].  MEDICAL HISTORY: Past Medical History:  Diagnosis Date   Anxiety and depression    Elevated LDL cholesterol level    no meds   Elevated liver enzymes 07/23/2021   GERD (gastroesophageal reflux disease)    Malignant neoplasm of breast (female) (HCC)    Pre-diabetes    no meds, does not check blood sugar   Thyroid  disorder 11/14/2017    SURGICAL HISTORY: Past Surgical History:  Procedure Laterality Date   AXILLARY LYMPH NODE DISSECTION Right 05/18/2023   Procedure: RIGHT AXILLARY LYMPH NODE DISSECTION;  Surgeon: Aron Shoulders, MD;  [facility];  Service: General;  Laterality: Right;  PEC BLOCK   BREAST BIOPSY Right 03/09/2023   US  RT BREAST BX W LOC DEV 1ST LESION IMG BX SPEC US  GUIDE 03/09/2023 [facility]MO[facility]   BREAST BIOPSY  04/12/2023   US  RT RADIOACTIVE SEED LOC 04/12/2023 [facility] ULTRASOUND   BREAST BIOPSY Right 03/01/2024   MM RT BREAST BX W LOC DEV 1ST LESION IMAGE BX SPEC STEREO GUIDE 03/01/2024 [facility] MAMMOGRAPHY   BREAST LUMPECTOMY Right 04/2023   BREAST LUMPECTOMY WITH RADIOACTIVE SEED AND SENTINEL LYMPH NODE BIOPSY Right 04/13/2023   Procedure: RIGHT BREAST SEED LOCALIZED LUMPECTOMY WITH SENTINEL NODE BIOPSY;  Surgeon: Aron Shoulders, MD;  Location: [facility];  Service: General;  Laterality: Right;   COLONOSCOPY  06/08/2017   HERNIA REPAIR     LAPAROSCOPIC HYSTERECTOMY  03/2017   Partial, pt still has both adnexa   PORT-A-CATH REMOVAL N/A 02/08/2024   Procedure: REMOVAL PORT-A-CATH;  Surgeon: Aron Shoulders, MD;  [facility];  Service:  General;  Laterality: N/A;   PORTACATH PLACEMENT N/A 05/18/2023   Procedure: PORT PLACEMENT WITH ULTRASOUND GUIDANCE;  Surgeon: Aron Shoulders, MD;  [facility];  Service: General;  Laterality: N/A;    SOCIAL HISTORY: Social History   Socioeconomic History   Marital status: Divorced    Spouse name: Not on file   Number of children: 1   Years of education: Not on file   Highest education level: Master's degree (e.g., MA, MS, MEng, MEd, MSW, MBA)  Occupational History   Occupation: RETIRED  Tobacco Use   Smoking status: Former    Types: Cigarettes   Smokeless tobacco: Never   Tobacco comments:    stopped at age 38. smoked for 4-5 years 1 ppd  Vaping Use   Vaping status: Never Used  Substance and Sexual Activity   Alcohol use: Yes    Comment: WINE OCC   Drug use: Never   Sexual activity: Not Currently    Partners: Male    Birth control/protection: Surgical    Comment: hysterectomy  Other Topics Concern   Not on file  Social History Narrative   Lives alone/2025   Social Drivers of Health   Financial Resource Strain: Low Risk  (01/22/2024)   Overall Financial Resource Strain (CARDIA)    Difficulty of Paying Living Expenses: Not very hard  Food Insecurity: No Food Insecurity (  01/22/2024)   Hunger Vital Sign    Worried About Running Out of Food in the Last Year: Never true    Ran Out of Food in the Last Year: Never true  Transportation Needs: No Transportation Needs (01/22/2024)   PRAPARE - Administrator, Civil Service (Medical): No    Lack of Transportation (Non-Medical): No  Physical Activity: Insufficiently Active (01/22/2024)   Exercise Vital Sign    Days of Exercise per Week: 3 days    Minutes of Exercise per Session: 30 min  Stress: Stress Concern Present (01/22/2024)   [employer] of Occupational Health - Occupational Stress Questionnaire    Feeling of Stress: To some extent  Social Connections: Socially Isolated (01/22/2024)   Social  Connection and Isolation Panel    Frequency of Communication with Friends and Family: Three times a week    Frequency of Social Gatherings with Friends and Family: Twice a week    Attends Religious Services: Never    Database Administrator or Organizations: No    Attends Engineer, Structural: Not on file    Marital Status: Divorced  Intimate Partner Violence: Patient Unable To Answer (01/26/2024)   Humiliation, Afraid, Rape, and Kick questionnaire    Fear of Current or Ex-Partner: Patient unable to answer    Emotionally Abused: Patient unable to answer    Physically Abused: Patient unable to answer    Sexually Abused: Patient unable to answer    FAMILY HISTORY: Family History  Problem Relation Age of Onset   Breast cancer Mother 55   Diabetes Mother    Hypertension Mother    Cancer Father 83 - 77       liver?   Diabetes Brother    Cancer Paternal Uncle    Breast cancer Paternal Grandmother 56       PHYSICAL EXAMINATION     There were no vitals filed for this visit.  PE deferred in lieu of counseling  LABORATORY DATA:  CBC    Component Value Date/Time   WBC 5.1 01/26/2024 1200   RBC 4.18 01/26/2024 1200   HGB 13.5 01/26/2024 1200   HGB 12.3 11/04/2023 1007   HGB 13.4 04/01/2020 0912   HCT 40.4 01/26/2024 1200   HCT 39.0 04/01/2020 0912   PLT 285.0 01/26/2024 1200   PLT 306 11/04/2023 1007   PLT 331 04/01/2020 0912   MCV 96.5 01/26/2024 1200   MCV 93 04/01/2020 0912   MCH 33.1 11/04/2023 1007   MCHC 33.4 01/26/2024 1200   RDW 14.2 01/26/2024 1200   RDW 12.6 04/01/2020 0912   LYMPHSABS 1.6 11/04/2023 1007   LYMPHSABS 2.8 04/01/2020 0912   MONOABS 0.5 11/04/2023 1007   EOSABS 0.1 11/04/2023 1007   EOSABS 0.1 04/01/2020 0912   BASOSABS 0.0 11/04/2023 1007   BASOSABS 0.0 04/01/2020 0912    CMP     Component Value Date/Time   NA 139 01/26/2024 1200   NA 140 04/01/2020 0912   K 4.3 01/26/2024 1200   CL 105 01/26/2024 1200   CO2 28 01/26/2024  1200   GLUCOSE 86 01/26/2024 1200   BUN 12 01/26/2024 1200   BUN 10 04/01/2020 0912   CREATININE 0.86 01/26/2024 1200   CREATININE 0.77 11/04/2023 1007   CALCIUM 9.6 01/26/2024 1200   PROT 6.7 01/26/2024 1200   PROT 6.6 04/01/2020 0912   ALBUMIN 4.4 01/26/2024 1200   ALBUMIN 4.3 04/01/2020 0912   AST 26 01/26/2024 1200   AST 25  11/04/2023 1007   ALT 31 01/26/2024 1200   ALT 37 11/04/2023 1007   ALKPHOS 74 01/26/2024 1200   BILITOT 0.5 01/26/2024 1200   BILITOT 0.5 11/04/2023 1007   GFRNONAA >60 11/04/2023 1007   GFRAA 71 04/01/2020 0912     ASSESSMENT and THERAPY PLAN:    Assessment and Plan Assessment & Plan 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 She is now on antiestrogen therapy with anastrozole  She is tolerating this well and is here for a telephone follow up. Given multiple positive LN, I recommended adding verzenio for adj therapy along with anastrozole . She will start at 100 mg po daily, she understands she will need chemo teach. We discussed AE's including but not limited to fatigue, nausea, vomiting, diarrhea, gerd, transaminitis, false elevation of creatinine. She will return to clinic in 5 weeks for follow up and labs.   All questions were answered. The patient knows to call the clinic with any problems, questions or concerns. We can certainly see the patient much sooner if necessary.  Total encounter time:10 minutes*in face-to-face visit time, chart review, lab review, care coordination, order entry, and documentation of the encounter time.  I connected with  Oriya E Szczepanik on 03/09/24 by a telephone application and verified that I am speaking with the correct person using two identifiers.   I discussed the limitations of evaluation and management by telemedicine. The patient expressed understanding and agreed to proceed.  Location of pt: Home Location of provider:  office.   *Total Encounter Time as defined by the Centers for Medicare and Medicaid Services includes, in addition to the face-to-face time of a patient visit (documented in the note above) non-face-to-face time: obtaining and reviewing outside history, ordering and reviewing medications, tests or procedures, care coordination (communications with other health care professionals or caregivers) and documentation in the medical record.

## 2024-03-08 NOTE — Telephone Encounter (Signed)
 Oral Oncology Patient Advocate Encounter  Prior Authorization for Verzenio has been approved.    PA# [id number] Effective dates: 03/08/2024 through 12/32/2099  Patients co-pay is $1729.28.  I will contact patient to see if assistance for copay/medication is needed.    Charlott Hamilton,  CPhT-Adv  she/her/hers [facility]  [facility] Specialty Pharmacy Services Pharmacy Technician Patient Advocate Specialist III [facility] Phone: [phone]  Fax: [phone] [email or web address]

## 2024-03-08 NOTE — Telephone Encounter (Signed)
 Oral Oncology Patient Advocate Encounter   Received notification that prior authorization for Verzenio is required.   PA submitted on 03/08/24 Key [id number] Status is pending      Charlott Hamilton,  CPhT-Adv  she/her/hers [facility]  [facility] Specialty Pharmacy Services Pharmacy Technician Patient Advocate Specialist III [facility] Phone: [phone]  Fax: [phone] [email or web address]

## 2024-03-08 NOTE — Telephone Encounter (Signed)
 [facility]    Oncology Clinical Pharmacist Practitioner Initial Assessment  Received new prescription for abemaciclib for the treatment of breast cancer in the adjuvant setting. This is being given in combination with anastrozole . It is planned to continue until two years in the adjuvant setting per the monarchE trial data.  Labs will be assessed prior to starting per Dr. Loretha. Prescription dose and frequency assessed.   Current medication list in Epic reviewed. Significant DDIs with abemaciclib identified:No.  Evaluated chart, patient barriers to medication adherence identified: No.  Patient agreement for treatment documented in MD note on 03/08/24.  Prescription has been e-scribed to the [facility]) for benefits analysis and approval.  Oral Oncology Clinic will continue to follow for insurance authorization, copayment issues, initial counseling and start date.  Brittany [name] A. Lucila, PharmD, BCOP, CPP Hematology-Oncology Clinical Pharmacist Practitioner  03/08/2024 12:08 PM  **Disclaimer: This note was dictated with voice recognition software. Similar sounding words can inadvertently be transcribed and this note may contain transcription errors which may not have been corrected upon publication of note.**

## 2024-03-09 ENCOUNTER — Encounter: Payer: Self-pay | Admitting: Hematology and Oncology

## 2024-03-09 ENCOUNTER — Encounter: Admitting: Rehabilitation

## 2024-03-09 NOTE — Telephone Encounter (Signed)
 Oral Oncology Patient Advocate Encounter Patient request PAP application for Verzenio.  Pending Signatures from patient (emailed through [email or web address]) and Dr. (Placed in [place])  I will continue to follow up     Charlott Hamilton,  CPhT-Adv  she/her/hers [facility]  [facility] Specialty Pharmacy Services Pharmacy Technician Patient Advocate Specialist III [facility] Phone: [phone]  Fax: [phone] [email or web address]

## 2024-03-10 ENCOUNTER — Encounter: Payer: Self-pay | Admitting: Hematology and Oncology

## 2024-03-12 NOTE — Telephone Encounter (Signed)
 Oral Oncology Patient Advocate Encounter   Submitted application for assistance for Verzenio to the [employer].  [employer] phone number [phone]   Will continue to follow until final determination.    Charlott Hamilton,  CPhT-Adv  she/her/hers [facility]  [facility] Specialty Pharmacy Services Pharmacy Technician Patient Advocate Specialist III [facility] Phone: [phone]  Fax: [phone] [email or web address]

## 2024-03-14 ENCOUNTER — Other Ambulatory Visit (HOSPITAL_COMMUNITY): Payer: Self-pay

## 2024-03-14 ENCOUNTER — Ambulatory Visit (INDEPENDENT_AMBULATORY_CARE_PROVIDER_SITE_OTHER): Admitting: Nurse Practitioner

## 2024-03-14 ENCOUNTER — Encounter: Payer: Self-pay | Admitting: Hematology and Oncology

## 2024-03-14 ENCOUNTER — Encounter: Payer: Self-pay | Admitting: Nurse Practitioner

## 2024-03-14 VITALS — BP 108/68 | HR 82 | Temp 98.1°F

## 2024-03-14 DIAGNOSIS — R102 Pelvic and perineal pain unspecified side: Secondary | ICD-10-CM | POA: Diagnosis not present

## 2024-03-14 DIAGNOSIS — R1031 Right lower quadrant pain: Secondary | ICD-10-CM | POA: Diagnosis not present

## 2024-03-14 DIAGNOSIS — M545 Low back pain, unspecified: Secondary | ICD-10-CM

## 2024-03-14 NOTE — Progress Notes (Signed)
   Acute Office Visit  Subjective:    Patient ID: Brittany [name], female    DOB: Jul 28, 1955, 68 y.o.   MRN: [id number]   HPI 68 y.o. presents today for intermittent RLQ pain for a few weeks. Describes pain as sore, occurring a couple times per week, lasts about a day, lying on right side can make it worse, radiates to right lower back. Feels more swollen/bloated in that area. Denies GI, urinary or vaginal symptoms. S/P hysterectomy for benign reasons. 05/2023 ER+/PR+ right breast cancer, currently on antiestrogen therapy (anastrozole ). Negative genetic testing.  No LMP recorded. Patient has had a hysterectomy.    Review of Systems  Constitutional: Negative.   Gastrointestinal:  Negative for constipation and diarrhea.  Genitourinary:  Positive for pelvic pain. Negative for dysuria, flank pain, frequency, hematuria, vaginal discharge and vaginal pain.  Musculoskeletal:  Positive for back pain.       Objective:    Physical Exam Constitutional:      Appearance: Normal appearance.  Abdominal:     Tenderness: There is abdominal tenderness in the right lower quadrant.  Genitourinary:    General: Normal vulva.     Vagina: Normal.     Adnexa: Left adnexa normal.       Right: Tenderness present. No mass.       BP 108/68   Pulse 82   Temp 98.1 F (36.7 C) (Oral)   SpO2 96%  Wt Readings from Last 3 Encounters:  02/08/24 (P) 175 lb (79.4 kg)  01/31/24 176 lb 14.4 oz (80.2 kg)  01/26/24 173 lb 3 oz (78.6 kg)       Patient informed chaperone available to be present for breast and pelvic exam. Patient has requested no chaperone to be present. Patient has been advised what will be completed during breast and pelvic exam.   UA negative  Assessment & Plan:   Problem List Items Addressed This Visit   None Visit Diagnoses       Right lower quadrant abdominal pain    -  Primary   Relevant Orders   US  PELVIS TRANSVAGINAL NON-OB (TV ONLY)     Acute right-sided low back pain  without sciatica         Pelvic pressure in female       Relevant Orders   Urinalysis,Complete w/RFL Culture      Plan: Negative UA. Schedule ultrasound.      [name] DNP, 10:21 AM 03/14/2024

## 2024-03-16 ENCOUNTER — Ambulatory Visit: Payer: Self-pay | Admitting: Nurse Practitioner

## 2024-03-16 LAB — URINALYSIS, COMPLETE W/RFL CULTURE
Bilirubin Urine: NEGATIVE
Glucose, UA: NEGATIVE
Hgb urine dipstick: NEGATIVE
Hyaline Cast: NONE SEEN /LPF
Ketones, ur: NEGATIVE
Nitrites, Initial: NEGATIVE
Protein, ur: NEGATIVE
RBC / HPF: NONE SEEN /HPF (ref 0–2)
Specific Gravity, Urine: 1.02 (ref 1.001–1.035)
pH: 7 (ref 5.0–8.0)

## 2024-03-16 LAB — URINE CULTURE
MICRO NUMBER:: 17194309
Result:: NO GROWTH
SPECIMEN QUALITY:: ADEQUATE

## 2024-03-16 LAB — CULTURE INDICATED

## 2024-03-16 NOTE — Telephone Encounter (Signed)
 Oral Oncology Patient Advocate Encounter   Submitted application for assistance for Verzenio to the [employer].   [employer] phone number [phone]    Status: Pending Will continue to follow until final determination

## 2024-03-19 ENCOUNTER — Ambulatory Visit: Attending: General Surgery

## 2024-03-19 VITALS — Wt 176.4 lb

## 2024-03-19 DIAGNOSIS — Z483 Aftercare following surgery for neoplasm: Secondary | ICD-10-CM | POA: Insufficient documentation

## 2024-03-19 NOTE — Telephone Encounter (Signed)
 Oral Oncology Patient Advocate Encounter  Patient will come by the cancer center to sign Verzenio PAP application in person Tuesday 03-20-24 around 11am

## 2024-03-19 NOTE — Therapy (Signed)
 OUTPATIENT PHYSICAL THERAPY SOZO SCREENING NOTE   Patient Name: Brittany [name] MRN: [id number] DOB:02-May-1956, 68 y.o., female Today's Date: 03/19/2024  PCP: Elnor [name], NP REFERRING PROVIDER: Aron Shoulders, MD   PT End of Session - 03/19/24 0911     Visit Number 16   # unchanged due to screen only   PT Start Time 0907    PT Stop Time 0911    PT Time Calculation (min) 4 min    Activity Tolerance Patient tolerated treatment well    Behavior During Therapy [facility] for tasks assessed/performed          Past Medical History:  Diagnosis Date   Allergy     Wellbutrin   Anxiety and depression    Elevated LDL cholesterol level    no meds   Elevated liver enzymes 07/23/2021   GERD (gastroesophageal reflux disease)    Malignant neoplasm of breast (female) (HCC)    Neuromuscular disorder (HCC) 2014   Neuropathy   Pre-diabetes    no meds, does not check blood sugar   Thyroid  disorder 11/14/2017   Past Surgical History:  Procedure Laterality Date   ABDOMINAL HYSTERECTOMY  03/2017   AXILLARY LYMPH NODE DISSECTION Right 05/18/2023   Procedure: RIGHT AXILLARY LYMPH NODE DISSECTION;  Surgeon: Aron Shoulders, MD;  [facility];  Service: General;  Laterality: Right;  PEC BLOCK   BREAST BIOPSY Right 03/09/2023   US  RT BREAST BX W LOC DEV 1ST LESION IMG BX SPEC US  GUIDE 03/09/2023 [facility] MAMMOGRAPHY   BREAST BIOPSY  04/12/2023   US  RT RADIOACTIVE SEED LOC 04/12/2023 [facility] ULTRASOUND   BREAST BIOPSY Right 03/01/2024   MM RT BREAST BX W LOC DEV 1ST LESION IMAGE BX SPEC STEREO GUIDE 03/01/2024 [facility] MAMMOGRAPHY   BREAST LUMPECTOMY Right 04/2023   BREAST LUMPECTOMY WITH RADIOACTIVE SEED AND SENTINEL LYMPH NODE BIOPSY Right 04/13/2023   Procedure: RIGHT BREAST SEED LOCALIZED LUMPECTOMY WITH SENTINEL NODE BIOPSY;  Surgeon: Aron Shoulders, MD;  Location: [facility];  Service: General;  Laterality: Right;   COLONOSCOPY  06/08/2017   HERNIA REPAIR     LAPAROSCOPIC  HYSTERECTOMY  03/2017   Partial, pt still has both adnexa   PORT-A-CATH REMOVAL N/A 02/08/2024   Procedure: REMOVAL PORT-A-CATH;  Surgeon: Aron Shoulders, MD;  [facility];  Service: General;  Laterality: N/A;   PORTACATH PLACEMENT N/A 05/18/2023   Procedure: PORT PLACEMENT WITH ULTRASOUND GUIDANCE;  Surgeon: Aron Shoulders, MD;  [facility];  Service: General;  Laterality: N/A;   Patient Active Problem List   Diagnosis Date Noted   Fatigue 01/26/2024   Osteoporosis screening 01/26/2024   Dysuria 01/26/2024   Port-A-Cath in place 05/31/2023   Genetic testing 03/28/2023   Family history of breast cancer 03/16/2023   Malignant neoplasm of upper-outer quadrant of right breast in female, estrogen receptor positive (HCC) 03/14/2023   Arthralgia of right knee 01/28/2023   Pneumococcal vaccination administered at current visit 01/28/2023   Decreased GFR 12/25/2021   Encounter for general adult medical examination with abnormal findings 07/23/2021   Overweight (BMI 25.0-29.9) 07/23/2021   Hyperlipidemia 07/23/2021   Prediabetes 07/23/2021   Ceruminosis, bilateral 02/18/2021   Vitamin D  deficiency 04/01/2020   Elevated LDL cholesterol level    Anxiety and depression 11/14/2017   Abnormal mammogram with microcalcification 11/14/2017   Thyroid  disorder 11/14/2017    REFERRING DIAG: right breast cancer at risk for lymphedema  THERAPY DIAG:  Aftercare following surgery for neoplasm  PERTINENT HISTORY: Patient was  diagnosed on 02/22/2023 with right grade 2 invasive ductal carcinoma breast cancer. It measures 1.3 cm and is located in the upper outer quadrant. It is ER/PR positive and HER2 negative with a Ki67 of 5% . She is now s/p Right lumpectomy with SLNB and 4+/5 LN's on 04/13/2023. She  had an ALND on 05/17/2022 with 5+/13 LN'. Completed chemotherapy, and radiation.  PRECAUTIONS: right UE Lymphedema risk, None  SUBJECTIVE: Pt returns for her final SOZO screen due to now having Stage  1-2 lymphedema.   PAIN:  Are you having pain? No  SOZO SCREENING: Patient was assessed today using the SOZO machine to determine the lymphedema index score. This was compared to her baseline score. It was determined that she is within the recommended range when compared to her baseline and no further action is needed at this time. She will continue SOZO screenings. These are done every 3 months for 2 years post operatively followed by every 6 months for 2 years, and then annually.   L-DEX FLOWSHEETS - 03/19/24 0900       L-DEX LYMPHEDEMA SCREENING   Measurement Type Unilateral    L-DEX MEASUREMENT EXTREMITY Upper Extremity    POSITION  Standing    DOMINANT SIDE Right    At Risk Side Right    BASELINE SCORE (UNILATERAL) 2.1    L-DEX SCORE (UNILATERAL) 28.2    VALUE CHANGE (UNILAT) 26.1          P: D/C SOZO screens now that pt has Stage 1-2 lymphedema.   Aden Berwyn Caldron, PTA 03/19/2024, 9:13 AM

## 2024-03-19 NOTE — Telephone Encounter (Signed)
 Oral Oncology Patient Advocate Encounter   Follow up on Verzenio PAP Application   Application resent via fax  to see if  this may expedite the process   Status: Pending      Charlott Hamilton,  CPhT-Adv  she/her/hers [facility]  [facility] Specialty Pharmacy Services Pharmacy Technician Patient Advocate Specialist III [facility] Phone: [phone]  Fax: [phone] [email or web address]

## 2024-03-20 NOTE — Telephone Encounter (Signed)
 Updated PAP Application for Verzenio faxed to [employer] today

## 2024-03-21 NOTE — Telephone Encounter (Signed)
 Oral Oncology Patient Advocate Encounter   Received notification that the application for assistance for Verzenio through [employer] has been approved.     Effective dates: 03/21/24 through 05/09/25  Medication will be filled at [facility] [phone]. [facility] should be calling in 3-5 business days to schedule delivery  I have emailed the patient the pharmacy contact info     Charlott Hamilton,  CPhT-Adv  she/her/hers [facility]  [facility] Pharmacy Technician Patient Advocate Specialist III [facility] Phone: [phone]  Fax: [phone] [email or web address]

## 2024-03-23 ENCOUNTER — Telehealth: Payer: Self-pay | Admitting: Pharmacist

## 2024-03-23 NOTE — Telephone Encounter (Signed)
 [facility]        Telephone: [phone]?Fax: [phone]   Oncology Clinical Pharmacist Practitioner Initial Assessment  Brittany [name] is a 68 y.o. female with a diagnosis of breast cancer. They were contacted today via telephone visit.  I connected with Brittany [name] today by telephone and verified that I was speaking with the correct person using two patient identifiers. I discussed the limitations, risks, security and privacy concerns of performing an evaluation and management service by telemedicine and the availability of in-person appointments. The patient/caregiver expressed understanding and agreed to proceed.  Other persons participating in the visit and their role in the encounter: none   Patient's location: home  Provider's location: clinic  Indication/Regimen Abemaciclib (Verzenio) is being used appropriately for treatment of breast cancer in the adjuvant setting by Dr. Amber Stalls. The treatment goal is: Curative.     Wt Readings from Last 1 Encounters:  03/19/24 176 lb 6 oz (80 kg)    Estimated body surface area is 1.91 meters squared (pended) as calculated from the following:   Height as of 02/08/24: (P) 5' 4.5 (1.638 m).   Weight as of 03/19/24: 176 lb 6 oz (80 kg).  The dosing regimen is 100 mg by mouth every 12 hours on days 1 to 28 of a 28-day cycle. This is being given  in combination with anastrozole . It is planned to continue until two years in the adjuvant setting per the monarchE trial data. Prescription dose and frequency assessed for appropriateness.  Patient has agreed to treatment which is documented in physician note on 03/08/24. Counseled patient on administration, dosing, side effects, monitoring, drug-food interactions, safe handling, storage, and disposal.  Abemaciclib will be coming from [employer]. She will start next day after receiving.   Dose Modifications Dr. Stalls is starting at a reduced  dose of 100 mg PO BID  Access Assessment Brittany [name] will be receiving abemaciclib through [employer] Concerns: PAP Start date if known: TBD  Adherence Assessment Reviewed importance on keeping a med schedule and plan for any missed doses Barriers to adherence identified? No  Communication and Learning Assessment Primary learner: patient Barriers to learning: No barriers Preferred language: English Learning preferences: Listening   Allergies Allergies  Allergen Reactions   Wellbutrin [Bupropion] Hives    Vitals    03/19/2024    9:11 AM 03/14/2024    9:41 AM 02/08/2024    9:35 AM  Oncology Vitals  Weight 80.003 kg    Weight (lbs) 176 lbs 6 oz    BMI 29.81 kg/m2    Temp  98.1 F (36.7 C) 97.5 F (36.4 C)  Pulse Rate  82 80  BP  108/68 101/67  Resp   16  SpO2  96 % 95 %  BSA (m2) 1.91 m2       Laboratory Data    Latest Ref Rng & Units 01/26/2024   12:00 PM 11/04/2023   10:07 AM 10/28/2023   12:50 PM  CBC EXTENDED  WBC 4.0 - 10.5 K/uL 5.1  4.5  4.3   RBC 3.87 - 5.11 Mil/uL 4.18  3.72  3.65   Hemoglobin 12.0 - 15.0 g/dL 86.4  87.6  87.8   HCT 36.0 - 46.0 % 40.4  35.3  35.1   Platelets 150.0 - 400.0 K/uL 285.0  306  325   NEUT# 1.7 - 7.7 K/uL  2.4  1.9   Lymph# 0.7 - 4.0 K/uL  1.6  1.7        Latest Ref Rng & Units 01/26/2024   12:00 PM 11/04/2023   10:07 AM 10/28/2023   12:50 PM  CMP  Glucose 70 - 99 mg/dL 86  93  89   BUN 6 - 23 mg/dL 12  12  13    Creatinine 0.40 - 1.20 mg/dL 9.13  9.22  9.20   Sodium 135 - 145 mEq/L 139  139  139   Potassium 3.5 - 5.1 mEq/L 4.3  4.2  4.1   Chloride 96 - 112 mEq/L 105  106  108   CO2 19 - 32 mEq/L 28  28  26    Calcium 8.4 - 10.5 mg/dL 9.6  9.3  9.2   Total Protein 6.0 - 8.3 g/dL 6.7  6.7  6.6   Total Bilirubin 0.2 - 1.2 mg/dL 0.5  0.5  0.4   Alkaline Phos 39 - 117 U/L 74  82  84   AST 0 - 37 U/L 26  25  23    ALT 0 - 35 U/L 31  37  35    No results found for: MG No results found for:  CA2729  Contraindications Contraindications were reviewed? Yes Contraindications to therapy were identified? No   Safety Precautions The following safety precautions for the use of abemaciclib were reviewed:  Changes in kidney function: importance of drinking plenty of fluids and monitoring urine output Diarrhea: we reviewed that diarrhea is common with abemaciclib and confirmed that she does have loperamide (Imodium) at home.  We reviewed how to take this medication PRN and gave her information on abemaciclib Decreased white blood cells (WBCs) and increased risk for infection: we discussed the importance of having a thermometer and what the Centers for Disease Control and Prevention (CDC) considers a fever which is 100.69F (38C) or higher.  Gave patient 24/7 triage line to call if any fevers or symptoms Decreased hemoglobin, part of red blood cells that carry iron and oxygen Fatigue Nausea and Vomiting Hepatotoxicity: reviewed to contact clinic for RUQ pain that will not subside, yellowing of eyes/skin Decreased appetite or weight loss Abdominal pain Decreased platelet count and increased risk for bleeding Venous thromboembolism (VTE): reviewed signs of deep vein thrombosis (DVT) such as leg swelling, redness, pain, or tenderness and signs of pulmonary embolism (PE) such as shortness of breath, rapid or irregular heartbeat, cough, chest pain, or lightheadedness ILD/Pneumonitis: we reviewed potential symptoms including cough, shortness, and fatigue. Handling body fluids and waste Pregnancy, sexual activity, and contraception Avoid grapefruit products Reviewed to take the medication every 12 hours (with food sometimes can be easier on the stomach) and to take it at the same time every day. Discussed proper storage and handling of abemaciclib  Medication Reconciliation Current Outpatient Medications  Medication Sig Dispense Refill   Alpha Lipoic Acid 200 MG CAPS Take 300 mg by mouth  daily.     anastrozole  (ARIMIDEX ) 1 MG tablet Take 1 tablet (1 mg total) by mouth daily. 90 tablet 3   ascorbic acid (VITAMIN C) 500 MG tablet Take 500 mg by mouth daily.     EVENING PRIMROSE OIL PO Take 1 tablet by mouth daily.     MAGNESIUM GLYCINATE PO Take 350 mg by mouth daily. (Patient taking differently: Take 50 mg by mouth daily.)     Methylsulfonylmethane (MSM) 1000 MG TABS Take 1 tablet by mouth 3 (three) times a week.     milk thistle 175 MG tablet Take 175 mg  by mouth daily. (Patient taking differently: Take 175 mg by mouth as needed.)     OIL OF OREGANO PO Take by mouth as needed (leg cramps). (Patient taking differently: Take 45 mg by mouth as needed (leg cramps).)     PARoxetine  (PAXIL ) 10 MG tablet TAKE ONE TABLET BY MOUTH ONCE A DAY 90 tablet 0   pyridoxine (B-6) 100 MG tablet Take 100 mg by mouth daily.     vitamin B-12 (CYANOCOBALAMIN) 500 MCG tablet Take 500 mcg by mouth daily.     VITAMIN D  PO Take 1 tablet by mouth daily.     VITAMIN K PO Take by mouth. (Patient taking differently: Take by mouth as needed.)     abemaciclib (VERZENIO) 100 MG tablet Take 1 tablet (100 mg total) by mouth 2 (two) times daily. (Patient not taking: Reported on 03/23/2024) 56 tablet 1   No current facility-administered medications for this visit.   Medication reconciliation is based on the patient's most recent medication list in the electronic medical record (EMR) including herbal products and OTC medications.   The patient's medication list was reviewed today with the patient? Yes   Drug-drug interactions (DDIs) DDIs were evaluated? Yes Significant DDIs identified? No , reviewed milk thistle and alpha lipoic acid being hepatotropic. She uses milk thistle PRN  Drug-Food Interactions Drug-food interactions were evaluated? Yes Drug-food interactions identified? Grapefruit products  Follow-up Plan  Patient education handout given to patient Start abemaciclib 100 mg by mouth every 12 hours.  Start date TBD Continue anastrozole  1 mg by mouth daily Monitor for side effects Distress thermometer not done. She has been on chemotherapy in the adjuvant setting prior Manufacturer guidelines recommend labs every 2 weeks for 2 months, then monthly for 2 months, then as clinically indicated Will add labs, Dr. Loretha visit in 3 weeks since patient will likely receive abemaciclib next week Brittany [name] will be [place] from 04/18/24 - 05/07/24 but she will have labs with her visit on 05/08/24 with Morna Kendall NP. Scheduling is aware Brittany [name] can follow up with clinical pharmacy as deemed necessary by Dr. Amber Iruku going forward   Brittany [name] participated in the discussion, expressed understanding, and voiced agreement with the above plan. All questions were answered to her satisfaction. The patient was advised to contact the clinic at [phone] with any questions or concerns prior to her return visit.   I spent 60 minutes assessing the patient.  Hermilo Dutter A. [name], PharmD, BCOP, CPP  Brittany [name], RPH-CPP, 03/23/2024 9:37 AM  **Disclaimer: This note was dictated with voice recognition software. Similar sounding words can inadvertently be transcribed and this note may contain transcription errors which may not have been corrected upon publication of note.**

## 2024-03-27 ENCOUNTER — Ambulatory Visit (INDEPENDENT_AMBULATORY_CARE_PROVIDER_SITE_OTHER)

## 2024-03-27 ENCOUNTER — Ambulatory Visit: Payer: Self-pay | Admitting: Plastic Surgery

## 2024-03-27 ENCOUNTER — Ambulatory Visit (INDEPENDENT_AMBULATORY_CARE_PROVIDER_SITE_OTHER): Payer: PRIVATE HEALTH INSURANCE | Admitting: Nurse Practitioner

## 2024-03-27 VITALS — BP 104/72 | HR 107 | Ht 64.0 in | Wt 174.0 lb

## 2024-03-27 VITALS — BP 110/74 | HR 89 | Wt 174.0 lb

## 2024-03-27 DIAGNOSIS — R1031 Right lower quadrant pain: Secondary | ICD-10-CM | POA: Diagnosis not present

## 2024-03-27 DIAGNOSIS — Z719 Counseling, unspecified: Secondary | ICD-10-CM | POA: Insufficient documentation

## 2024-03-27 DIAGNOSIS — R102 Pelvic and perineal pain unspecified side: Secondary | ICD-10-CM

## 2024-03-27 NOTE — Progress Notes (Signed)
   Acute Office Visit  Subjective:    Patient ID: Brittany [name], female    DOB: 1955/05/29, 68 y.o.   MRN: [id number]   HPI 68 y.o. presents today for ultrasound. Seen 03/14/2024 with complaints of intermittent RLQ pain for a few weeks. Describes pain as sore, occurring a couple times per week, lasts about a day, lying on right side can make it worse, radiates to right lower back. Feels more swollen/bloated in that area. Denies GI, urinary or vaginal symptoms. S/P hysterectomy for benign reasons. 05/2023 ER+/PR+ right breast cancer, currently on antiestrogen therapy (anastrozole ). Negative genetic testing.  No LMP recorded. Patient has had a hysterectomy.    Review of Systems  Constitutional: Negative.   Gastrointestinal:  Negative for constipation and diarrhea.  Genitourinary:  Positive for pelvic pain.  Musculoskeletal:  Positive for back pain.       Objective:    Physical Exam Constitutional:      Appearance: Normal appearance.     BP 110/74 (BP Location: Left Arm, Patient Position: Sitting, Cuff Size: Normal)   Pulse 89   Wt 174 lb (78.9 kg)   SpO2 96%   BMI (P) 29.41 kg/m  Wt Readings from Last 3 Encounters:  03/27/24 174 lb (78.9 kg)  03/19/24 176 lb 6 oz (80 kg)  02/08/24 (P) 175 lb (79.4 kg)        Assessment & Plan:   Problem List Items Addressed This Visit   None Visit Diagnoses       Right lower quadrant abdominal pain    -  Primary     Pelvic pressure in female          Vaginal ultrasound: Vaginal cuff within normal limits.  Cervix and uterus are surgically absent.  Both ovaries atrophic in size with positive perfusion.  No adnexal masses, no free fluid.  Plan: Normal ultrasound reviewed with patient.  Will follow-up with PCP.  Return if symptoms worsen or fail to improve.    Brittany DELENA Shutter DNP, 9:57 AM 03/27/2024

## 2024-03-27 NOTE — Progress Notes (Signed)
 Filler Injection Procedure Note  Procedure:  Filler administration  Pre-operative Diagnosis: Rytides   Post-operative Diagnosis: Same  Surgeon: Electronically signed by: Estefana Reichert, DO   Complications:  None  Brief history: The patient desires injection with fillers in her face. I discussed with the patient this proposed procedure of filler injections, which is customized depending on the particular needs of the patient. It is performed on facial volume loss as a temporary correction. The alternatives were discussed with the patient. The risks were addressed including bleeding, scarring, infection, damage to deeper structures, asymmetry, and chronic pain, which may occur infrequently after a procedure. The individual's choice to undergo a surgical procedure is based on the comparison of risks to potential benefits. Other risks include unsatisfactory results, allergic reaction, which should go away with time, bruising and delayed healing. Fillers do not arrest the aging process or produce permanent tightening.  Operative intervention maybe necessary to maintain the results. The patient understands and wishes to proceed. An informed consent was signed and informational brochures given to her prior to the procedure.  Procedure: The area was prepped with chlorhexidine  and dried with a clean gauze. Using a clean technique, a 30 gauge needle was then used to inject the filler into the perioral area on each side. No complications were noted. Light pressure was held for 5 minutes. She was instructed explicitly in post-operative care.  Restylane Refyne 1 ml LOT: [id number]

## 2024-04-11 ENCOUNTER — Other Ambulatory Visit: Payer: Self-pay | Admitting: *Deleted

## 2024-04-11 DIAGNOSIS — C50411 Malignant neoplasm of upper-outer quadrant of right female breast: Secondary | ICD-10-CM

## 2024-04-12 ENCOUNTER — Inpatient Hospital Stay: Attending: Hematology and Oncology

## 2024-04-12 ENCOUNTER — Inpatient Hospital Stay: Admitting: Hematology and Oncology

## 2024-04-12 VITALS — BP 120/88 | HR 72 | Temp 97.7°F | Resp 18 | Ht 64.0 in | Wt 177.3 lb

## 2024-04-12 DIAGNOSIS — Z79811 Long term (current) use of aromatase inhibitors: Secondary | ICD-10-CM | POA: Insufficient documentation

## 2024-04-12 DIAGNOSIS — Z170 Estrogen receptor positive status [ER+]: Secondary | ICD-10-CM

## 2024-04-12 DIAGNOSIS — C50411 Malignant neoplasm of upper-outer quadrant of right female breast: Secondary | ICD-10-CM

## 2024-04-12 DIAGNOSIS — Z9221 Personal history of antineoplastic chemotherapy: Secondary | ICD-10-CM | POA: Insufficient documentation

## 2024-04-12 LAB — CBC WITH DIFFERENTIAL (CANCER CENTER ONLY)
Abs Immature Granulocytes: 0.01 K/uL (ref 0.00–0.07)
Basophils Absolute: 0 K/uL (ref 0.0–0.1)
Basophils Relative: 1 %
Eosinophils Absolute: 0.1 K/uL (ref 0.0–0.5)
Eosinophils Relative: 2 %
HCT: 39.7 % (ref 36.0–46.0)
Hemoglobin: 13.6 g/dL (ref 12.0–15.0)
Immature Granulocytes: 0 %
Lymphocytes Relative: 36 %
Lymphs Abs: 1.6 K/uL (ref 0.7–4.0)
MCH: 32.1 pg (ref 26.0–34.0)
MCHC: 34.3 g/dL (ref 30.0–36.0)
MCV: 93.6 fL (ref 80.0–100.0)
Monocytes Absolute: 0.5 K/uL (ref 0.1–1.0)
Monocytes Relative: 10 %
Neutro Abs: 2.3 K/uL (ref 1.7–7.7)
Neutrophils Relative %: 51 %
Platelet Count: 287 K/uL (ref 150–400)
RBC: 4.24 MIL/uL (ref 3.87–5.11)
RDW: 13.5 % (ref 11.5–15.5)
WBC Count: 4.4 K/uL (ref 4.0–10.5)
nRBC: 0 % (ref 0.0–0.2)

## 2024-04-12 LAB — CMP (CANCER CENTER ONLY)
ALT: 24 U/L (ref 0–44)
AST: 31 U/L (ref 15–41)
Albumin: 4.5 g/dL (ref 3.5–5.0)
Alkaline Phosphatase: 96 U/L (ref 38–126)
Anion gap: 9 (ref 5–15)
BUN: 8 mg/dL (ref 8–23)
CO2: 27 mmol/L (ref 22–32)
Calcium: 9.5 mg/dL (ref 8.9–10.3)
Chloride: 105 mmol/L (ref 98–111)
Creatinine: 1.05 mg/dL — ABNORMAL HIGH (ref 0.44–1.00)
GFR, Estimated: 58 mL/min — ABNORMAL LOW (ref >60)
Glucose, Bld: 97 mg/dL (ref 70–99)
Potassium: 4.1 mmol/L (ref 3.5–5.1)
Sodium: 141 mmol/L (ref 135–145)
Total Bilirubin: 0.6 mg/dL (ref 0.0–1.2)
Total Protein: 7.1 g/dL (ref 6.5–8.1)

## 2024-04-12 NOTE — Progress Notes (Signed)
 [facility] Cancer Follow up:    Brittany Lauraine BRAVO, NP [address] [place] [address]   DIAGNOSIS:  Cancer Staging  Malignant neoplasm of upper-outer quadrant of right breast in female, estrogen receptor positive (HCC) Staging form: Breast, AJCC 8th Edition - Pathologic stage from 05/31/2023: Stage IB (pT1c, pN2a, cM0, G2, ER+, PR+, HER2-) - Signed by Loretha Ash, MD on 05/31/2023 Stage prefix: Initial diagnosis Method of lymph node assessment: Axillary lymph node dissection Histologic grading system: 3 grade system    SUMMARY OF ONCOLOGIC HISTORY: Oncology History  Malignant neoplasm of upper-outer quadrant of right breast in female, estrogen receptor positive (HCC)  02/22/2023 Mammogram   Screening mammogram on February 22, 2023 showed possible mass in the right breast with calcifications warranting further evaluation.  No findings suspicious for malignancy in the left breast.  Diagnostic mammogram done confirmed a persistent irregular mass within the central right breast, adjacent to the mass are slightly heterogeneous calcifications.  Ultrasound showed 1.1 x 1.2 x 1.3 cm irregular hypoechoic mass at 10 o'clock position of the right breast 1 cm from the nipple, no abnormal right axillary lymph nodes are noted.   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  Prognostic showed ER 100% positive strong staining PR 95% positive strong staining Ki-67 of 5% and HER2 2+   03/14/2023 Initial Diagnosis   Malignant neoplasm of upper-outer quadrant of right breast in female, estrogen receptor positive (HCC)    Genetic Testing   [facility] CancerNext-Expanded Panel+RNA was Negative. Report date is 03/27/2023.   The CancerNext-Expanded gene panel offered by [facility] and includes sequencing, rearrangement, and RNA analysis for the  following 71 genes: AIP, ALK, APC, ATM, AXIN2, BAP1, BARD1, BMPR1A, BRCA1, BRCA2, BRIP1, CDC73, CDH1, CDK4, CDKN1B, CDKN2A, CHEK2, CTNNA1, DICER1, FH, FLCN, KIF1B, LZTR1, MAX, MEN1, MET, MLH1, MSH2, MSH3, MSH6, MUTYH, NF1, NF2, NTHL1, PALB2, PHOX2B, PMS2, POT1, PRKAR1A, PTCH1, PTEN, RAD51C, RAD51D, RB1, RET, SDHA, SDHAF2, SDHB, SDHC, SDHD, SMAD4, SMARCA4, SMARCB1, SMARCE1, STK11, SUFU, TMEM127, TP53, TSC1, TSC2, and VHL (sequencing and deletion/duplication); EGFR, EGLN1, HOXB13, KIT, MITF, PDGFRA, POLD1, and POLE (sequencing only); EPCAM and GREM1 (deletion/duplication only).    05/31/2023 Cancer Staging   Staging form: Breast, AJCC 8th Edition - Pathologic stage from 05/31/2023: Stage IB (pT1c, pN2a, cM0, G2, ER+, PR+, HER2-) - Signed by Loretha Ash, MD on 05/31/2023 Stage prefix: Initial diagnosis Method of lymph node assessment: Axillary lymph node dissection Histologic grading system: 3 grade system   06/09/2023 -  Chemotherapy   Patient is on Treatment Plan : BREAST DOSE DENSE AC q14d / PACLitaxel  q7d       CURRENT THERAPY: Radiation  INTERVAL HISTORY:  History of Present Illness  Brittany [name] is a 68 year old female who presents for a follow-up while on anastrozole .  She has a history of cancer and is currently on anastrozole , which she is tolerating well. She previously took Verzenio  for a week but discontinued it due to adverse effects including hair loss, diarrhea, stomach pain, sluggishness, and depression. Her hair started falling out again after one week of taking Verzenio , losing half of the regrown hair. She also experienced gastrointestinal side effects and a decrease in energy levels, which led her to stop the medication.  She is planning a trip to [place] and [place], which she is looking forward to as a  way to enjoy her improved health and energy. She is otherwise tolerating anastrozole  well.   Rest of the pertinent 10 point ROS reviewed and neg.  Patient  Active Problem List   Diagnosis Date Noted   Encounter for counseling 03/27/2024   Fatigue 01/26/2024   Osteoporosis screening 01/26/2024   Dysuria 01/26/2024   Port-A-Cath in place 05/31/2023   Genetic testing 03/28/2023   Family history of breast cancer 03/16/2023   Malignant neoplasm of upper-outer quadrant of right breast in female, estrogen receptor positive (HCC) 03/14/2023   Arthralgia of right knee 01/28/2023   Pneumococcal vaccination administered at current visit 01/28/2023   Decreased GFR 12/25/2021   Encounter for general adult medical examination with abnormal findings 07/23/2021   Overweight (BMI 25.0-29.9) 07/23/2021   Hyperlipidemia 07/23/2021   Prediabetes 07/23/2021   Ceruminosis, bilateral 02/18/2021   Vitamin D  deficiency 04/01/2020   Elevated LDL cholesterol level    Anxiety and depression 11/14/2017   Abnormal mammogram with microcalcification 11/14/2017   Thyroid  disorder 11/14/2017    is allergic to wellbutrin [bupropion].  MEDICAL HISTORY: Past Medical History:  Diagnosis Date   Allergy     Wellbutrin   Anxiety and depression    Elevated LDL cholesterol level    no meds   Elevated liver enzymes 07/23/2021   GERD (gastroesophageal reflux disease)    Malignant neoplasm of breast (female) (HCC)    Neuromuscular disorder (HCC) 2014   Neuropathy   Pre-diabetes    no meds, does not check blood sugar   Thyroid  disorder 11/14/2017    SURGICAL HISTORY: Past Surgical History:  Procedure Laterality Date   ABDOMINAL HYSTERECTOMY  03/2017   AXILLARY LYMPH NODE DISSECTION Right 05/18/2023   Procedure: RIGHT AXILLARY LYMPH NODE DISSECTION;  Surgeon: Aron Shoulders, MD;  Location: [facility] OR;  Service: General;  Laterality: Right;  PEC BLOCK   BREAST BIOPSY Right 03/09/2023   US  RT BREAST BX W LOC DEV 1ST LESION IMG BX SPEC US  GUIDE 03/09/2023 [facility] MAMMOGRAPHY   BREAST BIOPSY  04/12/2023   US  RT RADIOACTIVE SEED LOC 04/12/2023 [facility] ULTRASOUND   BREAST  BIOPSY Right 03/01/2024   MM RT BREAST BX W LOC DEV 1ST LESION IMAGE BX SPEC STEREO GUIDE 03/01/2024 [facility] MAMMOGRAPHY   BREAST LUMPECTOMY Right 04/2023   BREAST LUMPECTOMY WITH RADIOACTIVE SEED AND SENTINEL LYMPH NODE BIOPSY Right 04/13/2023   Procedure: RIGHT BREAST SEED LOCALIZED LUMPECTOMY WITH SENTINEL NODE BIOPSY;  Surgeon: Aron Shoulders, MD;  Location: [facility];  Service: General;  Laterality: Right;   COLONOSCOPY  06/08/2017   HERNIA REPAIR     LAPAROSCOPIC HYSTERECTOMY  03/2017   Partial, pt still has both adnexa   PORT-A-CATH REMOVAL N/A 02/08/2024   Procedure: REMOVAL PORT-A-CATH;  Surgeon: Aron Shoulders, MD;  Location: [facility] OR;  Service: General;  Laterality: N/A;   PORTACATH PLACEMENT N/A 05/18/2023   Procedure: PORT PLACEMENT WITH ULTRASOUND GUIDANCE;  Surgeon: Aron Shoulders, MD;  Location: [facility] OR;  Service: General;  Laterality: N/A;    SOCIAL HISTORY: Social History   Socioeconomic History   Marital status: Divorced    Spouse name: Not on file   Number of children: 1   Years of education: Not on file   Highest education level: Master's degree (e.g., MA, MS, MEng, MEd, MSW, MBA)  Occupational History   Occupation: RETIRED  Tobacco Use   Smoking status: Former    Types: Cigarettes   Smokeless tobacco: Never   Tobacco comments:    stopped at age  26. smoked for 4-5 years 1 ppd  Vaping Use   Vaping status: Never Used  Substance and Sexual Activity   Alcohol use: Yes    Comment: WINE OCC   Drug use: Never   Sexual activity: Not Currently    Partners: Male    Birth control/protection: Surgical    Comment: hysterectomy  Other Topics Concern   Not on file  Social History Narrative   Lives alone/2025   Social Drivers of Health   Financial Resource Strain: Low Risk  (01/22/2024)   Overall Financial Resource Strain (CARDIA)    Difficulty of Paying Living Expenses: Not very hard  Food Insecurity: No Food Insecurity (01/22/2024)   Hunger Vital Sign     Worried About Running Out of Food in the Last Year: Never true    Ran Out of Food in the Last Year: Never true  Transportation Needs: No Transportation Needs (01/22/2024)   PRAPARE - Administrator, Civil Service (Medical): No    Lack of Transportation (Non-Medical): No  Physical Activity: Insufficiently Active (01/22/2024)   Exercise Vital Sign    Days of Exercise per Week: 3 days    Minutes of Exercise per Session: 30 min  Stress: Stress Concern Present (01/22/2024)   [employer] of Occupational Health - Occupational Stress Questionnaire    Feeling of Stress: To some extent  Social Connections: Socially Isolated (01/22/2024)   Social Connection and Isolation Panel    Frequency of Communication with Friends and Family: Three times a week    Frequency of Social Gatherings with Friends and Family: Twice a week    Attends Religious Services: Never    Database Administrator or Organizations: No    Attends Engineer, Structural: Not on file    Marital Status: Divorced  Intimate Partner Violence: Patient Unable To Answer (01/26/2024)   Humiliation, Afraid, Rape, and Kick questionnaire    Fear of Current or Ex-Partner: Patient unable to answer    Emotionally Abused: Patient unable to answer    Physically Abused: Patient unable to answer    Sexually Abused: Patient unable to answer    FAMILY HISTORY: Family History  Problem Relation Age of Onset   Breast cancer Mother 69   Diabetes Mother    Hypertension Mother    Arthritis Mother    Cancer Mother    Depression Mother    Cancer Father 26 - 9       liver?   Alcohol abuse Father    Stroke Father    Diabetes Brother    Alcohol abuse Brother    Cancer Paternal Uncle    Breast cancer Paternal Grandmother 73   Varicose Veins Paternal Aunt        PHYSICAL EXAMINATION   Onc Performance Status - 04/12/24 1000       KPS SCALE   KPS % SCORE Able to carry on normal activity, minor s/s of disease            Vitals:   04/12/24 1000  BP: 120/88  Pulse: 72  Resp: 18  Temp: 97.7 F (36.5 C)  SpO2: 100%    She appears well, no acute distress No cervical adenopathy CTA bilaterally Bilateral breasts examined. No palpable regional adenopathy CTA bilaterally RRR No LE edema.  LABORATORY DATA:  CBC    Component Value Date/Time   WBC 4.4 04/12/2024 1019   WBC 5.1 01/26/2024 1200   RBC 4.24 04/12/2024 1019   HGB 13.6 04/12/2024  1019   HGB 13.4 04/01/2020 0912   HCT 39.7 04/12/2024 1019   HCT 39.0 04/01/2020 0912   PLT 287 04/12/2024 1019   PLT 331 04/01/2020 0912   MCV 93.6 04/12/2024 1019   MCV 93 04/01/2020 0912   MCH 32.1 04/12/2024 1019   MCHC 34.3 04/12/2024 1019   RDW 13.5 04/12/2024 1019   RDW 12.6 04/01/2020 0912   LYMPHSABS 1.6 04/12/2024 1019   LYMPHSABS 2.8 04/01/2020 0912   MONOABS 0.5 04/12/2024 1019   EOSABS 0.1 04/12/2024 1019   EOSABS 0.1 04/01/2020 0912   BASOSABS 0.0 04/12/2024 1019   BASOSABS 0.0 04/01/2020 0912    CMP     Component Value Date/Time   NA 139 01/26/2024 1200   NA 140 04/01/2020 0912   K 4.3 01/26/2024 1200   CL 105 01/26/2024 1200   CO2 28 01/26/2024 1200   GLUCOSE 86 01/26/2024 1200   BUN 12 01/26/2024 1200   BUN 10 04/01/2020 0912   CREATININE 0.86 01/26/2024 1200   CREATININE 0.77 11/04/2023 1007   CALCIUM 9.6 01/26/2024 1200   PROT 6.7 01/26/2024 1200   PROT 6.6 04/01/2020 0912   ALBUMIN 4.4 01/26/2024 1200   ALBUMIN 4.3 04/01/2020 0912   AST 26 01/26/2024 1200   AST 25 11/04/2023 1007   ALT 31 01/26/2024 1200   ALT 37 11/04/2023 1007   ALKPHOS 74 01/26/2024 1200   BILITOT 0.5 01/26/2024 1200   BILITOT 0.5 11/04/2023 1007   GFRNONAA >60 11/04/2023 1007   GFRAA 71 04/01/2020 0912     ASSESSMENT and THERAPY PLAN:    Assessment and Plan Assessment & Plan 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 Discontinued Verzenio  due to significant side effects. Prioritizes quality of life over recurrence risk. - Continue anastrozole . - Discuss ribociclib as an alternative to Verzenio  after the holidays. - I will call her in January to see if she wants to try ribociclib - Guardant reveal every 6 months.  Time spent: 30 min  *Total Encounter Time as defined by the Centers for Medicare and Medicaid Services includes, in addition to the face-to-face time of a patient visit (documented in the note above) non-face-to-face time: obtaining and reviewing outside history, ordering and reviewing medications, tests or procedures, care coordination (communications with other health care professionals or caregivers) and documentation in the medical record.

## 2024-04-16 ENCOUNTER — Telehealth: Payer: Self-pay | Admitting: Hematology and Oncology

## 2024-04-16 NOTE — Telephone Encounter (Signed)
 I spoke with patient to schedule telephone visit in January 2026. Patient aware of date/time.

## 2024-05-08 ENCOUNTER — Inpatient Hospital Stay

## 2024-05-08 ENCOUNTER — Inpatient Hospital Stay (HOSPITAL_BASED_OUTPATIENT_CLINIC_OR_DEPARTMENT_OTHER): Admitting: Adult Health

## 2024-05-08 ENCOUNTER — Encounter: Payer: Self-pay | Admitting: Adult Health

## 2024-05-08 VITALS — BP 140/89 | HR 71 | Temp 98.0°F | Resp 17 | Wt 183.5 lb

## 2024-05-08 DIAGNOSIS — Z170 Estrogen receptor positive status [ER+]: Secondary | ICD-10-CM | POA: Diagnosis not present

## 2024-05-08 DIAGNOSIS — C50411 Malignant neoplasm of upper-outer quadrant of right female breast: Secondary | ICD-10-CM | POA: Diagnosis not present

## 2024-05-08 NOTE — Progress Notes (Unsigned)
 SURVIVORSHIP VISIT:  BRIEF ONCOLOGIC HISTORY:  Oncology History  Malignant neoplasm of upper-outer quadrant of right breast in female, estrogen receptor positive (HCC)  02/22/2023 Mammogram   Screening mammogram on February 22, 2023 showed possible mass in the right breast with calcifications warranting further evaluation.  No findings suspicious for malignancy in the left breast.  Diagnostic mammogram done confirmed a persistent irregular mass within the central right breast, adjacent to the mass are slightly heterogeneous calcifications.  Ultrasound showed 1.1 x 1.2 x 1.3 cm irregular hypoechoic mass at 10 o'clock position of the right breast 1 cm from the nipple, no abnormal right axillary lymph nodes are noted.   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  Prognostic showed ER 100% positive strong staining PR 95% positive strong staining Ki-67 of 5% and HER2 2+   03/14/2023 Initial Diagnosis   Malignant neoplasm of upper-outer quadrant of right breast in female, estrogen receptor positive (HCC)    Genetic Testing   [facility] CancerNext-Expanded Panel+RNA was Negative. Report date is 03/27/2023.   The CancerNext-Expanded gene panel offered by [facility]  and includes sequencing, rearrangement, and RNA analysis for the following 71 genes: AIP, ALK, APC, ATM, AXIN2, BAP1, BARD1, BMPR1A, BRCA1, BRCA2, BRIP1, CDC73, CDH1, CDK4, CDKN1B, CDKN2A, CHEK2, CTNNA1, DICER1, FH, FLCN, KIF1B, LZTR1, MAX, MEN1, MET, MLH1, MSH2, MSH3, MSH6, MUTYH, NF1, NF2, NTHL1, PALB2, PHOX2B, PMS2, POT1, PRKAR1A, PTCH1, PTEN, RAD51C, RAD51D, RB1, RET, SDHA, SDHAF2, SDHB, SDHC, SDHD, SMAD4, SMARCA4, SMARCB1, SMARCE1, STK11, SUFU, TMEM127, TP53, TSC1, TSC2, and VHL (sequencing and deletion/duplication); EGFR, EGLN1, HOXB13, KIT, MITF, PDGFRA, POLD1, and POLE (sequencing only); EPCAM  and GREM1 (deletion/duplication only).    04/13/2023 Surgery   RIGHT BREAST LUMPECTOMY: grade II/III Invasive Lobular Carcinoma, LCIS; ER+/PR+/HER2-; 4/5 lymph nodes positive for carcinoma; right medial margin positive for invasive lobular carcinoma   05/31/2023 Cancer Staging   Staging form: Breast, AJCC 8th Edition - Pathologic stage from 05/31/2023: Stage IB (pT1c, pN2a, cM0, G2, ER+, PR+, HER2-) - Signed by Loretha Ash, MD on 05/31/2023 Stage prefix: Initial diagnosis Method of lymph node assessment: Axillary lymph node dissection Histologic grading system: 3 grade system   06/09/2023 -  Chemotherapy   Patient is on Treatment Plan : BREAST DOSE DENSE AC q14d / PACLitaxel  q7d     12/05/2023 - 01/20/2024 Radiation Therapy   Plan Name: Breast_R Site: Breast, Right Technique: 3D Mode: Photon Dose Per Fraction: 1.8 Gy Prescribed Dose (Delivered / Prescribed): 50.4 Gy / 50.4 Gy Prescribed Fxs (Delivered / Prescribed): 28 / 28   Plan Name: Breast_R_SCV Site: Breast, Right Technique: 3D Mode: Photon Dose Per Fraction: 1.8 Gy Prescribed Dose (Delivered / Prescribed): 50.4 Gy / 50.4 Gy Prescribed Fxs (Delivered / Prescribed): 28 / 28   Plan Name: Breast_R_Bst Site: Breast, Right Technique: 3D Mode: Photon Dose Per Fraction: 2 Gy Prescribed Dose (Delivered / Prescribed): 10 Gy / 10 Gy Prescribed Fxs (Delivered / Prescribed): 5 / 5   01/2024 -  Anti-estrogen oral therapy   Anastrozole      INTERVAL HISTORY:  Ms. Talerico to review her survivorship care plan detailing her treatment course for breast cancer, as well as monitoring long-term side effects of that treatment, education regarding health maintenance, screening, and overall wellness and health promotion.     Overall, Ms. Quinnell reports feeling quite well   REVIEW OF SYSTEMS:  Review of Systems -  Oncology Breast: Denies any new nodularity, masses, tenderness, nipple changes, or nipple discharge.       PAST  MEDICAL/SURGICAL HISTORY:  Past Medical History:  Diagnosis Date   Allergy     Wellbutrin   Anxiety and depression    Elevated LDL cholesterol level    no meds   Elevated liver enzymes 07/23/2021   GERD (gastroesophageal reflux disease)    Malignant neoplasm of breast (female) (HCC)    Neuromuscular disorder (HCC) 2014   Neuropathy   Pre-diabetes    no meds, does not check blood sugar   Thyroid  disorder 11/14/2017   Past Surgical History:  Procedure Laterality Date   ABDOMINAL HYSTERECTOMY  03/2017   AXILLARY LYMPH NODE DISSECTION Right 05/18/2023   Procedure: RIGHT AXILLARY LYMPH NODE DISSECTION;  Surgeon: Aron Shoulders, MD;  Location: [facility];  Service: General;  Laterality: Right;  PEC BLOCK   BREAST BIOPSY Right 03/09/2023   US  RT BREAST BX W LOC DEV 1ST LESION IMG BX SPEC US  GUIDE 03/09/2023 [facility] MAMMOGRAPHY   BREAST BIOPSY  04/12/2023   US  RT RADIOACTIVE SEED LOC 04/12/2023 [facility] ULTRASOUND   BREAST BIOPSY Right 03/01/2024   MM RT BREAST BX W LOC DEV 1ST LESION IMAGE BX SPEC STEREO GUIDE 03/01/2024 [facility] MAMMOGRAPHY   BREAST LUMPECTOMY Right 04/2023   BREAST LUMPECTOMY WITH RADIOACTIVE SEED AND SENTINEL LYMPH NODE BIOPSY Right 04/13/2023   Procedure: RIGHT BREAST SEED LOCALIZED LUMPECTOMY WITH SENTINEL NODE BIOPSY;  Surgeon: Aron Shoulders, MD;  Location: [facility];  Service: General;  Laterality: Right;   COLONOSCOPY  06/08/2017   HERNIA REPAIR     LAPAROSCOPIC HYSTERECTOMY  03/2017   Partial, pt still has both adnexa   PORT-A-CATH REMOVAL N/A 02/08/2024   Procedure: REMOVAL PORT-A-CATH;  Surgeon: Aron Shoulders, MD;  Location: [facility] OR;  Service: General;  Laterality: N/A;   PORTACATH PLACEMENT N/A 05/18/2023   Procedure: PORT PLACEMENT WITH ULTRASOUND GUIDANCE;  Surgeon: Aron Shoulders, MD;  Location: [facility] OR;  Service: General;  Laterality: N/A;     ALLERGIES:  Allergies[1]   CURRENT MEDICATIONS:  Outpatient Encounter Medications  as of 05/08/2024  Medication Sig   pyridoxine (B-6) 100 MG tablet Take 100 mg by mouth daily.   vitamin B-12 (CYANOCOBALAMIN ) 500 MCG tablet Take 500 mcg by mouth daily.   VITAMIN D  PO Take 1 tablet by mouth daily.   VITAMIN K PO Take by mouth.   Alpha Lipoic Acid 200 MG CAPS Take 300 mg by mouth daily.   anastrozole  (ARIMIDEX ) 1 MG tablet Take 1 tablet (1 mg total) by mouth daily.   ascorbic acid (VITAMIN C) 500 MG tablet Take 500 mg by mouth daily.   EVENING PRIMROSE OIL PO Take 1 tablet by mouth daily.   MAGNESIUM GLYCINATE PO Take 350 mg by mouth daily.   Methylsulfonylmethane (MSM) 1000 MG TABS Take 1 tablet by mouth 3 (three) times a week.   milk thistle 175 MG tablet Take 175 mg by mouth daily.   OIL OF OREGANO PO Take by mouth as needed (leg cramps).   [DISCONTINUED] abemaciclib  (VERZENIO ) 100 MG tablet Take 1 tablet (100 mg total) by mouth 2 (two) times daily.   No facility-administered encounter medications on file as of 05/08/2024.     ONCOLOGIC FAMILY HISTORY:  Family History  Problem Relation Age of Onset   Breast cancer Mother 13   Diabetes Mother    Hypertension Mother    Arthritis Mother    Cancer Mother  Depression Mother    Cancer Father 62 - 18       liver?   Alcohol abuse Father    Stroke Father    Diabetes Brother    Alcohol abuse Brother    Cancer Paternal Uncle    Breast cancer Paternal Grandmother 82   Varicose Veins Paternal Aunt      SOCIAL HISTORY:  Social History   Socioeconomic History   Marital status: Divorced    Spouse name: Not on file   Number of children: 1   Years of education: Not on file   Highest education level: Master's degree (e.g., MA, MS, MEng, MEd, MSW, MBA)  Occupational History   Occupation: RETIRED  Tobacco Use   Smoking status: Former    Types: Cigarettes   Smokeless tobacco: Never   Tobacco comments:    stopped at age 73. smoked for 4-5 years 1 ppd  Vaping Use   Vaping  status: Never Used  Substance and Sexual Activity   Alcohol use: Yes    Comment: WINE OCC   Drug use: Never   Sexual activity: Not Currently    Partners: Male    Birth control/protection: Surgical    Comment: hysterectomy  Other Topics Concern   Not on file  Social History Narrative   Lives alone/2025   Social Drivers of Health   Tobacco Use: Medium Risk (05/08/2024)   Patient History    Smoking Tobacco Use: Former    Smokeless Tobacco Use: Never    Passive Exposure: Not on Actuary Strain: Low Risk (01/22/2024)   Overall Financial Resource Strain (CARDIA)    Difficulty of Paying Living Expenses: Not very hard  Food Insecurity: No Food Insecurity (01/22/2024)   Epic    Worried About Programme Researcher, Broadcasting/film/video in the Last Year: Never true    Ran Out of Food in the Last Year: Never true  Transportation Needs: No Transportation Needs (01/22/2024)   Epic    Lack of Transportation (Medical): No    Lack of Transportation (Non-Medical): No  Physical Activity: Insufficiently Active (01/22/2024)   Exercise Vital Sign    Days of Exercise per Week: 3 days    Minutes of Exercise per Session: 30 min  Stress: Stress Concern Present (01/22/2024)   [employer] of Occupational Health - Occupational Stress Questionnaire    Feeling of Stress: To some extent  Social Connections: Socially Isolated (01/22/2024)   Social Connection and Isolation Panel    Frequency of Communication with Friends and Family: Three times a week    Frequency of Social Gatherings with Friends and Family: Twice a week    Attends Religious Services: Never    Database Administrator or Organizations: No    Attends Engineer, Structural: Not on file    Marital Status: Divorced  Intimate Partner Violence: Patient Unable To Answer (01/26/2024)   Epic    Fear of Current or Ex-Partner: Patient unable to answer    Emotionally Abused: Patient unable to answer    Physically Abused:  Patient unable to answer    Sexually Abused: Patient unable to answer  Depression (PHQ2-9): Low Risk (05/08/2024)   Depression (PHQ2-9)    PHQ-2 Score: 0  Alcohol Screen: Low Risk (01/22/2024)   Alcohol Screen    Last Alcohol Screening Score (AUDIT): 1  Housing: Low Risk (01/22/2024)   Epic    Unable to Pay for Housing in the Last Year: No    Number of Times Moved  in the Last Year: 0    Homeless in the Last Year: No  Utilities: Not At Risk (11/15/2023)   Epic    Threatened with loss of utilities: No  Health Literacy: Not on file     OBSERVATIONS/OBJECTIVE:  BP (!) 140/89 (BP Location: Left Arm, Patient Position: Sitting)   Pulse 71   Temp 98 F (36.7 C) (Temporal)   Resp 17   Wt 183 lb 8 oz (83.2 kg)   SpO2 97%   BMI 31.50 kg/m  GENERAL: Patient is a well appearing female in no acute distress HEENT:  Sclerae anicteric.  Oropharynx clear and moist. No ulcerations or evidence of oropharyngeal candidiasis. Neck is supple.  NODES:  No cervical, supraclavicular, or axillary lymphadenopathy palpated.  BREAST EXAM:  Deferred. LUNGS:  Clear to auscultation bilaterally.  No wheezes or rhonchi. HEART:  Regular rate and rhythm. No murmur appreciated. ABDOMEN:  Soft, nontender.  Positive, normoactive bowel sounds. No organomegaly palpated. MSK:  No focal spinal tenderness to palpation. Full range of motion bilaterally in the upper extremities. EXTREMITIES:  No peripheral edema.   SKIN:  Clear with no obvious rashes or skin changes. No nail dyscrasia. NEURO:  Nonfocal. Well oriented.  Appropriate affect.   LABORATORY DATA:  None for this visit.  DIAGNOSTIC IMAGING:  None for this visit.      ASSESSMENT AND PLAN:  Ms.. Leis is a pleasant 68 y.o. female with Stage *** right/left breast invasive ductal carcinoma, ER+/PR+/HER2-, diagnosed in ***, treated with lumpectomy, adjuvant radiation therapy, and anti-estrogen therapy with *** beginning in ***.  She presents to the  Survivorship Clinic for our initial meeting and routine follow-up post-completion of treatment for breast cancer.    1. Stage *** right/left breast cancer:  Ms. Passon is continuing to recover from definitive treatment for breast cancer. She will follow-up with her medical oncologist, Dr.  PIERRETTE with history and physical exam per surveillance protocol.  She will continue her anti-estrogen therapy with ***. Thus far, she is tolerating the *** well, with minimal side effects. Her mammogram is due ***; orders placed today.   Today, a comprehensive survivorship care plan and treatment summary was reviewed with the patient today detailing her breast cancer diagnosis, treatment course, potential late/long-term effects of treatment, appropriate follow-up care with recommendations for the future, and patient education resources.  A copy of this summary, along with a letter will be sent to the patients primary care provider via mail/fax/In Basket message after todays visit.    #. Problem(s) at Visit______________  #. Bone health:  Given Ms. Zarazua's age/history of breast cancer and her current treatment regimen including anti-estrogen therapy with ***, she is at risk for bone demineralization.  Her last DEXA scan was ***, which showed ***.  In the meantime, she was encouraged to increase her consumption of foods rich in calcium, as well as increase her weight-bearing activities.  She was given education on specific activities to promote bone health.  #. Cancer screening:  Due to Ms. Petrilla's history and her age, she should receive screening for skin cancers, colon cancer, and gynecologic cancers.  The information and recommendations are listed on the patient's comprehensive care plan/treatment summary and were reviewed in detail with the patient.    #. Health maintenance and wellness promotion: Ms. Mcmanaway was encouraged to consume 5-7 servings of fruits and vegetables per day. We reviewed the  Nutrition Rainbow handout.  She was also encouraged to engage in moderate to vigorous exercise for 30  minutes per day most days of the week.  She was instructed to limit her alcohol consumption and continue to abstain from tobacco use/***was encouraged stop smoking.     #. Support services/counseling: It is not uncommon for this period of the patient's cancer care trajectory to be one of many emotions and stressors.   She was given information regarding our available services and encouraged to contact me with any questions or for help enrolling in any of our support group/programs.    Follow up instructions:    -Return to cancer center ***  -Mammogram due in *** -She is welcome to return back to the Survivorship Clinic at any time; no additional follow-up needed at this time.  -Consider referral back to survivorship as a long-term survivor for continued surveillance  The patient was provided an opportunity to ask questions and all were answered. The patient agreed with the plan and demonstrated an understanding of the instructions.   Total encounter time:*** minutes*in face-to-face visit time, chart review, lab review, care coordination, order entry, and documentation of the encounter time.    Morna Kendall, NP 05/08/2024 10:49 AM Medical Oncology and Hematology [facility] [address] [place], [place] [address] Tel. [phone]    Fax. [phone]  *Total Encounter Time as defined by the Centers for Medicare and Medicaid Services includes, in addition to the face-to-face time of a patient visit (documented in the note above) non-face-to-face time: obtaining and reviewing outside history, ordering and reviewing medications, tests or procedures, care coordination (communications with other health care professionals or caregivers) and documentation in the medical record.      [1] Allergies Allergen Reactions   Wellbutrin [Bupropion] Hives

## 2024-05-16 ENCOUNTER — Encounter: Payer: Self-pay | Admitting: Hematology and Oncology

## 2024-05-17 LAB — GUARDANT REVEAL

## 2024-05-18 ENCOUNTER — Ambulatory Visit: Payer: Self-pay | Admitting: Hematology and Oncology

## 2024-05-22 NOTE — Progress Notes (Unsigned)
" ° °  Brittany [name] 08-01-1955 [id number]   History:  69 y.o. G1P1001 presents for breast and pelvic exam. S/P hysterectomy for recurrent benign endometrial polyps. Postmenopausal - no HRT. Normal pap history. 05/2023 ER+/PR+ right breast cancer, currently on antiestrogen therapy (anastrozole ). Negative genetic testing. Normal pelvic ultrasound 03/27/24.   Gynecologic History No LMP recorded. Patient has had a hysterectomy.   Contraception: status post hysterectomy Sexually active: No  Health Maintenance Last Pap: 04/28/2020. Results were: Normal Last mammogram: 02/29/2024. Results were: Right breast calcifications, biopsy - BENIGN FIBROADIPOSE SHOWING FAT NECROSIS WITH FIBROSIS AND CALCIFICATIONS NEGATIVE FOR MAMMARY DUCTS AND LOBULES NEGATIVE FOR MALIGNANCY. Last colonoscopy: 11/15/2017. Results were: Normal, 10-year recall Last Dexa: 08/05/2020. Results were: Normal  Past medical history, past surgical history, family history and social history were all reviewed and documented in the EPIC chart. Single. Retired. Daughter moved to [place] for work.   ROS:  A ROS was performed and pertinent positives and negatives are included.  Exam:  Vitals:   05/23/24 0846  BP: 110/70  Pulse: 82  SpO2: 99%  Weight: 177 lb (80.3 kg)  Height: 5' 4.25 (1.632 m)   Body mass index is 30.15 kg/m. Physical Exam Exam conducted with a chaperone present.  Constitutional:      Appearance: Normal appearance.  Neck:     Thyroid : No thyroid  mass, thyromegaly or thyroid  tenderness.  Cardiovascular:     Rate and Rhythm: Normal rate and regular rhythm.  Pulmonary:     Effort: Pulmonary effort is normal.     Breath sounds: Normal breath sounds.  Chest:  Breasts:    Right: Normal.     Left: Normal.  Abdominal:     Palpations: Abdomen is soft.     Tenderness: There is no abdominal tenderness.  Genitourinary:    General: Normal vulva.     Vagina: Normal.     Cervix: Normal.     Uterus:  Absent.      Adnexa: Right adnexa normal and left adnexa normal.     Rectum: Normal.  Lymphadenopathy:     Upper Body:     Right upper body: No supraclavicular or axillary adenopathy.     Left upper body: No supraclavicular or axillary adenopathy.     [name], CMA present as chaperone.   Assessment/Plan:  69 y.o. G1P1001 for breast and pelvic exam.   Encounter for breast and pelvic examination - Education provided on SBEs, importance of preventative screenings, current guidelines, high calcium diet, regular exercise, and multivitamin daily. Labs with PCP.   Postmenopausal - No HRT. S/P hysterectomy for benign reasons.   Malignant neoplasm of upper-outer quadrant of right breast in female, estrogen receptor positive [facility]) - 05/2023 ER+/PR+ right breast cancer, currently on antiestrogen therapy (anastrozole ). Negative genetic testing.  Screening for cervical cancer - Normal Pap history.  No longer screening per guidelines.   Screening for colon cancer - 2019 colonoscopy. Will repeat at 10-year interval per GI's recommendation.   Screening for osteoporosis - Normal bone density in 2022. DXA scheduled 08/2024.   Return in about 1 year (around 05/23/2025) for B&P (high risk).    Brittany DELENA Shutter DNP, 9:11 AM 05/23/2024  "

## 2024-05-23 ENCOUNTER — Encounter: Payer: Self-pay | Admitting: Nurse Practitioner

## 2024-05-23 ENCOUNTER — Ambulatory Visit: Admitting: Nurse Practitioner

## 2024-05-23 VITALS — BP 110/70 | HR 82 | Ht 64.25 in | Wt 177.0 lb

## 2024-05-23 DIAGNOSIS — Z01419 Encounter for gynecological examination (general) (routine) without abnormal findings: Secondary | ICD-10-CM

## 2024-05-23 DIAGNOSIS — Z853 Personal history of malignant neoplasm of breast: Secondary | ICD-10-CM

## 2024-05-23 DIAGNOSIS — Z9189 Other specified personal risk factors, not elsewhere classified: Secondary | ICD-10-CM | POA: Diagnosis not present

## 2024-05-23 DIAGNOSIS — Z780 Asymptomatic menopausal state: Secondary | ICD-10-CM

## 2024-05-23 DIAGNOSIS — Z9289 Personal history of other medical treatment: Secondary | ICD-10-CM

## 2024-05-23 DIAGNOSIS — Z170 Estrogen receptor positive status [ER+]: Secondary | ICD-10-CM

## 2024-05-23 DIAGNOSIS — C50411 Malignant neoplasm of upper-outer quadrant of right female breast: Secondary | ICD-10-CM

## 2024-05-24 ENCOUNTER — Inpatient Hospital Stay: Attending: Hematology and Oncology | Admitting: Hematology and Oncology

## 2024-05-24 VITALS — BP 111/77 | HR 88 | Temp 97.3°F | Resp 17 | Wt 177.9 lb

## 2024-05-24 DIAGNOSIS — Z79811 Long term (current) use of aromatase inhibitors: Secondary | ICD-10-CM | POA: Insufficient documentation

## 2024-05-24 DIAGNOSIS — Z9221 Personal history of antineoplastic chemotherapy: Secondary | ICD-10-CM | POA: Insufficient documentation

## 2024-05-24 DIAGNOSIS — Z170 Estrogen receptor positive status [ER+]: Secondary | ICD-10-CM | POA: Insufficient documentation

## 2024-05-24 DIAGNOSIS — Z17411 Hormone receptor positive with human epidermal growth factor receptor 2 negative status: Secondary | ICD-10-CM | POA: Insufficient documentation

## 2024-05-24 DIAGNOSIS — Z923 Personal history of irradiation: Secondary | ICD-10-CM | POA: Diagnosis not present

## 2024-05-24 DIAGNOSIS — Z1721 Progesterone receptor positive status: Secondary | ICD-10-CM | POA: Insufficient documentation

## 2024-05-24 DIAGNOSIS — Z87891 Personal history of nicotine dependence: Secondary | ICD-10-CM | POA: Diagnosis not present

## 2024-05-24 DIAGNOSIS — C50411 Malignant neoplasm of upper-outer quadrant of right female breast: Secondary | ICD-10-CM | POA: Insufficient documentation

## 2024-05-24 NOTE — Progress Notes (Signed)
 [facility] Cancer Follow up:    Brittany Lauraine BRAVO, NP [address] [place] [address]   DIAGNOSIS:  Cancer Staging  Malignant neoplasm of upper-outer quadrant of right breast in female, estrogen receptor positive (HCC) Staging form: Breast, AJCC 8th Edition - Pathologic stage from 05/31/2023: Stage IB (pT1c, pN2a, cM0, G2, ER+, PR+, HER2-) - Signed by Loretha Ash, MD on 05/31/2023 Stage prefix: Initial diagnosis Method of lymph node assessment: Axillary lymph node dissection Histologic grading system: 3 grade system    SUMMARY OF ONCOLOGIC HISTORY: Oncology History  Malignant neoplasm of upper-outer quadrant of right breast in female, estrogen receptor positive (HCC)  02/22/2023 Mammogram   Screening mammogram on February 22, 2023 showed possible mass in the right breast with calcifications warranting further evaluation.  No findings suspicious for malignancy in the left breast.  Diagnostic mammogram done confirmed a persistent irregular mass within the central right breast, adjacent to the mass are slightly heterogeneous calcifications.  Ultrasound showed 1.1 x 1.2 x 1.3 cm irregular hypoechoic mass at 10 o'clock position of the right breast 1 cm from the nipple, no abnormal right axillary lymph nodes are noted.   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  Prognostic showed ER 100% positive strong staining PR 95% positive strong staining Ki-67 of 5% and HER2 2+   03/14/2023 Initial Diagnosis   Malignant neoplasm of upper-outer quadrant of right breast in female, estrogen receptor positive (HCC)    Genetic Testing   [facility] CancerNext-Expanded Panel+RNA was Negative. Report date is 03/27/2023.   The CancerNext-Expanded gene panel offered by [facility] and includes sequencing, rearrangement, and RNA analysis for the  following 71 genes: AIP, ALK, APC, ATM, AXIN2, BAP1, BARD1, BMPR1A, BRCA1, BRCA2, BRIP1, CDC73, CDH1, CDK4, CDKN1B, CDKN2A, CHEK2, CTNNA1, DICER1, FH, FLCN, KIF1B, LZTR1, MAX, MEN1, MET, MLH1, MSH2, MSH3, MSH6, MUTYH, NF1, NF2, NTHL1, PALB2, PHOX2B, PMS2, POT1, PRKAR1A, PTCH1, PTEN, RAD51C, RAD51D, RB1, RET, SDHA, SDHAF2, SDHB, SDHC, SDHD, SMAD4, SMARCA4, SMARCB1, SMARCE1, STK11, SUFU, TMEM127, TP53, TSC1, TSC2, and VHL (sequencing and deletion/duplication); EGFR, EGLN1, HOXB13, KIT, MITF, PDGFRA, POLD1, and POLE (sequencing only); EPCAM and GREM1 (deletion/duplication only).    04/13/2023 Surgery   RIGHT BREAST LUMPECTOMY: grade II/III Invasive Lobular Carcinoma, LCIS; ER+/PR+/HER2-; 4/5 lymph nodes positive for carcinoma; right medial margin positive for invasive lobular carcinoma   05/31/2023 Cancer Staging   Staging form: Breast, AJCC 8th Edition - Pathologic stage from 05/31/2023: Stage IB (pT1c, pN2a, cM0, G2, ER+, PR+, HER2-) - Signed by Loretha Ash, MD on 05/31/2023 Stage prefix: Initial diagnosis Method of lymph node assessment: Axillary lymph node dissection Histologic grading system: 3 grade system   06/09/2023 - 11/04/2023 Chemotherapy   Patient is on Treatment Plan : BREAST DOSE DENSE AC q14d / PACLitaxel  q7d     12/05/2023 - 01/20/2024 Radiation Therapy   Plan Name: Breast_R Site: Breast, Right Technique: 3D Mode: Photon Dose Per Fraction: 1.8 Gy Prescribed Dose (Delivered / Prescribed): 50.4 Gy / 50.4 Gy Prescribed Fxs (Delivered / Prescribed): 28 / 28   Plan Name: Breast_R_SCV Site: Breast, Right Technique: 3D Mode: Photon Dose Per Fraction: 1.8 Gy Prescribed Dose (Delivered / Prescribed): 50.4 Gy / 50.4 Gy Prescribed Fxs (Delivered / Prescribed): 28 / 28   Plan Name: Breast_R_Bst Site: Breast, Right Technique: 3D Mode: Photon Dose Per Fraction: 2 Gy Prescribed Dose (Delivered /  Prescribed): 10 Gy / 10 Gy Prescribed Fxs (Delivered / Prescribed): 5 / 5   01/2024 -   Anti-estrogen oral therapy   Anastrozole      CURRENT THERAPY: Radiation  INTERVAL HISTORY:  History of Present Illness  Brittany [name] is a 69 year old female with hormone receptor-positive breast cancer in remission who presents for routine oncology follow-up and surveillance.  She remains on anastrozole  for endocrine therapy and has declined a switch to ribociclib, preferring to continue her current regimen. She recently underwent a Gardant Reveal liquid biopsy, which showed no evidence of tumor.  She continues to experience upper limb lymphedema, persistent since September or October of the previous year. She previously attended physical therapy, but use of compression sleeves was delayed due to insurance and provider communication issues. She recently initiated an herbal tincture (cleavers) and reports substantial improvement in swelling, soreness, and fluid retention of the right arm. She is not currently using compression wraps due to impracticality.  She expresses concern regarding weight gain and has joined a gym, though has not yet attended due to flu season. She plans to begin exercising soon and intends to walk weekly to address weight and overall health.  She recently underwent tooth extraction for a chronic dental infection and is currently taking amoxicillin . She reports improvement in symptoms since the extraction but believes the infection may persist and anticipates further dental intervention if needed.  Rest of the pertinent 10 point ROS reviewed and neg.  Patient Active Problem List   Diagnosis Date Noted   Encounter for counseling 03/27/2024   Fatigue 01/26/2024   Osteoporosis screening 01/26/2024   Dysuria 01/26/2024   Genetic testing 03/28/2023   Family history of breast cancer 03/16/2023   Malignant neoplasm of upper-outer quadrant of right breast in female, estrogen receptor positive (HCC) 03/14/2023   Arthralgia of right knee 01/28/2023   Pneumococcal  vaccination administered at current visit 01/28/2023   Decreased GFR 12/25/2021   Encounter for general adult medical examination with abnormal findings 07/23/2021   Overweight (BMI 25.0-29.9) 07/23/2021   Hyperlipidemia 07/23/2021   Prediabetes 07/23/2021   Ceruminosis, bilateral 02/18/2021   Vitamin D  deficiency 04/01/2020   Elevated LDL cholesterol level    Anxiety and depression 11/14/2017   Abnormal mammogram with microcalcification 11/14/2017   Thyroid  disorder 11/14/2017    is allergic to wellbutrin [bupropion].  MEDICAL HISTORY: Past Medical History:  Diagnosis Date   Allergy     Wellbutrin   Anxiety and depression    Elevated LDL cholesterol level    no meds   Elevated liver enzymes 07/23/2021   GERD (gastroesophageal reflux disease) 2014   GERD   Malignant neoplasm of breast (female) (HCC)    Neuromuscular disorder (HCC) 2014   Neuropathy   Port-A-Cath in place 05/31/2023   Pre-diabetes    no meds, does not check blood sugar   Thyroid  disorder 11/14/2017    SURGICAL HISTORY: Past Surgical History:  Procedure Laterality Date   ABDOMINAL HYSTERECTOMY  03/2017   AXILLARY LYMPH NODE DISSECTION Right 05/18/2023   Procedure: RIGHT AXILLARY LYMPH NODE DISSECTION;  Surgeon: Aron Shoulders, MD;  Location: [facility] OR;  Service: General;  Laterality: Right;  PEC BLOCK   BREAST BIOPSY Right 03/09/2023   US  RT BREAST BX W LOC DEV 1ST LESION IMG BX SPEC US  GUIDE 03/09/2023 [facility] MAMMOGRAPHY   BREAST BIOPSY  04/12/2023   US  RT RADIOACTIVE SEED LOC 04/12/2023 [facility] ULTRASOUND   BREAST BIOPSY Right 03/01/2024   MM RT BREAST BX W  LOC DEV 1ST LESION IMAGE BX SPEC STEREO GUIDE 03/01/2024 [facility] MAMMOGRAPHY   BREAST LUMPECTOMY Right 04/2023   BREAST LUMPECTOMY WITH RADIOACTIVE SEED AND SENTINEL LYMPH NODE BIOPSY Right 04/13/2023   Procedure: RIGHT BREAST SEED LOCALIZED LUMPECTOMY WITH SENTINEL NODE BIOPSY;  Surgeon: Aron Shoulders, MD;  Location: [facility];  Service:  General;  Laterality: Right;   COLONOSCOPY  06/08/2017   HERNIA REPAIR     LAPAROSCOPIC HYSTERECTOMY  03/2017   Partial, pt still has both adnexa   PORT-A-CATH REMOVAL N/A 02/08/2024   Procedure: REMOVAL PORT-A-CATH;  Surgeon: Aron Shoulders, MD;  Location: [facility] OR;  Service: General;  Laterality: N/A;   PORTACATH PLACEMENT N/A 05/18/2023   Procedure: PORT PLACEMENT WITH ULTRASOUND GUIDANCE;  Surgeon: Aron Shoulders, MD;  Location: [facility] OR;  Service: General;  Laterality: N/A;    SOCIAL HISTORY: Social History   Socioeconomic History   Marital status: Divorced    Spouse name: Not on file   Number of children: 1   Years of education: Not on file   Highest education level: Master's degree (e.g., MA, MS, MEng, MEd, MSW, MBA)  Occupational History   Occupation: RETIRED  Tobacco Use   Smoking status: Former    Types: Cigarettes   Smokeless tobacco: Never   Tobacco comments:    stopped at age 64. smoked for 4-5 years 1 ppd  Vaping Use   Vaping status: Never Used  Substance and Sexual Activity   Alcohol use: Yes    Comment: WINE OCC   Drug use: Never   Sexual activity: Not Currently    Partners: Male    Birth control/protection: Surgical    Comment: hysterectomy, older than 16, more than 5  Other Topics Concern   Not on file  Social History Narrative   Lives alone/2025   Social Drivers of Health   Tobacco Use: Medium Risk (05/23/2024)   Patient History    Smoking Tobacco Use: Former    Smokeless Tobacco Use: Never    Passive Exposure: Not on file  Financial Resource Strain: Low Risk (01/22/2024)   Overall Financial Resource Strain (CARDIA)    Difficulty of Paying Living Expenses: Not very hard  Food Insecurity: No Food Insecurity (01/22/2024)   Epic    Worried About Programme Researcher, Broadcasting/film/video in the Last Year: Never true    Ran Out of Food in the Last Year: Never true  Transportation Needs: No Transportation Needs (01/22/2024)   Epic    Lack of Transportation (Medical): No    Lack  of Transportation (Non-Medical): No  Physical Activity: Insufficiently Active (01/22/2024)   Exercise Vital Sign    Days of Exercise per Week: 3 days    Minutes of Exercise per Session: 30 min  Stress: Stress Concern Present (01/22/2024)   [employer] of Occupational Health - Occupational Stress Questionnaire    Feeling of Stress: To some extent  Social Connections: Socially Isolated (01/22/2024)   Social Connection and Isolation Panel    Frequency of Communication with Friends and Family: Three times a week    Frequency of Social Gatherings with Friends and Family: Twice a week    Attends Religious Services: Never    Database Administrator or Organizations: No    Attends Banker Meetings: Not on file    Marital Status: Divorced  Intimate Partner Violence: Patient Unable To Answer (01/26/2024)   Epic    Fear of Current or Ex-Partner: Patient unable to answer    Emotionally  Abused: Patient unable to answer    Physically Abused: Patient unable to answer    Sexually Abused: Patient unable to answer  Depression (PHQ2-9): Low Risk (05/08/2024)   Depression (PHQ2-9)    PHQ-2 Score: 0  Alcohol Screen: Low Risk (01/22/2024)   Alcohol Screen    Last Alcohol Screening Score (AUDIT): 1  Housing: Low Risk (01/22/2024)   Epic    Unable to Pay for Housing in the Last Year: No    Number of Times Moved in the Last Year: 0    Homeless in the Last Year: No  Utilities: Not At Risk (11/15/2023)   Epic    Threatened with loss of utilities: No  Health Literacy: Not on file    FAMILY HISTORY: Family History  Problem Relation Age of Onset   Breast cancer Mother 25   Diabetes Mother    Hypertension Mother    Arthritis Mother    Cancer Mother    Depression Mother    Cancer Father 52 - 47       liver?   Alcohol abuse Father    Stroke Father    Diabetes Brother    Alcohol abuse Brother    Cancer Paternal Uncle    Breast cancer Paternal Grandmother 20   Varicose Veins Paternal  Aunt        PHYSICAL EXAMINATION   Vitals:   05/24/24 0914  BP: 111/77  Pulse: 88  Resp: 17  Temp: (!) 97.3 F (36.3 C)  SpO2: 98%    She appears well, no acute distress No cervical adenopathy CTA bilaterally Bilateral breasts examined. No palpable regional adenopathy CTA bilaterally RRR No LE edema.  LABORATORY DATA:  CBC    Component Value Date/Time   WBC 4.4 04/12/2024 1019   WBC 5.1 01/26/2024 1200   RBC 4.24 04/12/2024 1019   HGB 13.6 04/12/2024 1019   HGB 13.4 04/01/2020 0912   HCT 39.7 04/12/2024 1019   HCT 39.0 04/01/2020 0912   PLT 287 04/12/2024 1019   PLT 331 04/01/2020 0912   MCV 93.6 04/12/2024 1019   MCV 93 04/01/2020 0912   MCH 32.1 04/12/2024 1019   MCHC 34.3 04/12/2024 1019   RDW 13.5 04/12/2024 1019   RDW 12.6 04/01/2020 0912   LYMPHSABS 1.6 04/12/2024 1019   LYMPHSABS 2.8 04/01/2020 0912   MONOABS 0.5 04/12/2024 1019   EOSABS 0.1 04/12/2024 1019   EOSABS 0.1 04/01/2020 0912   BASOSABS 0.0 04/12/2024 1019   BASOSABS 0.0 04/01/2020 0912    CMP     Component Value Date/Time   NA 141 04/12/2024 1019   NA 140 04/01/2020 0912   K 4.1 04/12/2024 1019   CL 105 04/12/2024 1019   CO2 27 04/12/2024 1019   GLUCOSE 97 04/12/2024 1019   BUN 8 04/12/2024 1019   BUN 10 04/01/2020 0912   CREATININE 1.05 (H) 04/12/2024 1019   CALCIUM 9.5 04/12/2024 1019   PROT 7.1 04/12/2024 1019   PROT 6.6 04/01/2020 0912   ALBUMIN 4.5 04/12/2024 1019   ALBUMIN 4.3 04/01/2020 0912   AST 31 04/12/2024 1019   ALT 24 04/12/2024 1019   ALKPHOS 96 04/12/2024 1019   BILITOT 0.6 04/12/2024 1019   GFRNONAA 58 (L) 04/12/2024 1019   GFRAA 71 04/01/2020 0912     ASSESSMENT and THERAPY PLAN:    Assessment & Plan Stage 1B ER/PR-positive invasive lobular breast cancer with lymph node involvement  Malignant neoplasm of upper-outer quadrant of right breast Currently on anastrozole  with  good tolerance. Discontinued Verzenio  due to significant side effects.  -  Discussed switch to ribociclib, she didn't want to try this. - Monitor with Gardant Reveal testing every six months. - Scheduled follow-up visits every six months. - No routine laboratory monitoring required for anastrozole .  Lymphedema of left upper limb Chronic lymphedema improved with cleavers herbal tincture, not using compression wraps, self-managing symptoms. - Encouraged physical activity as tolerated, including gym attendance and walking. - Start using sleeve when delivered.  Time spent: 30 min  *Total Encounter Time as defined by the Centers for Medicare and Medicaid Services includes, in addition to the face-to-face time of a patient visit (documented in the note above) non-face-to-face time: obtaining and reviewing outside history, ordering and reviewing medications, tests or procedures, care coordination (communications with other health care professionals or caregivers) and documentation in the medical record.

## 2024-05-28 ENCOUNTER — Telehealth: Payer: Self-pay

## 2024-05-28 ENCOUNTER — Telehealth: Payer: Self-pay | Admitting: Hematology and Oncology

## 2024-05-28 NOTE — Telephone Encounter (Signed)
 Error

## 2024-05-28 NOTE — Telephone Encounter (Signed)
 I left voicemail for patient regarding lab and MD on 11/22/2024.

## 2024-06-12 ENCOUNTER — Encounter: Payer: Self-pay | Admitting: Internal Medicine

## 2024-06-12 ENCOUNTER — Ambulatory Visit: Payer: Self-pay

## 2024-06-12 ENCOUNTER — Ambulatory Visit: Admitting: Internal Medicine

## 2024-06-12 VITALS — BP 124/78 | HR 62 | Temp 98.4°F | Ht 64.25 in | Wt 179.0 lb

## 2024-06-12 DIAGNOSIS — J069 Acute upper respiratory infection, unspecified: Secondary | ICD-10-CM

## 2024-06-12 DIAGNOSIS — H1031 Unspecified acute conjunctivitis, right eye: Secondary | ICD-10-CM

## 2024-06-12 MED ORDER — POLYMYXIN B-TRIMETHOPRIM 10000-0.1 UNIT/ML-% OP SOLN: 1.0000 [drp] | OPHTHALMIC | 0 refills | Status: AC

## 2024-06-12 NOTE — Patient Instructions (Addendum)
" ° ° ° ° ° ° °  Medications changes include :   eye drops x 5-7 days       Return if symptoms worsen or fail to improve.  "

## 2024-08-13 ENCOUNTER — Other Ambulatory Visit (HOSPITAL_BASED_OUTPATIENT_CLINIC_OR_DEPARTMENT_OTHER)

## 2024-11-22 ENCOUNTER — Inpatient Hospital Stay

## 2024-11-22 ENCOUNTER — Inpatient Hospital Stay: Admitting: Hematology and Oncology

## 2025-01-30 ENCOUNTER — Ambulatory Visit

## 2025-05-27 ENCOUNTER — Encounter: Admitting: Nurse Practitioner
